# Patient Record
Sex: Male | Born: 1960 | Race: Black or African American | Hispanic: No | Marital: Single | Smoking: Current every day smoker
Health system: Southern US, Community
[De-identification: ages and names within clinical notes are randomized; demographics above are authoritative.]

## PROBLEM LIST (undated history)

## (undated) ENCOUNTER — Emergency Department (HOSPITAL_COMMUNITY): Payer: Medicare (Managed Care)

## (undated) DIAGNOSIS — I10 Essential (primary) hypertension: Secondary | ICD-10-CM

## (undated) DIAGNOSIS — N200 Calculus of kidney: Secondary | ICD-10-CM

## (undated) DIAGNOSIS — F209 Schizophrenia, unspecified: Secondary | ICD-10-CM

## (undated) DIAGNOSIS — E785 Hyperlipidemia, unspecified: Secondary | ICD-10-CM

## (undated) DIAGNOSIS — N189 Chronic kidney disease, unspecified: Secondary | ICD-10-CM

## (undated) DIAGNOSIS — K8020 Calculus of gallbladder without cholecystitis without obstruction: Secondary | ICD-10-CM

## (undated) DIAGNOSIS — I219 Acute myocardial infarction, unspecified: Secondary | ICD-10-CM

## (undated) DIAGNOSIS — I739 Peripheral vascular disease, unspecified: Secondary | ICD-10-CM

## (undated) DIAGNOSIS — E119 Type 2 diabetes mellitus without complications: Secondary | ICD-10-CM

## (undated) DIAGNOSIS — I2510 Atherosclerotic heart disease of native coronary artery without angina pectoris: Secondary | ICD-10-CM

## (undated) DIAGNOSIS — Z87448 Personal history of other diseases of urinary system: Secondary | ICD-10-CM

## (undated) HISTORY — DX: Type 2 diabetes mellitus without complications: E11.9

## (undated) HISTORY — DX: Essential (primary) hypertension: I10

## (undated) HISTORY — DX: Calculus of gallbladder without cholecystitis without obstruction: K80.20

## (undated) HISTORY — PX: OTHER SURGICAL HISTORY: SHX169

## (undated) HISTORY — DX: Calculus of kidney: N20.0

---

## 2007-07-06 ENCOUNTER — Other Ambulatory Visit: Payer: Self-pay

## 2007-07-07 ENCOUNTER — Other Ambulatory Visit: Payer: Self-pay

## 2007-07-07 ENCOUNTER — Inpatient Hospital Stay: Payer: Self-pay | Admitting: Vascular Surgery

## 2011-06-21 ENCOUNTER — Emergency Department: Payer: Self-pay | Admitting: Emergency Medicine

## 2011-06-21 LAB — COMPREHENSIVE METABOLIC PANEL
Anion Gap: 11 (ref 7–16)
BUN: 19 mg/dL — ABNORMAL HIGH (ref 7–18)
Bilirubin,Total: 0.5 mg/dL (ref 0.2–1.0)
Chloride: 105 mmol/L (ref 98–107)
Co2: 23 mmol/L (ref 21–32)
Creatinine: 1.34 mg/dL — ABNORMAL HIGH (ref 0.60–1.30)
EGFR (African American): >60
Osmolality: 283 (ref 275–301)
Potassium: 3.9 mmol/L (ref 3.5–5.1)
SGOT(AST): 13 U/L — ABNORMAL LOW (ref 15–37)
Sodium: 139 mmol/L (ref 136–145)
Total Protein: 7.5 g/dL (ref 6.4–8.2)

## 2011-06-21 LAB — CBC
HCT: 51.6 % (ref 40.0–52.0)
MCH: 29.5 pg (ref 26.0–34.0)
RDW: 15.6 % — ABNORMAL HIGH (ref 11.5–14.5)
WBC: 12.3 10*3/uL — ABNORMAL HIGH (ref 3.8–10.6)

## 2011-06-21 LAB — URINALYSIS, COMPLETE
Bilirubin,UR: NEGATIVE
Ph: 5 (ref 4.5–8.0)
Protein: 100
RBC,UR: 4 /HPF (ref 0–5)
Specific Gravity: 1.019 (ref 1.003–1.030)

## 2013-07-08 ENCOUNTER — Emergency Department: Payer: Self-pay | Admitting: Emergency Medicine

## 2013-07-08 LAB — TROPONIN I: Troponin-I: <0.02 ng/mL

## 2013-07-08 LAB — COMPREHENSIVE METABOLIC PANEL
ALT: 19 U/L (ref 12–78)
Albumin: 3.7 g/dL (ref 3.4–5.0)
Alkaline Phosphatase: 95 U/L
Anion Gap: 7 (ref 7–16)
BUN: 19 mg/dL — AB (ref 7–18)
Bilirubin,Total: 0.4 mg/dL (ref 0.2–1.0)
CHLORIDE: 108 mmol/L — AB (ref 98–107)
CREATININE: 1.46 mg/dL — AB (ref 0.60–1.30)
Calcium, Total: 9.2 mg/dL (ref 8.5–10.1)
Co2: 24 mmol/L (ref 21–32)
EGFR (African American): >60
GFR CALC NON AF AMER: 54 — AB
GLUCOSE: 63 mg/dL — AB (ref 65–99)
Osmolality: 278 (ref 275–301)
POTASSIUM: 3.9 mmol/L (ref 3.5–5.1)
SGOT(AST): 29 U/L (ref 15–37)
SODIUM: 139 mmol/L (ref 136–145)
Total Protein: 7.7 g/dL (ref 6.4–8.2)

## 2013-07-08 LAB — CBC WITH DIFFERENTIAL/PLATELET
Basophil #: 0.1 10*3/uL (ref 0.0–0.1)
Basophil %: 0.5 %
Eosinophil #: 0 10*3/uL (ref 0.0–0.7)
Eosinophil %: 0 %
HCT: 49.7 % (ref 40.0–52.0)
HGB: 16.8 g/dL (ref 13.0–18.0)
Lymphocyte #: 2.5 10*3/uL (ref 1.0–3.6)
Lymphocyte %: 19.8 %
MCH: 30.2 pg (ref 26.0–34.0)
MCHC: 33.8 g/dL (ref 32.0–36.0)
MCV: 90 fL (ref 80–100)
MONO ABS: 0.8 x10 3/mm (ref 0.2–1.0)
Monocyte %: 6.1 %
NEUTROS PCT: 73.6 %
Neutrophil #: 9.4 10*3/uL — ABNORMAL HIGH (ref 1.4–6.5)
Platelet: 175 10*3/uL (ref 150–440)
RBC: 5.55 10*6/uL (ref 4.40–5.90)
RDW: 16.2 % — ABNORMAL HIGH (ref 11.5–14.5)
WBC: 12.8 10*3/uL — ABNORMAL HIGH (ref 3.8–10.6)

## 2013-07-08 LAB — URINALYSIS, COMPLETE
Bilirubin,UR: NEGATIVE
Glucose,UR: NEGATIVE mg/dL (ref 0–75)
Ketone: NEGATIVE
NITRITE: NEGATIVE
Ph: 5 (ref 4.5–8.0)
Protein: NEGATIVE
RBC,UR: 1 /HPF (ref 0–5)
SPECIFIC GRAVITY: 1.01 (ref 1.003–1.030)
SQUAMOUS EPITHELIAL: NONE SEEN
WBC UR: 55 /HPF (ref 0–5)

## 2013-07-08 LAB — LIPASE, BLOOD: Lipase: 147 U/L (ref 73–393)

## 2013-11-20 ENCOUNTER — Encounter (HOSPITAL_COMMUNITY): Payer: Self-pay | Admitting: Emergency Medicine

## 2013-11-20 ENCOUNTER — Ambulatory Visit (HOSPITAL_COMMUNITY): Payer: Medicare Other

## 2013-11-20 ENCOUNTER — Encounter (HOSPITAL_COMMUNITY)
Admission: EM | Disposition: A | Discharge status: Home / Self Care | Payer: Medicare Other | Source: Other Acute Inpatient Hospital | Attending: Interventional Cardiology

## 2013-11-20 ENCOUNTER — Inpatient Hospital Stay (HOSPITAL_COMMUNITY)
Admission: EM | Admit: 2013-11-20 | Discharge: 2013-11-23 | DRG: 247 | Disposition: A | Discharge status: Home / Self Care | Payer: Medicare Other | Source: Other Acute Inpatient Hospital | Attending: Interventional Cardiology | Admitting: Interventional Cardiology

## 2013-11-20 DIAGNOSIS — Z7982 Long term (current) use of aspirin: Secondary | ICD-10-CM | POA: Diagnosis not present

## 2013-11-20 DIAGNOSIS — I2582 Chronic total occlusion of coronary artery: Secondary | ICD-10-CM | POA: Diagnosis present

## 2013-11-20 DIAGNOSIS — R6250 Unspecified lack of expected normal physiological development in childhood: Secondary | ICD-10-CM | POA: Diagnosis present

## 2013-11-20 DIAGNOSIS — I213 ST elevation (STEMI) myocardial infarction of unspecified site: Secondary | ICD-10-CM | POA: Diagnosis present

## 2013-11-20 DIAGNOSIS — I2510 Atherosclerotic heart disease of native coronary artery without angina pectoris: Secondary | ICD-10-CM | POA: Diagnosis present

## 2013-11-20 DIAGNOSIS — I498 Other specified cardiac arrhythmias: Secondary | ICD-10-CM | POA: Diagnosis present

## 2013-11-20 DIAGNOSIS — F17200 Nicotine dependence, unspecified, uncomplicated: Secondary | ICD-10-CM | POA: Diagnosis present

## 2013-11-20 DIAGNOSIS — I10 Essential (primary) hypertension: Secondary | ICD-10-CM | POA: Diagnosis present

## 2013-11-20 DIAGNOSIS — Z794 Long term (current) use of insulin: Secondary | ICD-10-CM | POA: Diagnosis not present

## 2013-11-20 DIAGNOSIS — E119 Type 2 diabetes mellitus without complications: Secondary | ICD-10-CM | POA: Diagnosis present

## 2013-11-20 DIAGNOSIS — F209 Schizophrenia, unspecified: Secondary | ICD-10-CM | POA: Diagnosis present

## 2013-11-20 DIAGNOSIS — I739 Peripheral vascular disease, unspecified: Secondary | ICD-10-CM | POA: Diagnosis present

## 2013-11-20 DIAGNOSIS — Z880 Allergy status to penicillin: Secondary | ICD-10-CM

## 2013-11-20 DIAGNOSIS — I2129 ST elevation (STEMI) myocardial infarction involving other sites: Principal | ICD-10-CM

## 2013-11-20 DIAGNOSIS — E785 Hyperlipidemia, unspecified: Secondary | ICD-10-CM | POA: Diagnosis present

## 2013-11-20 DIAGNOSIS — S78119A Complete traumatic amputation at level between unspecified hip and knee, initial encounter: Secondary | ICD-10-CM

## 2013-11-20 DIAGNOSIS — R079 Chest pain, unspecified: Secondary | ICD-10-CM | POA: Diagnosis present

## 2013-11-20 DIAGNOSIS — I2119 ST elevation (STEMI) myocardial infarction involving other coronary artery of inferior wall: Secondary | ICD-10-CM | POA: Diagnosis present

## 2013-11-20 DIAGNOSIS — Z720 Tobacco use: Secondary | ICD-10-CM | POA: Diagnosis present

## 2013-11-20 DIAGNOSIS — Z23 Encounter for immunization: Secondary | ICD-10-CM | POA: Diagnosis not present

## 2013-11-20 HISTORY — DX: Atherosclerotic heart disease of native coronary artery without angina pectoris: I25.10

## 2013-11-20 HISTORY — DX: Peripheral vascular disease, unspecified: I73.9

## 2013-11-20 HISTORY — DX: Hyperlipidemia, unspecified: E78.5

## 2013-11-20 HISTORY — DX: Personal history of other diseases of urinary system: Z87.448

## 2013-11-20 HISTORY — DX: Schizophrenia, unspecified: F20.9

## 2013-11-20 HISTORY — PX: LEFT HEART CATHETERIZATION WITH CORONARY ANGIOGRAM: SHX5451

## 2013-11-20 LAB — CBC
HCT: 42.6 % (ref 39.0–52.0)
Hemoglobin: 14.9 g/dL (ref 13.0–17.0)
MCH: 29.2 pg (ref 26.0–34.0)
MCHC: 35 g/dL (ref 30.0–36.0)
MCV: 83.5 fL (ref 78.0–100.0)
PLATELETS: 219 10*3/uL (ref 150–400)
RBC: 5.1 MIL/uL (ref 4.22–5.81)
RDW: 15.1 % (ref 11.5–15.5)
WBC: 13.9 10*3/uL — AB (ref 4.0–10.5)

## 2013-11-20 LAB — POCT I-STAT, CHEM 8
BUN: 17 mg/dL (ref 6–23)
CREATININE: 1.1 mg/dL (ref 0.50–1.35)
Calcium, Ion: 1.22 mmol/L (ref 1.12–1.23)
Chloride: 105 mEq/L (ref 96–112)
Glucose, Bld: 225 mg/dL — ABNORMAL HIGH (ref 70–99)
HCT: 47 % (ref 39.0–52.0)
HEMOGLOBIN: 16 g/dL (ref 13.0–17.0)
POTASSIUM: 3.5 meq/L — AB (ref 3.7–5.3)
SODIUM: 138 meq/L (ref 137–147)
TCO2: 22 mmol/L (ref 0–100)

## 2013-11-20 LAB — GLUCOSE, CAPILLARY: Glucose-Capillary: 145 mg/dL — ABNORMAL HIGH (ref 70–99)

## 2013-11-20 LAB — CREATININE, SERUM
CREATININE: 1.02 mg/dL (ref 0.50–1.35)
GFR calc Af Amer: >90 mL/min (ref >90)
GFR calc non Af Amer: 81 mL/min — ABNORMAL LOW (ref >90)

## 2013-11-20 LAB — I-STAT TROPONIN, ED: TROPONIN I, POC: 26.34 ng/mL — AB (ref 0.00–0.08)

## 2013-11-20 LAB — POCT ACTIVATED CLOTTING TIME
ACTIVATED CLOTTING TIME: 309 s
Activated Clotting Time: 214 seconds

## 2013-11-20 LAB — MRSA PCR SCREENING: MRSA BY PCR: NEGATIVE

## 2013-11-20 LAB — TROPONIN I: Troponin I: 19.6 ng/mL (ref <0.30)

## 2013-11-20 SURGERY — LEFT HEART CATHETERIZATION WITH CORONARY ANGIOGRAM
Anesthesia: LOCAL

## 2013-11-20 MED ORDER — CLOZAPINE 25 MG PO TABS
150.0000 mg | ORAL_TABLET | Freq: Every morning | ORAL | Status: DC
  Administered 2013-11-21 – 2013-11-23 (×3): 150 mg via ORAL
  Filled 2013-11-20 (×3): qty 2

## 2013-11-20 MED ORDER — ACETAMINOPHEN 325 MG PO TABS: 650.0000 mg | ORAL_TABLET | ORAL | Status: DC | PRN

## 2013-11-20 MED ORDER — HEPARIN SODIUM (PORCINE) 5000 UNIT/ML IJ SOLN
5000.0000 [IU] | Freq: Three times a day (TID) | INTRAMUSCULAR | Status: DC
  Administered 2013-11-21: 5000 [IU] via SUBCUTANEOUS
  Filled 2013-11-20 (×3): qty 1

## 2013-11-20 MED ORDER — MIDAZOLAM HCL 2 MG/2ML IJ SOLN
INTRAMUSCULAR | Status: AC
  Filled 2013-11-20: qty 2

## 2013-11-20 MED ORDER — SODIUM CHLORIDE 0.9 % IV SOLN
INTRAVENOUS | Status: AC
  Administered 2013-11-20: 18:00:00 via INTRAVENOUS

## 2013-11-20 MED ORDER — CLOZAPINE 100 MG PO TABS: 150.0000 mg | ORAL_TABLET | Freq: Two times a day (BID) | ORAL | Status: DC

## 2013-11-20 MED ORDER — TICAGRELOR 90 MG PO TABS
ORAL_TABLET | ORAL | Status: AC
  Filled 2013-11-20: qty 2

## 2013-11-20 MED ORDER — NITROGLYCERIN 0.4 MG SL SUBL: 0.4000 mg | SUBLINGUAL_TABLET | SUBLINGUAL | Status: DC | PRN

## 2013-11-20 MED ORDER — BENZTROPINE MESYLATE 1 MG PO TABS
1.0000 mg | ORAL_TABLET | Freq: Every day | ORAL | Status: DC
  Administered 2013-11-20 – 2013-11-22 (×3): 1 mg via ORAL
  Filled 2013-11-20 (×4): qty 1

## 2013-11-20 MED ORDER — ACETAMINOPHEN 325 MG PO TABS
650.0000 mg | ORAL_TABLET | ORAL | Status: DC | PRN
  Administered 2013-11-21 (×2): 650 mg via ORAL
  Filled 2013-11-20 (×2): qty 2

## 2013-11-20 MED ORDER — AMLODIPINE BESYLATE 2.5 MG PO TABS
2.5000 mg | ORAL_TABLET | Freq: Every day | ORAL | Status: DC
  Administered 2013-11-20: 2.5 mg via ORAL
  Filled 2013-11-20 (×2): qty 1

## 2013-11-20 MED ORDER — HEPARIN (PORCINE) IN NACL 2-0.9 UNIT/ML-% IJ SOLN
INTRAMUSCULAR | Status: AC
  Filled 2013-11-20: qty 500

## 2013-11-20 MED ORDER — INSULIN ASPART 100 UNIT/ML ~~LOC~~ SOLN
10.0000 [IU] | Freq: Three times a day (TID) | SUBCUTANEOUS | Status: DC
  Administered 2013-11-21 – 2013-11-22 (×4): 10 [IU] via SUBCUTANEOUS

## 2013-11-20 MED ORDER — TAMSULOSIN HCL 0.4 MG PO CAPS
0.4000 mg | ORAL_CAPSULE | Freq: Every day | ORAL | Status: DC
  Administered 2013-11-20 – 2013-11-23 (×4): 0.4 mg via ORAL
  Filled 2013-11-20 (×4): qty 1

## 2013-11-20 MED ORDER — PANTOPRAZOLE SODIUM 40 MG PO TBEC
40.0000 mg | DELAYED_RELEASE_TABLET | Freq: Every day | ORAL | Status: DC
  Administered 2013-11-20 – 2013-11-23 (×4): 40 mg via ORAL
  Filled 2013-11-20 (×4): qty 1

## 2013-11-20 MED ORDER — CLOZAPINE 100 MG PO TABS
500.0000 mg | ORAL_TABLET | Freq: Every evening | ORAL | Status: DC
  Administered 2013-11-20 – 2013-11-22 (×3): 500 mg via ORAL
  Filled 2013-11-20 (×4): qty 5

## 2013-11-20 MED ORDER — TIROFIBAN HCL IV 5 MG/100ML
0.1500 ug/kg/min | INTRAVENOUS | Status: AC
  Administered 2013-11-20: 0.15 ug/kg/min via INTRAVENOUS
  Filled 2013-11-20: qty 100

## 2013-11-20 MED ORDER — ATORVASTATIN CALCIUM 80 MG PO TABS
80.0000 mg | ORAL_TABLET | Freq: Every day | ORAL | Status: DC
  Administered 2013-11-21 – 2013-11-23 (×3): 80 mg via ORAL
  Filled 2013-11-20 (×3): qty 1

## 2013-11-20 MED ORDER — TIROFIBAN HCL IV 5 MG/100ML
INTRAVENOUS | Status: AC
  Filled 2013-11-20: qty 100

## 2013-11-20 MED ORDER — DOCUSATE SODIUM 100 MG PO CAPS
100.0000 mg | ORAL_CAPSULE | Freq: Every day | ORAL | Status: DC
  Administered 2013-11-21 – 2013-11-23 (×3): 100 mg via ORAL
  Filled 2013-11-20 (×3): qty 1

## 2013-11-20 MED ORDER — VERAPAMIL HCL 2.5 MG/ML IV SOLN
INTRAVENOUS | Status: AC
  Filled 2013-11-20: qty 2

## 2013-11-20 MED ORDER — INSULIN ASPART 100 UNIT/ML ~~LOC~~ SOLN
0.0000 [IU] | Freq: Three times a day (TID) | SUBCUTANEOUS | Status: DC
  Administered 2013-11-21: 2 [IU] via SUBCUTANEOUS
  Administered 2013-11-22: 3 [IU] via SUBCUTANEOUS
  Administered 2013-11-23: 5 [IU] via SUBCUTANEOUS

## 2013-11-20 MED ORDER — CLONAZEPAM 1 MG PO TABS
1.0000 mg | ORAL_TABLET | Freq: Every day | ORAL | Status: DC
  Administered 2013-11-20 – 2013-11-22 (×3): 1 mg via ORAL
  Filled 2013-11-20 (×3): qty 1

## 2013-11-20 MED ORDER — ONDANSETRON HCL 4 MG/2ML IJ SOLN: 4.0000 mg | Freq: Four times a day (QID) | INTRAMUSCULAR | Status: DC | PRN

## 2013-11-20 MED ORDER — HEPARIN SODIUM (PORCINE) 1000 UNIT/ML IJ SOLN
INTRAMUSCULAR | Status: AC
Start: 2013-11-20 — End: 2013-11-20
  Filled 2013-11-20: qty 1

## 2013-11-20 MED ORDER — MAGNESIUM HYDROXIDE 400 MG/5ML PO SUSP
30.0000 mL | Freq: Every day | ORAL | Status: DC | PRN
  Administered 2013-11-20: 30 mL via ORAL
  Filled 2013-11-20: qty 30

## 2013-11-20 MED ORDER — DULOXETINE HCL 60 MG PO CPEP
60.0000 mg | ORAL_CAPSULE | Freq: Every day | ORAL | Status: DC
  Administered 2013-11-20 – 2013-11-23 (×4): 60 mg via ORAL
  Filled 2013-11-20 (×4): qty 1

## 2013-11-20 MED ORDER — TICAGRELOR 90 MG PO TABS
90.0000 mg | ORAL_TABLET | Freq: Two times a day (BID) | ORAL | Status: DC
  Administered 2013-11-21 – 2013-11-23 (×5): 90 mg via ORAL
  Filled 2013-11-20 (×6): qty 1

## 2013-11-20 MED ORDER — INSULIN DETEMIR 100 UNIT/ML ~~LOC~~ SOLN
35.0000 [IU] | Freq: Every day | SUBCUTANEOUS | Status: DC
  Administered 2013-11-20 – 2013-11-21 (×2): 35 [IU] via SUBCUTANEOUS
  Filled 2013-11-20 (×4): qty 0.35

## 2013-11-20 MED ORDER — GEMFIBROZIL 600 MG PO TABS: 600.0000 mg | ORAL_TABLET | Freq: Two times a day (BID) | ORAL | Status: DC

## 2013-11-20 MED ORDER — PNEUMOCOCCAL VAC POLYVALENT 25 MCG/0.5ML IJ INJ
0.5000 mL | INJECTION | INTRAMUSCULAR | Status: AC
  Administered 2013-11-21: 0.5 mL via INTRAMUSCULAR
  Filled 2013-11-20: qty 0.5

## 2013-11-20 MED ORDER — METOPROLOL TARTRATE 12.5 MG HALF TABLET
12.5000 mg | ORAL_TABLET | Freq: Two times a day (BID) | ORAL | Status: DC
  Administered 2013-11-20 – 2013-11-23 (×6): 12.5 mg via ORAL
  Filled 2013-11-20 (×7): qty 1

## 2013-11-20 MED ORDER — LATANOPROST 0.005 % OP SOLN
1.0000 [drp] | Freq: Every day | OPHTHALMIC | Status: DC
  Administered 2013-11-20 – 2013-11-22 (×3): 1 [drp] via OPHTHALMIC
  Filled 2013-11-20: qty 2.5

## 2013-11-20 MED ORDER — FENTANYL CITRATE 0.05 MG/ML IJ SOLN
INTRAMUSCULAR | Status: AC
  Filled 2013-11-20: qty 2

## 2013-11-20 MED ORDER — INFLUENZA VAC SPLIT QUAD 0.5 ML IM SUSY
0.5000 mL | PREFILLED_SYRINGE | INTRAMUSCULAR | Status: AC
  Administered 2013-11-21: 0.5 mL via INTRAMUSCULAR
  Filled 2013-11-20: qty 0.5

## 2013-11-20 MED ORDER — ASPIRIN 81 MG PO CHEW
81.0000 mg | CHEWABLE_TABLET | Freq: Every day | ORAL | Status: DC
  Administered 2013-11-21 – 2013-11-23 (×3): 81 mg via ORAL
  Filled 2013-11-20 (×3): qty 1

## 2013-11-20 NOTE — CV Procedure (Addendum)
PROCEDURE:  Left heart catheterization with selective coronary angiography, Aspiration thrombectomy of the RCA; PCI RCA.  INDICATIONS:  Lateral STEMI  Emergency consent was obtained.  PROCEDURE TECHNIQUE:  After Xylocaine anesthesia a 22F slender sheath was placed in the right radial artery with a single anterior needle wall stick.  IV heparin was given. Right coronary angiography was done using a Judkins R4 guide catheter.  Left coronary angiography was done using an EBU 3.0 guide catheter.  Intervention performed. Please see below for details.  Left heart catheterization was done using the JR 4 catheter.  A TR band was used for hemostasis.  Of note, due to the patient's mental status and equivocal ECG, the patient record more evaluation in the emergency room before determining that this was an acute MI. This delayed the time before he can be brought to the Cath Lab.    CONTRAST:  Total of 110 cc.  COMPLICATIONS:  None.    HEMODYNAMICS:  Aortic pressure was 104/73; LV pressure was 106/12; LVEDP 17.  There was no gradient between the left ventricle and aorta.    ANGIOGRAPHIC DATA:   The left main coronary artery is widely patent.  The left anterior descending artery is a large vessel which wraps around the apex. There is minimal atherosclerosis in the LAD. There is a medium-sized first diagonal. There is one small branch off of the first diagonal which appears occluded, and fills by faint left to left collaterals.  The left circumflex artery is a large vessel. There is mild atherosclerosis in the mid vessel. There several small obtuse marginal vessels. There is one large, branching obtuse marginal vessel which is widely patent.  The right coronary artery is occluded in the midportion. After revascularization, it was noted that there is a medium-sized posterolateral artery. In a medium to large size posterior descending artery, both of which were widely patent.  LEFT VENTRICULOGRAM:   Left ventricular angiogram was not done.  LVEDP was 17 mmHg.  PCI NARRATIVE: A JR 4 guiding catheter was used to engage the RCA. A pro-water wire was placed across the area disease in the mid RCA. IV heparin and eventually IV tirofiban were used for anticoagulation. ACT was used to check that the anticoagulation was therapeutic. A priority 1 aspiration catheter was used to perform aspiration thrombectomy. There is successful removal of thrombus and restoration of flow. A 2.5 x 20 balloon was used to predilate. A 2.75 x 28 promus drug-eluting stent was deployed across the area disease. The stent was post dilated with a 3.25 x 15 noncompliant balloon. There is no residual stenosis.  Several doses of nitroglycerin were given. The patient tolerated the procedure well.  IMPRESSIONS:  1. Normal left main coronary artery. 2. Widely patent left anterior descending artery.  Trivial diagonal branch occluded. 3. Mild atherosclerosis in the left circumflex artery and its branches. 4. Occluded mid right coronary artery which was the culprit for his presentation. This was successfully treated with a 2.75 x 28 Promus drug-eluting stent, postdilated to 3.3 mm in diameter. 5. Left ventricular systolic function not assessed.  LVEDP 17 mmHg.    RECOMMENDATION:  Continue dual antiplatelet therapy for at least a year. He'll need aggressive secondary prevention including beta blocker, statin. He will be watched in the hospital for several days. We'll have to get his home medications restarted.  He will need cardiology followup. He has seen Dr. Christella Noa at [facility] many years ago, but apparently has not  seen anyone regularly.  Will need to determine followup based on where he is currently staying.

## 2013-11-20 NOTE — Progress Notes (Signed)
Dr. Eldridge Dace in room to see pt. Assessed right radial TR band and site. Site level 1 with small hematoma. Area soft- manual pressure held for 10 mins. Site softer. TR band in place with 15cc. 1+ radial pulse.

## 2013-11-20 NOTE — ED Notes (Signed)
PER EMS, patient has CP that started x2 weeks ago, ems reports elevation in lead 1 and AVL. Given 324 ASA and 1 nitro. Denies chest pain on arrival.

## 2013-11-20 NOTE — ED Notes (Signed)
Pt. Reports birthday is 05/26/1960, however insurance and facility paperwork state patient was born on Sep 30, 1960. Due to patient confusion, patient records will use the birthday of 1960/06/30.

## 2013-11-20 NOTE — H&P (Addendum)
Patient ID: Oscar Elliott MRN: [id number], DOB/AGE: [birth year]   Admit date: 11/20/2013   Primary Physician: No PCP Per Patient Primary Cardiologist: new - J. Eldridge Dace, MD   Pt. Profile:  53 y/o male w/o prior cardiac hx who presented with lateral STEMI.  Problem List  Past Medical History  Diagnosis Date  . Diabetes mellitus without complication   . Schizophrenia   . Hypertension   . CAD (coronary artery disease)     a. 10/2013 Lateral STEMI/PCI: LM nl, LAD min irregs, D1 occluded sub branch, LCX min irregs mid, OM large/patent, RCA 134m (2.75x28 Promus DES).  . Tobacco abuse   . Hyperlipidemia   . Peripheral vascular disease     a. 06/2007: s/p L AKA @ [facility]  . History of renal failure     a. 06/2007: in setting of rhabdo post L AKA.    Past Surgical History  Procedure Laterality Date  . Amputation Left     left above knee amputation     Allergies  Allergies  Allergen Reactions  . Penicillins Other (See Comments)    Reaction unknown    HPI  53 y/o male w/ the above problem list.  He does not have a prior cardiac history.  He has a h/o schizophrenia and lives in a group home.  Over the past few days, he has had severe 'indigestion,' which worsened today.  EMS was called and ECG was abnormal with lateral ST elevation.  Code STEMI was called and pt was taken to the cath lab where cath revealed an occluded RCA.  This was successfully stented with a DES.  Home Medications  Prior to Admission medications   Medication Sig Start Date End Date Taking? Authorizing Provider  amLODipine (NORVASC) 2.5 MG tablet Take 2.5 mg by mouth daily.   Yes Historical Provider, MD  aspirin EC 81 MG tablet Take 81 mg by mouth daily.   Yes Historical Provider, MD  atenolol (TENORMIN) 50 MG tablet Take 50 mg by mouth daily.   Yes Historical Provider, MD  benztropine (COGENTIN) 1 MG tablet Take 1 mg by mouth at bedtime.   Yes Historical Provider, MD  clonazePAM (KLONOPIN) 1 MG tablet Take  1 mg by mouth at bedtime.   Yes Historical Provider, MD  cloZAPine (CLOZARIL) 100 MG tablet Take 150-500 mg by mouth 2 (two) times daily. Take  by mouth in AM and  by mouth in PM   Yes Historical Provider, MD  docusate sodium (COLACE) 100 MG capsule Take 100 mg by mouth daily.   Yes Historical Provider, MD  DULoxetine (CYMBALTA) 60 MG capsule Take 60 mg by mouth daily.   Yes Historical Provider, MD  gemfibrozil (LOPID) 600 MG tablet Take 600 mg by mouth 2 (two) times daily before a meal.   Yes Historical Provider, MD  insulin aspart (NOVOLOG) 100 UNIT/ML injection Inject 10 Units into the skin 3 (three) times daily before meals.   Yes Historical Provider, MD  insulin detemir (LEVEMIR) 100 UNIT/ML injection Inject 35 Units into the skin at bedtime.   Yes Historical Provider, MD  latanoprost (XALATAN) 0.005 % ophthalmic solution Place 1 drop into both eyes at bedtime.   Yes Historical Provider, MD  loperamide (IMODIUM A-D) 2 MG tablet Take 2 mg by mouth 4 (four) times daily as needed for diarrhea or loose stools.   Yes Historical Provider, MD  omeprazole (PRILOSEC) 40 MG capsule Take 40 mg by mouth daily.   Yes Historical Provider, MD  tamsulosin (FLOMAX) 0.4 MG CAPS capsule Take 0.4 mg by mouth.   Yes Historical Provider, MD    Family History  Family History  Problem Relation Age of Onset  . Other      no premature CAD.    Social History  History   Social History  . Marital Status: Single    Spouse Name: N/A    Number of Children: N/A  . Years of Education: N/A   Occupational History  . Not on file.   Social History Main Topics  . Smoking status: Current Every Day Smoker -- 1.00 packs/day for 20 years    Types: Cigars, Cigarettes  . Smokeless tobacco: Not on file  . Alcohol Use: No  . Drug Use: No  . Sexual Activity: No   Other Topics Concern  . Not on file   Social History Narrative  . No narrative on file    Review of Systems General:  No chills, fever,  night sweats or weight changes.  Cardiovascular:  +++ chest pain, no dyspnea on exertion, edema, orthopnea, palpitations, paroxysmal nocturnal dyspnea. Dermatological: No rash, lesions/masses Respiratory: No cough, dyspnea Urologic: No hematuria, dysuria Abdominal:   No nausea, vomiting, diarrhea, bright red blood per rectum, melena, or hematemesis Neurologic:  No visual changes, wkns, changes in mental status. All other systems reviewed and are otherwise negative except as noted above.  Physical Exam  Blood pressure 124/83, pulse 88, temperature 98.5 F (36.9 C), temperature source Oral, resp. rate 24, weight 147 lb 11.3 oz (67 kg), SpO2 97.00%.  General: Pleasant, NAD Psych: Normal affect. Neuro: Alert and oriented X 3. Moves all extremities spontaneously. HEENT: Normal  Neck: Supple without bruits or JVD. Lungs:  Resp regular and unlabored, CTA. Heart: RRR no s3, s4, or murmurs. Abdomen: Soft, non-tender, non-distended, BS + x 4.  Extremities: No clubbing, cyanosis or edema. DP/PT/Radials 2+ and equal bilaterally.  Labs  Troponin [facility] Test)  Recent Labs  11/20/13 1552  TROPIPOC 26.34*    Lab Results  Component Value Date   HGB 16.0 11/20/2013   HCT 47.0 11/20/2013     Recent Labs Lab 11/20/13 1632  NA 138  K 3.5*  CL 105  BUN 17  CREATININE 1.10  GLUCOSE 225*   Radiology/Studies  Dg Chest Portable 1 View  11/20/2013   CLINICAL DATA:  STEMI; history of diabetes and hypertension and tobacco use.  EXAM: PORTABLE CHEST - 1 VIEW  COMPARISON:  None.  FINDINGS: Lungs are adequately inflated. The interstitial markings are mildly increased. The cardiopericardial silhouette is enlarged. The pulmonary vascularity is not clearly engorged. There is no pleural effusion or pneumothorax. The mediastinum is normal in width.  IMPRESSION: There is mild cardiomegaly without definite pulmonary edema.   Electronically Signed   By: David  [place]   On: 11/20/2013 16:05    ECG  Rsr, 88, pac, subtle ST elevation in I, II, aVL.  Inferolateral TWI.  ASSESSMENT AND PLAN  1. Acute lateral STEMI: s/p PCI/DES to the RCA.  Will add bb, statin, DAPT. Eventual rehab.  Check echo.  2.  HTN:  Stable.  3.  HL:  Add statin. F/U lipids/lft's.  4.  DMII:  Resume home meds.  Add SSI.  5.  Schizophrenia:  Resume home meds.  6.  Tob Abuse:  Cessation counseling.   Signed, Nicolasa Ducking, NP 11/20/2013, 6:43 PM  I have examined the patient and reviewed assessment and plan and discussed with patient.  Agree with above  as stated.   Lives in a home where his meds are managed for him.  Spoke to Catering manager of the facility who said there will be no problem getting him his meds.  Thus, DES was chosen.  Brilinta compatible with his other psych meds-verified with pharmacy.  Renea Schoonmaker S.

## 2013-11-20 NOTE — Progress Notes (Signed)
eLink Physician-Brief Progress Note Patient Name: [name] DOB: [birth year] MRN: [id number]   Date of Service  11/20/2013  HPI/Events of Note  ELINK evaluation new admission Evaluated through camera, tele, labs Appears well  eICU Interventions  No new interventions     Intervention Category Evaluation Type: New Patient Evaluation  Nelda Bucks. 11/20/2013, 7:19 PM

## 2013-11-20 NOTE — ED Notes (Signed)
Pt. Denies chest pain at this time. Intermittently reports sharp chest pain and abdominal pain.

## 2013-11-20 NOTE — ED Provider Notes (Signed)
CSN: [id number]     Arrival date & time 11/20/13  1540 History   None    Chief Complaint  Patient presents with  . Chest Pain     (Consider location/radiation/quality/duration/timing/severity/associated sxs/prior Treatment) Patient is a 53 y.o. male presenting with chest pain. The history is provided by the patient. The history is limited by a developmental delay (MR).  Chest Pain Pain location:  Substernal area Pain quality: aching and dull   Pain radiates to:  Does not radiate Pain radiates to the back: no   Pain severity:  Mild Onset quality:  Gradual Duration:  2 weeks Timing:  Intermittent Progression:  Resolved Chronicity:  New Context: at rest   Relieved by:  Nitroglycerin and aspirin (in EMS) Worsened by:  Nothing tried Ineffective treatments:  None tried Associated symptoms: no abdominal pain, no cough, no fever, no shortness of breath, not vomiting and no weakness   Risk factors: diabetes mellitus, hypertension, male sex, obesity and smoking     Past Medical History  Diagnosis Date  . Diabetes mellitus without complication   . Schizophrenia   . Hypertension    Past Surgical History  Procedure Laterality Date  . Amputation Left     left above knee amputation   No family history on file. History  Substance Use Topics  . Smoking status: Current Every Day Smoker  . Smokeless tobacco: Not on file  . Alcohol Use: Not on file    Review of Systems  Unable to perform ROS: Acuity of condition  Constitutional: Negative for fever.  Respiratory: Negative for cough and shortness of breath.   Cardiovascular: Positive for chest pain.  Gastrointestinal: Negative for vomiting and abdominal pain.  Neurological: Negative for weakness.      Allergies  Penicillins  Home Medications   Prior to Admission medications   Not on File   BP 116/79  Pulse 94  Temp(Src) 98.5 F (36.9 C)  Resp 30  SpO2 99% Physical Exam  Vitals reviewed. Constitutional: He is  oriented to person, place, and time. He appears well-developed and well-nourished. No distress.  HENT:  Head: Normocephalic and atraumatic.  Mouth/Throat: Oropharynx is clear and moist. No oropharyngeal exudate.  Eyes: Conjunctivae and EOM are normal. Pupils are equal, round, and reactive to light. Right eye exhibits no discharge. Left eye exhibits no discharge. No scleral icterus.  Neck: Normal range of motion. Neck supple.  Cardiovascular: Normal rate, regular rhythm, normal heart sounds and intact distal pulses.  Exam reveals no gallop and no friction rub.   No murmur heard. Pulmonary/Chest: Effort normal and breath sounds normal. No respiratory distress. He has no wheezes. He has no rales.  Abdominal: Soft. He exhibits no distension and no mass. There is no tenderness.  Musculoskeletal: Normal range of motion.  Neurological: He is alert and oriented to person, place, and time. No cranial nerve deficit. He exhibits normal muscle tone. Coordination normal.  Skin: Skin is warm. No rash noted. He is not diaphoretic.    ED Course  Procedures (including critical care time) Labs Review Labs Reviewed  I-STAT TROPOININ, ED - Abnormal; Notable for the following:    Troponin i, poc 26.34 (*)    All other components within normal limits    Imaging Review Dg Chest Portable 1 View  11/20/2013   CLINICAL DATA:  STEMI; history of diabetes and hypertension and tobacco use.  EXAM: PORTABLE CHEST - 1 VIEW  COMPARISON:  None.  FINDINGS: Lungs are adequately inflated. The interstitial markings  are mildly increased. The cardiopericardial silhouette is enlarged. The pulmonary vascularity is not clearly engorged. There is no pleural effusion or pneumothorax. The mediastinum is normal in width.  IMPRESSION: There is mild cardiomegaly without definite pulmonary edema.   Electronically Signed   By: David  [place]   On: 11/20/2013 16:05     EKG Interpretation None      MDM   MDM: 53 y.o. AAM w/ PMHx of  DM, HTN, Mental Retardation, Tobacco abuse w/ cc of chest pain. Hx limited as MR. Chest pain for 2 weeks. States feels like indigestion. Facility called out today d/t persistent pain. Unsure if changed. EKG en route shows elevation in I and AVL. STEMI called. ASA and NTG given with cessation of pain. Cards to evaluate. Pt HDS. Cards compared to old EKG. New elevation. Will admit and bring to cath lab.   Final diagnoses:  None    Admit to Cards   Pilar Jarvis, MD 11/20/13 [phone]

## 2013-11-20 NOTE — ED Notes (Signed)
i-stat Trop. result given to Dr. Radford Pax

## 2013-11-20 NOTE — Progress Notes (Signed)
11/20/13 1600  Clinical Encounter Type  Visited With Patient;Health care provider  Visit Type Initial;Code;ED  Spiritual Encounters  Spiritual Needs Emotional   Chaplain responded to a Code Stemi page at 3:34 PM. The patient was being worked on by the medical team but was able to speak. Chaplain asked the patient if he had any family that could be contacted. Patient explained that he has a sister who lives in [place]. Patient did not have his sister's number. Chaplain contacted the primary contact number in the patient's chart but that person was not available. One of the EMT persons indicated that the patient came from a group home called [facility]. Chaplain called the number of the group home and spoke to one of the Co-Administrators. The Co-Administrator of the home indicated that they did not have his sister's number on file but indicated that they did know about her. Co-Administrator of the home also indicated that the primary contact number of the patient belongs to the other Co-Administrator of the group home who was not on-duty at the moment. Patient is currently in the Cath Lab. Co-Administrator on phone said he would visit patient tomorrow if he is able. Chaplain will encourage another chaplain to follow-up with patient when he is transferred into an inpatient room. Lotoya Casella, Tommi Emery, Chaplain 4:20 PM

## 2013-11-21 DIAGNOSIS — Z720 Tobacco use: Secondary | ICD-10-CM | POA: Diagnosis present

## 2013-11-21 DIAGNOSIS — F209 Schizophrenia, unspecified: Secondary | ICD-10-CM | POA: Diagnosis present

## 2013-11-21 DIAGNOSIS — I2129 ST elevation (STEMI) myocardial infarction involving other sites: Secondary | ICD-10-CM | POA: Diagnosis not present

## 2013-11-21 DIAGNOSIS — I319 Disease of pericardium, unspecified: Secondary | ICD-10-CM

## 2013-11-21 DIAGNOSIS — I10 Essential (primary) hypertension: Secondary | ICD-10-CM | POA: Diagnosis present

## 2013-11-21 DIAGNOSIS — I739 Peripheral vascular disease, unspecified: Secondary | ICD-10-CM | POA: Diagnosis present

## 2013-11-21 LAB — GLUCOSE, CAPILLARY
GLUCOSE-CAPILLARY: 72 mg/dL (ref 70–99)
Glucose-Capillary: 103 mg/dL — ABNORMAL HIGH (ref 70–99)
Glucose-Capillary: 115 mg/dL — ABNORMAL HIGH (ref 70–99)
Glucose-Capillary: 121 mg/dL — ABNORMAL HIGH (ref 70–99)
Glucose-Capillary: 132 mg/dL — ABNORMAL HIGH (ref 70–99)

## 2013-11-21 LAB — COMPREHENSIVE METABOLIC PANEL
ALBUMIN: 2.5 g/dL — AB (ref 3.5–5.2)
ALT: 29 U/L (ref 0–53)
AST: 70 U/L — AB (ref 0–37)
Alkaline Phosphatase: 77 U/L (ref 39–117)
Anion gap: 14 (ref 5–15)
BILIRUBIN TOTAL: 0.5 mg/dL (ref 0.3–1.2)
BUN: 13 mg/dL (ref 6–23)
CHLORIDE: 104 meq/L (ref 96–112)
CO2: 22 meq/L (ref 19–32)
Calcium: 8.6 mg/dL (ref 8.4–10.5)
Creatinine, Ser: 0.84 mg/dL (ref 0.50–1.35)
GFR calc Af Amer: >90 mL/min (ref >90)
GFR calc non Af Amer: >90 mL/min (ref >90)
Glucose, Bld: 118 mg/dL — ABNORMAL HIGH (ref 70–99)
Potassium: 3.3 mEq/L — ABNORMAL LOW (ref 3.7–5.3)
Sodium: 140 mEq/L (ref 137–147)
Total Protein: 6.6 g/dL (ref 6.0–8.3)

## 2013-11-21 LAB — LIPID PANEL
CHOLESTEROL: 102 mg/dL (ref 0–200)
HDL: 27 mg/dL — ABNORMAL LOW (ref >39)
LDL Cholesterol: 62 mg/dL (ref 0–99)
TRIGLYCERIDES: 66 mg/dL (ref <150)
Total CHOL/HDL Ratio: 3.8 RATIO
VLDL: 13 mg/dL (ref 0–40)

## 2013-11-21 LAB — CBC
HCT: 40.9 % (ref 39.0–52.0)
Hemoglobin: 14.9 g/dL (ref 13.0–17.0)
MCH: 30.2 pg (ref 26.0–34.0)
MCHC: 36.4 g/dL — AB (ref 30.0–36.0)
MCV: 82.8 fL (ref 78.0–100.0)
Platelets: 221 10*3/uL (ref 150–400)
RBC: 4.94 MIL/uL (ref 4.22–5.81)
RDW: 15 % (ref 11.5–15.5)
WBC: 14.1 10*3/uL — ABNORMAL HIGH (ref 4.0–10.5)

## 2013-11-21 LAB — TROPONIN I
Troponin I: 16.33 ng/mL (ref <0.30)
Troponin I: >20 ng/mL (ref <0.30)

## 2013-11-21 LAB — CK TOTAL AND CKMB (NOT AT ARMC)
CK, MB: 19.7 ng/mL (ref 0.3–4.0)
Relative Index: 3.3 — ABNORMAL HIGH (ref 0.0–2.5)
Total CK: 601 U/L — ABNORMAL HIGH (ref 7–232)

## 2013-11-21 LAB — TSH: TSH: 1.49 u[IU]/mL (ref 0.350–4.500)

## 2013-11-21 MED ORDER — POTASSIUM CHLORIDE CRYS ER 20 MEQ PO TBCR
40.0000 meq | EXTENDED_RELEASE_TABLET | Freq: Once | ORAL | Status: AC
  Administered 2013-11-21: 40 meq via ORAL
  Filled 2013-11-21: qty 2

## 2013-11-21 MED ORDER — POTASSIUM CHLORIDE CRYS ER 20 MEQ PO TBCR
40.0000 meq | EXTENDED_RELEASE_TABLET | ORAL | Status: AC
  Administered 2013-11-21 (×3): 40 meq via ORAL
  Filled 2013-11-21 (×3): qty 2

## 2013-11-21 NOTE — Progress Notes (Signed)
EKG CRITICAL VALUE     12 lead EKG performed.  Critical value noted. Suzzette Righter, RN notified.   Amandamarie Feggins L, CCT 11/21/2013 9:09 AM

## 2013-11-21 NOTE — Progress Notes (Signed)
  Echocardiogram 2D Echocardiogram has been performed.  NEKHI, LIWANAG 11/21/2013, 11:51 AM

## 2013-11-21 NOTE — Progress Notes (Signed)
MD notified of CK and troponin levels, as well as EKG results.  Dr. Graciela Husbands at bedside.  Will continue to monitor patient closely.

## 2013-11-21 NOTE — ED Provider Notes (Signed)
I saw and evaluated the patient, reviewed the resident's note and I agree with the findings and plan.   .Face to face Exam:  General:  Awake HEENT:  Atraumatic Resp:  Normal effort Abd:  Nondistended Neuro:No focal weakness in  EKG was discussed and reviewed with resident  CRITICAL CARE Performed by: Nelva Nay L Total critical care time: 30 min Critical care time was exclusive of separately billable procedures and treating other patients. Critical care was necessary to treat or prevent imminent or life-threatening deterioration. Critical care was time spent personally by me on the following activities: development of treatment plan with patient and/or surrogate as well as nursing, discussions with consultants, evaluation of patient's response to treatment, examination of patient, obtaining history from patient or surrogate, ordering and performing treatments and interventions, ordering and review of laboratory studies, ordering and review of radiographic studies, pulse oximetry and re-evaluation of patient's condition.   Nelia Shi, MD 11/21/13 0001

## 2013-11-21 NOTE — Progress Notes (Signed)
Patient Name: Oscar Elliott      SUBJECTIVE: 35 male w hx of schizophrenia and dm per vas disease, with prior AKA  And hx of rhabdo assoc and now resolved  renal failure admitted yday with acute lateral MI  Rx DES RCA, without significant residual disease;  LVEDP 17, no LV gram done. Echo p  C/o chest pain this am,  Tn have been high  19>>16 ( 6 hrs post) and now >20  VS notable overnight for modestly low BP and HR in the 90-100s  K 3.3 this am   Some residual discomfort, but overall much better than last pm, a little nauseated   ECGs without change, pre, post procedure and this am   Past Medical History  Diagnosis Date  . Diabetes mellitus without complication   . Schizophrenia   . Hypertension   . CAD (coronary artery disease)     a. 10/2013 Lateral STEMI/PCI: LM nl, LAD min irregs, D1 occluded sub branch, LCX min irregs mid, OM large/patent, RCA 156m (2.75x28 Promus DES).  . Tobacco abuse   . Hyperlipidemia   . Peripheral vascular disease     a. 06/2007: s/p L AKA @ [facility]  . History of renal failure     a. 06/2007: in setting of rhabdo post L AKA.    Scheduled Meds:  Scheduled Meds: . amLODipine  2.5 mg Oral Daily  . aspirin  81 mg Oral Daily  . atorvastatin  80 mg Oral q1800  . benztropine  1 mg Oral QHS  . clonazePAM  1 mg Oral QHS  . cloZAPine  150 mg Oral q morning - 10a  . cloZAPine  500 mg Oral QPM  . docusate sodium  100 mg Oral Daily  . DULoxetine  60 mg Oral Daily  . heparin  5,000 Units Subcutaneous 3 times per day  . Influenza vac split quadrivalent PF  0.5 mL Intramuscular Tomorrow-1000  . insulin aspart  0-15 Units Subcutaneous TID WC  . insulin aspart  10 Units Subcutaneous TID AC  . insulin detemir  35 Units Subcutaneous QHS  . latanoprost  1 drop Both Eyes QHS  . metoprolol tartrate  12.5 mg Oral BID  . pantoprazole  40 mg Oral Daily  . pneumococcal 23 valent vaccine  0.5 mL Intramuscular Tomorrow-1000  . tamsulosin  0.4 mg Oral  Daily  . ticagrelor  90 mg Oral BID   Continuous Infusions:  acetaminophen, magnesium hydroxide, nitroGLYCERIN, ondansetron (ZOFRAN) IV    PHYSICAL EXAM Filed Vitals:   11/21/13 0500 11/21/13 0600 11/21/13 0700 11/21/13 0735  BP: 104/84 87/70 116/87   Pulse: 104 105 103 102  Temp:    98.9 F (37.2 C)  TempSrc:    Oral  Resp: Height:      Weight:      SpO2: 100% 99% 97% 98%   Well developed and nourished in no acute distress HENT normal Neck supple with JVP-flat Clear Regular rate and rhythm, no murmurs or gallops Abd-soft with active BS No Clubbing cyanosis edema Skin-warm and dry A & Oriented  Grossly normal sensory and motor function   TELEMETRY: Reviewed telemetry pt in  Sinus tach   Intake/Output Summary (Last 24 hours) at 11/21/13 1610 Last data filed at 11/21/13 0700  Gross per 24 hour  Intake 1162.97 ml  Output   1571 ml  Net -408.03 ml    LABS: Basic Metabolic Panel:  Recent Labs  Lab 11/20/13 1632 11/20/13 1930 11/21/13 0123  NA 138  --  140  K 3.5*  --  3.3*  CL 105  --  104  CO2  --   --  22  GLUCOSE 225*  --  118*  BUN 17  --  13  CREATININE 1.10 1.02 0.84  CALCIUM  --   --  8.6   Cardiac Enzymes:  Recent Labs  11/20/13 1950 11/21/13 0123 11/21/13 0625  CKTOTAL  --   --  601*  CKMB  --   --  19.7*  TROPONINI 19.60* 16.33* >20.00*   CBC:  Recent Labs Lab 11/20/13 1632 11/20/13 1930 11/21/13 0123  WBC  --  13.9* 14.1*  HGB 16.0 14.9 14.9  HCT 47.0 42.6 40.9  MCV  --  83.5 82.8  PLT  --  219 221   PROTIME: No results found for this basename: LABPROT, INR,  in the last 72 hours Liver Function Tests:  Recent Labs  11/21/13 0123  AST 70*  ALT 29  ALKPHOS 77  BILITOT 0.5  PROT 6.6  ALBUMIN 2.5*   No results found for this basename: LIPASE, AMYLASE,  in the last 72 hours BNP: BNP (last 3 results) No results found for this basename: PROBNP,  in the last 8760 hours D-Dimer: No results found for this  basename: DDIMER,  in the last 72 hours Hemoglobin A1C: No results found for this basename: HGBA1C,  in the last 72 hours Fasting Lipid Panel:  Recent Labs  11/21/13 0123  CHOL 102  HDL 27*  LDLCALC 62  TRIG 66  CHOLHDL 3.8   Thyroid Function Tests:  Recent Labs  11/21/13 0123  TSH 1.490   Anemia Panel:   ASSESSMENT AND PLAN:  Active Problems:   Acute myocardial infarction of other lateral wall, initial episode of care   Acute myocardial infarction of other inferior wall, initial episode of care   STEMI (ST elevation myocardial infarction)  Stable although hemodynamics not great Will give a little volume Replete K Keep in CCU Await echo SCDs instead of heparin   Signed, Sherryl Manges MD  11/21/2013

## 2013-11-21 NOTE — Progress Notes (Signed)
CARDIAC REHAB PHASE I   PRE:  Rate/Rhythm: 103 ST  BP:  Sitting: 114/84     SaO2: 97 RA  MODE:  Ambulation: 170 ft   POST:  Rate/Rhythm: 115 ST  BP:  Sitting: 118/83    SaO2: 98 RA  Pt in bed upon arrival.  Assisted pt in prosthesis on right leg.  Pt ambulated 170 feet with assist x2 and RW.  Pt tolerated walk well and did not have any c/o during ambulation.  Pt c/o mild limb numbness at location of prosthesis post ambulation.  Assisted pt to recliner post ambulation with call button and phone in reach.  Pt did not have any questions at this time. 0940  Marvene Staff MS, ACSM RCEP 9:37 AM 11/21/2013

## 2013-11-22 LAB — GLUCOSE, CAPILLARY
GLUCOSE-CAPILLARY: 163 mg/dL — AB (ref 70–99)
Glucose-Capillary: 95 mg/dL (ref 70–99)
Glucose-Capillary: 47 mg/dL — ABNORMAL LOW (ref 70–99)
Glucose-Capillary: 111 mg/dL — ABNORMAL HIGH (ref 70–99)
Glucose-Capillary: 195 mg/dL — ABNORMAL HIGH (ref 70–99)

## 2013-11-22 LAB — BASIC METABOLIC PANEL
ANION GAP: 13 (ref 5–15)
BUN: 19 mg/dL (ref 6–23)
CALCIUM: 9 mg/dL (ref 8.4–10.5)
CO2: 23 mEq/L (ref 19–32)
CREATININE: 1.11 mg/dL (ref 0.50–1.35)
Chloride: 105 mEq/L (ref 96–112)
GFR calc Af Amer: 85 mL/min — ABNORMAL LOW (ref >90)
GFR calc non Af Amer: 74 mL/min — ABNORMAL LOW (ref >90)
GLUCOSE: 114 mg/dL — AB (ref 70–99)
Potassium: 4.6 mEq/L (ref 3.7–5.3)
SODIUM: 141 meq/L (ref 137–147)

## 2013-11-22 MED ORDER — INSULIN DETEMIR 100 UNIT/ML ~~LOC~~ SOLN
20.0000 [IU] | Freq: Every day | SUBCUTANEOUS | Status: DC
Start: 2013-11-22 — End: 2013-11-23
  Administered 2013-11-22: 20 [IU] via SUBCUTANEOUS
  Filled 2013-11-22 (×2): qty 0.2

## 2013-11-22 NOTE — Progress Notes (Signed)
CRITICAL CARE Performed by: Excell Seltzer   Total critical care time: [facility]  Critical care time was exclusive of separately billable procedures and treating other patients.  Critical care was necessary to treat or prevent imminent or life-threatening deterioration.  Critical care was time spent personally by me on the following activities: development of treatment plan with patient and/or surrogate as well as nursing, discussions with consultants, evaluation of patient's response to treatment, examination of patient, obtaining history from patient or surrogate, ordering and performing treatments and interventions, ordering and review of laboratory studies, ordering and review of radiographic studies, pulse oximetry and re-evaluation of patient's condition.

## 2013-11-22 NOTE — Progress Notes (Addendum)
Patient Name: Oscar Elliott      SUBJECTIVE: 67 male w hx of schizophrenia and dm per vas disease, with prior AKA  And hx of rhabdo assoc and now resolved  renal failure admitted yday with acute lateral MI  Rx DES RCA, without significant residual disease;  LVEDP 17, no LV gram done. Echo p  C/o chest pain this am,  Tn have been high  19>>16 ( 6 hrs post) and now >20  VS notable overnight for modestly low BP and HR in the 90-100s  K 3.3 yesterdya and i forgot to reorder for today,  repleted  No chest pain, no sob   Echo 11/21/13<>> normal LVEF without RWMA  Small effusion without hemodynamic compromise   Past Medical History  Diagnosis Date  . Diabetes mellitus without complication   . Schizophrenia   . Hypertension   . CAD (coronary artery disease)     a. 10/2013 Lateral STEMI/PCI: LM nl, LAD min irregs, D1 occluded sub branch, LCX min irregs mid, OM large/patent, RCA 166m (2.75x28 Promus DES).  . Tobacco abuse   . Hyperlipidemia   . Peripheral vascular disease     a. 06/2007: s/p L AKA @ [facility]  . History of renal failure     a. 06/2007: in setting of rhabdo post L AKA.    Scheduled Meds:  Scheduled Meds: . aspirin  81 mg Oral Daily  . atorvastatin  80 mg Oral q1800  . benztropine  1 mg Oral QHS  . clonazePAM  1 mg Oral QHS  . cloZAPine  150 mg Oral q morning - 10a  . cloZAPine  500 mg Oral QPM  . docusate sodium  100 mg Oral Daily  . DULoxetine  60 mg Oral Daily  . insulin aspart  0-15 Units Subcutaneous TID WC  . insulin aspart  10 Units Subcutaneous TID AC  . insulin detemir  35 Units Subcutaneous QHS  . latanoprost  1 drop Both Eyes QHS  . metoprolol tartrate  12.5 mg Oral BID  . pantoprazole  40 mg Oral Daily  . tamsulosin  0.4 mg Oral Daily  . ticagrelor  90 mg Oral BID   Continuous Infusions:  acetaminophen, magnesium hydroxide, nitroGLYCERIN, ondansetron (ZOFRAN) IV    PHYSICAL EXAM Filed Vitals:   11/22/13 0500 11/22/13 0600 11/22/13  0700 11/22/13 0729  BP: 92/64 104/78  111/65  Pulse:    98  Temp:    98.7 F (37.1 C)  TempSrc:    Oral  Resp: Height:      Weight:   141 lb 15.6 oz (64.4 kg)   SpO2:    96%   Well developed and nourished in no acute distress HENT normal Neck supple with JVP-flat Clear Regular rate and rhythm, no murmurs or gallops Abd-soft with active BS No Clubbing cyanosis edema  L AKA Skin-warm and dry A & Oriented  Grossly normal sensory and motor function   TELEMETRY: Reviewed telemetry pt in  Sinus tach   Intake/Output Summary (Last 24 hours) at 11/22/13 0828 Last data filed at 11/22/13 0400  Gross per 24 hour  Intake    960 ml  Output    925 ml  Net     35 ml    LABS: Basic Metabolic Panel:  Recent Labs Lab 11/20/13 1632 11/20/13 1930 11/21/13 0123  NA 138  --  140  K 3.5*  --  3.3*  CL 105  --  104  CO2  --   --  22  GLUCOSE 225*  --  118*  BUN 17  --  13  CREATININE 1.10 1.02 0.84  CALCIUM  --   --  8.6   Cardiac Enzymes:  Recent Labs  11/20/13 1950 11/21/13 0123 11/21/13 0625  CKTOTAL  --   --  601*  CKMB  --   --  19.7*  TROPONINI 19.60* 16.33* >20.00*   CBC:  Recent Labs Lab 11/20/13 1632 11/20/13 1930 11/21/13 0123  WBC  --  13.9* 14.1*  HGB 16.0 14.9 14.9  HCT 47.0 42.6 40.9  MCV  --  83.5 82.8  PLT  --  219 221   PROTIME: No results found for this basename: LABPROT, INR,  in the last 72 hours Liver Function Tests:  Recent Labs  11/21/13 0123  AST 70*  ALT 29  ALKPHOS 77  BILITOT 0.5  PROT 6.6  ALBUMIN 2.5*   No results found for this basename: LIPASE, AMYLASE,  in the last 72 hours BNP: BNP (last 3 results) No results found for this basename: PROBNP,  in the last 8760 hours D-Dimer: No results found for this basename: DDIMER,  in the last 72 hours Hemoglobin A1C: No results found for this basename: HGBA1C,  in the last 72 hours Fasting Lipid Panel:  Recent Labs  11/21/13 0123  CHOL 102  HDL 27*  LDLCALC  62  TRIG 66  CHOLHDL 3.8   Thyroid Function Tests:  Recent Labs  11/21/13 0123  TSH 1.490   Anemia Panel:    ASSESSMENT AND PLAN:  Active Problems:   Acute myocardial infarction of other lateral wall, initial episode of care   Acute myocardial infarction of other inferior wall, initial episode of care   STEMI (ST elevation myocardial infarction)   Tobacco abuse   Peripheral vascular disease   Schizophrenia   Hypertension  Stable although hemodynamics not great  Keep in CCU   SCDs instead of heparin  Home am  Signed, Sherryl Manges MD  11/22/2013

## 2013-11-23 ENCOUNTER — Encounter (HOSPITAL_COMMUNITY): Payer: Self-pay | Admitting: Physician Assistant

## 2013-11-23 DIAGNOSIS — F209 Schizophrenia, unspecified: Secondary | ICD-10-CM

## 2013-11-23 DIAGNOSIS — F17200 Nicotine dependence, unspecified, uncomplicated: Secondary | ICD-10-CM

## 2013-11-23 DIAGNOSIS — I10 Essential (primary) hypertension: Secondary | ICD-10-CM

## 2013-11-23 LAB — GLUCOSE, CAPILLARY
Glucose-Capillary: 123 mg/dL — ABNORMAL HIGH (ref 70–99)
Glucose-Capillary: 246 mg/dL — ABNORMAL HIGH (ref 70–99)

## 2013-11-23 LAB — BASIC METABOLIC PANEL
ANION GAP: 13 (ref 5–15)
BUN: 18 mg/dL (ref 6–23)
CHLORIDE: 101 meq/L (ref 96–112)
CO2: 25 mEq/L (ref 19–32)
Calcium: 9.1 mg/dL (ref 8.4–10.5)
Creatinine, Ser: 1.23 mg/dL (ref 0.50–1.35)
GFR calc non Af Amer: 65 mL/min — ABNORMAL LOW (ref >90)
GFR, EST AFRICAN AMERICAN: 75 mL/min — AB (ref >90)
Glucose, Bld: 102 mg/dL — ABNORMAL HIGH (ref 70–99)
POTASSIUM: 4.4 meq/L (ref 3.7–5.3)
Sodium: 139 mEq/L (ref 137–147)

## 2013-11-23 MED ORDER — NITROGLYCERIN 0.4 MG SL SUBL: 0.4000 mg | SUBLINGUAL_TABLET | SUBLINGUAL | Status: DC | PRN

## 2013-11-23 MED ORDER — METOPROLOL TARTRATE 25 MG PO TABS
25.0000 mg | ORAL_TABLET | Freq: Two times a day (BID) | ORAL | Status: DC
  Filled 2013-11-23: qty 1

## 2013-11-23 MED ORDER — ATORVASTATIN CALCIUM 80 MG PO TABS: 80.0000 mg | ORAL_TABLET | Freq: Every day | ORAL | Status: DC

## 2013-11-23 MED ORDER — TICAGRELOR 90 MG PO TABS: 90.0000 mg | ORAL_TABLET | Freq: Two times a day (BID) | ORAL | Status: DC

## 2013-11-23 MED ORDER — METOPROLOL TARTRATE 25 MG PO TABS: 25.0000 mg | ORAL_TABLET | Freq: Two times a day (BID) | ORAL | Status: DC

## 2013-11-23 MED FILL — Heparin Sodium (Porcine) 2 Unit/ML in Sodium Chloride 0.9%: INTRAMUSCULAR | Qty: 1000 | Status: AC

## 2013-11-23 MED FILL — Lidocaine HCl Local Preservative Free (PF) Inj 1%: INTRAMUSCULAR | Qty: 30 | Status: AC

## 2013-11-23 MED FILL — Nitroglycerin IV Soln 200 MCG/ML in D5W: INTRAVENOUS | Qty: 1 | Status: AC

## 2013-11-23 NOTE — Discharge Summary (Signed)
Personally seen and examined. Agree with above. Clinically stable, post ST elevation myocardial infarction. Continue to administer medications at group home.  Donato Schultz, MD

## 2013-11-23 NOTE — Progress Notes (Signed)
Clinical Social Work Department BRIEF PSYCHOSOCIAL ASSESSMENT 11/23/2013  Patient:  Oscar Elliott, Oscar Elliott     Account Number:  [id number]     Admit date:  11/20/2013  Clinical Social Worker:  Harless Nakayama  Date/Time:  11/23/2013 11:00 AM  Referred by:  Physician  Date Referred:  11/23/2013 Referred for  Psychosocial assessment   Other Referral:   Interview type:  Other - See comment Other interview type:   Spoke with [facility]    PSYCHOSOCIAL DATA Living Status:  FACILITY Admitted from facility:  Other Level of care:  [facility] Primary support name:  Arlys John Primary support relationship to patient:  NONE Degree of support available:   Pt has limited support outide of [facility] Staff    CURRENT CONCERNS Current Concerns  Post-Acute Placement   Other Concerns:    SOCIAL WORK ASSESSMENT / PLAN CSW notified that pt was admitted from a group home. CSW called phone number for Arlys John in the chart. Lonnie informed CSW that he owns [facility] where pt resides. Lonnie informed CSW that pt cut ties with all family in the past and there is no one else that CSW can notify. Lonnie informed CSW that there will be no issues with pt returning. CSW notiifed that dc plan is for today. Lonnie confirmed he will not need an FL2 and dc paperwork will be all he needs. [facility] will be transporting pt and is agreeable to come up to pt room and go through dc paperwork with nursing staff to ensure group home staff is aware of any changes. CSW notified pt nurse and RNCM. Lonnie requesting to be notified when pt is ready for dc. CSW asked pt nurse to please call Lonnie when dc order is placed. CSW also spoke with pt and notiifed of plan for dc back to facility today. Pt expressed no concerns. At this time, pt has no further hospital social work needs. CSW signing off.   Assessment/plan status:  No Further Intervention Required Other assessment/ plan:    Information/referral to community resources:   None needed    PATIENT'S/FAMILY'S RESPONSE TO PLAN OF CARE: [facility] agreeable to pt returning.       Giovana Faciane, LCSWA [phone]

## 2013-11-23 NOTE — Discharge Instructions (Signed)
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  PLEASE REMEMBER TO BRING ALL OF YOUR MEDICATIONS TO EACH OF YOUR FOLLOW-UP OFFICE VISITS.  PLEASE ATTEND ALL SCHEDULED FOLLOW-UP APPOINTMENTS.   Activity: Increase activity slowly as tolerated. You may shower, but no soaking baths (or swimming) for 1 week. No driving for 1 week. No lifting over 5 lbs for 2 weeks. No sexual activity for 1 week.   You May Return to Work: in 3 weeks (if applicable)  Wound Care: You may wash cath site gently with soap and water. Keep cath site clean and dry. If you notice pain, swelling, bleeding or pus at your cath site, please call [phone].    Cardiac Cath Site Care Refer to this sheet in the next few weeks. These instructions provide you with information on caring for yourself after your procedure. Your caregiver may also give you more specific instructions. Your treatment has been planned according to current medical practices, but problems sometimes occur. Call your caregiver if you have any problems or questions after your procedure. HOME CARE INSTRUCTIONS  You may shower 24 hours after the procedure. Remove the bandage (dressing) and gently wash the site with plain soap and water. Gently pat the site dry.   Do not apply powder or lotion to the site.   Do not sit in a bathtub, swimming pool, or whirlpool for 5 to 7 days.   No bending, squatting, or lifting anything over 10 pounds (4.5 kg) as directed by your caregiver.   Inspect the site at least twice daily.   Do not drive home if you are discharged the same day of the procedure. Have someone else drive you.   You may drive 24 hours after the procedure unless otherwise instructed by your caregiver.  What to expect:  Any bruising will usually fade within 1 to 2 weeks.   Blood that collects in the tissue (hematoma) may be painful to the touch. It should usually decrease in size and tenderness within 1  to 2 weeks.  SEEK IMMEDIATE MEDICAL CARE IF:  You have unusual pain at the site or down the affected limb.   You have redness, warmth, swelling, or pain at the site.   You have drainage (other than a small amount of blood on the dressing).   You have chills.   You have a fever or persistent symptoms for more than 72 hours.   You have a fever and your symptoms suddenly get worse.   Your leg becomes pale, cool, tingly, or numb.   You have heavy bleeding from the site. Hold pressure on the site.  Document Released: 03/17/2010 Document Revised: 02/01/2011 Document Reviewed:    Myocardial Infarction A myocardial infarction (MI) is also called a heart attack. It causes damage to the heart that cannot be fixed. An MI often happens when a blood clot or other blockage cuts blood flow to the heart. When this happens, certain areas of the heart begin to die. This is an emergency. HOME CARE  Take medicine as told by your doctor.  Change certain behaviors as told by your doctor. This may include:  Quitting smoking.  Being active.  Keeping a healthy weight.  Eating a heart-healthy diet. Ask your doctor for help with this diet.  Keeping your diabetes under control.  Lessening stress.  Limiting how much alcohol you drink. GET HELP RIGHT AWAY IF:  You have crushing or pressure-like chest pain that  spreads to the arms, back, neck, or jaw. Call your local emergency services (911 in [place] Do not drive yourself to the hospital.  You have severe chest pain.  You have shortness of breath during rest, sleep, or with activity.  You have sudden sweating or clammy skin.  You feel sick to your stomach (nauseous) and throw up (vomit).  You suddenly get lightheaded or dizzy.  You feel your heart beating fast or skipping beats. MAKE SURE YOU:   Understand these instructions.  Will watch your condition.  Will get help right away if you are not doing well or get worse. Document  Released: 08/14/2011 Document Reviewed: 04/17/2013 [facility] Patient Information 2015 [place], [place]. This information is not intended to replace advice given to you by your health care provider. Make sure you discuss any questions you have with your health care provider.  Heart Disease Prevention Heart disease can lead to heart attacks and strokes. This is a leading cause of death. Heart disease can be inherited and can be caused from the lifestyle you lead. You can do a lot to keep your heart and blood vessels healthy.  WHAT SHOULD I DO EACH DAY TO KEEP MY HEART HEALTHY?  Do not smoke.  Follow a healthy eating plan as recommended by your caregiver or dietitian.  Be active for a total of 30 minutes most days. Ask your caregiver what activities are best for you.  Limit the amount of alcohol you drink.  Involve family and friends to help you with a healthy lifestyle. HOW DOES HEART DISEASE CAUSE HIGH BLOOD PRESSURE?  Narrowed blood vessels leave a smaller opening for blood to flow through. It is like turning on a garden hose and holding your thumb over the opening. The smaller opening makes the water shoot out with more pressure. In the same way, narrowed blood vessels can lead to high blood pressure. Other factors, such as kidney problems and being overweight, also can lead to high blood pressure.  If you have high blood pressure you may need to take blood pressure medicine every day. Some types of blood pressure medicine can also help keep your kidneys healthy.  Many people with diabetes also have high blood pressure. If you have heart, eye, or kidney problems from diabetes, high blood pressure can make them worse. HOW DO MY BLOOD VESSELS GET CLOGGED?  Cholesterol is a substance that is made by the body and used for many important functions. It is also found in food that comes from animals. When your cholesterol is high, it can stick to the insides of your blood vessels, making them  narrowed and even clogged. This problem is called atherosclerosis.  Narrowed and clogged blood vessels make it harder for blood to get to important body organs. This can cause problems such as:  Chest pain (angina). Angina can cause temporary pain in your chest, arms, shoulders, or back. You may feel the pain more when your heart beats faster, such as when you exercise. The pain may go away when you rest. You also may feel very weak and sweaty.  A heart attack. A heart attack happens when a blood vessel in or near the heart becomes blocked. Not enough blood is getting to the heart. During a heart attack, you may have chest pain in your chest, arms, shoulders, or back along with nausea, indigestion, extreme weakness, and sweating. WHAT CAN I DO TO PREVENT HEART DISEASE?   Keep your blood pressure under control as recommended by your  caregiver.  Keep your cholesterol under control. Have it checked at least once a year. Target cholesterol levels for most people are:  Total blood cholesterol level: Below 200.  LDL (bad) cholesterol: Below 100.  HDL (good) cholesterol: Above 40 in men and above 50 in women.  Triglycerides (another type of fat in the blood): Below 150.  Make physical activity a part of your daily routine. Check with your caregiver to learn what activities are best for you.  Make sure that the foods you eat are "heart-healthy."  Include foods high in fiber, such as oat bran, oatmeal, whole-grain breads and cereals.  Cut back on fried foods and foods high in saturated fat. This includes foods such as meats, butter, whole dairy products, shortening, and coconut or palm oil.  Avoid salty foods such as canned food, luncheon meat, salty snacks, and fast food.  Eat more fruits and vegetables.  Drink less alcohol.  Lose weight as recommended by your caregiver.  If you smoke, quit. Your caregiver can help you with quitting options.  Ask your caregiver whether you should take  a daily aspirin. Studies have shown that taking aspirin can help reduce your risk of heart disease and stroke.  Take your prescribed medicines as directed. WHAT ARE THE WARNING SIGNS OF A HEART ATTACK? You may have one or more of the following warning signs:  Chest pain or discomfort.  Pain or discomfort in your arms, back, jaw, or neck.  Indigestion or stomach pain.  Shortness of breath.  Sweating.  Nausea or vomiting.  Lightheadedness.  No warning signs at all or they may come and go. FOR MORE INFORMATION  To find out more about heart disease and stroke prevention, visit the American Heart Association website at www.americanheart.org Document Released: 09/27/2003 Document Revised: 08/14/2011 Document Reviewed: 04/08/2013 [facility] Patient Information 2015 [place], [place]. This information is not intended to replace advice given to you by your health care provider. Make sure you discuss any questions you have with your health care provider.  You Can Quit Smoking If you are ready to quit smoking or are thinking about it, congratulations! You have chosen to help yourself be healthier and live longer! There are lots of different ways to quit smoking. Nicotine gum, nicotine patches, a nicotine inhaler, or nicotine nasal spray can help with physical craving. Hypnosis, support groups, and medicines help break the habit of smoking. TIPS TO GET OFF AND STAY OFF CIGARETTES  Learn to predict your moods. Do not let a bad situation be your excuse to have a cigarette. Some situations in your life might tempt you to have a cigarette.  Ask friends and co-workers not to smoke around you.  Make your home smoke-free.  Never have "just one" cigarette. It leads to wanting another and another. Remind yourself of your decision to quit.  On a card, make a list of your reasons for not smoking. Read it at least the same number of times a day as you have a cigarette. Tell yourself everyday, "I do not  want to smoke. I choose not to smoke."  Ask someone at home or work to help you with your plan to quit smoking.  Have something planned after you eat or have a cup of coffee. Take a walk or get other exercise to perk you up. This will help to keep you from overeating.  Try a relaxation exercise to calm you down and decrease your stress. Remember, you may be tense and nervous the first two  weeks after you quit. This will pass.  Find new activities to keep your hands busy. Play with a pen, coin, or rubber band. Doodle or draw things on paper.  Brush your teeth right after eating. This will help cut down the craving for the taste of tobacco after meals. You can try mouthwash too.  Try gum, breath mints, or diet candy to keep something in your mouth. IF YOU SMOKE AND WANT TO QUIT:  Do not stock up on cigarettes. Never buy a carton. Wait until one pack is finished before you buy another.  Never carry cigarettes with you at work or at home.  Keep cigarettes as far away from you as possible. Leave them with someone else.  Never carry matches or a lighter with you.  Ask yourself, "Do I need this cigarette or is this just a reflex?"  Bet with someone that you can quit. Put cigarette money in a piggy bank every morning. If you smoke, you give up the money. If you do not smoke, by the end of the week, you keep the money.  Keep trying. It takes 21 days to change a habit!  Talk to your doctor about using medicines to help you quit. These include nicotine replacement gum, lozenges, or skin patches. Document Released: 12/09/2008 Document Revised: 05/07/2011 Document Reviewed: 12/09/2008 [facility] Patient Information 2015 [place], [place]. This information is not intended to replace advice given to you by your health care provider. Make sure you discuss any questions you have with your health care provider.  Smoking Cessation Quitting smoking is important to your health and has many advantages.  However, it is not always easy to quit since nicotine is a very addictive drug. Oftentimes, people try 3 times or more before being able to quit. This document explains the best ways for you to prepare to quit smoking. Quitting takes hard work and a lot of effort, but you can do it. ADVANTAGES OF QUITTING SMOKING  You will live longer, feel better, and live better.  Your body will feel the impact of quitting smoking almost immediately.  Within 20 minutes, blood pressure decreases. Your pulse returns to its normal level.  After 8 hours, carbon monoxide levels in the blood return to normal. Your oxygen level increases.  After 24 hours, the chance of having a heart attack starts to decrease. Your breath, hair, and body stop smelling like smoke.  After 48 hours, damaged nerve endings begin to recover. Your sense of taste and smell improve.  After 72 hours, the body is virtually free of nicotine. Your bronchial tubes relax and breathing becomes easier.  After 2 to 12 weeks, lungs can hold more air. Exercise becomes easier and circulation improves.  The risk of having a heart attack, stroke, cancer, or lung disease is greatly reduced.  After 1 year, the risk of coronary heart disease is cut in half.  After 5 years, the risk of stroke falls to the same as a nonsmoker.  After 10 years, the risk of lung cancer is cut in half and the risk of other cancers decreases significantly.  After 15 years, the risk of coronary heart disease drops, usually to the level of a nonsmoker.  If you are pregnant, quitting smoking will improve your chances of having a healthy baby.  The people you live with, especially any children, will be healthier.  You will have extra money to spend on things other than cigarettes. QUESTIONS TO THINK ABOUT BEFORE ATTEMPTING TO QUIT You may  want to talk about your answers with your health care provider.  Why do you want to quit?  If you tried to quit in the past, what  helped and what did not?  What will be the most difficult situations for you after you quit? How will you plan to handle them?  Who can help you through the tough times? Your family? Friends? A health care provider?  What pleasures do you get from smoking? What ways can you still get pleasure if you quit? Here are some questions to ask your health care provider:  How can you help me to be successful at quitting?  What medicine do you think would be best for me and how should I take it?  What should I do if I need more help?  What is smoking withdrawal like? How can I get information on withdrawal? GET READY  Set a quit date.  Change your environment by getting rid of all cigarettes, ashtrays, matches, and lighters in your home, car, or work. Do not let people smoke in your home.  Review your past attempts to quit. Think about what worked and what did not. GET SUPPORT AND ENCOURAGEMENT You have a better chance of being successful if you have help. You can get support in many ways.  Tell your family, friends, and coworkers that you are going to quit and need their support. Ask them not to smoke around you.  Get individual, group, or telephone counseling and support. Programs are available at [employer] and health centers. Call your local health department for information about programs in your area.  Spiritual beliefs and practices may help some smokers quit.  Download a "quit meter" on your computer to keep track of quit statistics, such as how long you have gone without smoking, cigarettes not smoked, and money saved.  Get a self-help book about quitting smoking and staying off tobacco. LEARN NEW SKILLS AND BEHAVIORS  Distract yourself from urges to smoke. Talk to someone, go for a walk, or occupy your time with a task.  Change your normal routine. Take a different route to work. Drink tea instead of coffee. Eat breakfast in a different place.  Reduce your stress. Take a  hot bath, exercise, or read a book.  Plan something enjoyable to do every day. Reward yourself for not smoking.  Explore interactive web-based programs that specialize in helping you quit. GET MEDICINE AND USE IT CORRECTLY Medicines can help you stop smoking and decrease the urge to smoke. Combining medicine with the above behavioral methods and support can greatly increase your chances of successfully quitting smoking.  Nicotine replacement therapy helps deliver nicotine to your body without the negative effects and risks of smoking. Nicotine replacement therapy includes nicotine gum, lozenges, inhalers, nasal sprays, and skin patches. Some may be available over-the-counter and others require a prescription.  Antidepressant medicine helps people abstain from smoking, but how this works is unknown. This medicine is available by prescription.  Nicotinic receptor partial agonist medicine simulates the effect of nicotine in your brain. This medicine is available by prescription. Ask your health care provider for advice about which medicines to use and how to use them based on your health history. Your health care provider will tell you what side effects to look out for if you choose to be on a medicine or therapy. Carefully read the information on the package. Do not use any other product containing nicotine while using a nicotine replacement product.  RELAPSE OR  DIFFICULT SITUATIONS Most relapses occur within the first 3 months after quitting. Do not be discouraged if you start smoking again. Remember, most people try several times before finally quitting. You may have symptoms of withdrawal because your body is used to nicotine. You may crave cigarettes, be irritable, feel very hungry, cough often, get headaches, or have difficulty concentrating. The withdrawal symptoms are only temporary. They are strongest when you first quit, but they will go away within 10-14 days. To reduce the chances of  relapse, try to:  Avoid drinking alcohol. Drinking lowers your chances of successfully quitting.  Reduce the amount of caffeine you consume. Once you quit smoking, the amount of caffeine in your body increases and can give you symptoms, such as a rapid heartbeat, sweating, and anxiety.  Avoid smokers because they can make you want to smoke.  Do not let weight gain distract you. Many smokers will gain weight when they quit, usually less than 10 pounds. Eat a healthy diet and stay active. You can always lose the weight gained after you quit.  Find ways to improve your mood other than smoking. FOR MORE INFORMATION  www.smokefree.gov  Document Released: 02/06/2001 Document Revised: 06/29/2013 Document Reviewed: 05/24/2011 [facility] Patient Information 2015 [place], [place]. This information is not intended to replace advice given to you by your health care provider. Make sure you discuss any questions you have with your health care provider.

## 2013-11-23 NOTE — Care Management Note (Signed)
    Page 1 of 1   11/23/2013     2:45:22 PM CARE MANAGEMENT NOTE 11/23/2013  Patient:  Oscar Elliott, Oscar Elliott   Account Number:  [id number]  Date Initiated:  11/23/2013  Documentation initiated by:  Donn Pierini  Subjective/Objective Assessment:   Pt admitted with STEMI     Action/Plan:   PTA pt lived in Group Home-   Anticipated DC Date:  11/23/2013   Anticipated DC Plan:  GROUP HOME  In-house referral  Clinical Social Worker      DC Planning Services  CM consult  Medication Assistance      Choice offered to / List presented to:             Status of service:  Completed, signed off Medicare Important Message given?  NA - LOS <3 / Initial given by admissions (If response is "NO", the following Medicare IM given date fields will be blank) Date Medicare IM given:   Medicare IM given by:   Date Additional Medicare IM given:   Additional Medicare IM given by:    Discharge Disposition:  HOME/SELF CARE  Per UR Regulation:  Reviewed for med. necessity/level of care/duration of stay  If discussed at Long Length of Stay Meetings, dates discussed:    Comments:  11/23/13- 1440- Donn Pierini RN, BSN [phone] Pt has Medicaid- no Medicare part D- [place] 30 day free card to be given to Group Home support person- [place]- Medicare IM signed by pt today through admissions. -

## 2013-11-23 NOTE — Discharge Summary (Signed)
CARDIOLOGY DISCHARGE SUMMARY   Patient ID: Arjay Jaskiewicz MRN: [id number] DOB/AGE: February 05, 1961 53 y.o.  Admit date: 11/20/2013 Discharge date: 11/23/2013  PCP: No PCP Per Patient Primary Cardiologist: Dr. Abe People initially, will follow up with Dr. Diona Browner in [place]  Primary Discharge Diagnosis:  Acute myocardial infarction of other inferior wall, initial episode of care -  2.75 x 28 Promus DES to the RCA  Secondary Discharge Diagnosis:    STEMI (ST elevation myocardial infarction)   Tobacco abuse   Peripheral vascular disease   Schizophrenia   Hypertension   Hypokalemia  Procedures: Left heart catheterization with selective coronary angiography, Aspiration thrombectomy of the RCA; PCI RCA, 2-D echocardiogram   Hospital Course: Dejuan Elman is a 53 y.o. male with no history of CAD. He had was initially thought to be indigestion but when his symptoms worsened, EMS was called and he had lateral ST elevation. He was transported emergently to the hospital and taken directly to the Cath Lab.  He had a drug-eluting stent to an occluded RCA, reducing the stenosis to 0. A very small diagonal branch was occluded and will be treated medically. He had an echocardiogram which showed an EF of 50-55%. He tolerated the procedures well. He had are dependent taking aspirin prior to admission as well as a beta blocker. His atenolol was changed to metoprolol because of his MI. He was also started on a statin. He tolerated the medication changes well.  He was seen by cardiac rehabilitation but struggles with ambulation because of an ill-fitting artificial leg. He is strongly encouraged to follow up with primary care/orthopedics for this. Smoking cessation was discussed and encouraged.  He was hypokalemic on admission with a potassium level of 3.3. This was supplemented and improved. This can be followed as an outpatient. His blood sugars were managed with a combination of her medications and  sliding scale insulin.  He lives in a rest home and will have assistance with his medications there. He states they make sure he takes all of his medications and provide transportation to his appointments. Because the home is in [facility], he will follow up in [place].  On 09/28, he was seen by Dr. Anne Fu and all data were reviewed. His beta blocker was up-titrated for better heart rate and blood pressure control. His amlodipine was discontinued but can be restarted if better blood pressure control is needed. No further inpatient workup was indicated and he is considered stable for discharge, to follow up as an outpatient.  Labs:   Lab Results  Component Value Date   WBC 14.1* 11/21/2013   HGB 14.9 11/21/2013   HCT 40.9 11/21/2013   MCV 82.8 11/21/2013   PLT 221 11/21/2013    Recent Labs Lab 11/21/13 0123  11/23/13 0415  NA 140  < > 139  K 3.3*  < > 4.4  CL 104  < > 101  CO2 22  < > 25  BUN 13  < > 18  CREATININE 0.84  < > 1.23  CALCIUM 8.6  < > 9.1  PROT 6.6  --   --   BILITOT 0.5  --   --   ALKPHOS 77  --   --   ALT 29  --   --   AST 70*  --   --   GLUCOSE 118*  < > 102*  < > = values in this interval not displayed.  Recent Labs  11/20/13 1950 11/21/13 0123 11/21/13 0625  CKTOTAL  --   --  601*  CKMB  --   --  19.7*  TROPONINI 19.60* 16.33* >20.00*   Lipid Panel     Component Value Date/Time   CHOL 102 11/21/2013 0123   TRIG 66 11/21/2013 0123   HDL 27* 11/21/2013 0123   CHOLHDL 3.8 11/21/2013 0123   VLDL 13 11/21/2013 0123   LDLCALC 62 11/21/2013 0123   Radiology: Dg Chest Portable 1 View 11/20/2013   CLINICAL DATA:  STEMI; history of diabetes and hypertension and tobacco use.  EXAM: PORTABLE CHEST - 1 VIEW  COMPARISON:  None.  FINDINGS: Lungs are adequately inflated. The interstitial markings are mildly increased. The cardiopericardial silhouette is enlarged. The pulmonary vascularity is not clearly engorged. There is no pleural effusion or pneumothorax. The  mediastinum is normal in width.  IMPRESSION: There is mild cardiomegaly without definite pulmonary edema.   Electronically Signed   By: David  [place]   On: 11/20/2013 16:05    Cardiac Cath: 11/20/2013 ANGIOGRAPHIC DATA: The left main coronary artery is widely patent.  The left anterior descending artery is a large vessel which wraps around the apex. There is minimal atherosclerosis in the LAD. There is a medium-sized first diagonal. There is one small branch off of the first diagonal which appears occluded, and fills by faint left to left collaterals.  The left circumflex artery is a large vessel. There is mild atherosclerosis in the mid vessel. There several small obtuse marginal vessels. There is one large, branching obtuse marginal vessel which is widely patent.  The right coronary artery is occluded in the midportion. After revascularization, it was noted that there is a medium-sized posterolateral artery. In a medium to large size posterior descending artery, both of which were widely patent.  LEFT VENTRICULOGRAM: Left ventricular angiogram was not done. LVEDP was 17 mmHg.  PCI NARRATIVE: A JR 4 guiding catheter was used to engage the RCA. A pro-water wire was placed across the area disease in the mid RCA. IV heparin and eventually IV tirofiban were used for anticoagulation. ACT was used to check that the anticoagulation was therapeutic. A priority 1 aspiration catheter was used to perform aspiration thrombectomy. There is successful removal of thrombus and restoration of flow. A 2.5 x 20 balloon was used to predilate. A 2.75 x 28 promus drug-eluting stent was deployed across the area disease. The stent was post dilated with a 3.25 x 15 noncompliant balloon. There is no residual stenosis. Several doses of nitroglycerin were given. The patient tolerated the procedure well.  IMPRESSIONS:  1. Normal left main coronary artery. 2. Widely patent left anterior descending artery. Trivial diagonal branch  occluded. 3. Mild atherosclerosis in the left circumflex artery and its branches. 4. Occluded mid right coronary artery which was the culprit for his presentation. This was successfully treated with a 2.75 x 28 Promus drug-eluting stent, postdilated to 3.3 mm in diameter. 5. Left ventricular systolic function not assessed. LVEDP 17 mmHg.  RECOMMENDATION: Continue dual antiplatelet therapy for at least a year. He'll need aggressive secondary prevention including beta blocker, statin. He will be watched in the hospital for several days. We'll have to get his home medications restarted.  EKG: 11/22/2013 Sinus rhythm, no critical ST elevation Vent. rate 94 BPM PR interval 160 ms QRS duration 80 ms QT/QTc 378/472 ms P-R-T axes 73 49 -15  Echo: 11/21/2013 Conclusion - Left ventricle: Hypokinesis of the basal inferior, inferolateral and inferoseptal walls. The cavity size was normal. There was moderate concentric hypertrophy. Systolic function was normal. The estimated  ejection fraction was in the range of 50% to 55%. Wall motion was normal; there were no regional wall motion abnormalities. Left ventricular diastolic function parameters were normal. - Aortic valve: Trileaflet; normal thickness leaflets. There was no regurgitation. - Left atrium: The atrium was normal in size. - Right ventricle: The cavity size was normal. Wall thickness was moderately increased. Systolic function was mildly reduced. - Tricuspid valve: There was mild regurgitation. - Pulmonic valve: There was no regurgitation. - Pulmonary arteries: Systolic pressure was within the normal range. - Pericardium, extracardiac: A mild circumferential pericardial effusion was identified. Features were not consistent with tamponade physiology. Impressions: - Low normal LV function with hypokinesis in the basal inferoseptal, inferior and inferolateral leads. Mildly reduced right ventricular systolic function.   FOLLOW UP  PLANS AND APPOINTMENTS Allergies  Allergen Reactions  . Penicillins Other (See Comments)    Reaction unknown     Medication List    STOP taking these medications       amLODipine 2.5 MG tablet  Commonly known as:  NORVASC     atenolol 50 MG tablet  Commonly known as:  TENORMIN      TAKE these medications       aspirin EC 81 MG tablet  Take 81 mg by mouth daily.     atorvastatin 80 MG tablet  Commonly known as:  LIPITOR  Take 1 tablet (80 mg total) by mouth daily at 6 PM.     benztropine 1 MG tablet  Commonly known as:  COGENTIN  Take 1 mg by mouth at bedtime.     clonazePAM 1 MG tablet  Commonly known as:  KLONOPIN  Take 1 mg by mouth at bedtime.     cloZAPine 100 MG tablet  Commonly known as:  CLOZARIL  Take 150-500 mg by mouth 2 (two) times daily. Take  by mouth in AM and  by mouth in PM     docusate sodium 100 MG capsule  Commonly known as:  COLACE  Take 100 mg by mouth daily.     DULoxetine 60 MG capsule  Commonly known as:  CYMBALTA  Take 60 mg by mouth daily.     gemfibrozil 600 MG tablet  Commonly known as:  LOPID  Take 600 mg by mouth 2 (two) times daily before a meal.     insulin aspart 100 UNIT/ML injection  Commonly known as:  novoLOG  Inject 10 Units into the skin 3 (three) times daily before meals.     insulin detemir 100 UNIT/ML injection  Commonly known as:  LEVEMIR  Inject 35 Units into the skin at bedtime.     latanoprost 0.005 % ophthalmic solution  Commonly known as:  XALATAN  Place 1 drop into both eyes at bedtime.     loperamide 2 MG tablet  Commonly known as:  IMODIUM A-D  Take 2 mg by mouth 4 (four) times daily as needed for diarrhea or loose stools.     metoprolol tartrate 25 MG tablet  Commonly known as:  LOPRESSOR  Take 1 tablet (25 mg total) by mouth 2 (two) times daily.     nitroGLYCERIN 0.4 MG SL tablet  Commonly known as:  NITROSTAT  Place 1 tablet (0.4 mg total) under the tongue every 5 (five) minutes x  3 doses as needed for chest pain.     omeprazole 40 MG capsule  Commonly known as:  PRILOSEC  Take 40 mg by mouth daily.     tamsulosin 0.4 MG  Caps capsule  Commonly known as:  FLOMAX  Take 0.4 mg by mouth.     ticagrelor 90 MG Tabs tablet  Commonly known as:  BRILINTA  Take 1 tablet (90 mg total) by mouth 2 (two) times daily.        Discharge Instructions   Diet - low sodium heart healthy    Complete by:  As directed      Diet Carb Modified    Complete by:  As directed      Increase activity slowly    Complete by:  As directed           Follow-up Information   Follow up with [name], PA-C On 12/02/2013. (See for Dr. Diona Browner at 11:40 am)    Specialty:  Cardiology   Contact information:   [address]  [place] [phone]       BRING ALL MEDICATIONS WITH YOU TO FOLLOW UP APPOINTMENTS  Time spent with patient to include physician time: 41 min Signed: Theodore Demark, PA-C 11/23/2013, 11:39 AM Co-Sign MD

## 2013-11-23 NOTE — Progress Notes (Signed)
CARDIAC REHAB PHASE I   PRE:  Rate/Rhythm: 92 SR  BP:  Supine:   Sitting: 106/64  Standing:    SaO2: 99 RA  MODE:  Ambulation: 180 ft   POST:  Rate/Rhythm: 106  BP:  Supine:   Sitting: 127/80  Standing:    SaO2: 100 RA 1010-1110 Assisted X 2 and used walker to ambulate. Gait steady with walker. Pt states that his prosthesis was not fitting correctly prior to hospitalization. He said that he had lost a lot of weight and that is the reason for it. He was able to walk 180 feet, but his prosthesis was moving around a lot. This made walking difficult for him. No c/o of cp or SOB walking. He did say that he was tired by end of walk. VS stable Pt to recliner after walk. Completed MI and stent education with pt. He voices understanding. I encouraged him to walk what he is able to. We discussed smoking cessation. I gave him tips for quitting, quit smart class information and coaching contact number. He states that he wants to quit.I will discuss his prosthesis with PA and case manager to see if there is something we can to start the process of getting his some help with this. Pt is not appropriate for Outpt. CRP due to prosthesis not fitting properly.   Melina Copa RN 11/23/2013 11:09 AM

## 2013-11-23 NOTE — Progress Notes (Addendum)
Patient Name: Oscar Elliott Date of Encounter: 11/23/2013  Principal Problem:   Acute myocardial infarction of other inferior wall, initial episode of care Active Problems:   STEMI (ST elevation myocardial infarction)   Tobacco abuse   Peripheral vascular disease   Schizophrenia   Hypertension    Patient Profile: 57 male w hx of schizophrenia, DM, periph vasc dz, 2009 [facility] assoc w/ rhabdo and renal failure, admitted 09/25 with acute lateral MI Rx DES RCA, without significant residual disease; LVEDP 17, no LV gram done. EF 50-55% by echo  SUBJECTIVE: Pt denies chest pain/SOB but has not ambulated much.  OBJECTIVE Filed Vitals:   11/22/13 1151 11/22/13 1800 11/22/13 2043 11/23/13 0513  BP: 111/73 98/66 103/58 131/62  Pulse: 94 100 100 95  Temp: 99 F (37.2 C) 98.9 F (37.2 C) 99.5 F (37.5 C) 98.8 F (37.1 C)  TempSrc: Oral Oral Oral Oral  Resp: Height:      Weight:    144 lb 4.8 oz (65.454 kg)  SpO2: 98% 95% 99% 95%    Intake/Output Summary (Last 24 hours) at 11/23/13 0900 Last data filed at 11/23/13 0840  Gross per 24 hour  Intake    960 ml  Output    901 ml  Net     59 ml   Filed Weights   11/21/13 0300 11/22/13 0700 11/23/13 0513  Weight: 141 lb 15.6 oz (64.4 kg) 141 lb 15.6 oz (64.4 kg) 144 lb 4.8 oz (65.454 kg)    PHYSICAL EXAM General: Well developed, well nourished, male in no acute distress. Head: Normocephalic, atraumatic.  Neck: Supple without bruits, JVD not elevated. Lungs:  Resp regular and unlabored, CTA. Heart: RRR, S1, S2, no S3, S4, ? murmur; + rub. Abdomen: Soft, non-tender, non-distended, BS + x 4.  Extremities: No clubbing, cyanosis, no edema.  Neuro: Alert and oriented X 3. Moves all extremities spontaneously. Psych: Normal affect.  LABS: CBC:  Recent Labs  11/20/13 1930 11/21/13 0123  WBC 13.9* 14.1*  HGB 14.9 14.9  HCT 42.6 40.9  MCV 83.5 82.8  PLT 219 221   Basic Metabolic Panel:  Recent Labs  82/95/62 0944 11/23/13 0415  NA 141 139  K 4.6 4.4  CL 105 101  CO2 23 25  GLUCOSE 114* 102*  BUN 19 18  CREATININE 1.11 1.23  CALCIUM 9.0 9.1   Liver Function Tests:  Recent Labs  11/21/13 0123  AST 70*  ALT 29  ALKPHOS 77  BILITOT 0.5  PROT 6.6  ALBUMIN 2.5*   Cardiac Enzymes:  Recent Labs  11/20/13 1950 11/21/13 0123 11/21/13 0625  CKTOTAL  --   --  601*  CKMB  --   --  19.7*  TROPONINI 19.60* 16.33* >20.00*    Recent Labs  11/20/13 1552  TROPIPOC 26.34*   Fasting Lipid Panel:  Recent Labs  11/21/13 0123  CHOL 102  HDL 27*  LDLCALC 62  TRIG 66  CHOLHDL 3.8   Thyroid Function Tests:  Recent Labs  11/21/13 0123  TSH 1.490    TELE: SR, 1 dropped beat, o/w no ectopy       ECHO: 11/21/2013  - Left ventricle: Hypokinesis of the basal inferior, inferolateral and inferoseptal walls. The cavity size was normal. There was moderate concentric hypertrophy. Systolic function was normal. The estimated ejection fraction was in the range of 50% to 55%. Wall motion was normal; there were no regional wall motion abnormalities. Left ventricular  diastolic function parameters were normal. - Aortic valve: Trileaflet; normal thickness leaflets. There was no regurgitation. - Left atrium: The atrium was normal in size. - Right ventricle: The cavity size was normal. Wall thickness was moderately increased. Systolic function was mildly reduced. - Tricuspid valve: There was mild regurgitation. - Pulmonic valve: There was no regurgitation. - Pulmonary arteries: Systolic pressure was within the normal range. - Pericardium, extracardiac: A mild circumferential pericardial effusion was identified. Features were not consistent with tamponade physiology. Impressions: - Low normal LV function with hypokinesis in the basal inferoseptal, inferior and inferolateral leads. Mildly reduced right ventricular systolic function.  Radiology/Studies: No results  found.   Current Medications:  . aspirin  81 mg Oral Daily  . atorvastatin  80 mg Oral q1800  . benztropine  1 mg Oral QHS  . clonazePAM  1 mg Oral QHS  . cloZAPine  150 mg Oral q morning - 10a  . cloZAPine  500 mg Oral QPM  . docusate sodium  100 mg Oral Daily  . DULoxetine  60 mg Oral Daily  . insulin aspart  0-15 Units Subcutaneous TID WC  . insulin detemir  20 Units Subcutaneous QHS  . latanoprost  1 drop Both Eyes QHS  . metoprolol tartrate  12.5 mg Oral BID  . pantoprazole  40 mg Oral Daily  . tamsulosin  0.4 mg Oral Daily  . ticagrelor  90 mg Oral BID      ASSESSMENT AND PLAN: Principal Problem:   Acute myocardial infarction of other inferior wall, initial episode of care - pt doing well, increase activity and see how tolerated. On ASA, BB, statin, DAPT. No increase in BB, but can change to Toprol XL 25 mg qd.   Active Problems:   STEMI (ST elevation myocardial infarction) - see above    Tobacco abuse - cessation recommended. Encourage [facility] to help w/ this    Peripheral vascular disease - no acute issues    Schizophrenia - on home rx    Hypertension - BP nl/low on current rx , no sx, follow    Rub on exam - asymptomatic, med changes per MD.  Plan - S.W. To see, possible d/c today  Signed, Theodore Demark , PA-C 9:00 AM 11/23/2013  Personally seen and examined. Agree with above. OK with DC today if SNF OK. DAPT one year EF 50% HR 90's. I increased bb. Metop 25 BID  Donato Schultz, MD

## 2013-11-23 NOTE — Progress Notes (Signed)
Reviewed discharge instructions with Lonnie patient's caregiver at [facility] and he stated his understanding.  Discharged home with caregiver via wheelchair.  Colman Cater

## 2013-12-02 ENCOUNTER — Encounter: Payer: Self-pay | Admitting: Physician Assistant

## 2013-12-02 ENCOUNTER — Ambulatory Visit (INDEPENDENT_AMBULATORY_CARE_PROVIDER_SITE_OTHER): Payer: Medicare Other | Admitting: Physician Assistant

## 2013-12-02 VITALS — BP 138/98 | HR 108 | Ht 63.0 in | Wt 150.0 lb

## 2013-12-02 DIAGNOSIS — E785 Hyperlipidemia, unspecified: Secondary | ICD-10-CM | POA: Insufficient documentation

## 2013-12-02 DIAGNOSIS — I213 ST elevation (STEMI) myocardial infarction of unspecified site: Secondary | ICD-10-CM

## 2013-12-02 DIAGNOSIS — Z720 Tobacco use: Secondary | ICD-10-CM

## 2013-12-02 DIAGNOSIS — I2119 ST elevation (STEMI) myocardial infarction involving other coronary artery of inferior wall: Secondary | ICD-10-CM

## 2013-12-02 DIAGNOSIS — I2510 Atherosclerotic heart disease of native coronary artery without angina pectoris: Secondary | ICD-10-CM | POA: Insufficient documentation

## 2013-12-02 DIAGNOSIS — Z9861 Coronary angioplasty status: Secondary | ICD-10-CM

## 2013-12-02 DIAGNOSIS — I10 Essential (primary) hypertension: Secondary | ICD-10-CM

## 2013-12-02 MED ORDER — METOPROLOL TARTRATE 50 MG PO TABS: 50.0000 mg | ORAL_TABLET | Freq: Two times a day (BID) | ORAL | Status: DC

## 2013-12-02 NOTE — Assessment & Plan Note (Signed)
Blood pressure is elevated. Recommend 2 g sodium diet. Increase metoprolol to 50 mg twice a day.

## 2013-12-02 NOTE — Progress Notes (Signed)
JXB:JYNW is a 53 year old male patient of Dr. Diona Browner who was admitted to the hospital with the STEMI treated with drug-eluting stent to the RCA 11/20/13. He had a widely patent LAD, trivial diagonal branch occluded and mild atherosclerosis in the left circumflex and its branches. LV systolic function was not assessed. 2-D echo showed EF to be 50-55% with hypokinesis of the basal inferior, inferolateral and inferior septal walls. There was a mild circumferential pericardial effusion identified not consistent with tamponade.  Patient also has history of peripheral vascular disease, hypertension, tobacco abuse and schizophrenia and lives in a group home.  Patient comes in accompanied by his caregiver. He denies any further chest pain. He smokes about 10 cigarettes daily. He is having trouble with his prosthetic leg and is in the process of being fitted for new one. His heart rate is up because he struggles so much to walk on this current prosthesis. He does very little walking at home. He does not use a wheelchair. He denies chest pain, palpitations, dyspnea, dyspnea on exertion, dizziness, or presyncope. His blood pressure is elevated. He says he likes to salt all his food.  Allergies  Allergen Reactions  . Penicillins Other (See Comments)    Reaction unknown     Current Outpatient Prescriptions  Medication Sig Dispense Refill  . aspirin EC 81 MG tablet Take 81 mg by mouth daily.      Marland Kitchen atorvastatin (LIPITOR) 80 MG tablet Take 1 tablet (80 mg total) by mouth daily at 6 PM.  30 tablet  11  . benztropine (COGENTIN) 1 MG tablet Take 1 mg by mouth at bedtime.      . clonazePAM (KLONOPIN) 1 MG tablet Take 1 mg by mouth at bedtime.      . cloZAPine (CLOZARIL) 100 MG tablet Take 150-500 mg by mouth 2 (two) times daily. Take 150mg  by mouth in AM and 500mg  by mouth in PM      . docusate sodium (COLACE) 100 MG capsule Take 100 mg by mouth daily.      . DULoxetine (CYMBALTA) 60 MG capsule Take  60 mg by mouth daily.      Marland Kitchen gemfibrozil (LOPID) 600 MG tablet Take 600 mg by mouth 2 (two) times daily before a meal.      . insulin aspart (NOVOLOG) 100 UNIT/ML injection Inject 10 Units into the skin 3 (three) times daily before meals.      . insulin detemir (LEVEMIR) 100 UNIT/ML injection Inject 35 Units into the skin at bedtime.      Marland Kitchen latanoprost (XALATAN) 0.005 % ophthalmic solution Place 1 drop into both eyes at bedtime.      Marland Kitchen loperamide (IMODIUM A-D) 2 MG tablet Take 2 mg by mouth 4 (four) times daily as needed for diarrhea or loose stools.      . metoprolol tartrate (LOPRESSOR) 25 MG tablet Take 1 tablet (25 mg total) by mouth 2 (two) times daily.  60 tablet  11  . nitroGLYCERIN (NITROSTAT) 0.4 MG SL tablet Place 1 tablet (0.4 mg total) under the tongue every 5 (five) minutes x 3 doses as needed for chest pain.  25 tablet  12  . omeprazole (PRILOSEC) 40 MG capsule Take 40 mg by mouth daily.      . tamsulosin (FLOMAX) 0.4 MG CAPS capsule Take 0.4 mg by mouth.      . ticagrelor (BRILINTA) 90 MG TABS tablet Take 1 tablet (90 mg total) by mouth 2 (two) times  daily.  60 tablet  11   No current facility-administered medications for this visit.    Past Medical History  Diagnosis Date  . Diabetes mellitus without complication   . Schizophrenia   . Hypertension   . CAD (coronary artery disease)     a. 10/2013 Lateral STEMI/PCI: LM nl, LAD min irregs, D1 occluded sub branch, LCX min irregs mid, OM large/patent, RCA 15820m (2.75x28 Promus DES).  . Tobacco abuse   . Hyperlipidemia   . Peripheral vascular disease     a. 06/2007: s/p L AKA @ [facility]  . History of renal failure     a. 06/2007: in setting of rhabdo post L AKA.    Past Surgical History  Procedure Laterality Date  . Amputation Left     left above knee amputation  . Coronary angioplasty with stent placement  11/20/2013     D1 sub-branch 100%, small vessel; RCA occluded, s/p 2.75 x 28 Promus DES    Family History  Problem  Relation Age of Onset  . Other      no premature CAD.    History   Social History  . Marital Status: Single    Spouse Name: N/A    Number of Children: N/A  . Years of Education: N/A   Occupational History  . Not on file.   Social History Main Topics  . Smoking status: Current Every Day Smoker -- 1.00 packs/day for 20 years    Types: Cigars, Cigarettes  . Smokeless tobacco: Not on file  . Alcohol Use: No  . Drug Use: No  . Sexual Activity: No   Other Topics Concern  . Not on file   Social History Narrative  . No narrative on file    ROS: See history of present illness otherwise negative  BP 138/98  Pulse 108  Ht 5\' 3"  (1.6 m)  Wt 150 lb (68.04 kg)  BMI 26.58 kg/m2  PHYSICAL EXAM: Well-nournished, in no acute distress. Neck: No JVD, HJR, Bruit, or thyroid enlargement  Lungs: Decreased breath sounds but No tachypnea, clear without wheezing, rales, or rhonchi  Cardiovascular: RRR, PMI not displaced, positive S4, no murmur, bruit, thrill, or heave.  Abdomen: BS normal. Soft without organomegaly, masses, lesions or tenderness.  Extremities: Right arm at cath site without hematoma or hemorrhage good radial and brachial pulses, otherwise lower extremitie without cyanosis, clubbing or edema. Good distal pulses on the right left above the knee of dictation on the left.  SKin: Warm, no lesions or rashes   Musculoskeletal: No deformities  Neuro: no focal signs   Wt Readings from Last 3 Encounters:  11/23/13 144 lb 4.8 oz (65.454 kg)  11/23/13 144 lb 4.8 oz (65.454 kg)     EKG: Echo: 11/21/2013 Conclusion - Left ventricle: Hypokinesis of the basal inferior, inferolateral and inferoseptal walls. The cavity size was normal. There was moderate concentric hypertrophy. Systolic function was normal. The estimated ejection fraction was in the range of 50% to 55%. Wall motion was normal; there were no regional wall motion abnormalities. Left ventricular diastolic  function parameters were normal. - Aortic valve: Trileaflet; normal thickness leaflets. There was no regurgitation. - Left atrium: The atrium was normal in size. - Right ventricle: The cavity size was normal. Wall thickness was moderately increased. Systolic function was mildly reduced. - Tricuspid valve: There was mild regurgitation. - Pulmonic valve: There was no regurgitation. - Pulmonary arteries: Systolic pressure was within the normal range. - Pericardium, extracardiac: A mild circumferential pericardial  effusion was identified. Features were not consistent with tamponade physiology. Impressions: - Low normal LV function with hypokinesis in the basal inferoseptal, inferior and inferolateral leads. Mildly reduced right ventricular systolic function.   Cardiac Cath: 11/20/2013 ANGIOGRAPHIC DATA: The left main coronary artery is widely patent.   The left anterior descending artery is a large vessel which wraps around the apex. There is minimal atherosclerosis in the LAD. There is a medium-sized first diagonal. There is one small branch off of the first diagonal which appears occluded, and fills by faint left to left collaterals.   The left circumflex artery is a large vessel. There is mild atherosclerosis in the mid vessel. There several small obtuse marginal vessels. There is one large, branching obtuse marginal vessel which is widely patent.   The right coronary artery is occluded in the midportion. After revascularization, it was noted that there is a medium-sized posterolateral artery. In a medium to large size posterior descending artery, both of which were widely patent.   LEFT VENTRICULOGRAM: Left ventricular angiogram was not done. LVEDP was 17 mmHg.   PCI NARRATIVE: A JR 4 guiding catheter was used to engage the RCA. A pro-water wire was placed across the area disease in the mid RCA. IV heparin and eventually IV tirofiban were used for anticoagulation. ACT was used to check that  the anticoagulation was therapeutic. A priority 1 aspiration catheter was used to perform aspiration thrombectomy. There is successful removal of thrombus and restoration of flow. A 2.5 x 20 balloon was used to predilate. A 2.75 x 28 promus drug-eluting stent was deployed across the area disease. The stent was post dilated with a 3.25 x 15 noncompliant balloon. There is no residual stenosis. Several doses of nitroglycerin were given. The patient tolerated the procedure well.   IMPRESSIONS:   1. Normal left main coronary artery. 2. Widely patent left anterior descending artery. Trivial diagonal branch occluded. 3. Mild atherosclerosis in the left circumflex artery and its branches. 4. Occluded mid right coronary artery which was the culprit for his presentation. This was successfully treated with a 2.75 x 28 Promus drug-eluting stent, postdilated to 3.3 mm in diameter. 5. Left ventricular systolic function not assessed. LVEDP 17 mmHg. RECOMMENDATION: Continue dual antiplatelet therapy for at least a year. He'll need aggressive secondary prevention including beta blocker, statin. He will be watched in the hospital for several days. We'll have to get his home medications restarted.

## 2013-12-02 NOTE — Addendum Note (Signed)
Addended by: Tenna DelaineBARKER, Jesiel Garate T on: 12/02/2013 02:23 PM   Modules accepted: Orders

## 2013-12-02 NOTE — Addendum Note (Signed)
Addended by: Tenna DelaineBARKER, Anna Livers T on: 12/02/2013 04:52 PM   Modules accepted: Orders

## 2013-12-02 NOTE — Assessment & Plan Note (Signed)
Patient is on Lopid and Lipitor. Check fasting lipid panel and LFTs in 6 weeks.

## 2013-12-02 NOTE — Assessment & Plan Note (Signed)
Patient suffered a STEMI treated with drug-eluting stent to the RCA. Currently on Brilinta and aspirin. He is tachycardic and his blood pressure is up today. Increase metoprolol to 50 mg twice a day. Followup with Dr. Diona BrownerMcDowell in 2 months. Check fasting lipid panel and LFTs in 6 weeks

## 2013-12-02 NOTE — Patient Instructions (Signed)
Your physician has recommended you make the following change in your medication:   Increase Lopressor to 50 mg twice daily  Your physician discussed the hazards of tobacco use. Tobacco use cessation is recommended and techniques and options to help you quit were discussed.  I have given you a low sodium diet to follow  [facility]!!

## 2013-12-02 NOTE — Assessment & Plan Note (Signed)
Smoking cessation discussed in detail. Patient would like to try the nicotine gum.

## 2014-01-13 ENCOUNTER — Ambulatory Visit (INDEPENDENT_AMBULATORY_CARE_PROVIDER_SITE_OTHER): Payer: Medicare Other | Admitting: Cardiology

## 2014-01-13 ENCOUNTER — Encounter: Payer: Self-pay | Admitting: Cardiology

## 2014-01-13 VITALS — BP 102/78 | HR 88 | Ht 63.0 in | Wt 150.0 lb

## 2014-01-13 DIAGNOSIS — Z720 Tobacco use: Secondary | ICD-10-CM

## 2014-01-13 DIAGNOSIS — I10 Essential (primary) hypertension: Secondary | ICD-10-CM

## 2014-01-13 DIAGNOSIS — I2119 ST elevation (STEMI) myocardial infarction involving other coronary artery of inferior wall: Secondary | ICD-10-CM

## 2014-01-13 DIAGNOSIS — E785 Hyperlipidemia, unspecified: Secondary | ICD-10-CM

## 2014-01-13 DIAGNOSIS — I2510 Atherosclerotic heart disease of native coronary artery without angina pectoris: Secondary | ICD-10-CM

## 2014-01-13 MED ORDER — NICOTINE POLACRILEX 4 MG MT GUM: 4.0000 mg | CHEWING_GUM | OROMUCOSAL | Status: DC | PRN

## 2014-01-13 NOTE — Assessment & Plan Note (Signed)
Blood pressure much better controlled today. No additional changes made to current regimen.

## 2014-01-13 NOTE — Assessment & Plan Note (Signed)
Symptomatically stable at this time status post lateral STEMI in September with placement of DES to an occluded RCA. Plan is to continue medical therapy including the DAPT. Follow-up arranged in the next 3 months.

## 2014-01-13 NOTE — Progress Notes (Signed)
Reason for visit: CAD, hypertension, hyperlipidemia  Clinical Summary Oscar Elliott is a 53 y.o.male last seen in the office by Ms. Oscar Elliott in October. He was assigned to follow up with me as his cardiology provider, I have actually never met him before. I reviewed his hospital records and history outlined below. He is here today with the owner of the living facility where he resides. He does not report any angina symptoms or nitroglycerin use. He has been trying to stop smoking, has slipped up a few times in the last week. Having intermittent headaches, no visual changes. I reviewed his cardiac medications.  At the last visit, beta blocker dose was increased and sodium restriction discussed for further management of hypertension. His blood pressure is much better controlled today.  He is due for follow-up FLP and LFTs. These will be arranged in the next few weeks. He continues on high-dose Lipitor.  Echocardiogram are reported LVEF 50-55% with inferolateral wall motion abnormalities, moderate LVH, normal diastolic parameters, mildly reduced RV contraction, mild tricuspid regurgitation, small circumferential pericardial effusion.   Allergies  Allergen Reactions  . Penicillins Other (See Comments)    Reaction unknown    Current Outpatient Prescriptions  Medication Sig Dispense Refill  . amLODipine (NORVASC) 5 MG tablet Take 5 mg by mouth daily.    Marland Kitchen aspirin EC 81 MG tablet Take 81 mg by mouth daily.    Marland Kitchen atenolol (TENORMIN) 50 MG tablet Take 50 mg by mouth 2 (two) times daily.    Marland Kitchen atorvastatin (LIPITOR) 80 MG tablet Take 1 tablet (80 mg total) by mouth daily at 6 PM. 30 tablet 11  . benztropine (COGENTIN) 1 MG tablet Take 1 mg by mouth at bedtime.    . clonazePAM (KLONOPIN) 1 MG tablet Take 1 mg by mouth at bedtime.    . cloZAPine (CLOZARIL) 100 MG tablet Take 150-500 mg by mouth 2 (two) times daily. Take 122m by mouth in AM and 5040mby mouth in PM    . docusate sodium (COLACE)  100 MG capsule Take 100 mg by mouth daily.    . DULoxetine (CYMBALTA) 60 MG capsule Take 60 mg by mouth daily.    . Marland Kitchenemfibrozil (LOPID) 600 MG tablet Take 600 mg by mouth 2 (two) times daily before a meal.    . insulin aspart (NOVOLOG) 100 UNIT/ML injection Inject 10 Units into the skin 3 (three) times daily before meals.    . insulin detemir (LEVEMIR) 100 UNIT/ML injection Inject 35 Units into the skin at bedtime.    . Marland Kitchenatanoprost (XALATAN) 0.005 % ophthalmic solution Place 1 drop into both eyes at bedtime.    . Marland Kitchenoperamide (IMODIUM A-D) 2 MG tablet Take 2 mg by mouth 4 (four) times daily as needed for diarrhea or loose stools.    . metoprolol (LOPRESSOR) 50 MG tablet Take 1 tablet (50 mg total) by mouth 2 (two) times daily. 180 tablet 3  . nitroGLYCERIN (NITROSTAT) 0.4 MG SL tablet Place 1 tablet (0.4 mg total) under the tongue every 5 (five) minutes x 3 doses as needed for chest pain. 25 tablet 12  . omeprazole (PRILOSEC) 40 MG capsule Take 40 mg by mouth daily.    . tamsulosin (FLOMAX) 0.4 MG CAPS capsule Take 0.4 mg by mouth.    . ticagrelor (BRILINTA) 90 MG TABS tablet Take 1 tablet (90 mg total) by mouth 2 (two) times daily. 60 tablet 11  . nicotine polacrilex (EQ NICOTINE) 4 MG gum Take 1  each (4 mg total) by mouth as needed for smoking cessation. 100 tablet 0   No current facility-administered medications for this visit.    Past Medical History  Diagnosis Date  . Type 2 diabetes mellitus   . Schizophrenia   . Essential hypertension   . CAD (coronary artery disease)     a. 10/2013 Lateral STEMI/PCI: LM nl, LAD min irregs, D1 occluded sub branch, LCX min irregs mid, OM large/patent, RCA 186m(2.75x28 Promus DES).  . Hyperlipidemia   . Peripheral vascular disease     a. 06/2007: s/p L AKA @ [facility]  . History of renal failure     a. 06/2007: in setting of rhabdo post L AKA.    Past Surgical History  Procedure Laterality Date  . Above-the-knee amputation Left     Social History Mr.  WSickingerreports that he quit smoking about 4 weeks ago. His smoking use included Cigars and Cigarettes. He has a 20 pack-year smoking history. He has never used smokeless tobacco. Mr. WLiskareports that he does not drink alcohol.  Review of Systems Complete review of systems negative except as otherwise outlined in the clinical summary and also the following. Reports no bleeding problems. Stable appetite. Uses a walker to ambulate with leg prosthesis.  Physical Examination Filed Vitals:   01/13/14 1023  BP: 102/78  Pulse: 88   Filed Weights   01/13/14 1023  Weight: 150 lb (68.04 kg)   Patient appears comfortable at rest. HEENT: Conjunctiva and lids normal, oropharynx clear. Neck: Supple, no elevated JVP or carotid bruits, no thyromegaly. Lungs: Clear to auscultation, nonlabored breathing at rest. Cardiac: Regular rate and rhythm, no S3 or significant systolic murmur, no pericardial rub. Abdomen: Soft, nontender, bowel sounds present. Extremities: No pitting edema, distal pulses 2+. Skin: Warm and dry. Musculoskeletal: No kyphosis. Neuropsychiatric: Alert and oriented x3, affect grossly appropriate.   Problem List and Plan   Coronary artery disease involving native heart without angina pectoris Symptomatically stable at this time status post lateral STEMI in September with placement of DES to an occluded RCA. Plan is to continue medical therapy including the DAPT. Follow-up arranged in the next 3 months.  Tobacco abuse He is actively working on quitting smoking. Nicotine gum, should be okay for him to use at this point.  Hyperlipidemia He continues on high-dose Lipitor. Follow-up FLP and LFTs to be arranged.  Essential hypertension Blood pressure much better controlled today. No additional changes made to current regimen.    SSatira Sark M.D., F.A.C.C.

## 2014-01-13 NOTE — Assessment & Plan Note (Signed)
He is actively working on quitting smoking. Nicotine gum, should be okay for him to use at this point.

## 2014-01-13 NOTE — Addendum Note (Signed)
Addended by: Burman NievesASHWORTH, Elric Tirado T on: 01/13/2014 04:01 PM   Modules accepted: Orders

## 2014-01-13 NOTE — Assessment & Plan Note (Signed)
He continues on high-dose Lipitor. Follow-up FLP and LFTs to be arranged.

## 2014-01-13 NOTE — Patient Instructions (Signed)
Your physician recommends that you schedule a follow-up appointment in: 3-4 months with Dr. Diona BrownerMcDowell  Your physician recommends that you continue on your current medications as directed. Please refer to the Current Medication list given to you today.  Your physician recommends that you return for lab work Lipids/Liver  Start Nicotine Gum as directed   [facility] choosing [facility]!!

## 2014-02-04 ENCOUNTER — Encounter (HOSPITAL_COMMUNITY): Payer: Self-pay | Admitting: Interventional Cardiology

## 2014-02-22 ENCOUNTER — Inpatient Hospital Stay (HOSPITAL_COMMUNITY)
Admission: EM | Admit: 2014-02-22 | Discharge: 2014-02-26 | DRG: 372 | Payer: Medicare Other | Attending: Internal Medicine | Admitting: Internal Medicine

## 2014-02-22 ENCOUNTER — Emergency Department (HOSPITAL_COMMUNITY): Payer: Medicare Other

## 2014-02-22 ENCOUNTER — Encounter (HOSPITAL_COMMUNITY): Payer: Self-pay | Admitting: Emergency Medicine

## 2014-02-22 DIAGNOSIS — R112 Nausea with vomiting, unspecified: Secondary | ICD-10-CM | POA: Diagnosis present

## 2014-02-22 DIAGNOSIS — I2510 Atherosclerotic heart disease of native coronary artery without angina pectoris: Secondary | ICD-10-CM | POA: Diagnosis present

## 2014-02-22 DIAGNOSIS — E11649 Type 2 diabetes mellitus with hypoglycemia without coma: Secondary | ICD-10-CM | POA: Diagnosis present

## 2014-02-22 DIAGNOSIS — I252 Old myocardial infarction: Secondary | ICD-10-CM | POA: Diagnosis not present

## 2014-02-22 DIAGNOSIS — Z89612 Acquired absence of left leg above knee: Secondary | ICD-10-CM | POA: Diagnosis not present

## 2014-02-22 DIAGNOSIS — A047 Enterocolitis due to Clostridium difficile: Secondary | ICD-10-CM | POA: Diagnosis not present

## 2014-02-22 DIAGNOSIS — A0472 Enterocolitis due to Clostridium difficile, not specified as recurrent: Secondary | ICD-10-CM | POA: Diagnosis present

## 2014-02-22 DIAGNOSIS — Z7982 Long term (current) use of aspirin: Secondary | ICD-10-CM | POA: Diagnosis not present

## 2014-02-22 DIAGNOSIS — F209 Schizophrenia, unspecified: Secondary | ICD-10-CM | POA: Diagnosis present

## 2014-02-22 DIAGNOSIS — E876 Hypokalemia: Secondary | ICD-10-CM | POA: Diagnosis present

## 2014-02-22 DIAGNOSIS — R7989 Other specified abnormal findings of blood chemistry: Secondary | ICD-10-CM | POA: Diagnosis present

## 2014-02-22 DIAGNOSIS — K567 Ileus, unspecified: Secondary | ICD-10-CM | POA: Diagnosis present

## 2014-02-22 DIAGNOSIS — Z880 Allergy status to penicillin: Secondary | ICD-10-CM | POA: Diagnosis not present

## 2014-02-22 DIAGNOSIS — R1110 Vomiting, unspecified: Secondary | ICD-10-CM | POA: Diagnosis present

## 2014-02-22 DIAGNOSIS — I739 Peripheral vascular disease, unspecified: Secondary | ICD-10-CM | POA: Diagnosis present

## 2014-02-22 DIAGNOSIS — E860 Dehydration: Secondary | ICD-10-CM | POA: Diagnosis present

## 2014-02-22 DIAGNOSIS — I10 Essential (primary) hypertension: Secondary | ICD-10-CM | POA: Diagnosis present

## 2014-02-22 DIAGNOSIS — R197 Diarrhea, unspecified: Secondary | ICD-10-CM

## 2014-02-22 DIAGNOSIS — R748 Abnormal levels of other serum enzymes: Secondary | ICD-10-CM | POA: Diagnosis present

## 2014-02-22 DIAGNOSIS — E785 Hyperlipidemia, unspecified: Secondary | ICD-10-CM | POA: Diagnosis present

## 2014-02-22 DIAGNOSIS — Z794 Long term (current) use of insulin: Secondary | ICD-10-CM

## 2014-02-22 DIAGNOSIS — Z87891 Personal history of nicotine dependence: Secondary | ICD-10-CM | POA: Diagnosis not present

## 2014-02-22 DIAGNOSIS — R05 Cough: Secondary | ICD-10-CM

## 2014-02-22 DIAGNOSIS — N200 Calculus of kidney: Secondary | ICD-10-CM | POA: Diagnosis present

## 2014-02-22 DIAGNOSIS — Z9861 Coronary angioplasty status: Secondary | ICD-10-CM

## 2014-02-22 DIAGNOSIS — N179 Acute kidney failure, unspecified: Secondary | ICD-10-CM | POA: Diagnosis present

## 2014-02-22 DIAGNOSIS — R109 Unspecified abdominal pain: Secondary | ICD-10-CM | POA: Diagnosis present

## 2014-02-22 DIAGNOSIS — R059 Cough, unspecified: Secondary | ICD-10-CM

## 2014-02-22 DIAGNOSIS — Z720 Tobacco use: Secondary | ICD-10-CM | POA: Diagnosis present

## 2014-02-22 DIAGNOSIS — R778 Other specified abnormalities of plasma proteins: Secondary | ICD-10-CM | POA: Diagnosis present

## 2014-02-22 HISTORY — DX: Acute myocardial infarction, unspecified: I21.9

## 2014-02-22 LAB — URINALYSIS, ROUTINE W REFLEX MICROSCOPIC
Bilirubin Urine: NEGATIVE
Glucose, UA: 250 mg/dL — AB
Hgb urine dipstick: NEGATIVE
Ketones, ur: NEGATIVE mg/dL
Leukocytes, UA: NEGATIVE
Nitrite: NEGATIVE
Protein, ur: NEGATIVE mg/dL
Specific Gravity, Urine: 1.025 (ref 1.005–1.030)
Urobilinogen, UA: 0.2 mg/dL (ref 0.0–1.0)
pH: 5.5 (ref 5.0–8.0)

## 2014-02-22 LAB — CBC
HCT: 46.1 % (ref 39.0–52.0)
Hemoglobin: 16.2 g/dL (ref 13.0–17.0)
MCH: 30.4 pg (ref 26.0–34.0)
MCHC: 35.1 g/dL (ref 30.0–36.0)
MCV: 86.5 fL (ref 78.0–100.0)
Platelets: 123 10*3/uL — ABNORMAL LOW (ref 150–400)
RBC: 5.33 MIL/uL (ref 4.22–5.81)
RDW: 16.3 % — ABNORMAL HIGH (ref 11.5–15.5)
WBC: 6 10*3/uL (ref 4.0–10.5)

## 2014-02-22 LAB — BASIC METABOLIC PANEL
Anion gap: 11 (ref 5–15)
BUN: 32 mg/dL — ABNORMAL HIGH (ref 6–23)
CO2: 22 mmol/L (ref 19–32)
Calcium: 8.5 mg/dL (ref 8.4–10.5)
Chloride: 108 mEq/L (ref 96–112)
Creatinine, Ser: 1.66 mg/dL — ABNORMAL HIGH (ref 0.50–1.35)
GFR calc Af Amer: 52 mL/min — ABNORMAL LOW (ref >90)
GFR calc non Af Amer: 45 mL/min — ABNORMAL LOW (ref >90)
Glucose, Bld: 38 mg/dL — CL (ref 70–99)
Potassium: 2.9 mmol/L — ABNORMAL LOW (ref 3.5–5.1)
Sodium: 141 mmol/L (ref 135–145)

## 2014-02-22 LAB — CBG MONITORING, ED
Glucose-Capillary: 194 mg/dL — ABNORMAL HIGH (ref 70–99)
Glucose-Capillary: 290 mg/dL — ABNORMAL HIGH (ref 70–99)
Glucose-Capillary: 35 mg/dL — CL (ref 70–99)
Glucose-Capillary: 119 mg/dL — ABNORMAL HIGH (ref 70–99)
Glucose-Capillary: 120 mg/dL — ABNORMAL HIGH (ref 70–99)
Glucose-Capillary: 131 mg/dL — ABNORMAL HIGH (ref 70–99)

## 2014-02-22 LAB — TROPONIN I
Troponin I: 0.27 ng/mL — ABNORMAL HIGH (ref <0.031)
Troponin I: 0.24 ng/mL — ABNORMAL HIGH (ref <0.031)

## 2014-02-22 LAB — LIPASE, BLOOD: Lipase: 12 U/L (ref 11–59)

## 2014-02-22 LAB — MAGNESIUM: Magnesium: 2.1 mg/dL (ref 1.5–2.5)

## 2014-02-22 MED ORDER — ONDANSETRON HCL 4 MG/2ML IJ SOLN
4.0000 mg | Freq: Once | INTRAMUSCULAR | Status: DC
  Filled 2014-02-22: qty 2

## 2014-02-22 MED ORDER — DEXTROSE 50 % IV SOLN
INTRAVENOUS | Status: AC
  Administered 2014-02-22: 50 mL via INTRAVENOUS
  Filled 2014-02-22: qty 50

## 2014-02-22 MED ORDER — POTASSIUM CHLORIDE 10 MEQ/100ML IV SOLN
10.0000 meq | Freq: Once | INTRAVENOUS | Status: AC
  Administered 2014-02-22: 10 meq via INTRAVENOUS
  Filled 2014-02-22: qty 100

## 2014-02-22 MED ORDER — SODIUM CHLORIDE 0.9 % IV BOLUS (SEPSIS)
1000.0000 mL | Freq: Once | INTRAVENOUS | Status: AC
Start: 2014-02-22 — End: 2014-02-23
  Administered 2014-02-23: 1000 mL via INTRAVENOUS

## 2014-02-22 MED ORDER — ASPIRIN 81 MG PO CHEW
324.0000 mg | CHEWABLE_TABLET | Freq: Once | ORAL | Status: AC
  Administered 2014-02-22: 324 mg via ORAL
  Filled 2014-02-22: qty 4

## 2014-02-22 MED ORDER — MORPHINE SULFATE 4 MG/ML IJ SOLN
4.0000 mg | Freq: Once | INTRAMUSCULAR | Status: AC
  Administered 2014-02-22: 4 mg via INTRAVENOUS
  Filled 2014-02-22: qty 1

## 2014-02-22 MED ORDER — DEXTROSE 50 % IV SOLN
25.0000 g | Freq: Once | INTRAVENOUS | Status: AC
  Administered 2014-02-22: 50 mL via INTRAVENOUS

## 2014-02-22 MED ORDER — POTASSIUM CHLORIDE CRYS ER 20 MEQ PO TBCR
60.0000 meq | EXTENDED_RELEASE_TABLET | Freq: Once | ORAL | Status: AC
  Administered 2014-02-22: 60 meq via ORAL
  Filled 2014-02-22: qty 3

## 2014-02-22 MED ORDER — SODIUM CHLORIDE 0.9 % IV SOLN: INTRAVENOUS | Status: DC

## 2014-02-22 MED ORDER — ONDANSETRON HCL 4 MG/2ML IJ SOLN
4.0000 mg | Freq: Once | INTRAMUSCULAR | Status: AC
  Administered 2014-02-22: 4 mg via INTRAVENOUS

## 2014-02-22 NOTE — ED Notes (Signed)
Critical glucose of 38 called by lab (result from initial blood draw). EDP aware and hypoglycemia addressed.

## 2014-02-22 NOTE — ED Provider Notes (Signed)
CSN: [id number]     Arrival date & time 02/22/14  1533 History  This chart was scribed for Raeford RazorStephen Maguire Killmer, MD by Roxy Cedarhandni Bhalodia, ED Scribe. This patient was seen in room APA06/APA06 and the patient's care was started at 6:20 PM.   Chief Complaint  Patient presents with  . Hypoglycemia   Patient is a 53 y.o. male presenting with abdominal pain. The history is provided by the patient. No language interpreter was used.  Abdominal Pain Pain location:  Generalized Pain quality: aching and cramping   Pain radiates to:  Does not radiate Pain severity:  Moderate Onset quality:  Gradual Duration:  2 days Associated symptoms: diarrhea, nausea and vomiting      HPI Comments: Oscar Elliott is a 53 y.o. male who presents to the Emergency Department complaining of moderate abdominal pain, nausea, multiple episodes of emesis, and watery diarrhea that began 2 days ago. He reports associated dizziness, lethargy and generalized weakness. He denies associated dysuria. He also has swelling of legs.   Past Medical History  Diagnosis Date  . Type 2 diabetes mellitus   . Schizophrenia   . Essential hypertension   . CAD (coronary artery disease)     a. 10/2013 Lateral STEMI/PCI: LM nl, LAD min irregs, D1 occluded sub branch, LCX min irregs mid, OM large/patent, RCA 11727m (2.75x28 Promus DES).  . Hyperlipidemia   . Peripheral vascular disease     a. 06/2007: s/p L AKA @ [facility]  . History of renal failure     a. 06/2007: in setting of rhabdo post L AKA.  . MI (myocardial infarction)    Past Surgical History  Procedure Laterality Date  . Above-the-knee amputation Left   . Left heart catheterization with coronary angiogram N/A 11/20/2013    Procedure: LEFT HEART CATHETERIZATION WITH CORONARY ANGIOGRAM;  Surgeon: Corky CraftsJayadeep S Varanasi, MD;  [facility];  Service: Cardiovascular;  Laterality: N/A;   Family History  Problem Relation Age of Onset  . Other      no premature CAD.   History  Substance  Use Topics  . Smoking status: Former Smoker -- 1.00 packs/day for 20 years    Types: Cigars, Cigarettes    Quit date: 12/13/2013  . Smokeless tobacco: Never Used  . Alcohol Use: No   Review of Systems  Gastrointestinal: Positive for nausea, vomiting, abdominal pain and diarrhea.  All other systems reviewed and are negative.  Allergies  Penicillins  Home Medications   Prior to Admission medications   Medication Sig Start Date End Date Taking? Authorizing Provider  amLODipine (NORVASC) 5 MG tablet Take 5 mg by mouth daily.   Yes Historical Provider, MD  ARIPiprazole (ABILIFY) 5 MG tablet Take 5 mg by mouth daily.   Yes Historical Provider, MD  aspirin EC 81 MG tablet Take 81 mg by mouth daily.   Yes Historical Provider, MD  atenolol (TENORMIN) 50 MG tablet Take 50 mg by mouth 2 (two) times daily.   Yes Historical Provider, MD  atorvastatin (LIPITOR) 80 MG tablet Take 1 tablet (80 mg total) by mouth daily at 6 PM. 11/23/13  Yes Rhonda G Barrett, PA-C  benztropine (COGENTIN) 1 MG tablet Take 1 mg by mouth at bedtime.   Yes Historical Provider, MD  clonazePAM (KLONOPIN) 0.5 MG tablet Take 0.5 mg by mouth at bedtime.   Yes Historical Provider, MD  cloZAPine (CLOZARIL) 100 MG tablet Take 150-500 mg by mouth 2 (two) times daily. Take 150mg  by mouth in AM and  500mg  by mouth in PM   Yes Historical Provider, MD  docusate sodium (COLACE) 100 MG capsule Take 100 mg by mouth daily.   Yes Historical Provider, MD  DULoxetine (CYMBALTA) 60 MG capsule Take 60 mg by mouth daily.   Yes Historical Provider, MD  gemfibrozil (LOPID) 600 MG tablet Take 600 mg by mouth 2 (two) times daily before a meal.   Yes Historical Provider, MD  insulin aspart (NOVOLOG) 100 UNIT/ML injection Inject 10 Units into the skin 3 (three) times daily before meals.   Yes Historical Provider, MD  insulin detemir (LEVEMIR) 100 UNIT/ML injection Inject 35 Units into the skin at bedtime.   Yes Historical Provider, MD  latanoprost  (XALATAN) 0.005 % ophthalmic solution Place 1 drop into both eyes at bedtime.   Yes Historical Provider, MD  metoprolol (LOPRESSOR) 50 MG tablet Take 1 tablet (50 mg total) by mouth 2 (two) times daily. 12/02/13  Yes Dyann Kief, PA-C  omeprazole (PRILOSEC) 40 MG capsule Take 40 mg by mouth daily.   Yes Historical Provider, MD  tamsulosin (FLOMAX) 0.4 MG CAPS capsule Take 0.4 mg by mouth.   Yes Historical Provider, MD  ticagrelor (BRILINTA) 90 MG TABS tablet Take 1 tablet (90 mg total) by mouth 2 (two) times daily. 11/23/13  Yes Rhonda G Barrett, PA-C  clonazePAM (KLONOPIN) 1 MG tablet Take 1 mg by mouth at bedtime.    Historical Provider, MD  loperamide (IMODIUM A-D) 2 MG tablet Take 2 mg by mouth 4 (four) times daily as needed for diarrhea or loose stools.    Historical Provider, MD  nicotine polacrilex (EQ NICOTINE) 4 MG gum Take 1 each (4 mg total) by mouth as needed for smoking cessation. 01/13/14   Jonelle Sidle, MD  nitroGLYCERIN (NITROSTAT) 0.4 MG SL tablet Place 1 tablet (0.4 mg total) under the tongue every 5 (five) minutes x 3 doses as needed for chest pain. 11/23/13   Joline Salt Barrett, PA-C   Triage Vitals: BP 109/68 mmHg  Pulse 104  Temp(Src) 99.3 F (37.4 C) (Oral)  Resp 16  Ht 5\' 2"  (1.575 m)  Wt 160 lb (72.576 kg)  BMI 29.26 kg/m2  SpO2 100%  Physical Exam  Constitutional: He appears well-developed and well-nourished. No distress.  HENT:  Head: Normocephalic and atraumatic.  Eyes: Conjunctivae are normal. Right eye exhibits no discharge. Left eye exhibits no discharge.  Neck: Neck supple.  Cardiovascular: Normal rate, regular rhythm and normal heart sounds.  Exam reveals no gallop and no friction rub.   No murmur heard. Pulmonary/Chest: Effort normal and breath sounds normal. No respiratory distress.  Abdominal: Soft. He exhibits no distension. There is tenderness. There is no rebound and no guarding.  Mild diffuse abdominal tenderness.  Musculoskeletal: He  exhibits no edema or tenderness.  Left leg amputation.  Neurological: He is alert.  Skin: Skin is warm and dry.  Psychiatric: He has a normal mood and affect. His behavior is normal. Thought content normal.  Nursing note and vitals reviewed.  ED Course  Procedures (including critical care time)  DIAGNOSTIC STUDIES: Oxygen Saturation is 100% on RA, normal by my interpretation.    COORDINATION OF CARE: 6:27 PM- Discussed plans to order diagnostic CXR and lab work. Will give patient dextrose 50% solution, and Zofran injection 4mg . Pt advised of plan for treatment and pt agrees.  Labs Review Labs Reviewed  CBC - Abnormal; Notable for the following:    RDW 16.3 (*)    Platelets 123 (*)  All other components within normal limits  BASIC METABOLIC PANEL - Abnormal; Notable for the following:    Potassium 2.9 (*)    Glucose, Bld 38 (*)    BUN 32 (*)    Creatinine, Ser 1.66 (*)    GFR calc non Af Amer 45 (*)    GFR calc Af Amer 52 (*)    All other components within normal limits  TROPONIN I - Abnormal; Notable for the following:    Troponin I 0.27 (*)    All other components within normal limits  CBG MONITORING, ED - Abnormal; Notable for the following:    Glucose-Capillary 35 (*)    All other components within normal limits  CBG MONITORING, ED - Abnormal; Notable for the following:    Glucose-Capillary 290 (*)    All other components within normal limits  CBG MONITORING, ED - Abnormal; Notable for the following:    Glucose-Capillary 194 (*)    All other components within normal limits  CBG MONITORING, ED - Abnormal; Notable for the following:    Glucose-Capillary 119 (*)    All other components within normal limits  CBG MONITORING, ED - Abnormal; Notable for the following:    Glucose-Capillary 120 (*)    All other components within normal limits  LIPASE, BLOOD  URINALYSIS, ROUTINE W REFLEX MICROSCOPIC   Imaging Review Dg Chest 2 View  02/22/2014   CLINICAL DATA:   Dizziness and lethargy  EXAM: CHEST  2 VIEW  COMPARISON:  November 20, 2013  FINDINGS: There is no edema or consolidation. The heart size is upper normal with pulmonary vascularity within normal limits. No adenopathy. No bone lesions.  IMPRESSION: No edema or consolidation.   Electronically Signed   By: Bretta BangWilliam  Woodruff M.D.   On: 02/22/2014 18:46     EKG Interpretation   Date/Time:  Monday February 22 2014 16:41:19 EST Ventricular Rate:  101 PR Interval:  137 QRS Duration: 79 QT Interval:  399 QTC Calculation: 517 R Axis:   91 Text Interpretation:  Sinus tachycardia Non-specific ST-t changes  resolution of lateral STE noted on previous Confirmed by Darnette Lampron  MD,  Darthula Desa ([id number]) on 02/22/2014 7:04:08 PM      MDM   Final diagnoses:  Cough  Vomiting and diarrhea  AKI (acute kidney injury)  Hypokalemia   53yM with n/v/d since last night and worsening through out the day. Suspect gastroenteritis. Pt reports pain in epigastrium. HX of schizophrenia and poor historian. Denies CP or SOB, but with STEMI three months ago, troponin ordered and mildly elevated.Discussed with Dr Patty SermonsBrackbill, cardiology. With clinical picture most consistent with GI process and subsequent troponin slightly decreased he [facility]. Very mild diffuse tenderness. Not peritoneal. No distension. Doubt acute surgical process. AKI likely prerenal. IVF. Hypokalemia probably from GI losses. Magnesium normal. Not on loop diuretic. Replete. Hypoglycemic on arrival. On insulin and poor PO intake today because of n/v. Has since stabilized. Discussed with Dr Alvester MorinNewton for admission to medical service.   I personally performed the services described in this documentation, which was scribed in my presence. The recorded information has been reviewed and is accurate.  Raeford RazorStephen Lynzi Meulemans, MD 02/22/14 2322

## 2014-02-22 NOTE — ED Notes (Signed)
Oscar Elliott came to said nurse and stated pt's IV catheter was lying in bed; upon assessment, catheter in floor and dressing still on pt with no active bleeding noted; IV reinserted and flushed without difficulty

## 2014-02-22 NOTE — ED Notes (Signed)
CBG Results:  290 at 1647 194 at 1658

## 2014-02-22 NOTE — ED Notes (Addendum)
Pt reports abdominal pain and vomiting x2 days. Pt sent here by PCP. Pt reports dizziness, lethargy,generalized weakness for last several hours. nad noted. Per caretaker, slurred speech just began prior to triage.

## 2014-02-22 NOTE — ED Notes (Addendum)
CBG 36 in triage. EDP aware and reported complete EKG,1 amp D50. IV access obtained by ED staff.

## 2014-02-23 ENCOUNTER — Observation Stay (HOSPITAL_COMMUNITY): Payer: Medicare Other

## 2014-02-23 ENCOUNTER — Encounter (HOSPITAL_COMMUNITY): Payer: Self-pay | Admitting: *Deleted

## 2014-02-23 DIAGNOSIS — R1084 Generalized abdominal pain: Secondary | ICD-10-CM

## 2014-02-23 DIAGNOSIS — E876 Hypokalemia: Secondary | ICD-10-CM | POA: Diagnosis present

## 2014-02-23 DIAGNOSIS — R1110 Vomiting, unspecified: Secondary | ICD-10-CM

## 2014-02-23 DIAGNOSIS — A0472 Enterocolitis due to Clostridium difficile, not specified as recurrent: Secondary | ICD-10-CM | POA: Diagnosis present

## 2014-02-23 DIAGNOSIS — R109 Unspecified abdominal pain: Secondary | ICD-10-CM | POA: Diagnosis present

## 2014-02-23 DIAGNOSIS — R197 Diarrhea, unspecified: Secondary | ICD-10-CM

## 2014-02-23 DIAGNOSIS — R112 Nausea with vomiting, unspecified: Secondary | ICD-10-CM | POA: Diagnosis present

## 2014-02-23 DIAGNOSIS — N179 Acute kidney failure, unspecified: Secondary | ICD-10-CM

## 2014-02-23 DIAGNOSIS — R05 Cough: Secondary | ICD-10-CM

## 2014-02-23 LAB — COMPREHENSIVE METABOLIC PANEL
ALK PHOS: 63 U/L (ref 39–117)
ALT: 25 U/L (ref 0–53)
AST: 26 U/L (ref 0–37)
Albumin: 3.3 g/dL — ABNORMAL LOW (ref 3.5–5.2)
Anion gap: 9 (ref 5–15)
BUN: 27 mg/dL — AB (ref 6–23)
CO2: 21 mmol/L (ref 19–32)
Calcium: 8.2 mg/dL — ABNORMAL LOW (ref 8.4–10.5)
Chloride: 107 mEq/L (ref 96–112)
Creatinine, Ser: 1.38 mg/dL — ABNORMAL HIGH (ref 0.50–1.35)
GFR, EST AFRICAN AMERICAN: 66 mL/min — AB (ref >90)
GFR, EST NON AFRICAN AMERICAN: 57 mL/min — AB (ref >90)
GLUCOSE: 121 mg/dL — AB (ref 70–99)
Potassium: 3.2 mmol/L — ABNORMAL LOW (ref 3.5–5.1)
SODIUM: 137 mmol/L (ref 135–145)
Total Bilirubin: 0.8 mg/dL (ref 0.3–1.2)
Total Protein: 6.7 g/dL (ref 6.0–8.3)

## 2014-02-23 LAB — CBC WITH DIFFERENTIAL/PLATELET
BASOS ABS: 0 10*3/uL (ref 0.0–0.1)
BASOS PCT: 0 % (ref 0–1)
EOS ABS: 0.1 10*3/uL (ref 0.0–0.7)
Eosinophils Relative: 1 % (ref 0–5)
HCT: 44.2 % (ref 39.0–52.0)
Hemoglobin: 15.2 g/dL (ref 13.0–17.0)
Lymphocytes Relative: 10 % — ABNORMAL LOW (ref 12–46)
Lymphs Abs: 0.7 10*3/uL (ref 0.7–4.0)
MCH: 29.9 pg (ref 26.0–34.0)
MCHC: 34.4 g/dL (ref 30.0–36.0)
MCV: 87 fL (ref 78.0–100.0)
Monocytes Absolute: 1 10*3/uL (ref 0.1–1.0)
Monocytes Relative: 14 % — ABNORMAL HIGH (ref 3–12)
NEUTROS PCT: 76 % (ref 43–77)
Neutro Abs: 5.4 10*3/uL (ref 1.7–7.7)
PLATELETS: 108 10*3/uL — AB (ref 150–400)
RBC: 5.08 MIL/uL (ref 4.22–5.81)
RDW: 16.4 % — ABNORMAL HIGH (ref 11.5–15.5)
SMEAR REVIEW: DECREASED
WBC MORPHOLOGY: INCREASED
WBC: 7.2 10*3/uL (ref 4.0–10.5)

## 2014-02-23 LAB — CBG MONITORING, ED: Glucose-Capillary: 100 mg/dL — ABNORMAL HIGH (ref 70–99)

## 2014-02-23 LAB — GLUCOSE, CAPILLARY
GLUCOSE-CAPILLARY: 137 mg/dL — AB (ref 70–99)
GLUCOSE-CAPILLARY: 185 mg/dL — AB (ref 70–99)
Glucose-Capillary: 134 mg/dL — ABNORMAL HIGH (ref 70–99)

## 2014-02-23 LAB — TROPONIN I
TROPONIN I: 0.24 ng/mL — AB (ref <0.031)
TROPONIN I: 0.21 ng/mL — AB (ref <0.031)
Troponin I: 0.25 ng/mL — ABNORMAL HIGH (ref <0.031)

## 2014-02-23 LAB — HEMOGLOBIN A1C
Hgb A1c MFr Bld: 5.8 % — ABNORMAL HIGH (ref <5.7)
MEAN PLASMA GLUCOSE: 120 mg/dL — AB (ref <117)

## 2014-02-23 LAB — OCCULT BLOOD, POC DEVICE: FECAL OCCULT BLD: NEGATIVE

## 2014-02-23 LAB — CLOSTRIDIUM DIFFICILE BY PCR: Toxigenic C. Difficile by PCR: POSITIVE — AB

## 2014-02-23 MED ORDER — INSULIN ASPART 100 UNIT/ML ~~LOC~~ SOLN
0.0000 [IU] | SUBCUTANEOUS | Status: DC
  Administered 2014-02-23: 1 [IU] via SUBCUTANEOUS
  Administered 2014-02-23: 2 [IU] via SUBCUTANEOUS
  Administered 2014-02-23 – 2014-02-24 (×5): 1 [IU] via SUBCUTANEOUS
  Administered 2014-02-25 (×2): 2 [IU] via SUBCUTANEOUS
  Administered 2014-02-25 – 2014-02-26 (×2): 1 [IU] via SUBCUTANEOUS

## 2014-02-23 MED ORDER — NITROGLYCERIN 0.4 MG SL SUBL: 0.4000 mg | SUBLINGUAL_TABLET | SUBLINGUAL | Status: DC | PRN

## 2014-02-23 MED ORDER — CLOZAPINE 100 MG PO TABS
150.0000 mg | ORAL_TABLET | Freq: Every day | ORAL | Status: DC
  Administered 2014-02-23 – 2014-02-26 (×4): 150 mg via ORAL
  Filled 2014-02-23 (×7): qty 2

## 2014-02-23 MED ORDER — SODIUM CHLORIDE 0.9 % IV SOLN: INTRAVENOUS | Status: DC

## 2014-02-23 MED ORDER — DULOXETINE HCL 60 MG PO CPEP
60.0000 mg | ORAL_CAPSULE | Freq: Every day | ORAL | Status: DC
  Administered 2014-02-23 – 2014-02-26 (×4): 60 mg via ORAL
  Filled 2014-02-23 (×4): qty 1

## 2014-02-23 MED ORDER — POTASSIUM CHLORIDE IN NACL 20-0.9 MEQ/L-% IV SOLN
INTRAVENOUS | Status: DC
  Administered 2014-02-23 – 2014-02-24 (×2): via INTRAVENOUS

## 2014-02-23 MED ORDER — INSULIN DETEMIR 100 UNIT/ML ~~LOC~~ SOLN
25.0000 [IU] | Freq: Every day | SUBCUTANEOUS | Status: DC
  Filled 2014-02-23 (×2): qty 0.25

## 2014-02-23 MED ORDER — BENZTROPINE MESYLATE 1 MG PO TABS
1.0000 mg | ORAL_TABLET | Freq: Every day | ORAL | Status: DC
  Administered 2014-02-23 – 2014-02-25 (×4): 1 mg via ORAL
  Filled 2014-02-23 (×4): qty 1

## 2014-02-23 MED ORDER — CLOZAPINE 100 MG PO TABS
500.0000 mg | ORAL_TABLET | Freq: Every day | ORAL | Status: DC
Start: 2014-02-23 — End: 2014-02-26
  Administered 2014-02-23 – 2014-02-25 (×3): 500 mg via ORAL
  Filled 2014-02-23 (×7): qty 5

## 2014-02-23 MED ORDER — TAMSULOSIN HCL 0.4 MG PO CAPS
0.4000 mg | ORAL_CAPSULE | Freq: Every day | ORAL | Status: DC
  Administered 2014-02-23 – 2014-02-26 (×4): 0.4 mg via ORAL
  Filled 2014-02-23 (×4): qty 1

## 2014-02-23 MED ORDER — HEPARIN SODIUM (PORCINE) 5000 UNIT/ML IJ SOLN
5000.0000 [IU] | Freq: Three times a day (TID) | INTRAMUSCULAR | Status: DC
  Administered 2014-02-23 – 2014-02-26 (×10): 5000 [IU] via SUBCUTANEOUS
  Filled 2014-02-23 (×11): qty 1

## 2014-02-23 MED ORDER — METRONIDAZOLE IN NACL 5-0.79 MG/ML-% IV SOLN
500.0000 mg | Freq: Three times a day (TID) | INTRAVENOUS | Status: DC
  Administered 2014-02-23 – 2014-02-24 (×4): 500 mg via INTRAVENOUS
  Filled 2014-02-23 (×5): qty 100

## 2014-02-23 MED ORDER — METOPROLOL TARTRATE 50 MG PO TABS
50.0000 mg | ORAL_TABLET | Freq: Two times a day (BID) | ORAL | Status: DC
  Administered 2014-02-23 – 2014-02-26 (×7): 50 mg via ORAL
  Filled 2014-02-23 (×8): qty 1

## 2014-02-23 MED ORDER — ARIPIPRAZOLE 10 MG PO TABS
5.0000 mg | ORAL_TABLET | Freq: Every day | ORAL | Status: DC
  Administered 2014-02-23 – 2014-02-26 (×4): 5 mg via ORAL
  Filled 2014-02-23 (×6): qty 1

## 2014-02-23 MED ORDER — GEMFIBROZIL 600 MG PO TABS
600.0000 mg | ORAL_TABLET | Freq: Two times a day (BID) | ORAL | Status: DC
  Administered 2014-02-23 – 2014-02-26 (×7): 600 mg via ORAL
  Filled 2014-02-23 (×10): qty 1

## 2014-02-23 MED ORDER — ASPIRIN EC 81 MG PO TBEC
81.0000 mg | DELAYED_RELEASE_TABLET | Freq: Every day | ORAL | Status: DC
  Administered 2014-02-23 – 2014-02-26 (×4): 81 mg via ORAL
  Filled 2014-02-23 (×4): qty 1

## 2014-02-23 MED ORDER — CIPROFLOXACIN IN D5W 400 MG/200ML IV SOLN
400.0000 mg | Freq: Two times a day (BID) | INTRAVENOUS | Status: DC
  Administered 2014-02-23 (×2): 400 mg via INTRAVENOUS
  Filled 2014-02-23 (×2): qty 200

## 2014-02-23 MED ORDER — SODIUM CHLORIDE 0.9 % IJ SOLN
3.0000 mL | Freq: Two times a day (BID) | INTRAMUSCULAR | Status: DC
  Administered 2014-02-23 – 2014-02-26 (×3): 3 mL via INTRAVENOUS

## 2014-02-23 MED ORDER — CLONAZEPAM 0.5 MG PO TABS
0.5000 mg | ORAL_TABLET | Freq: Every day | ORAL | Status: DC
Start: 2014-02-23 — End: 2014-02-26
  Administered 2014-02-23 – 2014-02-25 (×4): 0.5 mg via ORAL
  Filled 2014-02-23 (×4): qty 1

## 2014-02-23 MED ORDER — SACCHAROMYCES BOULARDII 250 MG PO CAPS
250.0000 mg | ORAL_CAPSULE | Freq: Two times a day (BID) | ORAL | Status: DC
  Administered 2014-02-23 – 2014-02-26 (×7): 250 mg via ORAL
  Filled 2014-02-23 (×7): qty 1

## 2014-02-23 MED ORDER — TICAGRELOR 90 MG PO TABS
90.0000 mg | ORAL_TABLET | Freq: Two times a day (BID) | ORAL | Status: DC
  Administered 2014-02-23 – 2014-02-26 (×8): 90 mg via ORAL
  Filled 2014-02-23 (×14): qty 1

## 2014-02-23 MED ORDER — PANTOPRAZOLE SODIUM 40 MG PO TBEC
40.0000 mg | DELAYED_RELEASE_TABLET | Freq: Every day | ORAL | Status: DC
  Administered 2014-02-23 – 2014-02-26 (×4): 40 mg via ORAL
  Filled 2014-02-23 (×4): qty 1

## 2014-02-23 NOTE — Progress Notes (Signed)
+  CRITICAL VALUE ALERT  Critical value received:  Cdiff positive  Date of notification:  02/23/2014  Time of notification:  1300  Critical value read back:yes  Nurse who received alert:  Onnie BoerJennifer Taline Nass  MD notified (1st page):  Dr. Rhona Leavenshiu   Time of first page:  1321  MD notified (2nd page):  Time of second page:  Responding MD:  Dr Rhona Leavenshiu  Time MD responded:

## 2014-02-23 NOTE — Progress Notes (Signed)
UR completed 

## 2014-02-23 NOTE — Progress Notes (Signed)
Dr. Rhona Leavenshiu notified of patients positive troponins. Patient denies chest pain and no distress noted.No new orders at this time.

## 2014-02-23 NOTE — Progress Notes (Signed)
[facility] PROGRESS NOTE  [name] WRU:[id number] DOB: 1960-11-10 DOA: 02/22/2014 PCP: Anselm Jungling, NP  Assessment/Plan: 1. Cdiff colitis with ileus  1. Will cont flagyl, d/c cipro 2. CT abd findings of colitis involving transverse colon and loops of small bowel suggestive of adynamic small bowel ileus  3. Pt is tolerating clear liquid diet thus far 4. Cont po as tolerated, low threshold for NPO status if pt develops n/v/abd pain 5. Will start probiotic 2. Elevated trop 1. Cardiology was consulted 2. Suspicion for demand ischemia with recs to cont current regimen 3. Hypokalemia 1. Replace as needed 4. DM 1. Will cont on SSI coverage 5. Schizophrenia 1. Cont home meds as tolerated 2. Seems stable at present 6. DVT prophylaxis 1. Heparin subq  Code Status: Full Family Communication: Pt in room  Disposition Plan: Pending   Consultants:  Cardiology  Procedures:    Antibiotics:  Ciprofloxacin 12/28>>>12/29  Flagyl 12/28>>> (indicate start date, and stop date if known)  HPI/Subjective: No acute events. Pt is without complaints  Objective: Filed Vitals:   02/23/14 0815 02/23/14 0817 02/23/14 0853 02/23/14 0907  BP:  117/77 102/57 118/66  Pulse: 94  64 94  Temp:   97.8 F (36.6 C) 98.4 F (36.9 C)  TempSrc:   Oral Oral  Resp:      Height:    5\' 2"  (1.575 m)  Weight:    58.378 kg (128 lb 11.2 oz)  SpO2: 100%  97% 99%   No intake or output data in the 24 hours ending 02/23/14 1334 Filed Weights   02/22/14 1631 02/23/14 0907  Weight: 72.576 kg (160 lb) 58.378 kg (128 lb 11.2 oz)    Exam:   General:  Awake, in nad  Cardiovascular: regular, s1, s2  Respiratory: normal resp effort, no wheezing  Abdomen: soft, pos BS  Musculoskeletal: perfused, no clubbing   Data Reviewed: Basic Metabolic Panel:  Recent Labs Lab 02/22/14 1641 02/22/14 1938 02/23/14 0706  NA 141  --  137  K 2.9*  --  3.2*  CL 108  --  107  CO2 22  --  21   GLUCOSE 38*  --  121*  BUN 32*  --  27*  CREATININE 1.66*  --  1.38*  CALCIUM 8.5  --  8.2*  MG  --  2.1  --    Liver Function Tests:  Recent Labs Lab 02/23/14 0706  AST 26  ALT 25  ALKPHOS 63  BILITOT 0.8  PROT 6.7  ALBUMIN 3.3*    Recent Labs Lab 02/22/14 1641  LIPASE 12   No results for input(s): AMMONIA in the last 168 hours. CBC:  Recent Labs Lab 02/22/14 1641 02/23/14 0706  WBC 6.0 7.2  NEUTROABS  --  5.4  HGB 16.2 15.2  HCT 46.1 44.2  MCV 86.5 87.0  PLT 123* 108*   Cardiac Enzymes:  Recent Labs Lab 02/22/14 1641 02/22/14 2147 02/23/14 0054 02/23/14 0706  TROPONINI 0.27* 0.24* 0.25* 0.24*   BNP (last 3 results) No results for input(s): PROBNP in the last 8760 hours. CBG:  Recent Labs Lab 02/22/14 1737 02/22/14 1821 02/22/14 2124 02/23/14 0743 02/23/14 1157  GLUCAP 119* 120* 131* 100* 137*    Recent Results (from the past 240 hour(s))  Clostridium Difficile by PCR     Status: Abnormal   Collection Time: 02/23/14  8:20 AM  Result Value Ref Range Status   C difficile by pcr POSITIVE (A) NEGATIVE Final    Comment: CRITICAL  RESULT CALLED TO, READ BACK BY AND VERIFIED WITH: COFFEY,J. AT 1253 ON 02/23/2014 BY BAUGHAM,M.      Studies: Ct Abdomen Pelvis Wo Contrast  02/23/2014   CLINICAL DATA:  Nausea/vomiting, abdominal pain, diarrhea.  EXAM: CT ABDOMEN AND PELVIS WITHOUT CONTRAST  TECHNIQUE: Multidetector CT imaging of the abdomen and pelvis was performed following the standard protocol without IV contrast.  COMPARISON:  06/21/2011  FINDINGS: Lower chest:  Mild scarring at the lung bases.  Hepatobiliary: Liver is within normal limits.  Layering gallstones (series 2/image 28). No intrahepatic or extrahepatic ductal dilatation.  Pancreas: Within normal limits.  Spleen: Within normal limits.  Adrenals/Urinary Tract: Stable low-density thickening of the bilateral adrenal glands, likely reflecting benign adrenal hyperplasia.  7 mm nonobstructing  left lower pole renal calculus (series 2/ image 40). Right kidney is within normal limits. No hydronephrosis.  No ureteral or bladder calculi.  Bladder is within normal limits.  Stomach/Bowel: Stomach is unremarkable.  Multiple dilated loops of proximal/mid small bowel, without discrete focal transition. Distal/terminal ileum is decompressed (series 2/image 24). This appearance favors adynamic small bowel ileus over small bowel obstruction given the associated colonic findings (below).  Wall thickening involving in the ascending and proximal transverse colon (series 2/ image 37), suspicious for infectious/inflammatory colitis.  No drainable fluid collection/abscess.  No free air.  Vascular/Lymphatic: Fusiform ectasia of the infrarenal abdominal aorta measuring 2.9 x 2.5 cm (series 2/ image 47). 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  No suspicious abdominopelvic lymphadenopathy.  Reproductive: Prostate is notable for dystrophic calcifications.  Other: No abdominopelvic ascites.  Musculoskeletal: Degenerative changes of the visualized thoracolumbar spine.  IMPRESSION: Wall thickening involving the ascending and proximal transverse colon, suspicious for infectious/inflammatory colitis.  Multiple loops of proximal/mid small bowel, favored to reflect adynamic small bowel ileus, less likely partial small bowel obstruction.  Cholelithiasis, without associated inflammatory changes.  7 mm nonobstructing left lower pole renal calculus. No ureteral or bladder calculi. No hydronephrosis.   Electronically Signed   By: Charline BillsSriyesh  Krishnan M.D.   On: 02/23/2014 02:54   Dg Chest 2 View  02/22/2014   CLINICAL DATA:  Dizziness and lethargy  EXAM: CHEST  2 VIEW  COMPARISON:  November 20, 2013  FINDINGS: There is no edema or consolidation. The heart size is upper normal with pulmonary vascularity within normal limits. No adenopathy. No bone lesions.  IMPRESSION: No edema or consolidation.   Electronically  Signed   By: Bretta BangWilliam  Woodruff M.D.   On: 02/22/2014 18:46    Scheduled Meds: . ARIPiprazole  5 mg Oral Daily  . aspirin EC  81 mg Oral Daily  . benztropine  1 mg Oral QHS  . clonazePAM  0.5 mg Oral QHS  . cloZAPine  150 mg Oral Daily  . cloZAPine  500 mg Oral QHS  . DULoxetine  60 mg Oral Daily  . gemfibrozil  600 mg Oral BID AC  . heparin  5,000 Units Subcutaneous 3 times per day  . insulin aspart  0-9 Units Subcutaneous 6 times per day  . metoprolol  50 mg Oral BID  . metronidazole  500 mg Intravenous Q8H  . pantoprazole  40 mg Oral Daily  . saccharomyces boulardii  250 mg Oral BID  . sodium chloride  3 mL Intravenous Q12H  . tamsulosin  0.4 mg Oral Daily  . ticagrelor  90 mg Oral BID   Continuous Infusions: . 0.9 % NaCl with KCl 20 mEq / L 75 mL/hr at  02/23/14 0910    Active Problems:   Vomiting and diarrhea   Nausea and vomiting   Abdominal pain  Time spent: 25min  Oscar Elliott K  [facility] Pager [phone]. If 7PM-7AM, please contact night-coverage at [email or web address], password [email or web address] 02/23/2014, 1:34 PM  LOS: 1 day

## 2014-02-23 NOTE — ED Notes (Signed)
Attempted to call report. Victorino DikeJennifer, receiving RN reported would call back.

## 2014-02-23 NOTE — ED Notes (Addendum)
Morning dose of Lopid not in ER pyxis. No answer at time of calling. Clarifying with Hospitalist concerning NPO status.

## 2014-02-23 NOTE — Progress Notes (Signed)
CARDIOLOGY CONSULT NOTE   Patient ID: Oscar Elliott MRN: [id number] DOB/AGE: [birth year] 53 y.o.  Admit Date: 02/22/2014 Referring Physician: Forestine Na MD Primary Physician: Anselm Jungling, NP Consulting Cardiologist: Dietrich Pates MD Primary Cardiologist: Nona Dell MD Reason for Consultation: Elevated Troponin with known CAD  Clinical Summary Oscar Elliott is a 53 y.o.male known history of coronary artery disease, history of STEMI treated with drug-eluting stent to the RCA in September 2015, cardiac catheterization revealing widely patent LAD, trivial diagonal branch occluded with mild atherosclerosis in the left circumflex and its branches. The patient's echocardiogram revealed an EF of 50-55% with hypokinesis of the basal inferior inferior lateral and inferior septal walls. He is on dual antiplatelet therapy. Other history includes PVD, hypertension, tobacco abuse, and schizophrenia. He lives in a group home.  He presented to the emergency room with nausea, vomiting and abdominal pain, with generalized weakness and diarrhea. He states this was going on for approximately 2 days before coming to the emergency room. He denied chest pain but did feel weak and mildly short of breath. There was no melena or hematemesis.  He was found to be tachycardiac with nonspecific T-wave abnormalities. Troponin was elevated at 0.27, 0.24, 0.25, and 0.24, respectively. He was found to be hypokalemic with potassium of 2.9. We are asked for cardiac recommendations concerning elevated troponin.  On arrival to the emergency room this patient's blood pressure was 109/68 with a heart rate of 104, O2 sat 100%. He had mild low-grade fever of 99.3. As stated, he was hypokalemic. There was no evidence of leukocytosis. His elevated glucose. His urine of 250. CT scan of the abdomen demonstrated a 7 mm nonobstructing left lower pole renal calculus, he also had wall thickening involving the ascending and  proximal transverse colon suspicious for infectious or inflammatory colitis. Chest x-ray was negative for pneumonia, edema or CHF. He was treated with IV fluids, morphine, Zofran, potassium replacement, metoprolol 50 mg, Brilinta,  insulin, Flagyl and Cipro, and restarted on psychotropic meds.  He is currently complaining of some soreness in his abdomen, but no further nausea or diarrhea. He is not complaining of any chest discomfort or shortness of breath.   Allergies  Allergen Reactions  . Penicillins Other (See Comments)    Reaction unknown    Medications Scheduled Medications: . ARIPiprazole  5 mg Oral Daily  . aspirin EC  81 mg Oral Daily  . benztropine  1 mg Oral QHS  . ciprofloxacin  400 mg Intravenous Q12H  . clonazePAM  0.5 mg Oral QHS  . cloZAPine  150 mg Oral Daily  . cloZAPine  500 mg Oral QHS  . DULoxetine  60 mg Oral Daily  . gemfibrozil  600 mg Oral BID AC  . heparin  5,000 Units Subcutaneous 3 times per day  . insulin aspart  0-9 Units Subcutaneous 6 times per day  . metoprolol  50 mg Oral BID  . metronidazole  500 mg Intravenous Q8H  . pantoprazole  40 mg Oral Daily  . sodium chloride  3 mL Intravenous Q12H  . tamsulosin  0.4 mg Oral Daily  . ticagrelor  90 mg Oral BID    Infusions: . 0.9 % NaCl with KCl 20 mEq / L 75 mL/hr at 02/23/14 0910    PRN Medications: nitroGLYCERIN   Past Medical History  Diagnosis Date  . Type 2 diabetes mellitus   . Schizophrenia   . Essential hypertension   . CAD (coronary artery disease)  a. 10/2013 Lateral STEMI/PCI: LM nl, LAD min irregs, D1 occluded sub branch, LCX min irregs mid, OM large/patent, RCA 16968m (2.75x28 Promus DES).  . Hyperlipidemia   . Peripheral vascular disease     a. 06/2007: s/p L AKA @ [facility]  . History of renal failure     a. 06/2007: in setting of rhabdo post L AKA.  . MI (myocardial infarction)     Past Surgical History  Procedure Laterality Date  . Above-the-knee amputation Left   . Left  heart catheterization with coronary angiogram N/A 11/20/2013    Procedure: LEFT HEART CATHETERIZATION WITH CORONARY ANGIOGRAM;  Surgeon: Corky CraftsJayadeep S Varanasi, MD;  [facility];  Service: Cardiovascular;  Laterality: N/A;    Family History  Problem Relation Age of Onset  . Other      no premature CAD.    Social History [name] reports that he quit smoking about 2 months ago. His smoking use included Cigars and Cigarettes. He has a 20 pack-year smoking history. He has never used smokeless tobacco. [name] reports that he does not drink alcohol.  Review of Systems Complete review of systems are found to be negative unless outlined in H&P above.  Physical Examination Blood pressure 118/66, pulse 94, temperature 98.4 F (36.9 C), temperature source Oral, resp. rate 20, height 5\' 2"  (1.575 m), weight 128 lb 11.2 oz (58.378 kg), SpO2 99 %. No intake or output data in the 24 hours ending 02/23/14 1109  Telemetry: Normal sinus rhythm with artifact. Rates in the 80s to 90s  GEN: Resting comfortably, complaining of abdominal soreness HEENT: Conjunctiva and lids normal, oropharynx clear with moist mucosa. Neck: Supple, no elevated JVP or carotid bruits, no thyromegaly. Lungs: Clear to auscultation, nonlabored breathing at rest. Cardiac: Regular rate and rhythm, no S3 or significant systolic murmur, no pericardial rub. Abdomen: Mild diffuse tenderness no hepatomegaly, bowel sounds present, no guarding or rebound. Extremities: No pitting edema, distal pulses 2+. Left above-the-knee amputation Skin: Warm and dry. Musculoskeletal: No kyphosis. Neuropsychiatric: Alert and oriented x3, affect grossly appropriate.  Prior Cardiac Testing/Procedures 1.Echocardiogram 11/10/2013 Left ventricle: Hypokinesis of the basal inferior, inferolateral and inferoseptal walls. The cavity size was normal. There was moderate concentric hypertrophy. Systolic function was normal. The  estimated ejection fraction was in the range of 50% to 55%. Wall motion was normal; there were no regional wall motion abnormalities. Left ventricular diastolic function parameters were normal. - Aortic valve: Trileaflet; normal thickness leaflets. There was no regurgitation. - Left atrium: The atrium was normal in size. - Right ventricle: The cavity size was normal. Wall thickness was moderately increased. Systolic function was mildly reduced. - Tricuspid valve: There was mild regurgitation. - Pulmonic valve: There was no regurgitation. - Pulmonary arteries: Systolic pressure was within the normal range. - Pericardium, extracardiac: A mild circumferential pericardial effusion was identified. Features were not consistent with tamponade physiology.  2. Cardiac Cath 11/20/2013 1. Normal left main coronary artery. 2. Widely patent left anterior descending artery. Trivial diagonal branch occluded. 3. Mild atherosclerosis in the left circumflex artery and its branches. 4. Occluded mid right coronary artery which was the culprit for his presentation. This was successfully treated with a 2.75 x 28 Promus drug-eluting stent, postdilated to 3.3 mm in diameter. 5. Left ventricular systolic function not assessed. LVEDP 17 mmHg.  Lab Results  Basic Metabolic Panel:  Recent Labs Lab 02/22/14 1641 02/22/14 1938 02/23/14 0706  NA 141  --  137  K 2.9*  --  3.2*  CL 108  --  107  CO2 22  --  21  GLUCOSE 38*  --  121*  BUN 32*  --  27*  CREATININE 1.66*  --  1.38*  CALCIUM 8.5  --  8.2*  MG  --  2.1  --     Liver Function Tests:  Recent Labs Lab 02/23/14 0706  AST 26  ALT 25  ALKPHOS 63  BILITOT 0.8  PROT 6.7  ALBUMIN 3.3*    CBC:  Recent Labs Lab 02/22/14 1641 02/23/14 0706  WBC 6.0 7.2  NEUTROABS  --  5.4  HGB 16.2 15.2  HCT 46.1 44.2  MCV 86.5 87.0  PLT 123* 108*    Cardiac Enzymes:  Recent Labs Lab 02/22/14 1641 02/22/14 2147  02/23/14 0054 02/23/14 0706  TROPONINI 0.27* 0.24* 0.25* 0.24*    Radiology: Ct Abdomen Pelvis Wo Contrast  02/23/2014   CLINICAL DATA:  Nausea/vomiting, abdominal pain, diarrhea.  EXAM: CT ABDOMEN AND PELVIS WITHOUT CONTRAST  TECHNIQUE: Multidetector CT imaging of the abdomen and pelvis was performed following the standard protocol without IV contrast.  COMPARISON:  06/21/2011  FINDINGS: Lower chest:  Mild scarring at the lung bases.  Hepatobiliary: Liver is within normal limits.  Layering gallstones (series 2/image 28). No intrahepatic or extrahepatic ductal dilatation.  Pancreas: Within normal limits.  Spleen: Within normal limits.  Adrenals/Urinary Tract: Stable low-density thickening of the bilateral adrenal glands, likely reflecting benign adrenal hyperplasia.  7 mm nonobstructing left lower pole renal calculus (series 2/ image 40). Right kidney is within normal limits. No hydronephrosis.  No ureteral or bladder calculi.  Bladder is within normal limits.  Stomach/Bowel: Stomach is unremarkable.  Multiple dilated loops of proximal/mid small bowel, without discrete focal transition. Distal/terminal ileum is decompressed (series 2/image 24). This appearance favors adynamic small bowel ileus over small bowel obstruction given the associated colonic findings (below).  Wall thickening involving in the ascending and proximal transverse colon (series 2/ image 37), suspicious for infectious/inflammatory colitis.  No drainable fluid collection/abscess.  No free air.  Vascular/Lymphatic: Fusiform ectasia of the infrarenal abdominal aorta measuring 2.9 x 2.5 cm (series 2/ image 47). 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  No suspicious abdominopelvic lymphadenopathy.  Reproductive: Prostate is notable for dystrophic calcifications.  Other: No abdominopelvic ascites.  Musculoskeletal: Degenerative changes of the visualized thoracolumbar spine.  IMPRESSION: Wall thickening involving the  ascending and proximal transverse colon, suspicious for infectious/inflammatory colitis.  Multiple loops of proximal/mid small bowel, favored to reflect adynamic small bowel ileus, less likely partial small bowel obstruction.  Cholelithiasis, without associated inflammatory changes.  7 mm nonobstructing left lower pole renal calculus. No ureteral or bladder calculi. No hydronephrosis.   Electronically Signed   By: Charline BillsSriyesh  Krishnan M.D.   On: 02/23/2014 02:54   Dg Chest 2 View  02/22/2014   CLINICAL DATA:  Dizziness and lethargy  EXAM: CHEST  2 VIEW  COMPARISON:  November 20, 2013  FINDINGS: There is no edema or consolidation. The heart size is upper normal with pulmonary vascularity within normal limits. No adenopathy. No bone lesions.  IMPRESSION: No edema or consolidation.   Electronically Signed   By: Bretta BangWilliam  Woodruff M.D.   On: 02/22/2014 18:46     ECG: Sinus tachycardia, rate of 101, nonspecific T-wave abnormalities inferior laterally, with prolonged QT interval.   Impression and Recommendations  1. Elevated troponin: Minimal elevation.  Prob related to  demand ischemia. . There were no acute ST-T  wave abnormalities noted. Would not planned any invasive or noninvasive cardiac testing at this time. Continue current medication regimen.  2. Hypertension: Blood pressure low normal, likely related to mild dehydration from colitis. He has been taken off amlodipine temporarily. Normal dose is 5 mg a day at home. He is receiving IV hydration. May be able to restart once he is stable, possibly at a lower dose. Will follow blood pressure to determine dosing prior to discharge.  3.CAD. History of stent to RCA. We will continue dual antiplatelet therapy, statin, Beta blocker . Elevated troponins, likely related to demand ischemia in setting of CAD  Will not plan any cardiac testing at this time.   4. PVD: Status post left above-the-knee amputation.  5. Hx of Renal failure: Creatinine elevated on  admission. Likely related to dehydration from nausea, vomiting and diarrhea. This is improving. Continues potassium replacement.  Signed: Bettey Mare. Lawrence NP [facility]  02/23/2014, 11:09 AM Co-Sign MD  Patient seen and examined  I have amended note above to reflect my findings.  Very mild trop elevation prob demand ischemia in setting of CAD  Rec supportive care and ABX as doing.    Dietrich Pates

## 2014-02-23 NOTE — Progress Notes (Signed)
Clozapine [facility] called. Patient registered. Labs acceptable. Ok to continue Clozapine BPittmanRPh 02/23/14 3 PM

## 2014-02-23 NOTE — ED Notes (Signed)
Pt assisted to BSC and back to bed 

## 2014-02-23 NOTE — ED Notes (Addendum)
POC occult blood negative. Hospitalist aware. Pt also given breakfast tray. Pt Lopid dose still not available for ED admin.Pt bolus still infusing, approximately 200 cc remain.

## 2014-02-23 NOTE — H&P (Signed)
Hospitalist Admission History and Physical  Patient name: Oscar Elliott Medical record number: [id number] Date of birth: 11/07/1960 Age: 53 y.o. Gender: male  Primary Care Provider: Anselm JunglingHATCHETT, MARY, NP  Chief Complaint: nausea, vomiting, abd pain  History of Present Illness:This is a 53 y.o. year old male with significant past medical history of CAD s/pSTEMI and stents 10/2013, IDDM, schizophrenia, HTN presenting with nausea, vomiting, abd pain. Patient reports 2 days of generalized nausea, vomiting, abdominal pain, diarrhea. Subjective fevers and chills. Lives in a group home. Nonbilious nonbloody emesis. Does report some? Bloody diarrhea. Generalized abdominal pain. Decreased by mouth intake. Has been taking his meds as prescribed. No chest pain. No diaphoresis.  [facility] temperature 99.3, heart rate in the 80s to 100s, respirations tens to 20s, blood pressure in the 100s to 120s, satting greater than 97% on room air. White blood cell count 6.0. Hemoglobin 16.2, creatinine 1.66, potassium 2.9, platelets 123. Troponin #1 is 0.27. Troponin number 20.24. Glucose 38. EKG with sinus tachycardia and nonspecific T-wave changes. EKG within normal limits. Case discussed [facility]. We'll observe. Continue home regimen. Delta troponin is downtrending.   Assessment and Plan: Oscar Elliott is a 53 y.o. year old male presenting with Nausea, vomiting, diarrhea, abd pain, elevated trop.    Active Problems:   Vomiting and diarrhea   Nausea and vomiting   1- N/V/D/Abd Pain  -cipro and flagyl for GI coverage -stool cx, fecal lactoferrin, C diff, O and P -CT abd and pelvis -hemoccult-hgb stable currently -GI consult as clinically indicated   2- Elevated trop/CAD -delta trop downtrending in ER  -sinus tach s/ non specific findings on EKG  -no active CP though w/ noted abd pain setting of above-atypical finding in setting of baseline CAD s/p recent  stent -case discussed w/ Cards (brackbill) per EDP ([place]) -cont home cards regimen  -cycle CEs  -tele bed   3- hypokalemia -replete -mag level   4- IDDM -hypoglycemic on presentation  -hold home regimen overnight until blood sugars normalizes -q4 CBGs  5- Schizophrenia -stable  -cont home regimen   FEN/GI: NPO  Prophylaxis: sub q heparin  Disposition: pending furhter evaluation  Code Status:Full Code    Patient Active Problem List   Diagnosis Date Noted  . Nausea and vomiting 02/23/2014  . Vomiting and diarrhea 02/22/2014  . Coronary artery disease involving native heart without angina pectoris 12/02/2013  . Hyperlipidemia 12/02/2013  . Tobacco abuse   . Peripheral vascular disease   . Schizophrenia   . Essential hypertension    Past Medical History: Past Medical History  Diagnosis Date  . Type 2 diabetes mellitus   . Schizophrenia   . Essential hypertension   . CAD (coronary artery disease)     a. 10/2013 Lateral STEMI/PCI: LM nl, LAD min irregs, D1 occluded sub branch, LCX min irregs mid, OM large/patent, RCA 14629m (2.75x28 Promus DES).  . Hyperlipidemia   . Peripheral vascular disease     a. 06/2007: s/p L AKA @ [facility]  . History of renal failure     a. 06/2007: in setting of rhabdo post L AKA.  . MI (myocardial infarction)     Past Surgical History: Past Surgical History  Procedure Laterality Date  . Above-the-knee amputation Left   . Left heart catheterization with coronary angiogram N/A 11/20/2013    Procedure: LEFT HEART CATHETERIZATION WITH CORONARY ANGIOGRAM;  Surgeon: Corky CraftsJayadeep S Varanasi, MD;  [facility];  Service:  Cardiovascular;  Laterality: N/A;    Social History: History   Social History  . Marital Status: Single    Spouse Name: N/A    Number of Children: N/A  . Years of Education: N/A   Social History Main Topics  . Smoking status: Former Smoker -- 1.00 packs/day for 20 years    Types: Cigars, Cigarettes    Quit date:  12/13/2013  . Smokeless tobacco: Never Used  . Alcohol Use: No  . Drug Use: No  . Sexual Activity: No   Other Topics Concern  . Not on file   Social History Narrative    Family History: Family History  Problem Relation Age of Onset  . Other      no premature CAD.    Allergies: Allergies  Allergen Reactions  . Penicillins Other (See Comments)    Reaction unknown    Current Facility-Administered Medications  Medication Dose Route Frequency Provider Last Rate Last Dose  . 0.9 %  sodium chloride infusion   Intravenous Continuous Raeford RazorStephen Kohut, MD      . 0.9 % NaCl with KCl 20 mEq/ L  infusion   Intravenous Continuous Doree AlbeeSteven Newton, MD      . ARIPiprazole (ABILIFY) tablet 5 mg  5 mg Oral Daily Doree AlbeeSteven Newton, MD      . aspirin EC tablet 81 mg  81 mg Oral Daily Doree AlbeeSteven Newton, MD      . benztropine (COGENTIN) tablet 1 mg  1 mg Oral QHS Doree AlbeeSteven Newton, MD      . clonazePAM Scarlette Calico(KLONOPIN) tablet 0.5 mg  0.5 mg Oral QHS Doree AlbeeSteven Newton, MD      . cloZAPine (CLOZARIL) tablet 150-500 mg  150-500 mg Oral BID Doree AlbeeSteven Newton, MD      . DULoxetine (CYMBALTA) DR capsule 60 mg  60 mg Oral Daily Doree AlbeeSteven Newton, MD      . gemfibrozil (LOPID) tablet 600 mg  600 mg Oral BID AC Doree AlbeeSteven Newton, MD      . heparin injection 5,000 Units  5,000 Units Subcutaneous 3 times per day Doree AlbeeSteven Newton, MD      . insulin detemir (LEVEMIR) injection 25 Units  25 Units Subcutaneous Daily Doree AlbeeSteven Newton, MD      . metoprolol (LOPRESSOR) tablet 50 mg  50 mg Oral BID Doree AlbeeSteven Newton, MD      . nitroGLYCERIN (NITROSTAT) SL tablet 0.4 mg  0.4 mg Sublingual Q5 Min x 3 PRN Doree AlbeeSteven Newton, MD      . pantoprazole (PROTONIX) EC tablet 40 mg  40 mg Oral Daily Doree AlbeeSteven Newton, MD      . sodium chloride 0.9 % bolus 1,000 mL  1,000 mL Intravenous Once Raeford RazorStephen Kohut, MD      . sodium chloride 0.9 % injection 3 mL  3 mL Intravenous Q12H Doree AlbeeSteven Newton, MD      . tamsulosin [facility](FLOMAX) capsule 0.4 mg  0.4 mg Oral Daily Doree AlbeeSteven Newton, MD      .  ticagrelor [facility](BRILINTA) tablet 90 mg  90 mg Oral BID Doree AlbeeSteven Newton, MD       Current Outpatient Prescriptions  Medication Sig Dispense Refill  . amLODipine (NORVASC) 5 MG tablet Take 5 mg by mouth daily.    . ARIPiprazole (ABILIFY) 5 MG tablet Take 5 mg by mouth daily.    Marland Kitchen. aspirin EC 81 MG tablet Take 81 mg by mouth daily.    Marland Kitchen. atenolol (TENORMIN) 50 MG tablet Take 50 mg by mouth 2 (two) times daily.    .Marland Kitchen  atorvastatin (LIPITOR) 80 MG tablet Take 1 tablet (80 mg total) by mouth daily at 6 PM. 30 tablet 11  . benztropine (COGENTIN) 1 MG tablet Take 1 mg by mouth at bedtime.    . clonazePAM (KLONOPIN) 0.5 MG tablet Take 0.5 mg by mouth at bedtime.    . cloZAPine (CLOZARIL) 100 MG tablet Take 150-500 mg by mouth 2 (two) times daily. Take 150mg  by mouth in AM and 500mg  by mouth in PM    . docusate sodium (COLACE) 100 MG capsule Take 100 mg by mouth daily.    . DULoxetine (CYMBALTA) 60 MG capsule Take 60 mg by mouth daily.    Marland Kitchen gemfibrozil (LOPID) 600 MG tablet Take 600 mg by mouth 2 (two) times daily before a meal.    . insulin aspart (NOVOLOG) 100 UNIT/ML injection Inject 10 Units into the skin 3 (three) times daily before meals.    . insulin detemir (LEVEMIR) 100 UNIT/ML injection Inject 35 Units into the skin daily. At lunchtime    . latanoprost (XALATAN) 0.005 % ophthalmic solution Place 1 drop into both eyes at bedtime.    Marland Kitchen loperamide (IMODIUM A-D) 2 MG tablet Take 2 mg by mouth 4 (four) times daily as needed for diarrhea or loose stools.    . metoprolol (LOPRESSOR) 50 MG tablet Take 1 tablet (50 mg total) by mouth 2 (two) times daily. 180 tablet 3  . nitroGLYCERIN (NITROSTAT) 0.4 MG SL tablet Place 1 tablet (0.4 mg total) under the tongue every 5 (five) minutes x 3 doses as needed for chest pain. 25 tablet 12  . omeprazole (PRILOSEC) 40 MG capsule Take 40 mg by mouth 2 (two) times daily.     . tamsulosin (FLOMAX) 0.4 MG CAPS capsule Take 0.4 mg by mouth daily.     . ticagrelor (BRILINTA) 90  MG TABS tablet Take 1 tablet (90 mg total) by mouth 2 (two) times daily. 60 tablet 11  . nicotine polacrilex (EQ NICOTINE) 4 MG gum Take 1 each (4 mg total) by mouth as needed for smoking cessation. (Patient not taking: Reported on 02/22/2014) 100 tablet 0   Review Of Systems: 12 point ROS negative except as noted above in HPI.  Physical Exam: Filed Vitals:   02/22/14 1853  BP: 117/78  Pulse: 95  Temp:   Resp: 20    General: alert and cooperative HEENT: PERRLA and extra ocular movement intact Heart: S1, S2 normal, no murmur, rub or gallop, regular rate and rhythm Lungs: clear to auscultation, no wheezes or rales and unlabored breathing Abdomen: + bowel sounds, mild generalized abd TTP  Extremities: extremities normal, atraumatic, no cyanosis or edema Skin:no rashes, no ecchymoses Neurology: normal without focal findings  Labs and Imaging: Lab Results  Component Value Date/Time   NA 141 02/22/2014 04:41 PM   K 2.9* 02/22/2014 04:41 PM   CL 108 02/22/2014 04:41 PM   CO2 22 02/22/2014 04:41 PM   BUN 32* 02/22/2014 04:41 PM   CREATININE 1.66* 02/22/2014 04:41 PM   GLUCOSE 38* 02/22/2014 04:41 PM   Lab Results  Component Value Date   WBC 6.0 02/22/2014   HGB 16.2 02/22/2014   HCT 46.1 02/22/2014   MCV 86.5 02/22/2014   PLT 123* 02/22/2014    Dg Chest 2 View  02/22/2014   CLINICAL DATA:  Dizziness and lethargy  EXAM: CHEST  2 VIEW  COMPARISON:  November 20, 2013  FINDINGS: There is no edema or consolidation. The heart size is upper normal with  pulmonary vascularity within normal limits. No adenopathy. No bone lesions.  IMPRESSION: No edema or consolidation.   Electronically Signed   By: Bretta Bang M.D.   On: 02/22/2014 18:46           Doree Albee MD  Pager: [phone]

## 2014-02-24 DIAGNOSIS — I2510 Atherosclerotic heart disease of native coronary artery without angina pectoris: Secondary | ICD-10-CM

## 2014-02-24 DIAGNOSIS — R778 Other specified abnormalities of plasma proteins: Secondary | ICD-10-CM | POA: Diagnosis present

## 2014-02-24 DIAGNOSIS — R7989 Other specified abnormal findings of blood chemistry: Secondary | ICD-10-CM | POA: Diagnosis present

## 2014-02-24 DIAGNOSIS — F209 Schizophrenia, unspecified: Secondary | ICD-10-CM

## 2014-02-24 DIAGNOSIS — A047 Enterocolitis due to Clostridium difficile: Principal | ICD-10-CM

## 2014-02-24 LAB — GLUCOSE, CAPILLARY
GLUCOSE-CAPILLARY: 113 mg/dL — AB (ref 70–99)
GLUCOSE-CAPILLARY: 113 mg/dL — AB (ref 70–99)
GLUCOSE-CAPILLARY: 139 mg/dL — AB (ref 70–99)
GLUCOSE-CAPILLARY: 141 mg/dL — AB (ref 70–99)
Glucose-Capillary: 123 mg/dL — ABNORMAL HIGH (ref 70–99)
Glucose-Capillary: 124 mg/dL — ABNORMAL HIGH (ref 70–99)

## 2014-02-24 LAB — COMPREHENSIVE METABOLIC PANEL
ALBUMIN: 2.8 g/dL — AB (ref 3.5–5.2)
ALT: 22 U/L (ref 0–53)
AST: 21 U/L (ref 0–37)
Alkaline Phosphatase: 55 U/L (ref 39–117)
Anion gap: 8 (ref 5–15)
BILIRUBIN TOTAL: 0.6 mg/dL (ref 0.3–1.2)
BUN: 16 mg/dL (ref 6–23)
CO2: 22 mmol/L (ref 19–32)
CREATININE: 1.15 mg/dL (ref 0.50–1.35)
Calcium: 7.8 mg/dL — ABNORMAL LOW (ref 8.4–10.5)
Chloride: 109 mEq/L (ref 96–112)
GFR calc Af Amer: 82 mL/min — ABNORMAL LOW (ref >90)
GFR calc non Af Amer: 70 mL/min — ABNORMAL LOW (ref >90)
GLUCOSE: 130 mg/dL — AB (ref 70–99)
Potassium: 2.8 mmol/L — ABNORMAL LOW (ref 3.5–5.1)
Sodium: 139 mmol/L (ref 135–145)
TOTAL PROTEIN: 5.5 g/dL — AB (ref 6.0–8.3)

## 2014-02-24 LAB — CBC WITH DIFFERENTIAL/PLATELET
BASOS ABS: 0 10*3/uL (ref 0.0–0.1)
BASOS PCT: 0 % (ref 0–1)
Eosinophils Absolute: 0.1 10*3/uL (ref 0.0–0.7)
Eosinophils Relative: 1 % (ref 0–5)
HEMATOCRIT: 40.1 % (ref 39.0–52.0)
HEMOGLOBIN: 13.6 g/dL (ref 13.0–17.0)
Lymphocytes Relative: 18 % (ref 12–46)
Lymphs Abs: 1.3 10*3/uL (ref 0.7–4.0)
MCH: 29.6 pg (ref 26.0–34.0)
MCHC: 33.9 g/dL (ref 30.0–36.0)
MCV: 87.4 fL (ref 78.0–100.0)
MONO ABS: 0.5 10*3/uL (ref 0.1–1.0)
Monocytes Relative: 7 % (ref 3–12)
NEUTROS ABS: 5.2 10*3/uL (ref 1.7–7.7)
Neutrophils Relative %: 73 % (ref 43–77)
Platelets: 102 10*3/uL — ABNORMAL LOW (ref 150–400)
RBC: 4.59 MIL/uL (ref 4.22–5.81)
RDW: 16.3 % — AB (ref 11.5–15.5)
WBC: 7 10*3/uL (ref 4.0–10.5)

## 2014-02-24 LAB — FECAL LACTOFERRIN, QUANT: Fecal Lactoferrin: POSITIVE

## 2014-02-24 LAB — MAGNESIUM: Magnesium: 2.2 mg/dL (ref 1.5–2.5)

## 2014-02-24 MED ORDER — METRONIDAZOLE 500 MG PO TABS
500.0000 mg | ORAL_TABLET | Freq: Three times a day (TID) | ORAL | Status: DC
  Administered 2014-02-24 – 2014-02-26 (×8): 500 mg via ORAL
  Filled 2014-02-24 (×7): qty 1

## 2014-02-24 MED ORDER — POTASSIUM CHLORIDE CRYS ER 20 MEQ PO TBCR
40.0000 meq | EXTENDED_RELEASE_TABLET | ORAL | Status: AC
  Administered 2014-02-24 (×3): 40 meq via ORAL
  Filled 2014-02-24 (×3): qty 2

## 2014-02-24 MED ORDER — METRONIDAZOLE 500 MG PO TABS
500.0000 mg | ORAL_TABLET | Freq: Three times a day (TID) | ORAL | Status: DC
  Filled 2014-02-24: qty 1

## 2014-02-24 NOTE — Evaluation (Signed)
Occupational Therapy Evaluation Patient Details Name: Oscar Elliott MRN: [id number] DOB: 11/07/1960 Today's Date: 02/24/2014    History of Present Illness 53 y.o. year old male with significant past medical history of CAD s/pSTEMI and stents 10/2013, IDDM, schizophrenia, HTN presenting with nausea, vomiting, abd pain. Patient reports 2 days of generalized nausea, vomiting, abdominal pain, diarrhea. Subjective fevers and chills.   Clinical Impression   Patient in bed upon therapy arrival. Agreeable to complete OT evaluation. Pt reports that he is fairly independent at group home and only requires assistance to wash his back. Pt reports that he does not walk much at home. Pt does not have any concerns about returning to group home.     Follow Up Recommendations  No OT follow up    Equipment Recommendations  None recommended by OT    Recommendations for Other Services       Precautions / Restrictions Precautions Precautions: Fall Precaution Comments: Left AKA. Prothesis      Mobility Bed Mobility Overal bed mobility: Independent                Transfers Overall transfer level: Independent               General transfer comment: Close supervision given for safety purposes during evaulation. [facility].         ADL Overall ADL's : Independent                                                       Pertinent Vitals/Pain Pain Assessment: No/denies pain     Hand Dominance Right   Extremity/Trunk Assessment Upper Extremity Assessment Upper Extremity Assessment: Overall WFL for tasks assessed   Lower Extremity Assessment Lower Extremity Assessment: Defer to PT evaluation       Communication Communication Communication: No difficulties   Cognition Arousal/Alertness: Awake/alert Behavior During Therapy: WFL for tasks assessed/performed Overall Cognitive Status: Within Functional  Limits for tasks assessed (History of mental illness)                                Home Living Family/patient expects to be discharged to:: Group home                   Bathroom Shower/Tub: Chief Strategy OfficerTub/shower unit   Bathroom Toilet: Handicapped height Bathroom Accessibility: Yes How Accessible: Accessible via walker Home Equipment: Wheelchair - manual;Walker - standard;Grab bars - tub/shower;Grab bars - toilet   Additional Comments: Min Assist bathing, Pt bathes and does not shower. Staff cookes, cleans, and completes yard work. Patient has been a resident for 30 years.      Prior Functioning/Environment Level of Independence: Needs assistance    ADL's / Homemaking Assistance Needed: Pt reports that he gets assistance to wash his back.                       Barriers to D/C:  None             End of Session Nurse Communication: Other (comment) (Pt requested ice cream. Unable to have due to liquid diet.)  Activity Tolerance: Patient tolerated treatment well Patient left: in bed;with bed alarm set;with call bell/phone within reach   Time:  [id number] OT Time Calculation (min): 15 min Charges:  OT General Charges $OT Visit: 1 Procedure OT Evaluation $Initial OT Evaluation Tier I: 1 Procedure G-Codes:    Limmie PatriciaLaura Daquisha Clermont, OTR/L,CBIS  [phone]  02/24/2014, 9:24 AM

## 2014-02-24 NOTE — Evaluation (Signed)
Physical Therapy Evaluation Patient Details Name: Oscar Elliott MRN: [id number] DOB: 11/07/1960 Today's Date: 02/24/2014   History of Present Illness  53 y.o. year old male with significant past medical history of CAD s/pSTEMI and stents 10/2013, IDDM, schizophrenia, HTN presenting with nausea, vomiting, abd pain. Patient reports 2 days of generalized nausea, vomiting, abdominal pain, diarrhea. Subjective fevers and chills.  He has  Been a resident of a Family Home for 30 years and is independent in gait with his prosthetic leg and walker.  He does use a w/c for a great deal of his mobility.  Clinical Impression   Pt was seen for evaluation.  He was found to be alert and very cooperative, able to follow all directions.  He is currently at functional baseline and should not need any further PT.  I will ask nursing service to ambulate pt in the hallway with his walker and prosthetic leg.    Follow Up Recommendations No PT follow up    Equipment Recommendations  None recommended by PT    Recommendations for Other Services   none    Precautions / Restrictions Precautions Precautions: Fall Precaution Comments: Left AKA. Prothesis Restrictions Weight Bearing Restrictions: No      Mobility  Bed Mobility Overal bed mobility: Independent                Transfers Overall transfer level: Independent               General transfer comment: Close supervision given for safety purposes during evaulation. [facility].  Ambulation/Gait Ambulation/Gait assistance: Modified independent (Device/Increase time) Ambulation Distance (Feet): 100 Feet Assistive device: Standard walker Gait Pattern/deviations: Step-to pattern   Gait velocity interpretation: at or above normal speed for age/gender General Gait Details: takes a very long step with his prosthetic leg but is unwilling to change this habit  Stairs            Wheelchair  Mobility    Modified Rankin (Stroke Patients Only)       Balance Overall balance assessment: No apparent balance deficits (not formally assessed)                                           Pertinent Vitals/Pain Pain Assessment: No/denies pain    Home Living Family/patient expects to be discharged to:: Group home Living Arrangements: Other (Comment);Other relatives             Home Equipment: Wheelchair - manual;Walker - standard;Grab bars - tub/shower;Grab bars - toilet Additional Comments: Min Assist bathing, Pt bathes and does not shower. Staff cookes, cleans, and completes yard work. Patient has been a resident for 30 years.    Prior Function Level of Independence: Needs assistance      ADL's / Homemaking Assistance Needed: Pt reports that he gets assistance to wash his back.        Hand Dominance   Dominant Hand: Right    Extremity/Trunk Assessment   Upper Extremity Assessment: Defer to OT evaluation           Lower Extremity Assessment: Overall WFL for tasks assessed      Cervical / Trunk Assessment: Normal  Communication   Communication: No difficulties  Cognition Arousal/Alertness: Awake/alert Behavior During Therapy: WFL for tasks assessed/performed Overall Cognitive Status: Within Functional Limits for tasks assessed  General Comments      Exercises        Assessment/Plan    PT Assessment Patent does not need any further PT services  PT Diagnosis     PT Problem List    PT Treatment Interventions     PT Goals (Current goals can be found in the Care Plan section) Acute Rehab PT Goals PT Goal Formulation: All assessment and education complete, DC therapy    Frequency     Barriers to discharge  none      Co-evaluation               End of Session Equipment Utilized During Treatment: Gait belt Activity Tolerance: Patient tolerated treatment well Patient left: in chair;with  call bell/phone within reach;with chair alarm set Nurse Communication: Mobility status         Time: [id number] PT Time Calculation (min) (ACUTE ONLY): 34 min   Charges:   PT Evaluation $Initial PT Evaluation Tier I: 1 Procedure     PT G CodesManson Passey:        Nasteho Glantz L 02/24/2014, 10:05 AM

## 2014-02-24 NOTE — Clinical Social Work Psychosocial (Signed)
Clinical Social Work Department BRIEF PSYCHOSOCIAL ASSESSMENT 02/24/2014  Patient:  Oscar Elliott, Oscar Elliott     Account Number:  [id number]     Admit date:  02/22/2014  Clinical Social Worker:  Wyatt Haste  Date/Time:  02/24/2014 10:28 AM  Referred by:  CSW  Date Referred:  02/24/2014 Referred for  ALF Placement   Other Referral:   Interview type:  Patient Other interview type:   Oscar Elliott- at [place] Status:  FACILITY Admitted from facility:  Other Level of care:  Family Care Home Primary support name:  Lonnie Primary support relationship to patient:  NONE Degree of support available:   family has no involvement    CURRENT CONCERNS Current Concerns  Post-Acute Placement   Other Concerns:    SOCIAL WORK ASSESSMENT / PLAN CSW met with pt at bedside. Pt alert and oriented x3 and reports he has been a longterm resident at [facility]. He reports he has two sisters and a brother, but they are not involved with him. He was clear that he did not want CSW to try to contact them. Pt came to ED for evaluation of stomach pain and admitted with cdiff colitis with ileus. Pt indicates that his leg was amputated about 5 years ago. Pt requests to return to [facility] and reports he is happy there. Per Oscar Elliott at facility, pt has been a resident there for 15 years. He requires assist with bathing and standy by assist with ambulating. Pt is okay to return at d/c and is considered "part of the family" to them. He is seen regularly by psychiatrist at [facility] due to diagnosis of schizophrenia.   Assessment/plan status:  Psychosocial Support/Ongoing Assessment of Needs Other assessment/ plan:   Information/referral to community resources:   [facility]    PATIENT'S/FAMILY'S RESPONSE TO PLAN OF CARE: Pt will return to [facility] when medically stable. Oscar Elliott was notified of cdiff. PT evaluated pt today and no follow up is needed.        [name], [name]

## 2014-02-24 NOTE — Progress Notes (Signed)
Progress reviewed  No evid for active coronary ischemia.  Trop minimally elevated in setting of probable colitis. Would continue medical Rx.  ASA and Brilinta. I would not plan any other cardiac evaluation at that time unless symptoms change Call with questions.

## 2014-02-24 NOTE — Care Management Note (Addendum)
    Page 1 of 1   02/26/2014     3:12:56 PM CARE MANAGEMENT NOTE 02/26/2014  Patient:  Oscar Elliott,Oscar Elliott   Account Number:  [id number]  Date Initiated:  02/24/2014  Documentation initiated by:  Kathyrn SheriffHILDRESS,JESSICA  Subjective/Objective Assessment:   Pt [facility] with abdominal pain. [facility] Pt has a prosthesis but no other DME's. [facility] CSW aware of dishcarge plan and will arrange for placement back at facility. No CM needs identified.     Action/Plan:   Anticipated DC Date:  02/26/2014   Anticipated DC Plan:  ASSISTED LIVING / REST HOME  In-house referral  Clinical Social Worker      DC Planning Services  CM consult      Choice offered to / List presented to:             Status of service:  Completed, signed off Medicare Important Message given?  YES (If response is "NO", the following Medicare IM given date fields will be blank) Date Medicare IM given:  02/24/2014 Medicare IM given by:  Kathyrn SheriffHILDRESS,JESSICA Date Additional Medicare IM given:  02/26/2014 Additional Medicare IM given by:  Anibal HendersonGENEVA Caelen Higinbotham  Discharge Disposition:  ASSISTED LIVING  Per UR Regulation:    If discussed at Long Length of Stay Meetings, dates discussed:    Comments:   02/26/14 1100 Amarya Kuehl RN/CM 02/24/2014 0730 Kathyrn SheriffJessica Childress, RN, MSN, [facility]

## 2014-02-24 NOTE — Progress Notes (Signed)
[facility] PROGRESS NOTE  Clance Bollndrew Totino ZOX:[id number] DOB: 11/07/1960 DOA: 02/22/2014 PCP: Anselm JunglingHATCHETT, MARY, NP  Assessment/Plan: #1 C. difficile colitis Patient had presented with nausea vomiting abdominal pain and diarrhea. Clinical improvement. Patient with no further nausea vomiting impression tolerating clear liquid diet. Patient still with abdominal pain. C. difficile PCR was positive.  IV ciprofloxacin has been discontinued. Change IV Flagyl to oral Flagyl to continue probiotic. IV fluids. Supportive care. Will advance to a full liquid diet in the morning for breakfast and see how patient tolerates this.  #2 abdominal pain Secondary to problem #1.  #3 elevated troponin/coronary artery disease Patient was noted to have a sinus tachycardia with nonspecific EKG findings. Patient denies any acute chest pain. Patient does have a history of coronary artery disease status post stent placement. Case was discussed with cardiology and it was felt that patient had no evidence of active coronary ischemia. It was felt patient's troponin was minimally elevated in the setting of probable colitis. Was recommended to continue medical treatment with aspirin) antigen. Outpatient follow-up.  #4 hypokalemia Secondary to GI losses secondary to problem #1. Keep magnesium greater than 2. Replete.  #5 well-controlled diabetes mellitus Hemoglobin A1c is 5.8. Continue sliding scale insulin.  #6 history of schizophrenia Continue Abilify, Clozaril, Cymbalta, Cogentin.  #7 prophylaxis Protonix for GI prophylaxis. Heparin for DVT prophylaxis.   Code Status: Full Family Communication: Updated patient and family present. Disposition Plan: Home when medically stable.   Consultants:  Cardiology: Dr. Tenny Crawoss  Procedures:  CT abdomen and pelvis 02/23/2014  Chest x-ray 02/22/2014  Antibiotics:  IV ciprofloxacin 02/23/2014>>>>> 02/23/2014  IV Flagyl 02/23/2014>>>>> 02/24/2014  Oral Flagyl  02/24/2014  HPI/Subjective: Patient denies any emesis, no nausea. Patient still complaining of abdominal pain. Patient tolerating clear liquids.  Objective: Filed Vitals:   02/24/14 1052  BP: 105/69  Pulse: 92  Temp:   Resp:     Intake/Output Summary (Last 24 hours) at 02/24/14 1211 Last data filed at 02/24/14 0610  Gross per 24 hour  Intake 1867.5 ml  Output    600 ml  Net 1267.5 ml   Filed Weights   02/22/14 1631 02/23/14 0907  Weight: 72.576 kg (160 lb) 58.378 kg (128 lb 11.2 oz)    Exam:   General:  nad  Cardiovascular: RRR  Respiratory: Clear to auscultation bilaterally. No wheezing, no crackles, rhonchi.  Abdomen: Soft, diffuse tenderness to palpation, positive bowel sounds, nondistended, no rebound, no guarding.  Musculoskeletal: Status post left AKA. Right lower extremity no edema.  Data Reviewed: Basic Metabolic Panel:  Recent Labs Lab 02/22/14 1641 02/22/14 1938 02/23/14 0706 02/24/14 0605 02/24/14 0611  NA 141  --  137  --  139  K 2.9*  --  3.2*  --  2.8*  CL 108  --  107  --  109  CO2 22  --  21  --  22  GLUCOSE 38*  --  121*  --  130*  BUN 32*  --  27*  --  16  CREATININE 1.66*  --  1.38*  --  1.15  CALCIUM 8.5  --  8.2*  --  7.8*  MG  --  2.1  --  2.2  --    Liver Function Tests:  Recent Labs Lab 02/23/14 0706 02/24/14 0611  AST 26 21  ALT 25 22  ALKPHOS 63 55  BILITOT 0.8 0.6  PROT 6.7 5.5*  ALBUMIN 3.3* 2.8*    Recent Labs Lab 02/22/14 1641  LIPASE 12  No results for input(s): AMMONIA in the last 168 hours. CBC:  Recent Labs Lab 02/22/14 1641 02/23/14 0706 02/24/14 0611  WBC 6.0 7.2 7.0  NEUTROABS  --  5.4 5.2  HGB 16.2 15.2 13.6  HCT 46.1 44.2 40.1  MCV 86.5 87.0 87.4  PLT 123* 108* 102*   Cardiac Enzymes:  Recent Labs Lab 02/22/14 1641 02/22/14 2147 02/23/14 0054 02/23/14 0706 02/23/14 1242  TROPONINI 0.27* 0.24* 0.25* 0.24* 0.21*   BNP (last 3 results) No results for input(s): PROBNP in the  last 8760 hours. CBG:  Recent Labs Lab 02/23/14 2026 02/24/14 0032 02/24/14 0438 02/24/14 0813 02/24/14 1144  GLUCAP 134* 139* 123* 113* 141*    Recent Results (from the past 240 hour(s))  Stool culture     Status: None (Preliminary result)   Collection Time: 02/23/14  8:20 AM  Result Value Ref Range Status   Specimen Description STOOL  Final   Special Requests NONE  Final   Culture   Final    Culture reincubated for better growth Performed at [facility]    Report Status PENDING  Incomplete  Clostridium Difficile by PCR     Status: Abnormal   Collection Time: 02/23/14  8:20 AM  Result Value Ref Range Status   C difficile by pcr POSITIVE (A) NEGATIVE Final    Comment: CRITICAL RESULT CALLED TO, READ BACK BY AND VERIFIED WITH: COFFEY,J. AT 1253 ON 02/23/2014 BY BAUGHAM,M.      Studies: Ct Abdomen Pelvis Wo Contrast  02/23/2014   CLINICAL DATA:  Nausea/vomiting, abdominal pain, diarrhea.  EXAM: CT ABDOMEN AND PELVIS WITHOUT CONTRAST  TECHNIQUE: Multidetector CT imaging of the abdomen and pelvis was performed following the standard protocol without IV contrast.  COMPARISON:  06/21/2011  FINDINGS: Lower chest:  Mild scarring at the lung bases.  Hepatobiliary: Liver is within normal limits.  Layering gallstones (series 2/image 28). No intrahepatic or extrahepatic ductal dilatation.  Pancreas: Within normal limits.  Spleen: Within normal limits.  Adrenals/Urinary Tract: Stable low-density thickening of the bilateral adrenal glands, likely reflecting benign adrenal hyperplasia.  7 mm nonobstructing left lower pole renal calculus (series 2/ image 40). Right kidney is within normal limits. No hydronephrosis.  No ureteral or bladder calculi.  Bladder is within normal limits.  Stomach/Bowel: Stomach is unremarkable.  Multiple dilated loops of proximal/mid small bowel, without discrete focal transition. Distal/terminal ileum is decompressed (series 2/image 24). This appearance favors  adynamic small bowel ileus over small bowel obstruction given the associated colonic findings (below).  Wall thickening involving in the ascending and proximal transverse colon (series 2/ image 37), suspicious for infectious/inflammatory colitis.  No drainable fluid collection/abscess.  No free air.  Vascular/Lymphatic: Fusiform ectasia of the infrarenal abdominal aorta measuring 2.9 x 2.5 cm (series 2/ image 47). 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  No suspicious abdominopelvic lymphadenopathy.  Reproductive: Prostate is notable for dystrophic calcifications.  Other: No abdominopelvic ascites.  Musculoskeletal: Degenerative changes of the visualized thoracolumbar spine.  IMPRESSION: Wall thickening involving the ascending and proximal transverse colon, suspicious for infectious/inflammatory colitis.  Multiple loops of proximal/mid small bowel, favored to reflect adynamic small bowel ileus, less likely partial small bowel obstruction.  Cholelithiasis, without associated inflammatory changes.  7 mm nonobstructing left lower pole renal calculus. No ureteral or bladder calculi. No hydronephrosis.   Electronically Signed   By: Charline BillsSriyesh  Krishnan M.D.   On: 02/23/2014 02:54   Dg Chest 2 View  02/22/2014   CLINICAL  DATA:  Dizziness and lethargy  EXAM: CHEST  2 VIEW  COMPARISON:  November 20, 2013  FINDINGS: There is no edema or consolidation. The heart size is upper normal with pulmonary vascularity within normal limits. No adenopathy. No bone lesions.  IMPRESSION: No edema or consolidation.   Electronically Signed   By: Bretta Bang M.D.   On: 02/22/2014 18:46    Scheduled Meds: . ARIPiprazole  5 mg Oral Daily  . aspirin EC  81 mg Oral Daily  . benztropine  1 mg Oral QHS  . clonazePAM  0.5 mg Oral QHS  . cloZAPine  150 mg Oral Daily  . cloZAPine  500 mg Oral QHS  . DULoxetine  60 mg Oral Daily  . gemfibrozil  600 mg Oral BID AC  . heparin  5,000 Units Subcutaneous 3 times  per day  . insulin aspart  0-9 Units Subcutaneous 6 times per day  . metoprolol  50 mg Oral BID  . metroNIDAZOLE  500 mg Oral 3 times per day  . pantoprazole  40 mg Oral Daily  . potassium chloride  40 mEq Oral Q4H  . saccharomyces boulardii  250 mg Oral BID  . sodium chloride  3 mL Intravenous Q12H  . tamsulosin  0.4 mg Oral Daily  . ticagrelor  90 mg Oral BID   Continuous Infusions: . 0.9 % NaCl with KCl 20 mEq / L 75 mL/hr at 02/24/14 1049    Principal Problem:   C. difficile colitis Active Problems:   Tobacco abuse   Schizophrenia   Essential hypertension   Coronary artery disease involving native heart without angina pectoris   Hyperlipidemia   Vomiting and diarrhea   Nausea and vomiting   Abdominal pain   Hypokalemia   Elevated troponin    Time spent: 35 minutes    Jahmez Bily M.D. [facility] Pager [phone]. If 7PM-7AM, please contact night-coverage at [email or web address], password [facility] 02/24/2014, 12:11 PM  LOS: 2 days

## 2014-02-25 DIAGNOSIS — I10 Essential (primary) hypertension: Secondary | ICD-10-CM

## 2014-02-25 LAB — CBC WITH DIFFERENTIAL/PLATELET
BASOS PCT: 1 % (ref 0–1)
Basophils Absolute: 0 10*3/uL (ref 0.0–0.1)
EOS ABS: 0 10*3/uL (ref 0.0–0.7)
Eosinophils Relative: 1 % (ref 0–5)
HCT: 37.9 % — ABNORMAL LOW (ref 39.0–52.0)
Hemoglobin: 12.8 g/dL — ABNORMAL LOW (ref 13.0–17.0)
LYMPHS PCT: 21 % (ref 12–46)
Lymphs Abs: 1.9 10*3/uL (ref 0.7–4.0)
MCH: 29.4 pg (ref 26.0–34.0)
MCHC: 33.8 g/dL (ref 30.0–36.0)
MCV: 86.9 fL (ref 78.0–100.0)
MONOS PCT: 7 % (ref 3–12)
Monocytes Absolute: 0.7 10*3/uL (ref 0.1–1.0)
Neutro Abs: 6.2 10*3/uL (ref 1.7–7.7)
Neutrophils Relative %: 70 % (ref 43–77)
Platelets: 113 10*3/uL — ABNORMAL LOW (ref 150–400)
RBC: 4.36 MIL/uL (ref 4.22–5.81)
RDW: 16.5 % — ABNORMAL HIGH (ref 11.5–15.5)
WBC: 8.8 10*3/uL (ref 4.0–10.5)

## 2014-02-25 LAB — GLUCOSE, CAPILLARY
GLUCOSE-CAPILLARY: 104 mg/dL — AB (ref 70–99)
GLUCOSE-CAPILLARY: 151 mg/dL — AB (ref 70–99)
GLUCOSE-CAPILLARY: 145 mg/dL — AB (ref 70–99)
Glucose-Capillary: 100 mg/dL — ABNORMAL HIGH (ref 70–99)
Glucose-Capillary: 101 mg/dL — ABNORMAL HIGH (ref 70–99)
Glucose-Capillary: 192 mg/dL — ABNORMAL HIGH (ref 70–99)

## 2014-02-25 LAB — COMPREHENSIVE METABOLIC PANEL
ALT: 21 U/L (ref 0–53)
AST: 20 U/L (ref 0–37)
Albumin: 2.7 g/dL — ABNORMAL LOW (ref 3.5–5.2)
Alkaline Phosphatase: 56 U/L (ref 39–117)
Anion gap: 6 (ref 5–15)
BILIRUBIN TOTAL: 0.5 mg/dL (ref 0.3–1.2)
BUN: 10 mg/dL (ref 6–23)
CALCIUM: 7.9 mg/dL — AB (ref 8.4–10.5)
CO2: 20 mmol/L (ref 19–32)
CREATININE: 0.9 mg/dL (ref 0.50–1.35)
Chloride: 115 mEq/L — ABNORMAL HIGH (ref 96–112)
GFR calc Af Amer: >90 mL/min (ref >90)
Glucose, Bld: 119 mg/dL — ABNORMAL HIGH (ref 70–99)
Potassium: 3.9 mmol/L (ref 3.5–5.1)
Sodium: 141 mmol/L (ref 135–145)
TOTAL PROTEIN: 5.3 g/dL — AB (ref 6.0–8.3)

## 2014-02-25 LAB — MAGNESIUM: MAGNESIUM: 2.2 mg/dL (ref 1.5–2.5)

## 2014-02-25 NOTE — Progress Notes (Signed)
[facility] PROGRESS NOTE  Clance Bollndrew Eskelson AVW:[id number] DOB: 11/07/1960 DOA: 02/22/2014 PCP: Anselm JunglingHATCHETT, MARY, NP  Assessment/Plan: #1 C. difficile colitis Patient had presented with nausea vomiting abdominal pain and diarrhea. Clinical improvement. Patient with no further nausea vomiting and tolerating clear liquid diet. Patient still with abdominal pain. Patient with 4 large loose BM overnight. C. difficile PCR is positive.  IV ciprofloxacin has been discontinued. Continue oral Flagyl and probiotic. IV fluids. Supportive care. Continue full liquid diet through today and advance to a soft diet for breakfast in the morning and see how patient tolerates this.  #2 abdominal pain Secondary to problem #1.Slowly improving.  #3 elevated troponin/coronary artery disease Patient was noted to have a sinus tachycardia with nonspecific EKG findings. Patient denies any acute chest pain. Patient does have a history of coronary artery disease status post stent placement. Case was discussed with cardiology and it was felt that patient had no evidence of active coronary ischemia. It was felt patient's troponin was minimally elevated in the setting of probable colitis. Was recommended to continue medical treatment with aspirin. Outpatient follow-up.  #4 hypokalemia Secondary to GI losses secondary to problem #1. Keep magnesium greater than 2. Replete.  #5 well-controlled diabetes mellitus Hemoglobin A1c is 5.8. Continue sliding scale insulin.  #6 history of schizophrenia Continue Abilify, Clozaril, Cymbalta, Cogentin.  #7 prophylaxis Protonix for GI prophylaxis. Heparin for DVT prophylaxis.   Code Status: Full Family Communication: Updated patient and family present. Disposition Plan: Group Home when medically stable.   Consultants:  Cardiology: Dr. Tenny Crawoss  Procedures:  CT abdomen and pelvis 02/23/2014  Chest x-ray 02/22/2014  Antibiotics:  IV ciprofloxacin 02/23/2014>>>>>  02/23/2014  IV Flagyl 02/23/2014>>>>> 02/24/2014  Oral Flagyl 02/24/2014  HPI/Subjective: Patient denies any emesis, no nausea. Patient still complaining of abdominal pain, however patient with 4 large BM last night. Patient tolerating full liquids. Patient asking for fried chicken.  Objective: Filed Vitals:   02/25/14 0533  BP: 117/72  Pulse: 98  Temp: 98.5 F (36.9 C)  Resp: 18    Intake/Output Summary (Last 24 hours) at 02/25/14 1115 Last data filed at 02/25/14 1052  Gross per 24 hour  Intake   2435 ml  Output   1875 ml  Net    560 ml   Filed Weights   02/22/14 1631 02/23/14 0907  Weight: 72.576 kg (160 lb) 58.378 kg (128 lb 11.2 oz)    Exam:   General:  nad  Cardiovascular: RRR  Respiratory: Clear to auscultation bilaterally. No wheezing, no crackles, rhonchi.  Abdomen: Soft, decreased diffuse tenderness to palpation, positive bowel sounds, nondistended, no rebound, no guarding.  Musculoskeletal: Status post left AKA. Right lower extremity no edema.  Data Reviewed: Basic Metabolic Panel:  Recent Labs Lab 02/22/14 1641 02/22/14 1938 02/23/14 0706 02/24/14 0605 02/24/14 0611 02/25/14 0551  NA 141  --  137  --  139 141  K 2.9*  --  3.2*  --  2.8* 3.9  CL 108  --  107  --  109 115*  CO2 22  --  21  --  22 20  GLUCOSE 38*  --  121*  --  130* 119*  BUN 32*  --  27*  --  16 10  CREATININE 1.66*  --  1.38*  --  1.15 0.90  CALCIUM 8.5  --  8.2*  --  7.8* 7.9*  MG  --  2.1  --  2.2  --  2.2   Liver Function Tests:  Recent  Labs Lab 02/23/14 0706 02/24/14 0611 02/25/14 0551  AST 26 21 20   ALT 25 22 21   ALKPHOS 63 55 56  BILITOT 0.8 0.6 0.5  PROT 6.7 5.5* 5.3*  ALBUMIN 3.3* 2.8* 2.7*    Recent Labs Lab 02/22/14 1641  LIPASE 12   No results for input(s): AMMONIA in the last 168 hours. CBC:  Recent Labs Lab 02/22/14 1641 02/23/14 0706 02/24/14 0611 02/25/14 0551  WBC 6.0 7.2 7.0 8.8  NEUTROABS  --  5.4 5.2 6.2  HGB 16.2 15.2 13.6  12.8*  HCT 46.1 44.2 40.1 37.9*  MCV 86.5 87.0 87.4 86.9  PLT 123* 108* 102* 113*   Cardiac Enzymes:  Recent Labs Lab 02/22/14 1641 02/22/14 2147 02/23/14 0054 02/23/14 0706 02/23/14 1242  TROPONINI 0.27* 0.24* 0.25* 0.24* 0.21*   BNP (last 3 results) No results for input(s): PROBNP in the last 8760 hours. CBG:  Recent Labs Lab 02/24/14 1715 02/24/14 2028 02/25/14 0006 02/25/14 0409 02/25/14 0743  GLUCAP 113* 124* 101* 104* 100*    Recent Results (from the past 240 hour(s))  Stool culture     Status: None (Preliminary result)   Collection Time: 02/23/14  8:20 AM  Result Value Ref Range Status   Specimen Description STOOL  Final   Special Requests NONE  Final   Culture   Final    Culture reincubated for better growth Performed at [facility]    Report Status PENDING  Incomplete  Clostridium Difficile by PCR     Status: Abnormal   Collection Time: 02/23/14  8:20 AM  Result Value Ref Range Status   C difficile by pcr POSITIVE (A) NEGATIVE Final    Comment: CRITICAL RESULT CALLED TO, READ BACK BY AND VERIFIED WITH: COFFEY,J. AT 1253 ON 02/23/2014 BY BAUGHAM,M.      Studies: No results found.  Scheduled Meds: . ARIPiprazole  5 mg Oral Daily  . aspirin EC  81 mg Oral Daily  . benztropine  1 mg Oral QHS  . clonazePAM  0.5 mg Oral QHS  . cloZAPine  150 mg Oral Daily  . cloZAPine  500 mg Oral QHS  . DULoxetine  60 mg Oral Daily  . gemfibrozil  600 mg Oral BID AC  . heparin  5,000 Units Subcutaneous 3 times per day  . insulin aspart  0-9 Units Subcutaneous 6 times per day  . metoprolol  50 mg Oral BID  . metroNIDAZOLE  500 mg Oral 3 times per day  . pantoprazole  40 mg Oral Daily  . saccharomyces boulardii  250 mg Oral BID  . sodium chloride  3 mL Intravenous Q12H  . tamsulosin  0.4 mg Oral Daily  . ticagrelor  90 mg Oral BID   Continuous Infusions: . 0.9 % NaCl with KCl 20 mEq / L 75 mL/hr at 02/24/14 1535    Principal Problem:   C.  difficile colitis Active Problems:   Abdominal pain   Hypokalemia   Elevated troponin   Tobacco abuse   Schizophrenia   Essential hypertension   Coronary artery disease involving native heart without angina pectoris   Hyperlipidemia   Vomiting and diarrhea   Nausea and vomiting    Time spent: 35 minutes    THOMPSON,DANIEL M.D. [facility] Pager [phone]. If 7PM-7AM, please contact night-coverage at [email or web address], password [email or web address] 02/25/2014, 11:15 AM  LOS: 3 days

## 2014-02-26 LAB — GLUCOSE, CAPILLARY
Glucose-Capillary: 92 mg/dL (ref 70–99)
Glucose-Capillary: 109 mg/dL — ABNORMAL HIGH (ref 70–99)
Glucose-Capillary: 107 mg/dL — ABNORMAL HIGH (ref 70–99)
Glucose-Capillary: 141 mg/dL — ABNORMAL HIGH (ref 70–99)

## 2014-02-26 LAB — CBC WITH DIFFERENTIAL/PLATELET
Basophils Absolute: 0 10*3/uL (ref 0.0–0.1)
Basophils Relative: 0 % (ref 0–1)
EOS ABS: 0.1 10*3/uL (ref 0.0–0.7)
Eosinophils Relative: 1 % (ref 0–5)
HCT: 37.7 % — ABNORMAL LOW (ref 39.0–52.0)
HEMOGLOBIN: 12.6 g/dL — AB (ref 13.0–17.0)
LYMPHS ABS: 2.3 10*3/uL (ref 0.7–4.0)
LYMPHS PCT: 24 % (ref 12–46)
MCH: 29.1 pg (ref 26.0–34.0)
MCHC: 33.4 g/dL (ref 30.0–36.0)
MCV: 87.1 fL (ref 78.0–100.0)
MONOS PCT: 10 % (ref 3–12)
Monocytes Absolute: 1 10*3/uL (ref 0.1–1.0)
Neutro Abs: 6.2 10*3/uL (ref 1.7–7.7)
Neutrophils Relative %: 65 % (ref 43–77)
Platelets: 131 10*3/uL — ABNORMAL LOW (ref 150–400)
RBC: 4.33 MIL/uL (ref 4.22–5.81)
RDW: 16.3 % — ABNORMAL HIGH (ref 11.5–15.5)
WBC: 9.6 10*3/uL (ref 4.0–10.5)

## 2014-02-26 LAB — COMPREHENSIVE METABOLIC PANEL
ALBUMIN: 2.8 g/dL — AB (ref 3.5–5.2)
ALT: 22 U/L (ref 0–53)
AST: 21 U/L (ref 0–37)
Alkaline Phosphatase: 63 U/L (ref 39–117)
Anion gap: 4 — ABNORMAL LOW (ref 5–15)
BILIRUBIN TOTAL: 0.4 mg/dL (ref 0.3–1.2)
BUN: 8 mg/dL (ref 6–23)
CO2: 25 mmol/L (ref 19–32)
Calcium: 8.3 mg/dL — ABNORMAL LOW (ref 8.4–10.5)
Chloride: 114 mEq/L — ABNORMAL HIGH (ref 96–112)
Creatinine, Ser: 1 mg/dL (ref 0.50–1.35)
GFR calc non Af Amer: 83 mL/min — ABNORMAL LOW (ref >90)
Glucose, Bld: 121 mg/dL — ABNORMAL HIGH (ref 70–99)
Potassium: 4 mmol/L (ref 3.5–5.1)
Sodium: 143 mmol/L (ref 135–145)
Total Protein: 5.4 g/dL — ABNORMAL LOW (ref 6.0–8.3)

## 2014-02-26 MED ORDER — INSULIN DETEMIR 100 UNIT/ML ~~LOC~~ SOLN: SUBCUTANEOUS | Status: DC

## 2014-02-26 MED ORDER — METRONIDAZOLE 500 MG PO TABS: 500.0000 mg | ORAL_TABLET | Freq: Three times a day (TID) | ORAL | Status: AC

## 2014-02-26 MED ORDER — SACCHAROMYCES BOULARDII 250 MG PO CAPS: 250.0000 mg | ORAL_CAPSULE | Freq: Two times a day (BID) | ORAL | Status: DC

## 2014-02-26 NOTE — Discharge Summary (Signed)
Discharge Summary  Oscar Elliott AVW:[id number] DOB: [birth year]  PCP: Anselm Jungling, NP  Admit date: 02/22/2014 Discharge date: 02/26/2014  Time spent: 25 minutes  Recommendations for Outpatient Follow-up:  1. New medication: Flagyl 500 mg every 8 hours 12 more days 2. Medication change: Levemir been decreased to 30 units subcutaneous 1 week then return back to 35 units subcutaneous daily 3. Medication change: Patient on atenolol and metoprolol. Atenolol being discontinued  Discharge Diagnoses:  Active Hospital Problems   Diagnosis Date Noted  . C. difficile colitis   . Elevated troponin 02/24/2014  . Nausea and vomiting 02/23/2014  . Abdominal pain   . Hypokalemia   . Vomiting and diarrhea 02/22/2014  . Hyperlipidemia 12/02/2013  . Coronary artery disease involving native heart without angina pectoris 12/02/2013  . Essential hypertension   . Schizophrenia   . Tobacco abuse     Resolved Hospital Problems   Diagnosis Date Noted Date Resolved  No resolved problems to display.    Discharge Condition: Improved, being discharged back to group home  Diet recommendation: Carb modified  Filed Weights   02/22/14 1631 02/23/14 0907  Weight: 72.576 kg (160 lb) 58.378 kg (128 lb 11.2 oz)    History of present illness:  Patient is a 54 year old male with past mental history of CAD, diabetes and schizophrenia who was admitted to the hospitalist service on 12/28 evening with complaints of 2 days of nausea, vomiting, abdominal pain and diarrhea. Initially patient to be dehydrated and initial troponin noted to be elevated at 0.27. After discussion with cardiology, decision was made to keep patient at [facility] and monitor closely.  Hospital Course:  Principal Problem:   C. difficile colitis with secondary symptoms of nausea, vomiting, diarrhea and abdominal pain: Initially patient started on Cipro and Flagyl for good coverage. C. difficile cultures came back positive and  patient changed to Flagyl only. It will transition over to by mouth on 12/31 and patient will be discharged on 4 more days to complete a two-week course. Active Problems:   Tobacco abuse: On nicotine patch.    Schizophrenia: Patient continued on home meds of Abilify, Clozaril, Cogentin and Cymbalta    Essential hypertension   Coronary artery disease involving native heart without angina pectoris   Hyperlipidemia: Patient will resume statin on discharge    Hypokalemia: Secondary to GI losses from C. difficile colitis. Replaced.    Elevated troponin: EKG on admission noted sinus tachycardia. No chest pain. Cardiology felt that patient had no evidence of active coronary ischemia and this is more minimally elevated in the setting of probable colitis and felt to be lab abnormality rather than any type of acute coronary syndrome   Procedures:  None  Consultations:  Case discussed with cardiology  Discharge Exam: BP 120/72 mmHg  Pulse 93  Temp(Src) 99.1 F (37.3 C) (Oral)  Resp 19  Ht  (1.575 m)  Wt 58.378 kg (128 lb 11.2 oz)  BMI 23.53 kg/m2  SpO2 100%  General: Alert and oriented 3, no acute distress Cardiovascular: Regular rate and rhythm, S1-S2 Respiratory: Clear to auscultation bilaterally  Discharge Instructions You were cared for by a hospitalist during your hospital stay. If you have any questions about your discharge medications or the care you received while you were in the hospital after you are discharged, you can call the unit and asked to speak with the hospitalist on call if the hospitalist that took care of you is not available. Once you are discharged, your  primary care physician will handle any further medical issues. Please note that NO REFILLS for any discharge medications will be authorized once you are discharged, as it is imperative that you return to your primary care physician (or establish a relationship with a primary care physician if you do not have  one) for your aftercare needs so that they can reassess your need for medications and monitor your lab values.  Discharge Instructions    Diet Carb Modified    Complete by:  As directed      Increase activity slowly    Complete by:  As directed             Medication List    STOP taking these medications        atenolol 50 MG tablet  Commonly known as:  TENORMIN     loperamide 2 MG tablet  Commonly known as:  IMODIUM A-D     nicotine polacrilex 4 MG gum  Commonly known as:  EQ NICOTINE      TAKE these medications        amLODipine 5 MG tablet  Commonly known as:  NORVASC  Take 5 mg by mouth daily.     ARIPiprazole 5 MG tablet  Commonly known as:  ABILIFY  Take 5 mg by mouth daily.     aspirin EC 81 MG tablet  Take 81 mg by mouth daily.     atorvastatin 80 MG tablet  Commonly known as:  LIPITOR  Take 1 tablet (80 mg total) by mouth daily at 6 PM.     benztropine 1 MG tablet  Commonly known as:  COGENTIN  Take 1 mg by mouth at bedtime.     clonazePAM 0.5 MG tablet  Commonly known as:  KLONOPIN  Take 0.5 mg by mouth at bedtime.     cloZAPine 100 MG tablet  Commonly known as:  CLOZARIL  Take 150-500 mg by mouth 2 (two) times daily. Take  by mouth in AM and  by mouth in PM     docusate sodium 100 MG capsule  Commonly known as:  COLACE  Take 100 mg by mouth daily.     DULoxetine 60 MG capsule  Commonly known as:  CYMBALTA  Take 60 mg by mouth daily.     gemfibrozil 600 MG tablet  Commonly known as:  LOPID  Take 600 mg by mouth 2 (two) times daily before a meal.     insulin aspart 100 UNIT/ML injection  Commonly known as:  novoLOG  Inject 10 Units into the skin 3 (three) times daily before meals.     insulin detemir 100 UNIT/ML injection  Commonly known as:  LEVEMIR  30 units of Levemir At lunchtime x 1 week, then increase back to 35 units daily     latanoprost 0.005 % ophthalmic solution  Commonly known as:  XALATAN  Place 1 drop into  both eyes at bedtime.     metoprolol 50 MG tablet  Commonly known as:  LOPRESSOR  Take 1 tablet (50 mg total) by mouth 2 (two) times daily.     metroNIDAZOLE 500 MG tablet  Commonly known as:  FLAGYL  Take 1 tablet (500 mg total) by mouth every 8 (eight) hours.     nitroGLYCERIN 0.4 MG SL tablet  Commonly known as:  NITROSTAT  Place 1 tablet (0.4 mg total) under the tongue every 5 (five) minutes x 3 doses as needed for chest pain.     omeprazole  40 MG capsule  Commonly known as:  PRILOSEC  Take 40 mg by mouth 2 (two) times daily.     saccharomyces boulardii 250 MG capsule  Commonly known as:  FLORASTOR  Take 1 capsule (250 mg total) by mouth 2 (two) times daily.     tamsulosin 0.4 MG Caps capsule  Commonly known as:  FLOMAX  Take 0.4 mg by mouth daily.     ticagrelor 90 MG Tabs tablet  Commonly known as:  BRILINTA  Take 1 tablet (90 mg total) by mouth 2 (two) times daily.       Allergies  Allergen Reactions  . Penicillins Other (See Comments)    Reaction unknown      The results of significant diagnostics from this hospitalization (including imaging, microbiology, ancillary and laboratory) are listed below for reference.    Significant Diagnostic Studies: Ct Abdomen Pelvis Wo Contrast  02/23/2014   CLINICAL DATA:  Nausea/vomiting, abdominal pain, diarrhea.  EXAM: CT ABDOMEN AND PELVIS WITHOUT CONTRAST  TECHNIQUE: Multidetector CT imaging of the abdomen and pelvis was performed following the standard protocol without IV contrast.  COMPARISON:  06/21/2011  FINDINGS: Lower chest:  Mild scarring at the lung bases.  Hepatobiliary: Liver is within normal limits.  Layering gallstones (series 2/image 28). No intrahepatic or extrahepatic ductal dilatation.  Pancreas: Within normal limits.  Spleen: Within normal limits.  Adrenals/Urinary Tract: Stable low-density thickening of the bilateral adrenal glands, likely reflecting benign adrenal hyperplasia.  7 mm nonobstructing left  lower pole renal calculus (series 2/ image 40). Right kidney is within normal limits. No hydronephrosis.  No ureteral or bladder calculi.  Bladder is within normal limits.  Stomach/Bowel: Stomach is unremarkable.  Multiple dilated loops of proximal/mid small bowel, without discrete focal transition. Distal/terminal ileum is decompressed (series 2/image 24). This appearance favors adynamic small bowel ileus over small bowel obstruction given the associated colonic findings (below).  Wall thickening involving in the ascending and proximal transverse colon (series 2/ image 37), suspicious for infectious/inflammatory colitis.  No drainable fluid collection/abscess.  No free air.  Vascular/Lymphatic: Fusiform ectasia of the infrarenal abdominal aorta measuring 2.9 x 2.5 cm (series 2/ image 47). 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  No suspicious abdominopelvic lymphadenopathy.  Reproductive: Prostate is notable for dystrophic calcifications.  Other: No abdominopelvic ascites.  Musculoskeletal: Degenerative changes of the visualized thoracolumbar spine.  IMPRESSION: Wall thickening involving the ascending and proximal transverse colon, suspicious for infectious/inflammatory colitis.  Multiple loops of proximal/mid small bowel, favored to reflect adynamic small bowel ileus, less likely partial small bowel obstruction.  Cholelithiasis, without associated inflammatory changes.  7 mm nonobstructing left lower pole renal calculus. No ureteral or bladder calculi. No hydronephrosis.   Electronically Signed   By: Charline Bills M.D.   On: 02/23/2014 02:54   Dg Chest 2 View  02/22/2014   CLINICAL DATA:  Dizziness and lethargy  EXAM: CHEST  2 VIEW  COMPARISON:  November 20, 2013  FINDINGS: There is no edema or consolidation. The heart size is upper normal with pulmonary vascularity within normal limits. No adenopathy. No bone lesions.  IMPRESSION: No edema or consolidation.   Electronically Signed    By: Bretta Bang M.D.   On: 02/22/2014 18:46    Microbiology: Recent Results (from the past 240 hour(s))  Stool culture     Status: None (Preliminary result)   Collection Time: 02/23/14  8:20 AM  Result Value Ref Range Status   Specimen Description STOOL  Final   Special Requests NONE  Final   Culture   Final    Culture reincubated for better growth Performed at [facility]    Report Status PENDING  Incomplete  Clostridium Difficile by PCR     Status: Abnormal   Collection Time: 02/23/14  8:20 AM  Result Value Ref Range Status   C difficile by pcr POSITIVE (A) NEGATIVE Final    Comment: CRITICAL RESULT CALLED TO, READ BACK BY AND VERIFIED WITH: COFFEY,J. AT 1253 ON 02/23/2014 BY BAUGHAM,M.      Labs: Basic Metabolic Panel:  Recent Labs Lab 02/22/14 1641 02/22/14 1938 02/23/14 0706 02/24/14 0605 02/24/14 0611 02/25/14 0551 02/26/14 0629  NA 141  --  137  --  139 141 143  K 2.9*  --  3.2*  --  2.8* 3.9 4.0  CL 108  --  107  --  109 115* 114*  CO2 22  --  21  --  22 20 25   GLUCOSE 38*  --  121*  --  130* 119* 121*  BUN 32*  --  27*  --  16 10 8   CREATININE 1.66*  --  1.38*  --  1.15 0.90 1.00  CALCIUM 8.5  --  8.2*  --  7.8* 7.9* 8.3*  MG  --  2.1  --  2.2  --  2.2  --    Liver Function Tests:  Recent Labs Lab 02/23/14 0706 02/24/14 0611 02/25/14 0551 02/26/14 0629  AST 26 21 20 21   ALT 25 22 21 22   ALKPHOS 63 55 56 63  BILITOT 0.8 0.6 0.5 0.4  PROT 6.7 5.5* 5.3* 5.4*  ALBUMIN 3.3* 2.8* 2.7* 2.8*    Recent Labs Lab 02/22/14 1641  LIPASE 12   No results for input(s): AMMONIA in the last 168 hours. CBC:  Recent Labs Lab 02/22/14 1641 02/23/14 0706 02/24/14 0611 02/25/14 0551 02/26/14 0629  WBC 6.0 7.2 7.0 8.8 9.6  NEUTROABS  --  5.4 5.2 6.2 6.2  HGB 16.2 15.2 13.6 12.8* 12.6*  HCT 46.1 44.2 40.1 37.9* 37.7*  MCV 86.5 87.0 87.4 86.9 87.1  PLT 123* 108* 102* 113* 131*   Cardiac Enzymes:  Recent Labs Lab 02/22/14 1641  02/22/14 2147 02/23/14 0054 02/23/14 0706 02/23/14 1242  TROPONINI 0.27* 0.24* 0.25* 0.24* 0.21*   BNP: BNP (last 3 results) No results for input(s): PROBNP in the last 8760 hours. CBG:  Recent Labs Lab 02/25/14 2051 02/26/14 0128 02/26/14 0615 02/26/14 0738 02/26/14 1134  GLUCAP 192* 92 109* 107* 141*       Signed:  KRISHNAN,SENDIL K  [facility] 02/26/2014, 1:16 PM

## 2014-02-26 NOTE — Clinical Social Work Note (Signed)
Pt d/c today back to [facility]. Pt and facility aware and agreeable. Pt does not wish for family to be notified. Facility to provide transportation.  Derenda Fennel, [place] [phone]

## 2014-02-28 LAB — STOOL CULTURE

## 2014-03-01 NOTE — Progress Notes (Signed)
UR chart review completed.  

## 2014-04-01 ENCOUNTER — Encounter: Payer: Self-pay | Admitting: Gastroenterology

## 2014-04-21 ENCOUNTER — Ambulatory Visit (INDEPENDENT_AMBULATORY_CARE_PROVIDER_SITE_OTHER): Payer: Medicare Other | Admitting: Nurse Practitioner

## 2014-04-21 ENCOUNTER — Encounter: Payer: Self-pay | Admitting: Nurse Practitioner

## 2014-04-21 VITALS — BP 125/80 | HR 94 | Temp 97.0°F | Ht 62.0 in | Wt 150.2 lb

## 2014-04-21 DIAGNOSIS — K8020 Calculus of gallbladder without cholecystitis without obstruction: Secondary | ICD-10-CM

## 2014-04-21 DIAGNOSIS — Z1211 Encounter for screening for malignant neoplasm of colon: Secondary | ICD-10-CM

## 2014-04-21 DIAGNOSIS — K649 Unspecified hemorrhoids: Secondary | ICD-10-CM | POA: Insufficient documentation

## 2014-04-21 MED ORDER — HYDROCORTISONE 2.5 % RE CREA: 1.0000 "application " | TOPICAL_CREAM | Freq: Two times a day (BID) | RECTAL | Status: DC

## 2014-04-21 NOTE — Assessment & Plan Note (Addendum)
54 year old AA male never had colonoscopy before. On Brilenta for PCI in 10/2013, low risk procedure, no indications to hold. Will get cardiology clearance to move forward with procedure given recent PCI. Intermittent low volume tissue hematochezia with known history of hemorrhoids, no overt GI bleed or melena. On multiple meds including Klonopin, Cymbalta, cogentin and history of Schizophrenia and recommend OR procedure with propofol.  Proceed with colonoscopy with Dr. Darrick PennaFields in the OR with propofol (pending cardiology clearance) in the near future. The risks, benefits, and alternatives have been discussed in detail with the patient. They state understanding and desire to proceed.

## 2014-04-21 NOTE — Assessment & Plan Note (Signed)
Intermittent irritation and low volume toilet tissue hematochezia. Will rx Anusol rectal cream for symptoms, bid prn no more than 7 days at a time. Due for colonoscopy which we will setup pending cardiology clearance (see screening colonoscopy A&P for details)

## 2014-04-21 NOTE — Patient Instructions (Signed)
1. We will request the heart doctor's ok to do the colonoscopy because of your recent heart attack 2. If the cardiologist says it's ok, we will schedule the colonoscopy for you 3. I am giving you a prescription for rectal cream (Anusol) to use for hemorrhoid symptoms, as you need to. Do not take it for more than 7 days at a time. 4. If you have symptoms of gallbladder issues, either we or your primary care doctor can refer you to a surgeon for evaluation.

## 2014-04-21 NOTE — Assessment & Plan Note (Signed)
Incidental CT finding during hospitalization for C. Diff colitis. Cholelithiasis without obstruction or associated inflammation, asymptomatic. With recent C. Diff and STEMI/PCI, and asymptomatic presentation will hold off on surgical referral at this time (likely need for postop abx with increased risk of C. Diff). If becomes symptomatic can refer to general surgery for evaluation either by us or PCP.

## 2014-04-21 NOTE — Progress Notes (Signed)
Primary Care Physician:  Anselm Jungling, NP Primary Gastroenterologist:  Dr. Darrick Penna  Chief Complaint  Patient presents with  . Colonoscopy    HPI:   54 year old male presents on referral for evaluation for colonoscopy and treatment of gallstones. Was recently hospitalized with abdominal pain, N/V and dx of C. Diff which he was treated for and discharged. CT during admission showed asymptomatic cholelithiasis without associated inflammation. Has never had a colonoscopy. He is accompanied by staff from the treatment home. Mental processing issues with history of Schizophrenia.  Today states he's been doing ok. Occasional abdominal pain. Per staff, notes indicate rare tissue paper low volume hematochezia with known history of hemorrhoids. Patient states he has never had a colonoscopy before. Occasional irritation from hemorrhoids. Denies melena. Denies N/V. Denies any other upper or lower GI symptoms.   Past Medical History  Diagnosis Date  . Type 2 diabetes mellitus   . Schizophrenia   . Essential hypertension   . CAD (coronary artery disease)     a. 10/2013 Lateral STEMI/PCI: LM nl, LAD min irregs, D1 occluded sub branch, LCX min irregs mid, OM large/patent, RCA 128m (2.75x28 Promus DES).  . Hyperlipidemia   . Peripheral vascular disease     a. 06/2007: s/p L AKA @ [facility]  . History of renal failure     a. 06/2007: in setting of rhabdo post L AKA.  . MI (myocardial infarction)     Past Surgical History  Procedure Laterality Date  . Above-the-knee amputation Left   . Left heart catheterization with coronary angiogram N/A 11/20/2013    Procedure: LEFT HEART CATHETERIZATION WITH CORONARY ANGIOGRAM;  Surgeon: Corky Crafts, MD;  Location: [facility] CATH LAB;  Service: Cardiovascular;  Laterality: N/A;    Current Outpatient Prescriptions  Medication Sig Dispense Refill  . amLODipine (NORVASC) 5 MG tablet Take 5 mg by mouth daily.    . ARIPiprazole (ABILIFY) 5 MG tablet Take 5 mg by  mouth daily.    Marland Kitchen aspirin EC 81 MG tablet Take 81 mg by mouth daily.    Marland Kitchen atorvastatin (LIPITOR) 80 MG tablet Take 1 tablet (80 mg total) by mouth daily at 6 PM. 30 tablet 11  . benztropine (COGENTIN) 1 MG tablet Take 1 mg by mouth at bedtime.    . clonazePAM (KLONOPIN) 0.5 MG tablet Take 0.5 mg by mouth at bedtime.    . cloZAPine (CLOZARIL) 100 MG tablet Take 150-500 mg by mouth 2 (two) times daily. Take  by mouth in AM and  by mouth in PM    . docusate sodium (COLACE) 100 MG capsule Take 100 mg by mouth daily.    . DULoxetine (CYMBALTA) 60 MG capsule Take 60 mg by mouth daily.    Marland Kitchen gemfibrozil (LOPID) 600 MG tablet Take 600 mg by mouth 2 (two) times daily before a meal.    . insulin aspart (NOVOLOG) 100 UNIT/ML injection Inject 10 Units into the skin 3 (three) times daily before meals.    . insulin detemir (LEVEMIR) 100 UNIT/ML injection 30 units of Levemir At lunchtime x 1 week, then increase back to 35 units daily 10 mL 11  . latanoprost (XALATAN) 0.005 % ophthalmic solution Place 1 drop into both eyes at bedtime.    . metoprolol (LOPRESSOR) 50 MG tablet Take 1 tablet (50 mg total) by mouth 2 (two) times daily. 180 tablet 3  . nitroGLYCERIN (NITROSTAT) 0.4 MG SL tablet Place 1 tablet (0.4 mg total) under the tongue every  5 (five) minutes x 3 doses as needed for chest pain. 25 tablet 12  . omeprazole (PRILOSEC) 40 MG capsule Take 40 mg by mouth 2 (two) times daily.     Marland Kitchen. saccharomyces boulardii (FLORASTOR) 250 MG capsule Take 1 capsule (250 mg total) by mouth 2 (two) times daily. 28 capsule 0  . tamsulosin (FLOMAX) 0.4 MG CAPS capsule Take 0.4 mg by mouth daily.     . ticagrelor (BRILINTA) 90 MG TABS tablet Take 1 tablet (90 mg total) by mouth 2 (two) times daily. 60 tablet 11  . traZODone (DESYREL) 50 MG tablet Take 50 mg by mouth at bedtime.     No current facility-administered medications for this visit.    Allergies as of 04/21/2014 - Review Complete 04/21/2014  Allergen  Reaction Noted  . Penicillins Other (See Comments) 11/20/2013    Family History  Problem Relation Age of Onset  . Other      no premature CAD.    History   Social History  . Marital Status: Single    Spouse Name: N/A  . Number of Children: N/A  . Years of Education: N/A   Occupational History  . Not on file.   Social History Main Topics  . Smoking status: Former Smoker -- 1.00 packs/day for 20 years    Types: Cigars, Cigarettes    Quit date: 12/13/2013  . Smokeless tobacco: Never Used  . Alcohol Use: No  . Drug Use: No  . Sexual Activity: No   Other Topics Concern  . Not on file   Social History Narrative    Review of Systems: Aided by subjective reports from facility staff based on patient reports to staff logged into record. Gen: Denies any fever, chills, weight loss, lack of appetite.  CV: Denies chest pain, heart palpitations, syncope.  Resp: Denies shortness of breath at rest or with exertion. Denies wheezing or cough.  GI: See HPI. Denies dysphagia or odynophagia. Denies jaundice, hematemesis, fecal incontinence. MS: Denies joint pain, muscle weakness, cramps, or limitation of movement.  Derm: Denies rash, itching, dry skin Heme: Denies bruising, bleeding, and enlarged lymph nodes.  Physical Exam: BP 125/80 mmHg  Pulse 94  Temp(Src) 97 F (36.1 C) (Oral)  Ht 5\' 2"  (1.575 m)  Wt 150 lb 3.2 oz (68.13 kg)  BMI 27.46 kg/m2 General:   Alert and oriented. Pleasant and cooperative. Well-nourished and well-developed. Some cognative impairment noted in conversation. Head:  Normocephalic and atraumatic. Eyes:  Without icterus, sclera clear and conjunctiva pink.  Ears:  Normal auditory acuity. Nose:  No deformity, discharge,  or lesions. Mouth:  No deformity or lesions, oral mucosa pink.  Neck:  Supple, without mass or thyromegaly. Lungs:  Clear to auscultation bilaterally. No wheezes, rales, or rhonchi. No distress.  Heart:  S1, S2 present without murmurs  appreciated.  Abdomen:  +BS, soft, and non-distended. Minimal generalized discomfort to palpation. No HSM noted. No guarding or rebound. No masses appreciated.  Rectal:  Deferred  Msk:  Symmetrical without gross deformities. Normal posture. Pulses:  Normal pulses noted. Extremities:  Without clubbing or edema. Neurologic:  Alert and  oriented x4;  grossly normal neurologically. Skin:  Intact without significant lesions or rashes. Cervical Nodes:  No significant cervical adenopathy. Psych:  Alert and cooperative.     04/21/2014 11:16 AM

## 2014-04-22 ENCOUNTER — Telehealth: Payer: Self-pay

## 2014-04-22 NOTE — Telephone Encounter (Signed)
-----   Message from Jonelle SidleSamuel G McDowell, MD sent at 04/22/2014  4:37 PM EST ----- I have not seen this patient recently. He is status post drug-eluting stent placement in September 2015 based on review of the last note. It is too early to electively stop dual antiplatelet therapy for a screening colonoscopy. We can certainly see him back, however recommendation would be to continue his current medications and defer a screening colonoscopy at least until September of this year.    ----- Message -----    From: Evalee MuttonJulie Hardin Elsi Stelzer, LPN    Sent: 1/61/09602/25/2016   9:21 AM      To: Jonelle SidleSamuel G McDowell, MD  Hello Dr.McDowell, this patient was seen in our office to schedule a screening colonoscopy, however given his recent PCI, Wynne DustEric Gill NP would like to have cardiac clearance prior to his procedure. His procedure will be done in the OR.   Thank you,  Hendricks LimesJulie Saory Carriero LPN [facility]

## 2014-04-22 NOTE — Telephone Encounter (Signed)
Routing to [employer] [facility] and Dr.Fields.

## 2014-04-23 ENCOUNTER — Ambulatory Visit: Payer: Medicare Other | Admitting: Cardiology

## 2014-04-23 NOTE — Telephone Encounter (Signed)
Noted  

## 2014-04-27 NOTE — Progress Notes (Signed)
CC'ED TO PCP 

## 2014-04-28 NOTE — Telephone Encounter (Addendum)
REVIEWED. AGREE. DISCUSS WITH PT BENEFITS OF DOING ROUTINE TCS DO NOT OUTWEIGH RISKS. OPV IN SEP 2016 TO DISCUSS AGAIN.

## 2014-04-28 NOTE — Telephone Encounter (Signed)
Please notify patient and ensure an appropriate OV is scheduled.

## 2014-04-29 ENCOUNTER — Ambulatory Visit: Payer: Medicare Other | Admitting: Cardiology

## 2014-04-29 NOTE — Telephone Encounter (Signed)
LMOM for a return call.  

## 2014-04-30 NOTE — Telephone Encounter (Signed)
LMOM to call. Mailing a letter to call also.  

## 2014-06-02 ENCOUNTER — Ambulatory Visit (INDEPENDENT_AMBULATORY_CARE_PROVIDER_SITE_OTHER): Payer: Medicare Other | Admitting: Cardiology

## 2014-06-02 ENCOUNTER — Encounter: Payer: Self-pay | Admitting: Cardiology

## 2014-06-02 VITALS — BP 110/66 | HR 90

## 2014-06-02 DIAGNOSIS — I2510 Atherosclerotic heart disease of native coronary artery without angina pectoris: Secondary | ICD-10-CM | POA: Diagnosis not present

## 2014-06-02 DIAGNOSIS — E785 Hyperlipidemia, unspecified: Secondary | ICD-10-CM

## 2014-06-02 DIAGNOSIS — I10 Essential (primary) hypertension: Secondary | ICD-10-CM | POA: Diagnosis not present

## 2014-06-02 NOTE — Progress Notes (Signed)
Cardiology Office Note  Date: 06/02/2014   ID: Oscar Elliott, DOB November 22, 1960, MRN [id number]  PCP: Anselm Jungling, NP  Primary Cardiologist: Nona Dell, MD   Chief Complaint  Patient presents with  . Coronary Artery Disease  . Hypertension  . Hyperlipidemia    History of Present Illness: Oscar Elliott is a 54 y.o. male last seen in November 2015. He presents with his care home owner for a routine visit. He does not endorse any chest pain or major change in functional capacity. He uses a walker to ambulate, denies any falls. States that he has had a reasonable appetite. Medication compliance is reported.  Cardiac history is reviewed below. I explained that we would anticipate continuing aspirin and Brilinta through September. He does not report any bleeding problems.  He is due for follow-up lipid panel and LFTs, has been on high-dose Lipitor.   Past Medical History  Diagnosis Date  . Type 2 diabetes mellitus   . Schizophrenia   . Essential hypertension   . CAD (coronary artery disease)     a. 10/2013 Lateral STEMI/PCI: LM nl, LAD min irregs, D1 occluded sub branch, LCX min irregs mid, OM large/patent, RCA 170m (2.75x28 Promus DES).  . Hyperlipidemia   . Peripheral vascular disease     a. 06/2007: s/p L AKA @ [facility]  . History of renal failure     a. 06/2007: in setting of rhabdo post L AKA.  . MI (myocardial infarction)      Current Outpatient Prescriptions  Medication Sig Dispense Refill  . amLODipine (NORVASC) 5 MG tablet Take 5 mg by mouth daily.    . ARIPiprazole (ABILIFY) 5 MG tablet Take 5 mg by mouth daily.    Marland Kitchen aspirin EC 81 MG tablet Take 81 mg by mouth daily.    Marland Kitchen atorvastatin (LIPITOR) 80 MG tablet Take 1 tablet (80 mg total) by mouth daily at 6 PM. 30 tablet 11  . benztropine (COGENTIN) 1 MG tablet Take 1 mg by mouth at bedtime.    . clonazePAM (KLONOPIN) 0.5 MG tablet Take 0.5 mg by mouth at bedtime.    . cloZAPine (CLOZARIL) 100 MG tablet Take  150-500 mg by mouth 2 (two) times daily. Take  by mouth in AM and  by mouth in PM    . docusate sodium (COLACE) 100 MG capsule Take 100 mg by mouth daily.    . DULoxetine (CYMBALTA) 60 MG capsule Take 60 mg by mouth daily.    Marland Kitchen gemfibrozil (LOPID) 600 MG tablet Take 600 mg by mouth 2 (two) times daily before a meal.    . hydrocortisone (ANUSOL-HC) 2.5 % rectal cream Place 1 application rectally 2 (two) times daily. 30 g 1  . insulin aspart (NOVOLOG) 100 UNIT/ML injection Inject 10 Units into the skin 3 (three) times daily before meals.    . insulin detemir (LEVEMIR) 100 UNIT/ML injection 30 units of Levemir At lunchtime x 1 week, then increase back to 35 units daily 10 mL 11  . latanoprost (XALATAN) 0.005 % ophthalmic solution Place 1 drop into both eyes at bedtime.    . metoprolol (LOPRESSOR) 50 MG tablet Take 1 tablet (50 mg total) by mouth 2 (two) times daily. 180 tablet 3  . nitroGLYCERIN (NITROSTAT) 0.4 MG SL tablet Place 1 tablet (0.4 mg total) under the tongue every 5 (five) minutes x 3 doses as needed for chest pain. 25 tablet 12  . omeprazole (PRILOSEC) 40 MG capsule Take 40 mg by mouth  2 (two) times daily.     Marland Kitchen. saccharomyces boulardii (FLORASTOR) 250 MG capsule Take 1 capsule (250 mg total) by mouth 2 (two) times daily. 28 capsule 0  . tamsulosin (FLOMAX) 0.4 MG CAPS capsule Take 0.4 mg by mouth daily.     . ticagrelor (BRILINTA) 90 MG TABS tablet Take 1 tablet (90 mg total) by mouth 2 (two) times daily. 60 tablet 11  . traZODone (DESYREL) 50 MG tablet Take 50 mg by mouth at bedtime.     No current facility-administered medications for this visit.    Allergies:  Penicillins   Social History: The patient  reports that he has been smoking Cigars and Cigarettes.  He has a 5 pack-year smoking history. He has never used smokeless tobacco. He reports that he does not drink alcohol or use illicit drugs.   ROS:  Please see the history of present illness. Otherwise, complete review  of systems is positive for none.  All other systems are reviewed and negative.   Physical Exam: VS:  BP 110/66 mmHg  Pulse 90  SpO2 96%, BMI There is no weight on file to calculate BMI.  Wt Readings from Last 3 Encounters:  04/21/14 150 lb 3.2 oz (68.13 kg)  02/23/14 128 lb 11.2 oz (58.378 kg)  01/13/14 150 lb (68.04 kg)     Patient appears comfortable at rest. HEENT: Conjunctiva and lids normal, oropharynx clear. Neck: Supple, no elevated JVP or carotid bruits, no thyromegaly. Lungs: Clear to auscultation, nonlabored breathing at rest. Cardiac: Regular rate and rhythm, no S3 or significant systolic murmur, no pericardial rub. Abdomen: Soft, nontender, bowel sounds present. Extremities: No pitting edema, distal pulses 2+.   ECG: ECG is not ordered today.  Recent Labwork: 11/21/2013: TSH 1.490 02/25/2014: Magnesium 2.2 02/26/2014: ALT 22; AST 21; BUN 8; Creatinine 1.00; Hemoglobin 12.6*; Platelets 131*; Potassium 4.0; Sodium 143     Component Value Date/Time   CHOL 102 11/21/2013 0123   TRIG 66 11/21/2013 0123   HDL 27* 11/21/2013 0123   CHOLHDL 3.8 11/21/2013 0123   VLDL 13 11/21/2013 0123   LDLCALC 62 11/21/2013 0123    Other Studies Reviewed Today:  Echocardiogram 11/21/2013: Study Conclusions  - Left ventricle: Hypokinesis of the basal inferior, inferolateral and inferoseptal walls. The cavity size was normal. There was moderate concentric hypertrophy. Systolic function was normal. The estimated ejection fraction was in the range of 50% to 55%. Wall motion was normal; there were no regional wall motion abnormalities. Left ventricular diastolic function parameters were normal. - Aortic valve: Trileaflet; normal thickness leaflets. There was no regurgitation. - Left atrium: The atrium was normal in size. - Right ventricle: The cavity size was normal. Wall thickness was moderately increased. Systolic function was mildly reduced. - Tricuspid valve:  There was mild regurgitation. - Pulmonic valve: There was no regurgitation. - Pulmonary arteries: Systolic pressure was within the normal range. - Pericardium, extracardiac: A mild circumferential pericardial effusion was identified. Features were not consistent with tamponade physiology.  Impressions:  - Low normal LV function with hypokinesis in the basal inferoseptal, inferior and inferolateral leads. Mildly reduced right ventricular systolic function.   ASSESSMENT AND PLAN:  1. Symptomatically stable with history of CAD status post lateral STEMI in September of last year managed with DES to the RCA. Plan is to continue DAPT through September of this year. No other changes made to current regimen.  2. Essential hypertension, blood pressure is normal today.  3. Mixed hyperlipidemia, remains on high-dose Lipitor. Follow-up  FLP and LFT. He will have this checked through his care home.  Current medicines were reviewed at length with the patient today.   Disposition: FU with me in September.   Signed, Jonelle Sidle, MD, [facility] 06/02/2014 4:24 PM    [facility] at [facility] [address], [place], [place] [address] Phone: [phone]; Fax: [phone]

## 2014-06-02 NOTE — Patient Instructions (Signed)
Your physician recommends that you schedule a follow-up appointment in Sept. With Dr. Diona BrownerMcDowell.  Your physician recommends that you continue on your current medications as directed. Please refer to the Current Medication list given to you today.  Your physician recommends that you return for lab work in: Liver and Lipid Please fax results to Dr. Diona BrownerMcDowell   Thank you for choosing [facility]!

## 2014-10-12 ENCOUNTER — Other Ambulatory Visit: Payer: Self-pay | Admitting: Cardiovascular Disease

## 2014-10-25 ENCOUNTER — Ambulatory Visit (INDEPENDENT_AMBULATORY_CARE_PROVIDER_SITE_OTHER): Payer: Medicare Other | Admitting: Adult Health

## 2014-10-25 ENCOUNTER — Encounter: Payer: Self-pay | Admitting: Adult Health

## 2014-10-25 VITALS — BP 122/82 | HR 91 | Ht 62.0 in | Wt 146.0 lb

## 2014-10-25 DIAGNOSIS — F17210 Nicotine dependence, cigarettes, uncomplicated: Secondary | ICD-10-CM | POA: Diagnosis not present

## 2014-10-25 DIAGNOSIS — I2510 Atherosclerotic heart disease of native coronary artery without angina pectoris: Secondary | ICD-10-CM | POA: Diagnosis not present

## 2014-10-25 DIAGNOSIS — I10 Essential (primary) hypertension: Secondary | ICD-10-CM

## 2014-10-25 DIAGNOSIS — Z720 Tobacco use: Secondary | ICD-10-CM | POA: Diagnosis not present

## 2014-10-25 NOTE — Progress Notes (Signed)
Cardiology Office Note   Date:  10/25/2014   ID:  Oscar Elliott, DOB June 18, 1960, MRN [id number]  PCP:  Oscar Jungling, NP  Cardiologist:  McDowell/ Joni Reining, NP   No chief complaint on file.     History of Present Illness: Oscar Elliott is a 54 y.o. male who presents for ongoing assessment and management of hypertension, CAD with hx of lateral STEMI, DES to the RCA, with hx of hyperlipidemia. DM, PVD with L AKA in 2009. He was last seen by Dr. Diona Elliott in April of 2016. He was symptomatically stable, with no changes in his medical management.    He comes today with caregiver. He has complaints of constipation but no chest pain or other cardiac issues. He is compliant with his medications. He unfortunately continues to smoke.  Past Medical History  Diagnosis Date  . Type 2 diabetes mellitus   . Schizophrenia   . Essential hypertension   . CAD (coronary artery disease)     a. 10/2013 Lateral STEMI/PCI: LM nl, LAD min irregs, D1 occluded sub branch, LCX min irregs mid, OM large/patent, RCA 161m (2.75x28 Promus DES).  . Hyperlipidemia   . Peripheral vascular disease     a. 06/2007: s/p L AKA @ [facility]  . History of renal failure     a. 06/2007: in setting of rhabdo post L AKA.  . MI (myocardial infarction)     Past Surgical History  Procedure Laterality Date  . Above-the-knee amputation Left   . Left heart catheterization with coronary angiogram N/A 11/20/2013    Procedure: LEFT HEART CATHETERIZATION WITH CORONARY ANGIOGRAM;  Surgeon: Corky Crafts, MD;  Location: [facility] CATH LAB;  Service: Cardiovascular;  Laterality: N/A;     Current Outpatient Prescriptions  Medication Sig Dispense Refill  . amLODipine (NORVASC) 5 MG tablet Take 5 mg by mouth daily.    . ARIPiprazole (ABILIFY) 5 MG tablet Take 5 mg by mouth daily.    Marland Kitchen aspirin EC 81 MG tablet Take 81 mg by mouth daily.    Marland Kitchen atorvastatin (LIPITOR) 80 MG tablet Take 1 tablet (80 mg total) by mouth daily at 6 PM. 30  tablet 11  . benztropine (COGENTIN) 1 MG tablet Take 1 mg by mouth at bedtime.    . clonazePAM (KLONOPIN) 0.5 MG tablet Take 0.5 mg by mouth at bedtime.    . cloZAPine (CLOZARIL) 100 MG tablet Take 150-500 mg by mouth 2 (two) times daily. Take  by mouth in AM and  by mouth in PM    . docusate sodium (COLACE) 100 MG capsule Take 100 mg by mouth daily.    . DULoxetine (CYMBALTA) 60 MG capsule Take 60 mg by mouth daily.    Marland Kitchen gemfibrozil (LOPID) 600 MG tablet Take 600 mg by mouth 2 (two) times daily before a meal.    . hydrocortisone (ANUSOL-HC) 2.5 % rectal cream Place 1 application rectally 2 (two) times daily. 30 g 1  . insulin aspart (NOVOLOG) 100 UNIT/ML injection Inject 10 Units into the skin 3 (three) times daily before meals.    . insulin detemir (LEVEMIR) 100 UNIT/ML injection 30 units of Levemir At lunchtime x 1 week, then increase back to 35 units daily 10 mL 11  . latanoprost (XALATAN) 0.005 % ophthalmic solution Place 1 drop into both eyes at bedtime.    . metoprolol (LOPRESSOR) 50 MG tablet Take 1 tablet (50 mg total) by mouth 2 (two) times daily. 180 tablet 3  . nitroGLYCERIN (NITROSTAT) 0.4 MG  SL tablet Place 1 tablet (0.4 mg total) under the tongue every 5 (five) minutes x 3 doses as needed for chest pain. 25 tablet 12  . omeprazole (PRILOSEC) 40 MG capsule Take 40 mg by mouth 2 (two) times daily.     Marland Kitchen saccharomyces boulardii (FLORASTOR) 250 MG capsule Take 1 capsule (250 mg total) by mouth 2 (two) times daily. 28 capsule 0  . tamsulosin (FLOMAX) 0.4 MG CAPS capsule Take 0.4 mg by mouth daily.     . ticagrelor (BRILINTA) 90 MG TABS tablet Take 1 tablet (90 mg total) by mouth 2 (two) times daily. 60 tablet 11  . traZODone (DESYREL) 50 MG tablet Take 50 mg by mouth at bedtime.     No current facility-administered medications for this visit.    Allergies:   Penicillins    Social History:  The patient  reports that he has been smoking Cigars and Cigarettes.  He has a 5  pack-year smoking history. He has never used smokeless tobacco. He reports that he does not drink alcohol or use illicit drugs.   Family History:  The patient's family history includes Other in an other family member.    ROS: .   All other systems are reviewed and negative.Unless otherwise mentioned in H&P above.   PHYSICAL EXAM: VS:  BP 122/82 mmHg  Pulse 91  Ht 5\' 2"  (1.575 m)  Wt 146 lb (66.225 kg)  BMI 26.70 kg/m2  SpO2 98% , BMI Body mass index is 26.7 kg/(m^2). GEN: Well nourished, well developed, in no acute distress HEENT: normalHead is smaller than normal  Neck: no JVD, carotid bruits, or masses Cardiac: RRR; no murmurs, rubs, or gallops,no edema  Respiratory:Some crackles in the bases. GI: soft, nontender, nondistended, + BS MS: no deformity or atrophyProsthetic left leg.  Skin: warm and dry, no rash Neuro:  Strength and sensation are intact Psych: euthymic mood, full affect    Recent Labs: 11/21/2013: TSH 1.490 02/25/2014: Magnesium 2.2 02/26/2014: ALT 22; BUN 8; Creatinine, Ser 1.00; Hemoglobin 12.6*; Platelets 131*; Potassium 4.0; Sodium 143    Lipid Panel    Component Value Date/Time   CHOL 102 11/21/2013 0123   TRIG 66 11/21/2013 0123   HDL 27* 11/21/2013 0123   CHOLHDL 3.8 11/21/2013 0123   VLDL 13 11/21/2013 0123   LDLCALC 62 11/21/2013 0123      Wt Readings from Last 3 Encounters:  10/25/14 146 lb (66.225 kg)  04/21/14 150 lb 3.2 oz (68.13 kg)  02/23/14 128 lb 11.2 oz (58.378 kg)      Other studies Reviewed: Additional studies/ records that were reviewed today include: None Review of the above records demonstrates: N/A   ASSESSMENT AND PLAN:  1.  CAD: No cardiac complaints. Will continue DAPT, BB, statin. He will be seen in a year unless symptomatic. His caregiver states that he is a hypochondriac, and with lowered mental level, he is hard to read. I have advised that he come back for severe pain in his chest with associated dyspnea and  diaphoresis.   2. Hypertension: Currently well controlled. No changes in medications,  3. Complaints of abdominal pain; He is due for colonoscopy. He is ok to proceed from cardiac standpoint.   4. Ongoing tobacco abuse: He does not buy his own cigarettes. He borrows them from others. I have asked him to stop smoking and discussed cessation for 5-1- minutes. He verbalizes understanding.     Current medicines are reviewed at length with the patient today.  Labs/ tests ordered today include: None No orders of the defined types were placed in this encounter.     Disposition:   FU with 1 year Signed, Joni Reining, NP  10/25/2014 2:07 PM    [facility] [address], [place], [place] [address] Phone: [phone]; Fax: [phone]

## 2014-10-25 NOTE — Patient Instructions (Signed)
Your physician wants you to follow-up in: 1 year with Kathryn Lawrence, NP. You will receive a reminder letter in the mail two months in advance. If you don't receive a letter, please call our office to schedule the follow-up appointment.  Your physician recommends that you continue on your current medications as directed. Please refer to the Current Medication list given to you today.  Thank you for choosing [facility]!   

## 2014-10-25 NOTE — Progress Notes (Deleted)
Name: Oscar Elliott    DOB: 06-20-1960  Age: 54 y.o.  MR#: [id number]       PCP:  Anselm Jungling, NP      Insurance: Payor: MEDICARE / Plan: MEDICARE PART A AND B / Product Type: *No Product type* /   CC:   No chief complaint on file.   VS Filed Vitals:   10/25/14 1354  BP: 122/82  Pulse: 91  Height:  (1.575 m)  Weight: 146 lb (66.225 kg)  SpO2: 98%    Weights Current Weight  10/25/14 146 lb (66.225 kg)  04/21/14 150 lb 3.2 oz (68.13 kg)  02/23/14 128 lb 11.2 oz (58.378 kg)    Blood Pressure  BP Readings from Last 3 Encounters:  10/25/14 122/82  06/02/14 110/66  04/21/14 125/80     Admit date:  (Not on file) Last encounter with RMR:  Visit date not found   Allergy Penicillins  Current Outpatient Prescriptions  Medication Sig Dispense Refill  . amLODipine (NORVASC) 5 MG tablet Take 5 mg by mouth daily.    . ARIPiprazole (ABILIFY) 5 MG tablet Take 5 mg by mouth daily.    Marland Kitchen aspirin EC 81 MG tablet Take 81 mg by mouth daily.    Marland Kitchen atorvastatin (LIPITOR) 80 MG tablet Take 1 tablet (80 mg total) by mouth daily at 6 PM. 30 tablet 11  . benztropine (COGENTIN) 1 MG tablet Take 1 mg by mouth at bedtime.    . clonazePAM (KLONOPIN) 0.5 MG tablet Take 0.5 mg by mouth at bedtime.    . cloZAPine (CLOZARIL) 100 MG tablet Take 150-500 mg by mouth 2 (two) times daily. Take  by mouth in AM and  by mouth in PM    . docusate sodium (COLACE) 100 MG capsule Take 100 mg by mouth daily.    . DULoxetine (CYMBALTA) 60 MG capsule Take 60 mg by mouth daily.    Marland Kitchen gemfibrozil (LOPID) 600 MG tablet Take 600 mg by mouth 2 (two) times daily before a meal.    . hydrocortisone (ANUSOL-HC) 2.5 % rectal cream Place 1 application rectally 2 (two) times daily. 30 g 1  . insulin aspart (NOVOLOG) 100 UNIT/ML injection Inject 10 Units into the skin 3 (three) times daily before meals.    . insulin detemir (LEVEMIR) 100 UNIT/ML injection 30 units of Levemir At lunchtime x 1 week, then increase back  to 35 units daily 10 mL 11  . latanoprost (XALATAN) 0.005 % ophthalmic solution Place 1 drop into both eyes at bedtime.    . metoprolol (LOPRESSOR) 50 MG tablet Take 1 tablet (50 mg total) by mouth 2 (two) times daily. 180 tablet 3  . nitroGLYCERIN (NITROSTAT) 0.4 MG SL tablet Place 1 tablet (0.4 mg total) under the tongue every 5 (five) minutes x 3 doses as needed for chest pain. 25 tablet 12  . omeprazole (PRILOSEC) 40 MG capsule Take 40 mg by mouth 2 (two) times daily.     Marland Kitchen saccharomyces boulardii (FLORASTOR) 250 MG capsule Take 1 capsule (250 mg total) by mouth 2 (two) times daily. 28 capsule 0  . tamsulosin (FLOMAX) 0.4 MG CAPS capsule Take 0.4 mg by mouth daily.     . ticagrelor (BRILINTA) 90 MG TABS tablet Take 1 tablet (90 mg total) by mouth 2 (two) times daily. 60 tablet 11  . traZODone (DESYREL) 50 MG tablet Take 50 mg by mouth at bedtime.     No current facility-administered medications for this visit.  Discontinued Meds:   There are no discontinued medications.  Patient Active Problem List   Diagnosis Date Noted  . Encounter for screening colonoscopy 04/21/2014  . Cholelithiases 04/21/2014  . Hemorrhoids 04/21/2014  . Elevated troponin 02/24/2014  . Nausea and vomiting 02/23/2014  . Abdominal pain   . C. difficile colitis   . Hypokalemia   . Vomiting and diarrhea 02/22/2014  . Coronary artery disease involving native heart without angina pectoris 12/02/2013  . Hyperlipidemia 12/02/2013  . Tobacco abuse   . Peripheral vascular disease   . Schizophrenia   . Essential hypertension     LABS    Component Value Date/Time   NA 143 02/26/2014 0629   NA 141 02/25/2014 0551   NA 139 02/24/2014 0611   NA 139 07/08/2013 1428   NA 139 06/21/2011 1317   K 4.0 02/26/2014 0629   K 3.9 02/25/2014 0551   K 2.8* 02/24/2014 0611   K 3.9 07/08/2013 1428   K 3.9 06/21/2011 1317   CL 114* 02/26/2014 0629   CL 115* 02/25/2014 0551   CL 109 02/24/2014 0611   CL 108*  07/08/2013 1428   CL 105 06/21/2011 1317   CO2 25 02/26/2014 0629   CO2 20 02/25/2014 0551   CO2 22 02/24/2014 0611   CO2 24 07/08/2013 1428   CO2 23 06/21/2011 1317   GLUCOSE 121* 02/26/2014 0629   GLUCOSE 119* 02/25/2014 0551   GLUCOSE 130* 02/24/2014 0611   GLUCOSE 63* 07/08/2013 1428   GLUCOSE 160* 06/21/2011 1317   BUN 8 02/26/2014 0629   BUN 10 02/25/2014 0551   BUN 16 02/24/2014 0611   BUN 19* 07/08/2013 1428   BUN 19* 06/21/2011 1317   CREATININE 1.00 02/26/2014 0629   CREATININE 0.90 02/25/2014 0551   CREATININE 1.15 02/24/2014 0611   CREATININE 1.46* 07/08/2013 1428   CREATININE 1.34* 06/21/2011 1317   CALCIUM 8.3* 02/26/2014 0629   CALCIUM 7.9* 02/25/2014 0551   CALCIUM 7.8* 02/24/2014 0611   CALCIUM 9.2 07/08/2013 1428   CALCIUM 9.0 06/21/2011 1317   GFRNONAA 83* 02/26/2014 0629   GFRNONAA >90 02/25/2014 0551   GFRNONAA 70* 02/24/2014 0611   GFRNONAA 54* 07/08/2013 1428   GFRNONAA >60 06/21/2011 1317   GFRAA >90 02/26/2014 0629   GFRAA >90 02/25/2014 0551   GFRAA 82* 02/24/2014 0611   GFRAA >60 07/08/2013 1428   GFRAA >60 06/21/2011 1317   CMP     Component Value Date/Time   NA 143 02/26/2014 0629   NA 139 07/08/2013 1428   K 4.0 02/26/2014 0629   K 3.9 07/08/2013 1428   CL 114* 02/26/2014 0629   CL 108* 07/08/2013 1428   CO2 25 02/26/2014 0629   CO2 24 07/08/2013 1428   GLUCOSE 121* 02/26/2014 0629   GLUCOSE 63* 07/08/2013 1428   BUN 8 02/26/2014 0629   BUN 19* 07/08/2013 1428   CREATININE 1.00 02/26/2014 0629   CREATININE 1.46* 07/08/2013 1428   CALCIUM 8.3* 02/26/2014 0629   CALCIUM 9.2 07/08/2013 1428   PROT 5.4* 02/26/2014 0629   PROT 7.7 07/08/2013 1428   ALBUMIN 2.8* 02/26/2014 0629   ALBUMIN 3.7 07/08/2013 1428   AST 21 02/26/2014 0629   AST 29 07/08/2013 1428   ALT 22 02/26/2014 0629   ALT 19 07/08/2013 1428   ALKPHOS 63 02/26/2014 0629   ALKPHOS 95 07/08/2013 1428   BILITOT 0.4 02/26/2014 0629   BILITOT 0.4 07/08/2013 1428    GFRNONAA 83* 02/26/2014 0629  GFRNONAA 54* 07/08/2013 1428   GFRAA >90 02/26/2014 0629   GFRAA >60 07/08/2013 1428       Component Value Date/Time   WBC 9.6 02/26/2014 0629   WBC 8.8 02/25/2014 0551   WBC 7.0 02/24/2014 0611   WBC 12.8* 07/08/2013 1428   WBC 12.3* 06/21/2011 1317   HGB 12.6* 02/26/2014 0629   HGB 12.8* 02/25/2014 0551   HGB 13.6 02/24/2014 0611   HGB 16.8 07/08/2013 1428   HGB 17.4 06/21/2011 1317   HCT 37.7* 02/26/2014 0629   HCT 37.9* 02/25/2014 0551   HCT 40.1 02/24/2014 0611   HCT 49.7 07/08/2013 1428   HCT 51.6 06/21/2011 1317   MCV 87.1 02/26/2014 0629   MCV 86.9 02/25/2014 0551   MCV 87.4 02/24/2014 0611   MCV 90 07/08/2013 1428   MCV 88 06/21/2011 1317    Lipid Panel     Component Value Date/Time   CHOL 102 11/21/2013 0123   TRIG 66 11/21/2013 0123   HDL 27* 11/21/2013 0123   CHOLHDL 3.8 11/21/2013 0123   VLDL 13 11/21/2013 0123   LDLCALC 62 11/21/2013 0123    ABG    Component Value Date/Time   TCO2 22 11/20/2013 1632     Lab Results  Component Value Date   TSH 1.490 11/21/2013   BNP (last 3 results) No results for input(s): BNP in the last 8760 hours.  ProBNP (last 3 results) No results for input(s): PROBNP in the last 8760 hours.  Cardiac Panel (last 3 results) No results for input(s): CKTOTAL, CKMB, TROPONINI, RELINDX in the last 72 hours.  Iron/TIBC/Ferritin/ %Sat No results found for: IRON, TIBC, FERRITIN, IRONPCTSAT   EKG Orders placed or performed during the hospital encounter of 02/22/14  . ED EKG  . ED EKG  . EKG 12-Lead  . EKG 12-Lead  . ED EKG  . ED EKG  . EKG     Prior Assessment and Plan Problem List as of 10/25/2014      Cardiovascular and Mediastinum   Peripheral vascular disease   Essential hypertension   Last Assessment & Plan 01/13/2014 Office Visit Written 01/13/2014 10:45 AM by Jonelle Sidle, MD    Blood pressure much better controlled today. No additional changes made to current regimen.       Coronary artery disease involving native heart without angina pectoris   Last Assessment & Plan 01/13/2014 Office Visit Written 01/13/2014 10:43 AM by Jonelle Sidle, MD    Symptomatically stable at this time status post lateral STEMI in September with placement of DES to an occluded RCA. Plan is to continue medical therapy including the DAPT. Follow-up arranged in the next 3 months.      Hemorrhoids   Last Assessment & Plan 04/21/2014 Office Visit Written 04/21/2014 11:50 AM by Sherril Cong, NP    Intermittent irritation and low volume toilet tissue hematochezia. Will rx Anusol rectal cream for symptoms, bid prn no more than 7 days at a time. Due for colonoscopy which we will setup pending cardiology clearance (see screening colonoscopy A&P for details)        Digestive   Nausea and vomiting   C. difficile colitis   Cholelithiases   Last Assessment & Plan 04/21/2014 Office Visit Written 04/21/2014 11:52 AM by Sherril Cong, NP    Incidental CT finding during hospitalization for C. Diff colitis. Cholelithiasis without obstruction or associated inflammation, asymptomatic. With recent C. Diff and STEMI/PCI, and asymptomatic presentation will hold off on surgical  referral at this time (likely need for postop abx with increased risk of C. Diff). If becomes symptomatic can refer to general surgery for evaluation either by [place] or PCP.        Other   Tobacco abuse   Last Assessment & Plan 01/13/2014 Office Visit Written 01/13/2014 10:44 AM by Jonelle Sidle, MD    He is actively working on quitting smoking. Nicotine gum, should be okay for him to use at this point.      Schizophrenia   Hyperlipidemia   Last Assessment & Plan 01/13/2014 Office Visit Written 01/13/2014 10:44 AM by Jonelle Sidle, MD    He continues on high-dose Lipitor. Follow-up FLP and LFTs to be arranged.      Vomiting and diarrhea   Abdominal pain   Hypokalemia   Elevated troponin   Encounter for  screening colonoscopy   Last Assessment & Plan 04/21/2014 Office Visit Edited 04/21/2014 11:49 AM by Sherril Cong, NP    54 year old AA male never had colonoscopy before. On Brilenta for PCI in 10/2013, low risk procedure, no indications to hold. Will get cardiology clearance to move forward with procedure given recent PCI. Intermittent low volume tissue hematochezia with known history of hemorrhoids, no overt GI bleed or melena. On multiple meds including Klonopin, Cymbalta, cogentin and history of Schizophrenia and recommend OR procedure with propofol.  Proceed with colonoscopy with Dr. Darrick Penna in the OR with propofol (pending cardiology clearance) in the near future. The risks, benefits, and alternatives have been discussed in detail with the patient. They state understanding and desire to proceed.            Imaging: No results found.

## 2014-11-03 ENCOUNTER — Ambulatory Visit: Payer: Medicare Other | Admitting: Cardiology

## 2014-11-19 ENCOUNTER — Other Ambulatory Visit: Payer: Self-pay | Admitting: Cardiovascular Disease

## 2015-08-10 ENCOUNTER — Ambulatory Visit: Payer: Medicare Other | Admitting: Nurse Practitioner

## 2015-10-14 ENCOUNTER — Other Ambulatory Visit: Payer: Self-pay | Admitting: Cardiovascular Disease

## 2015-10-20 ENCOUNTER — Encounter: Payer: Self-pay | Admitting: Adult Health

## 2015-10-25 ENCOUNTER — Encounter: Payer: Self-pay | Admitting: Cardiology

## 2015-10-25 ENCOUNTER — Ambulatory Visit (INDEPENDENT_AMBULATORY_CARE_PROVIDER_SITE_OTHER): Payer: Medicare Other | Admitting: Cardiology

## 2015-10-25 VITALS — BP 122/62 | HR 68 | Ht 62.0 in | Wt 144.0 lb

## 2015-10-25 DIAGNOSIS — E785 Hyperlipidemia, unspecified: Secondary | ICD-10-CM | POA: Diagnosis not present

## 2015-10-25 DIAGNOSIS — I10 Essential (primary) hypertension: Secondary | ICD-10-CM

## 2015-10-25 DIAGNOSIS — I2510 Atherosclerotic heart disease of native coronary artery without angina pectoris: Secondary | ICD-10-CM

## 2015-10-25 DIAGNOSIS — I739 Peripheral vascular disease, unspecified: Secondary | ICD-10-CM | POA: Diagnosis not present

## 2015-10-25 DIAGNOSIS — Z794 Long term (current) use of insulin: Secondary | ICD-10-CM

## 2015-10-25 DIAGNOSIS — Z9861 Coronary angioplasty status: Secondary | ICD-10-CM

## 2015-10-25 DIAGNOSIS — E1151 Type 2 diabetes mellitus with diabetic peripheral angiopathy without gangrene: Secondary | ICD-10-CM

## 2015-10-25 DIAGNOSIS — Z720 Tobacco use: Secondary | ICD-10-CM

## 2015-10-25 DIAGNOSIS — E1159 Type 2 diabetes mellitus with other circulatory complications: Secondary | ICD-10-CM | POA: Insufficient documentation

## 2015-10-25 DIAGNOSIS — E119 Type 2 diabetes mellitus without complications: Secondary | ICD-10-CM | POA: Insufficient documentation

## 2015-10-25 NOTE — Assessment & Plan Note (Signed)
Controlled.  

## 2015-10-25 NOTE — Assessment & Plan Note (Signed)
PCP follows 

## 2015-10-25 NOTE — Assessment & Plan Note (Signed)
IDDM 

## 2015-10-25 NOTE — Patient Instructions (Signed)

## 2015-10-25 NOTE — Progress Notes (Signed)
10/25/2015 Oscar Elliott   [birth year]  [id number]  Primary Physician Anselm Jungling, NP Primary Cardiologist: Dr Diona Browner  HPI:  55 y.o. AA male who presents for follow up. He has a history of hypertension, CAD- lateral STEMI DES to the RCA Sept 2015, hyperlipidemia., Type 2 IDDM, and PVD- s/p L AKA in 2009. He was last seen by Dr. Diona Browner in April of 2016, seen by Joni Reining in Aug 2016. He has been symptomatically stable, with no changes in his medical management. He lives in an assisted living facility and his caretaker accompanied him and provided details of the pt's history as the pt appears to have some degree of cognitive deficit.    Current Outpatient Prescriptions  Medication Sig Dispense Refill  . amLODipine (NORVASC) 5 MG tablet Take 5 mg by mouth daily.    . ARIPiprazole (ABILIFY) 5 MG tablet Take 5 mg by mouth daily.    Marland Kitchen aspirin EC 81 MG tablet Take 81 mg by mouth daily.    Marland Kitchen atorvastatin (LIPITOR) 80 MG tablet TAKE ONE TABLET BY MOUTH ONCE DAILY AT 6PM. 30 tablet 11  . benztropine (COGENTIN) 1 MG tablet Take 1 mg by mouth at bedtime.    Marland Kitchen BRILINTA 90 MG TABS tablet TAKE (1) TABLET BY MOUTH TWICE DAILY. 60 tablet 3  . clonazePAM (KLONOPIN) 0.5 MG tablet Take 0.5 mg by mouth at bedtime.    . cloZAPine (CLOZARIL) 100 MG tablet Take 150-500 mg by mouth 2 (two) times daily. Take 150mg  by mouth in AM and 500mg  by mouth in PM    . docusate sodium (COLACE) 100 MG capsule Take 100 mg by mouth daily.    . DULoxetine (CYMBALTA) 60 MG capsule Take 60 mg by mouth daily.    Marland Kitchen gemfibrozil (LOPID) 600 MG tablet Take 600 mg by mouth 2 (two) times daily before a meal.    . hydrocortisone (ANUSOL-HC) 2.5 % rectal cream Place 1 application rectally 2 (two) times daily. 30 g 1  . insulin aspart (NOVOLOG) 100 UNIT/ML injection Inject 10 Units into the skin 3 (three) times daily before meals.    . insulin detemir (LEVEMIR) 100 UNIT/ML injection 30 units of Levemir At lunchtime x 1 week,  then increase back to 35 units daily 10 mL 11  . latanoprost (XALATAN) 0.005 % ophthalmic solution Place 1 drop into both eyes at bedtime.    . metoprolol (LOPRESSOR) 50 MG tablet Take 1 tablet (50 mg total) by mouth 2 (two) times daily. 180 tablet 3  . nitroGLYCERIN (NITROSTAT) 0.4 MG SL tablet Place 1 tablet (0.4 mg total) under the tongue every 5 (five) minutes x 3 doses as needed for chest pain. 25 tablet 12  . omeprazole (PRILOSEC) 40 MG capsule Take 40 mg by mouth 2 (two) times daily.     Marland Kitchen saccharomyces boulardii (FLORASTOR) 250 MG capsule Take 1 capsule (250 mg total) by mouth 2 (two) times daily. 28 capsule 0  . tamsulosin (FLOMAX) 0.4 MG CAPS capsule Take 0.4 mg by mouth daily.     . traZODone (DESYREL) 50 MG tablet Take 50 mg by mouth at bedtime.     No current facility-administered medications for this visit.     Allergies  Allergen Reactions  . Penicillins Other (See Comments)    Reaction unknown    Social History   Social History  . Marital status: Single    Spouse name: N/A  . Number of children: N/A  . Years of education: N/A  Occupational History  . Not on file.   Social History Main Topics  . Smoking status: Current Every Day Smoker    Packs/day: 0.25    Years: 20.00    Types: Cigars, Cigarettes  . Smokeless tobacco: Never Used  . Alcohol use No  . Drug use: No  . Sexual activity: No   Other Topics Concern  . Not on file   Social History Narrative  . No narrative on file     Review of Systems: General: negative for chills, fever, night sweats or weight changes.  Cardiovascular: negative for chest pain, dyspnea on exertion, edema, orthopnea, palpitations, paroxysmal nocturnal dyspnea or shortness of breath Dermatological: negative for rash Respiratory: negative for cough or wheezing Urologic: negative for hematuria Abdominal: negative for nausea, vomiting, diarrhea, bright red blood per rectum, melena, or hematemesis Neurologic: negative for  visual changes, syncope, or dizziness C/O back and shoulder pain All other systems reviewed and are otherwise negative except as noted above.    Blood pressure 122/62, pulse 68, height 5\' 2"  (1.575 m), weight 144 lb (65.3 kg), SpO2 98 %.  General appearance: alert, cooperative and no distress Neck: no carotid bruit and no JVD Lungs: clear to auscultation bilaterally Heart: regular rate and rhythm Extremities: extremities normal, atraumatic, no cyanosis or edema Neurologic: Grossly normal  EKG NSR, septal Qs  ASSESSMENT AND PLAN:   Hyperlipidemia PCP follows  Essential hypertension Controlled  Peripheral vascular disease a. 06/2007: s/p L AKA @ [facility]  Tobacco abuse .  Type 2 diabetes mellitus with circulatory disorder [facility](HCC) IDDM   PLAN  Same cardiac Rx. Will ask PCP for any recent lab work. F/U in 6 months.   Corine ShelterLuke Korah Hufstedler PA-C 10/25/2015 1:32 PM

## 2015-10-25 NOTE — Assessment & Plan Note (Signed)
a[facility]

## 2016-02-14 ENCOUNTER — Other Ambulatory Visit: Payer: Self-pay | Admitting: Adult Health

## 2016-05-02 NOTE — Progress Notes (Deleted)
Cardiology Office Note  Date: 05/02/2016   ID: Oscar Elliott, DOB 07-Dec-1960, MRN [id number]  PCP: Anselm Jungling, NP  Primary Cardiologist: Nona Dell, MD   No chief complaint on file.   History of Present Illness: Oscar Elliott is a 56 y.o. male last seen by [name] in August 2017.  Echocardiogram from 2016 is outlined below.  Past Medical History:  Diagnosis Date  . CAD (coronary artery disease)    a. 10/2013 Lateral STEMI/PCI: LM nl, LAD min irregs, D1 occluded sub branch, LCX min irregs mid, OM large/patent, RCA 163m (2.75x28 Promus DES).  . Essential hypertension   . History of renal failure    a. 06/2007: in setting of rhabdo post L AKA.  Marland Kitchen Hyperlipidemia   . MI (myocardial infarction)   . Peripheral vascular disease (HCC)    a. 06/2007: s/p L AKA @ [facility]  . Schizophrenia (HCC)   . Type 2 diabetes mellitus (HCC)     Past Surgical History:  Procedure Laterality Date  . Above-the-knee amputation Left   . LEFT HEART CATHETERIZATION WITH CORONARY ANGIOGRAM N/A 11/20/2013   Procedure: LEFT HEART CATHETERIZATION WITH CORONARY ANGIOGRAM;  Surgeon: Corky Crafts, MD;  Location: [facility] CATH LAB;  Service: Cardiovascular;  Laterality: N/A;    Current Outpatient Prescriptions  Medication Sig Dispense Refill  . amLODipine (NORVASC) 5 MG tablet Take 5 mg by mouth daily.    . ARIPiprazole (ABILIFY) 5 MG tablet Take 5 mg by mouth daily.    Marland Kitchen aspirin EC 81 MG tablet Take 81 mg by mouth daily.    Marland Kitchen atorvastatin (LIPITOR) 80 MG tablet TAKE ONE TABLET BY MOUTH ONCE DAILY AT 6PM. 30 tablet 11  . benztropine (COGENTIN) 1 MG tablet Take 1 mg by mouth at bedtime.    Marland Kitchen BRILINTA 90 MG TABS tablet TAKE (1) TABLET BY MOUTH TWICE DAILY. 60 tablet 3  . clonazePAM (KLONOPIN) 0.5 MG tablet Take 0.5 mg by mouth at bedtime.    . cloZAPine (CLOZARIL) 100 MG tablet Take 150-500 mg by mouth 2 (two) times daily. Take 150mg  by mouth in AM and 500mg  by mouth in PM    . docusate sodium  (COLACE) 100 MG capsule Take 100 mg by mouth daily.    . DULoxetine (CYMBALTA) 60 MG capsule Take 60 mg by mouth daily.    Marland Kitchen gemfibrozil (LOPID) 600 MG tablet Take 600 mg by mouth 2 (two) times daily before a meal.    . hydrocortisone (ANUSOL-HC) 2.5 % rectal cream Place 1 application rectally 2 (two) times daily. 30 g 1  . insulin aspart (NOVOLOG) 100 UNIT/ML injection Inject 10 Units into the skin 3 (three) times daily before meals.    . insulin detemir (LEVEMIR) 100 UNIT/ML injection 30 units of Levemir At lunchtime x 1 week, then increase back to 35 units daily 10 mL 11  . latanoprost (XALATAN) 0.005 % ophthalmic solution Place 1 drop into both eyes at bedtime.    . metoprolol (LOPRESSOR) 50 MG tablet Take 1 tablet (50 mg total) by mouth 2 (two) times daily. 180 tablet 3  . nitroGLYCERIN (NITROSTAT) 0.4 MG SL tablet Place 1 tablet (0.4 mg total) under the tongue every 5 (five) minutes x 3 doses as needed for chest pain. 25 tablet 12  . omeprazole (PRILOSEC) 40 MG capsule Take 40 mg by mouth 2 (two) times daily.     Marland Kitchen saccharomyces boulardii (FLORASTOR) 250 MG capsule Take 1 capsule (250 mg total) by mouth  2 (two) times daily. 28 capsule 0  . tamsulosin (FLOMAX) 0.4 MG CAPS capsule Take 0.4 mg by mouth daily.     . traZODone (DESYREL) 50 MG tablet Take 50 mg by mouth at bedtime.     No current facility-administered medications for this visit.    Allergies:  Penicillins   Social History: The patient  reports that he has been smoking Cigars and Cigarettes.  He has a 5.00 pack-year smoking history. He has never used smokeless tobacco. He reports that he does not drink alcohol or use drugs.   Family History: The patient's family history is not on file.   ROS:  Please see the history of present illness. Otherwise, complete review of systems is positive for {NONE DEFAULTED:18576::"none"}.  All other systems are reviewed and negative.   Physical Exam: VS:  There were no vitals taken for this  visit., BMI There is no height or weight on file to calculate BMI.  Wt Readings from Last 3 Encounters:  10/25/15 144 lb (65.3 kg)  10/25/14 146 lb (66.2 kg)  04/21/14 150 lb 3.2 oz (68.1 kg)    Patient appears comfortable at rest. HEENT: Conjunctiva and lids normal, oropharynx clear. Neck: Supple, no elevated JVP or carotid bruits, no thyromegaly. Lungs: Clear to auscultation, nonlabored breathing at rest. Cardiac: Regular rate and rhythm, no S3 or significant systolic murmur, no pericardial rub. Abdomen: Soft, nontender, bowel sounds present. Extremities: Status post left AKA, no pitting edema on right, distal pulses 1-2+.  ECG: I personally reviewed the tracing from 10/25/2015 which showed sinus rhythm with poor R-wave progression, low voltage, nonspecific T-wave changes.  Recent Labwork:  December 2016: Hemoglobin 14, platelets 180, BUN 14, creatinine 1.2, AST 13, ALT 11, potassium 3.7, cholesterol 119, HDL 42, LDL 57, cholesterol 102, TSH 0.50, hemoglobin A1c 5.7  Other Studies Reviewed Today:  Echocardiogram 11/21/2013: Study Conclusions  - Left ventricle: Hypokinesis of the basal inferior, inferolateral and inferoseptal walls. The cavity size was normal. There was moderate concentric hypertrophy. Systolic function was normal. The estimated ejection fraction was in the range of 50% to 55%. Wall motion was normal; there were no regional wall motion abnormalities. Left ventricular diastolic function parameters were normal. - Aortic valve: Trileaflet; normal thickness leaflets. There was no regurgitation. - Left atrium: The atrium was normal in size. - Right ventricle: The cavity size was normal. Wall thickness was moderately increased. Systolic function was mildly reduced. - Tricuspid valve: There was mild regurgitation. - Pulmonic valve: There was no regurgitation. - Pulmonary arteries: Systolic pressure was within the normal range. - Pericardium,  extracardiac: A mild circumferential pericardial effusion was identified. Features were not consistent with tamponade physiology.  Impressions:  - Low normal LV function with hypokinesis in the basal inferoseptal, inferior and inferolateral leads. Mildly reduced right ventricular systolic function.  Assessment and Plan:   Current medicines were reviewed with the patient today.  No orders of the defined types were placed in this encounter.   Disposition:  Signed, Jonelle SidleSamuel G. Bensen Chadderdon, MD, [facility] CenterFACC 05/02/2016 12:26 PM    [facility] [address], [place], [place] [address] Phone: [phone]; Fax: [phone]

## 2016-05-03 ENCOUNTER — Encounter: Payer: Self-pay | Admitting: Cardiology

## 2016-05-03 ENCOUNTER — Ambulatory Visit: Payer: Medicare Other | Admitting: Cardiology

## 2016-05-30 ENCOUNTER — Emergency Department (HOSPITAL_COMMUNITY): Payer: Medicare Other

## 2016-05-30 ENCOUNTER — Encounter (HOSPITAL_COMMUNITY): Payer: Self-pay | Admitting: *Deleted

## 2016-05-30 ENCOUNTER — Inpatient Hospital Stay (HOSPITAL_COMMUNITY)
Admission: EM | Admit: 2016-05-30 | Discharge: 2016-06-01 | DRG: 638 | Disposition: A | Discharge status: Nursing Home (Includes ICF) | Payer: Medicare Other | Attending: Internal Medicine | Admitting: Internal Medicine

## 2016-05-30 DIAGNOSIS — Z9861 Coronary angioplasty status: Secondary | ICD-10-CM

## 2016-05-30 DIAGNOSIS — Z955 Presence of coronary angioplasty implant and graft: Secondary | ICD-10-CM | POA: Diagnosis not present

## 2016-05-30 DIAGNOSIS — F209 Schizophrenia, unspecified: Secondary | ICD-10-CM

## 2016-05-30 DIAGNOSIS — E11649 Type 2 diabetes mellitus with hypoglycemia without coma: Principal | ICD-10-CM | POA: Diagnosis present

## 2016-05-30 DIAGNOSIS — R748 Abnormal levels of other serum enzymes: Secondary | ICD-10-CM

## 2016-05-30 DIAGNOSIS — I2510 Atherosclerotic heart disease of native coronary artery without angina pectoris: Secondary | ICD-10-CM | POA: Diagnosis not present

## 2016-05-30 DIAGNOSIS — R7989 Other specified abnormal findings of blood chemistry: Secondary | ICD-10-CM | POA: Diagnosis present

## 2016-05-30 DIAGNOSIS — E162 Hypoglycemia, unspecified: Secondary | ICD-10-CM

## 2016-05-30 DIAGNOSIS — E1151 Type 2 diabetes mellitus with diabetic peripheral angiopathy without gangrene: Secondary | ICD-10-CM | POA: Diagnosis not present

## 2016-05-30 DIAGNOSIS — E782 Mixed hyperlipidemia: Secondary | ICD-10-CM | POA: Diagnosis not present

## 2016-05-30 DIAGNOSIS — I252 Old myocardial infarction: Secondary | ICD-10-CM

## 2016-05-30 DIAGNOSIS — Z7982 Long term (current) use of aspirin: Secondary | ICD-10-CM

## 2016-05-30 DIAGNOSIS — Z794 Long term (current) use of insulin: Secondary | ICD-10-CM | POA: Diagnosis not present

## 2016-05-30 DIAGNOSIS — I10 Essential (primary) hypertension: Secondary | ICD-10-CM | POA: Diagnosis not present

## 2016-05-30 DIAGNOSIS — F17290 Nicotine dependence, other tobacco product, uncomplicated: Secondary | ICD-10-CM | POA: Diagnosis present

## 2016-05-30 DIAGNOSIS — R4189 Other symptoms and signs involving cognitive functions and awareness: Secondary | ICD-10-CM | POA: Diagnosis present

## 2016-05-30 DIAGNOSIS — Z89612 Acquired absence of left leg above knee: Secondary | ICD-10-CM

## 2016-05-30 DIAGNOSIS — E785 Hyperlipidemia, unspecified: Secondary | ICD-10-CM | POA: Diagnosis not present

## 2016-05-30 DIAGNOSIS — Z720 Tobacco use: Secondary | ICD-10-CM | POA: Diagnosis not present

## 2016-05-30 DIAGNOSIS — E119 Type 2 diabetes mellitus without complications: Secondary | ICD-10-CM | POA: Diagnosis present

## 2016-05-30 DIAGNOSIS — Z79899 Other long term (current) drug therapy: Secondary | ICD-10-CM

## 2016-05-30 DIAGNOSIS — I248 Other forms of acute ischemic heart disease: Secondary | ICD-10-CM | POA: Diagnosis not present

## 2016-05-30 DIAGNOSIS — Z23 Encounter for immunization: Secondary | ICD-10-CM

## 2016-05-30 DIAGNOSIS — R778 Other specified abnormalities of plasma proteins: Secondary | ICD-10-CM | POA: Diagnosis present

## 2016-05-30 DIAGNOSIS — N390 Urinary tract infection, site not specified: Secondary | ICD-10-CM | POA: Diagnosis present

## 2016-05-30 DIAGNOSIS — Z880 Allergy status to penicillin: Secondary | ICD-10-CM

## 2016-05-30 DIAGNOSIS — E1159 Type 2 diabetes mellitus with other circulatory complications: Secondary | ICD-10-CM | POA: Diagnosis present

## 2016-05-30 DIAGNOSIS — K5900 Constipation, unspecified: Secondary | ICD-10-CM | POA: Diagnosis present

## 2016-05-30 LAB — CBC
HCT: 39.7 % (ref 39.0–52.0)
Hemoglobin: 13.3 g/dL (ref 13.0–17.0)
MCH: 29.2 pg (ref 26.0–34.0)
MCHC: 33.5 g/dL (ref 30.0–36.0)
MCV: 87.3 fL (ref 78.0–100.0)
PLATELETS: 120 10*3/uL — AB (ref 150–400)
RBC: 4.55 MIL/uL (ref 4.22–5.81)
RDW: 16.5 % — AB (ref 11.5–15.5)
WBC: 8.3 10*3/uL (ref 4.0–10.5)

## 2016-05-30 LAB — URINALYSIS, ROUTINE W REFLEX MICROSCOPIC
BILIRUBIN URINE: NEGATIVE
GLUCOSE, UA: NEGATIVE mg/dL
HGB URINE DIPSTICK: NEGATIVE
Ketones, ur: NEGATIVE mg/dL
NITRITE: NEGATIVE
Protein, ur: NEGATIVE mg/dL
SPECIFIC GRAVITY, URINE: 1.009 (ref 1.005–1.030)
pH: 5 (ref 5.0–8.0)

## 2016-05-30 LAB — CBG MONITORING, ED
Glucose-Capillary: 199 mg/dL — ABNORMAL HIGH (ref 65–99)
Glucose-Capillary: 247 mg/dL — ABNORMAL HIGH (ref 65–99)
Glucose-Capillary: 172 mg/dL — ABNORMAL HIGH (ref 65–99)

## 2016-05-30 LAB — CBC WITH DIFFERENTIAL/PLATELET
BASOS PCT: 0 %
Basophils Absolute: 0 10*3/uL (ref 0.0–0.1)
EOS PCT: 0 %
Eosinophils Absolute: 0 10*3/uL (ref 0.0–0.7)
HEMATOCRIT: 40.2 % (ref 39.0–52.0)
Hemoglobin: 13.6 g/dL (ref 13.0–17.0)
LYMPHS PCT: 12 %
Lymphs Abs: 0.9 10*3/uL (ref 0.7–4.0)
MCH: 29.7 pg (ref 26.0–34.0)
MCHC: 33.8 g/dL (ref 30.0–36.0)
MCV: 87.8 fL (ref 78.0–100.0)
MONO ABS: 0.3 10*3/uL (ref 0.1–1.0)
MONOS PCT: 4 %
NEUTROS ABS: 6.2 10*3/uL (ref 1.7–7.7)
Neutrophils Relative %: 84 %
PLATELETS: 125 10*3/uL — AB (ref 150–400)
RBC: 4.58 MIL/uL (ref 4.22–5.81)
RDW: 16.5 % — AB (ref 11.5–15.5)
WBC: 7.3 10*3/uL (ref 4.0–10.5)

## 2016-05-30 LAB — COMPREHENSIVE METABOLIC PANEL
ALT: 14 U/L — ABNORMAL LOW (ref 17–63)
AST: 17 U/L (ref 15–41)
Albumin: 3.7 g/dL (ref 3.5–5.0)
Alkaline Phosphatase: 80 U/L (ref 38–126)
Anion gap: 8 (ref 5–15)
BILIRUBIN TOTAL: 0.6 mg/dL (ref 0.3–1.2)
BUN: 17 mg/dL (ref 6–20)
CO2: 24 mmol/L (ref 22–32)
Calcium: 8.9 mg/dL (ref 8.9–10.3)
Chloride: 107 mmol/L (ref 101–111)
Creatinine, Ser: 1.15 mg/dL (ref 0.61–1.24)
GFR calc Af Amer: >60 mL/min (ref >60)
Glucose, Bld: 183 mg/dL — ABNORMAL HIGH (ref 65–99)
POTASSIUM: 3.7 mmol/L (ref 3.5–5.1)
Sodium: 139 mmol/L (ref 135–145)
TOTAL PROTEIN: 6.7 g/dL (ref 6.5–8.1)

## 2016-05-30 LAB — GLUCOSE, CAPILLARY
Glucose-Capillary: 127 mg/dL — ABNORMAL HIGH (ref 65–99)
Glucose-Capillary: 107 mg/dL — ABNORMAL HIGH (ref 65–99)
Glucose-Capillary: 94 mg/dL (ref 65–99)

## 2016-05-30 LAB — CREATININE, SERUM
CREATININE: 1.17 mg/dL (ref 0.61–1.24)
GFR calc non Af Amer: >60 mL/min (ref >60)

## 2016-05-30 LAB — TROPONIN I
TROPONIN I: 0.91 ng/mL — AB (ref <0.03)
TROPONIN I: 0.8 ng/mL — AB (ref <0.03)
Troponin I: 0.83 ng/mL (ref <0.03)
Troponin I: 0.79 ng/mL (ref <0.03)
Troponin I: 0.74 ng/mL (ref <0.03)

## 2016-05-30 LAB — MRSA PCR SCREENING: MRSA BY PCR: NEGATIVE

## 2016-05-30 MED ORDER — NITROGLYCERIN 0.4 MG SL SUBL: 0.4000 mg | SUBLINGUAL_TABLET | SUBLINGUAL | Status: DC | PRN

## 2016-05-30 MED ORDER — TAMSULOSIN HCL 0.4 MG PO CAPS
0.4000 mg | ORAL_CAPSULE | Freq: Every day | ORAL | Status: DC
  Administered 2016-05-30 – 2016-06-01 (×3): 0.4 mg via ORAL
  Filled 2016-05-30 (×3): qty 1

## 2016-05-30 MED ORDER — ACETAMINOPHEN 325 MG PO TABS
650.0000 mg | ORAL_TABLET | ORAL | Status: DC | PRN
  Administered 2016-05-31 – 2016-06-01 (×2): 650 mg via ORAL
  Filled 2016-05-30 (×2): qty 2

## 2016-05-30 MED ORDER — INSULIN ASPART 100 UNIT/ML ~~LOC~~ SOLN
0.0000 [IU] | Freq: Three times a day (TID) | SUBCUTANEOUS | Status: DC
  Administered 2016-05-30 – 2016-06-01 (×2): 1 [IU] via SUBCUTANEOUS
  Administered 2016-06-01: 2 [IU] via SUBCUTANEOUS

## 2016-05-30 MED ORDER — CLONAZEPAM 0.5 MG PO TABS
0.5000 mg | ORAL_TABLET | Freq: Every day | ORAL | Status: DC
  Administered 2016-05-30 – 2016-05-31 (×2): 0.5 mg via ORAL
  Filled 2016-05-30 (×2): qty 1

## 2016-05-30 MED ORDER — TRAZODONE HCL 50 MG PO TABS
50.0000 mg | ORAL_TABLET | Freq: Every day | ORAL | Status: DC
  Administered 2016-05-30 – 2016-05-31 (×2): 50 mg via ORAL
  Filled 2016-05-30 (×2): qty 1

## 2016-05-30 MED ORDER — INSULIN DETEMIR 100 UNIT/ML ~~LOC~~ SOLN
30.0000 [IU] | Freq: Every day | SUBCUTANEOUS | Status: DC
  Administered 2016-05-30 – 2016-06-01 (×3): 30 [IU] via SUBCUTANEOUS
  Filled 2016-05-30 (×7): qty 0.3

## 2016-05-30 MED ORDER — ATORVASTATIN CALCIUM 40 MG PO TABS
80.0000 mg | ORAL_TABLET | Freq: Every day | ORAL | Status: DC
  Administered 2016-05-30 – 2016-06-01 (×3): 80 mg via ORAL
  Filled 2016-05-30 (×3): qty 2

## 2016-05-30 MED ORDER — ENOXAPARIN SODIUM 40 MG/0.4ML ~~LOC~~ SOLN
40.0000 mg | SUBCUTANEOUS | Status: DC
  Administered 2016-05-30 – 2016-06-01 (×3): 40 mg via SUBCUTANEOUS
  Filled 2016-05-30 (×3): qty 0.4

## 2016-05-30 MED ORDER — PANTOPRAZOLE SODIUM 40 MG PO TBEC
40.0000 mg | DELAYED_RELEASE_TABLET | Freq: Two times a day (BID) | ORAL | Status: DC
  Administered 2016-05-30 – 2016-06-01 (×5): 40 mg via ORAL
  Filled 2016-05-30 (×5): qty 1

## 2016-05-30 MED ORDER — CLOZAPINE 100 MG PO TABS
150.0000 mg | ORAL_TABLET | Freq: Every day | ORAL | Status: DC
  Administered 2016-05-30: 150 mg via ORAL
  Filled 2016-05-30 (×3): qty 2

## 2016-05-30 MED ORDER — ARIPIPRAZOLE 10 MG PO TABS
5.0000 mg | ORAL_TABLET | Freq: Every day | ORAL | Status: DC
  Administered 2016-05-30 – 2016-06-01 (×3): 5 mg via ORAL
  Filled 2016-05-30 (×3): qty 1

## 2016-05-30 MED ORDER — ASPIRIN 81 MG PO CHEW
324.0000 mg | CHEWABLE_TABLET | Freq: Once | ORAL | Status: AC
Start: 2016-05-30 — End: 2016-05-30
  Administered 2016-05-30: 324 mg via ORAL
  Filled 2016-05-30: qty 4

## 2016-05-30 MED ORDER — ONDANSETRON HCL 4 MG/2ML IJ SOLN: 4.0000 mg | Freq: Four times a day (QID) | INTRAMUSCULAR | Status: DC | PRN

## 2016-05-30 MED ORDER — DULOXETINE HCL 60 MG PO CPEP
60.0000 mg | ORAL_CAPSULE | Freq: Every day | ORAL | Status: DC
  Administered 2016-05-30 – 2016-06-01 (×3): 60 mg via ORAL
  Filled 2016-05-30 (×3): qty 1

## 2016-05-30 MED ORDER — LATANOPROST 0.005 % OP SOLN
1.0000 [drp] | Freq: Every day | OPHTHALMIC | Status: DC
  Administered 2016-05-30 – 2016-05-31 (×2): 1 [drp] via OPHTHALMIC
  Filled 2016-05-30: qty 2.5

## 2016-05-30 MED ORDER — POTASSIUM CHLORIDE IN NACL 20-0.9 MEQ/L-% IV SOLN
INTRAVENOUS | Status: DC
  Administered 2016-05-30 – 2016-05-31 (×3): via INTRAVENOUS

## 2016-05-30 MED ORDER — AMLODIPINE BESYLATE 5 MG PO TABS
5.0000 mg | ORAL_TABLET | Freq: Every day | ORAL | Status: DC
  Administered 2016-05-30 – 2016-06-01 (×3): 5 mg via ORAL
  Filled 2016-05-30 (×3): qty 1

## 2016-05-30 MED ORDER — DEXTROSE 5 % IV SOLN
1.0000 g | Freq: Once | INTRAVENOUS | Status: AC
  Administered 2016-05-30: 1 g via INTRAVENOUS
  Filled 2016-05-30: qty 10

## 2016-05-30 MED ORDER — SACCHAROMYCES BOULARDII 250 MG PO CAPS
250.0000 mg | ORAL_CAPSULE | Freq: Two times a day (BID) | ORAL | Status: DC
  Administered 2016-05-30 – 2016-06-01 (×5): 250 mg via ORAL
  Filled 2016-05-30 (×5): qty 1

## 2016-05-30 MED ORDER — CLOZAPINE 100 MG PO TABS
500.0000 mg | ORAL_TABLET | Freq: Every day | ORAL | Status: DC
  Administered 2016-05-30 – 2016-05-31 (×2): 500 mg via ORAL
  Filled 2016-05-30 (×5): qty 5

## 2016-05-30 MED ORDER — INSULIN ASPART 100 UNIT/ML ~~LOC~~ SOLN
5.0000 [IU] | Freq: Three times a day (TID) | SUBCUTANEOUS | Status: DC
Start: 2016-05-30 — End: 2016-05-31
  Administered 2016-05-30 – 2016-05-31 (×3): 5 [IU] via SUBCUTANEOUS

## 2016-05-30 MED ORDER — CLOZAPINE 25 MG PO TABS
150.0000 mg | ORAL_TABLET | Freq: Every day | ORAL | Status: DC
  Administered 2016-05-31 – 2016-06-01 (×2): 150 mg via ORAL
  Filled 2016-05-30 (×6): qty 2

## 2016-05-30 MED ORDER — CLOZAPINE 100 MG PO TABS
500.0000 mg | ORAL_TABLET | Freq: Every day | ORAL | Status: DC
  Filled 2016-05-30 (×2): qty 5

## 2016-05-30 MED ORDER — TICAGRELOR 90 MG PO TABS
90.0000 mg | ORAL_TABLET | Freq: Two times a day (BID) | ORAL | Status: DC
  Administered 2016-05-30 – 2016-06-01 (×5): 90 mg via ORAL
  Filled 2016-05-30 (×12): qty 1

## 2016-05-30 MED ORDER — DOCUSATE SODIUM 100 MG PO CAPS
100.0000 mg | ORAL_CAPSULE | Freq: Every day | ORAL | Status: DC
  Administered 2016-05-30 – 2016-06-01 (×3): 100 mg via ORAL
  Filled 2016-05-30 (×3): qty 1

## 2016-05-30 MED ORDER — GEMFIBROZIL 600 MG PO TABS
600.0000 mg | ORAL_TABLET | Freq: Two times a day (BID) | ORAL | Status: DC
  Administered 2016-05-30 – 2016-05-31 (×2): 600 mg via ORAL
  Filled 2016-05-30 (×2): qty 1

## 2016-05-30 MED ORDER — INSULIN ASPART 100 UNIT/ML ~~LOC~~ SOLN: 0.0000 [IU] | Freq: Every day | SUBCUTANEOUS | Status: DC

## 2016-05-30 MED ORDER — METOPROLOL TARTRATE 50 MG PO TABS
50.0000 mg | ORAL_TABLET | Freq: Two times a day (BID) | ORAL | Status: DC
  Administered 2016-05-30 – 2016-06-01 (×5): 50 mg via ORAL
  Filled 2016-05-30 (×5): qty 1

## 2016-05-30 MED ORDER — ASPIRIN EC 81 MG PO TBEC
81.0000 mg | DELAYED_RELEASE_TABLET | Freq: Every day | ORAL | Status: DC
  Administered 2016-05-30 – 2016-06-01 (×3): 81 mg via ORAL
  Filled 2016-05-30 (×3): qty 1

## 2016-05-30 MED ORDER — BENZTROPINE MESYLATE 1 MG PO TABS
1.0000 mg | ORAL_TABLET | Freq: Every day | ORAL | Status: DC
  Administered 2016-05-30 – 2016-05-31 (×2): 1 mg via ORAL
  Filled 2016-05-30 (×2): qty 1

## 2016-05-30 MED ORDER — PNEUMOCOCCAL VAC POLYVALENT 25 MCG/0.5ML IJ INJ
0.5000 mL | INJECTION | INTRAMUSCULAR | Status: AC
  Administered 2016-05-31: 0.5 mL via INTRAMUSCULAR
  Filled 2016-05-30: qty 0.5

## 2016-05-30 NOTE — ED Notes (Signed)
Ambulated well with Vadhir Mcnay, stand by assist.

## 2016-05-30 NOTE — Consult Note (Signed)
Cardiology Consultation Note    Patient ID: Oscar Elliott, MRN: [id number], DOB/AGE: December 16, 1960 56 y.o. Admit date: 05/30/2016   Date of Consult: 05/30/2016 Primary Physician: Anselm Jungling, NP Primary Cardiologist: Diona Browner  Chief Complaint: "I don't remember" Reason for Consultation: elevated troponin Requesting MD: Dr. Kerry Hough  HPI: Oscar Elliott is a 56 y.o. male who is being seen today for the evaluation of elevated troponin at the request of Dr. Kerry Hough. The patient has a history of CAD (lateral STEMI s/p DES to RCA 10/2013), HTN, HLD, DM, PVD s/p L AKA, cognitive deficit, schizophrena, renal failure 2009 in setting of rhabdo who was admitted with hypoglycemia. Last cath was at time of MI in 10/2013 with the occluded RCA treated with DES, also showed a trivial diagonal branch that was occluded treated medically. Last echo 10/2013: HK of basal inferior, inferolateraland inferoseptal walls, EF 50-55%, normal diastolic parameters, mild circumferential pericardial effusion, mildly reduced RV function. He had a chronically elevated troponin back in the 0.2 range in 01/2014 when admitted for C. Diff colitis.  The patient at baseline has schizophrenia and also seems to have some cognitive impairment which was previously mentioned as well. He was brought in from nursing home. He was found to have slurred speech, foaming at mouth, and arm numbness bilaterally.No seizure activity. He was found to be hypoglycemic with CBG of 60. He had had poor PO intake that day and had received scheduled insulin. He also has been having some suprapubic tenderness and dysuria. He was admitted for treatment of hypoglycemia and UTI. 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 Bloodwork otherwise notable for troponin 0.91-0.83, plt 125. He reports a several year history of chest wall tenderness to palpation diffusely but no acute change recently. This is different than  his prior anginal symptoms and even pre-dates that. He's not had any exertional-type angina. Denies dyspnea or LEE. Currently feels well. He doesn't recall the events that led him up to admission.  Past Medical History:  Diagnosis Date  . CAD (coronary artery disease)    a. 10/2013 Lateral STEMI/PCI: LM nl, LAD min irregs, D1 occluded sub branch, LCX min irregs mid, OM large/patent, RCA 126m (2.75x28 Promus DES).  . Essential hypertension   . History of renal failure    a. 06/2007: in setting of rhabdo post L AKA.  Marland Kitchen Hyperlipidemia   . MI (myocardial infarction)   . Peripheral vascular disease (HCC)    a. 06/2007: s/p L AKA @ [facility]  . Schizophrenia (HCC)   . Type 2 diabetes mellitus [facility])       Surgical History:  Past Surgical History:  Procedure Laterality Date  . Above-the-knee amputation Left   . LEFT HEART CATHETERIZATION WITH CORONARY ANGIOGRAM N/A 11/20/2013   Procedure: LEFT HEART CATHETERIZATION WITH CORONARY ANGIOGRAM;  Surgeon: Corky Crafts, MD;  Location: [facility] CATH LAB;  Service: Cardiovascular;  Laterality: N/A;     Home Meds: Prior to Admission medications   Medication Sig Start Date End Date Taking? Authorizing Provider  amLODipine (NORVASC) 5 MG tablet Take 5 mg by mouth daily.    Historical Provider, MD  ARIPiprazole (ABILIFY) 5 MG tablet Take 5 mg by mouth daily.    Historical Provider, MD  aspirin EC 81 MG tablet Take 81 mg by mouth daily.    Historical Provider, MD  atorvastatin (LIPITOR) 80 MG tablet TAKE ONE TABLET BY MOUTH ONCE DAILY AT 6PM. 11/19/14   Bettey Mare  Lawrence, NP  benztropine (COGENTIN) 1 MG tablet Take 1 mg by mouth at bedtime.    Historical Provider, MD  BRILINTA 90 MG TABS tablet TAKE (1) TABLET BY MOUTH TWICE DAILY. 02/14/16   Jodelle Gross, NP  clonazePAM (KLONOPIN) 0.5 MG tablet Take 0.5 mg by mouth at bedtime.    Historical Provider, MD  cloZAPine (CLOZARIL) 100 MG tablet Take 150-500 mg by mouth 2 (two) times daily. Take  by mouth  in AM and  by mouth in PM    Historical Provider, MD  docusate sodium (COLACE) 100 MG capsule Take 100 mg by mouth daily.    Historical Provider, MD  DULoxetine (CYMBALTA) 60 MG capsule Take 60 mg by mouth daily.    Historical Provider, MD  gemfibrozil (LOPID) 600 MG tablet Take 600 mg by mouth 2 (two) times daily before a meal.    Historical Provider, MD  hydrocortisone (ANUSOL-HC) 2.5 % rectal cream Place 1 application rectally 2 (two) times daily. 04/21/14   Anice Paganini, NP  insulin aspart (NOVOLOG) 100 UNIT/ML injection Inject 10 Units into the skin 3 (three) times daily before meals.    Historical Provider, MD  insulin detemir (LEVEMIR) 100 UNIT/ML injection 30 units of Levemir At lunchtime x 1 week, then increase back to 35 units daily 02/26/14   Hollice Espy, MD  latanoprost (XALATAN) 0.005 % ophthalmic solution Place 1 drop into both eyes at bedtime.    Historical Provider, MD  metoprolol (LOPRESSOR) 50 MG tablet Take 1 tablet (50 mg total) by mouth 2 (two) times daily. 12/02/13   Dyann Kief, PA-C  nitroGLYCERIN (NITROSTAT) 0.4 MG SL tablet Place 1 tablet (0.4 mg total) under the tongue every 5 (five) minutes x 3 doses as needed for chest pain. 11/23/13   Rhonda G Barrett, PA-C  omeprazole (PRILOSEC) 40 MG capsule Take 40 mg by mouth 2 (two) times daily.     Historical Provider, MD  saccharomyces boulardii (FLORASTOR) 250 MG capsule Take 1 capsule (250 mg total) by mouth 2 (two) times daily. 02/26/14   Hollice Espy, MD  tamsulosin (FLOMAX) 0.4 MG CAPS capsule Take 0.4 mg by mouth daily.     Historical Provider, MD  traZODone (DESYREL) 50 MG tablet Take 50 mg by mouth at bedtime.    Historical Provider, MD    Inpatient Medications:  . amLODipine  5 mg Oral Daily  . ARIPiprazole  5 mg Oral Daily  . aspirin EC  81 mg Oral Daily  . atorvastatin  80 mg Oral q1800  . benztropine  1 mg Oral QHS  . clonazePAM  0.5 mg Oral QHS  . cloZAPine  150 mg Oral Daily  . cloZAPine  500 mg  Oral QHS  . docusate sodium  100 mg Oral Daily  . DULoxetine  60 mg Oral Daily  . enoxaparin (LOVENOX) injection  40 mg Subcutaneous Q24H  . gemfibrozil  600 mg Oral BID AC  . insulin aspart  0-5 Units Subcutaneous QHS  . insulin aspart  0-9 Units Subcutaneous TID WC  . insulin aspart  5 Units Subcutaneous TID AC  . insulin detemir  30 Units Subcutaneous Daily  . latanoprost  1 drop Both Eyes QHS  . metoprolol  50 mg Oral BID  . pantoprazole  40 mg Oral BID  . saccharomyces boulardii  250 mg Oral BID  . tamsulosin  0.4 mg Oral Daily  . ticagrelor  90 mg Oral BID  . traZODone  50  mg Oral QHS   . 0.9 % NaCl with KCl 20 mEq / L      Allergies:  Allergies  Allergen Reactions  . Penicillins Other (See Comments)    Reaction unknown    Social History   Social History  . Marital status: Single    Spouse name: N/A  . Number of children: N/A  . Years of education: N/A   Occupational History  . Not on file.   Social History Main Topics  . Smoking status: Current Every Day Smoker    Packs/day: 0.25    Years: 20.00    Types: Cigars, Cigarettes  . Smokeless tobacco: Never Used  . Alcohol use No  . Drug use: No  . Sexual activity: No   Other Topics Concern  . Not on file   Social History Narrative  . No narrative on file     Family History  Problem Relation Age of Onset  . Other      no premature CAD.     Review of Systems All other systems reviewed and are otherwise negative except as noted above.  Labs:  Recent Labs  05/30/16 0059 05/30/16 0515  TROPONINI 0.91* 0.83*   Lab Results  Component Value Date   WBC 7.3 05/30/2016   HGB 13.6 05/30/2016   HCT 40.2 05/30/2016   MCV 87.8 05/30/2016   PLT 125 (L) 05/30/2016    Recent Labs Lab 05/30/16 0059  NA 139  K 3.7  CL 107  CO2 24  BUN 17  CREATININE 1.15  CALCIUM 8.9  PROT 6.7  BILITOT 0.6  ALKPHOS 80  ALT 14*  AST 17  GLUCOSE 183*   Lab Results  Component Value Date   CHOL 102  11/21/2013   HDL 27 (L) 11/21/2013   LDLCALC 62 11/21/2013   TRIG 66 11/21/2013   No results found for: DDIMER  Radiology/Studies:  Dg Chest 2 View  Result Date: 05/30/2016 CLINICAL DATA:  Slurred speech and upper extremity paresthesias tonight. EXAM: CHEST  2 VIEW COMPARISON:  02/22/2014 FINDINGS: The lungs are clear. The pulmonary vasculature is normal. Heart size is normal. Hilar and mediastinal contours are unremarkable. There is no pleural effusion. IMPRESSION: No active cardiopulmonary disease. Electronically Signed   By: Ellery Plunk M.D.   On: 05/30/2016 02:59   Ct Head Wo Contrast  Result Date: 05/30/2016 CLINICAL DATA:  56 y/o  M; headaches and altered mental status. EXAM: CT HEAD WITHOUT CONTRAST TECHNIQUE: Contiguous axial images were obtained from the base of the skull through the vertex without intravenous contrast. COMPARISON:  None. FINDINGS: Brain: No evidence of acute infarction, hemorrhage, hydrocephalus, extra-axial collection or mass lesion/mass effect. 7 mm of cerebellar tonsillar ectopia. Vascular: No hyperdense vessel or unexpected calcification. Skull: Normal. Negative for fracture or focal lesion. Sinuses/Orbits: No acute finding. Other: None. IMPRESSION: 1. No acute intracranial abnormality. 2. 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 No hydrocephalus. 3. Otherwise unremarkable CT of the head. Electronically Signed   By: Mitzi Hansen M.D.   On: 05/30/2016 02:38    Wt Readings from Last 3 Encounters:  05/30/16 175 lb (79.4 kg)  10/25/15 144 lb (65.3 kg)  10/25/14 146 lb (66.2 kg)    EKG: NSR 82bpm no acute ST-T changes    Physical Exam: Blood pressure 114/84, pulse 87, temperature 97.3 F (36.3 C), temperature source Oral, resp. rate (!) 26, height  (1.676 m), weight 175 lb (79.4 kg), SpO2 99 %.  Body mass index is 28.25 kg/m. General: Well developed, well nourished, in no acute distress. Head: Frontal  prominence of skull noted, atraumatic, sclera non-icteric, no xanthomas, nares are without discharge.  Neck: Negative for carotid bruits. JVD not elevated. Lungs: Clear bilaterally to auscultation without wheezes, rales, or rhonchi. Breathing is unlabored. Heart: RRR with S1 S2. No murmurs, rubs, or gallops appreciated. Abdomen: Soft, non-tender, non-distended with normoactive bowel sounds. No hepatomegaly. No rebound/guarding. No obvious abdominal masses. Msk:  Strength and tone appear normal for age. Extremities: No clubbing or cyanosis. No edema.  Distal pedal pulses are 2+ and equal bilaterally. Neuro: Alert and oriented to year, self, place, does not know month. No facial asymmetry. Mild cognitive deficit noted Psych:  Responds to questions appropriately with a normal affect.     Assessment and Plan  56M with CAD (lateral STEMI s/p DES to RCA 10/2013), HTN, HLD, DM, PVD s/p L AKA, cognitive deficit, schizophrenia who was admitted with altered mental status, found to have hypoglycemia, possible UTI and elevated troponin.  1. AMS - suspect mental status changes are secondary to hypoglycemia in the setting of insulin use and poor PO intake. This has improved. CT head negative for stroke but did show possible Chiari 1 malformation - per primary team. I will defer whether he needs MRI to definitively exclude CVA to IM.  2. Elevated troponin with known h/o CAD - somewhat higher than expected for demand ischemia, but he hasn't really had any recent symptoms suspicious for angina. Suspect this is a secondary elevation. Prior levels were in the 0.2 range in 01/2014 in the setting of acute illness. He describes a very atypical chest wall soreness present for many years without acute change recently. Await echocardiogram. Will review further workup with MD. It appears the patient's been continued on DAPT beyond the 1 year window for his stenting; this can be discussed with primary cardiologist as OP.  Continue BB, statin.  3. HTN - controlled.  Signed, Laurann Montana PA-C 05/30/2016, 10:23 AM Pager: [phone]  The patient was seen and examined, and I agree with the history, physical exam, assessment and plan as documented above, with modifications as noted below. 56 yr old male with aforementioned presentation and h/o CAD admitted with hypoglycemia and troponin elevation, currently trending down. Denies typical anginal symptoms. Had chest wall tenderness earlier when evaluated by APP, none now. ECG is normal. Echocardiogram is pending. I do not feel invasive ischemic evaluation is warranted, as this is likely demand in nature and may be related to trivial diagonal branch occlusion. Continue ASA, statin, and beta blocker. Will defer to Dr. Diona Browner regarding continuation of Brilinta. Although, being this far out from stenting, this should probably be discontinued. Dose should at least be reduced to 60 mg bid regardless. No further recommendations. Will sign off.  Prentice Docker, MD, [facility]  05/30/2016 11:47 AM

## 2016-05-30 NOTE — ED Notes (Signed)
Pt given ice cream

## 2016-05-30 NOTE — ED Notes (Signed)
CRITICAL VALUE ALERT  Critical value received:  Troponin 0.91  Date of notification:  05/30/16  Time of notification:  0428 hrs  Critical value read back:Yes.    Nurse who received alert:  Y. Brenn Gatton, RN  Responding MD:  Dr. Manus Gunning  Time MD responded:  0428 hrs

## 2016-05-30 NOTE — Care Management Obs Status (Signed)
MEDICARE OBSERVATION STATUS NOTIFICATION   Patient Details  Name: Oscar Elliott MRN: [id number] Date of Birth: 03-Jun-1960   Medicare Observation Status Notification Given:  Yes    Kerim Statzer, Chrystine Oiler, RN 05/30/2016, 1:29 PM

## 2016-05-30 NOTE — ED Notes (Signed)
Patient was given diet ginger ale for po fluid challenge, tolerated well.

## 2016-05-30 NOTE — H&P (Signed)
History and Physical    Danish Ruffins ZOX:[id number] DOB: 1960-11-19 DOA: 05/30/2016  PCP: Anselm Jungling, NP  Patient coming from: group home  I have personally briefly reviewed patient's old medical records in [facility]  Chief Complaint: hypoglycemia  HPI: Oscar Elliott is a 56 y.o. male with medical history significant of  HTN, DM on insulin, CAD s/p stent in 2015, was brought to ED with altered mental status and hypoglycemia. Patient had reportedly taken his insulin yesterday and had poor po intake. He has schizophrenia at baseline and is not an optimal historian. He was brought to ED with headache, inability to speak, slurring of speech and arm numbness bilaterally. CBG was noted to be 60. He received orange juice with some improvement in blood sugar. He denies any vomiting, but does report some constipation. He says he has been having dysuria for about a month now. He does describe chest soreness that occurred the day before yesterday while he was dusting his room. This was nonradiating, with no associated nausea, diaphoresis. He does report associated shortness of breath. No cough or fever recently.  ED Course: vitals noted to be stable. cbgs were improving. ekg unremarkable. Troponin noted to be elevated at 0.9. He has not had further chest pain. He has been referred for admission.  Review of Systems: As per HPI otherwise 10 point review of systems negative.    Past Medical History:  Diagnosis Date  . CAD (coronary artery disease)    a. 10/2013 Lateral STEMI/PCI: LM nl, LAD min irregs, D1 occluded sub branch, LCX min irregs mid, OM large/patent, RCA 122m (2.75x28 Promus DES).  . Essential hypertension   . History of renal failure    a. 06/2007: in setting of rhabdo post L AKA.  Marland Kitchen Hyperlipidemia   . MI (myocardial infarction)   . Peripheral vascular disease (HCC)    a. 06/2007: s/p L AKA @ [facility]  . Schizophrenia (HCC)   . Type 2 diabetes mellitus (HCC)     Past Surgical  History:  Procedure Laterality Date  . Above-the-knee amputation Left   . LEFT HEART CATHETERIZATION WITH CORONARY ANGIOGRAM N/A 11/20/2013   Procedure: LEFT HEART CATHETERIZATION WITH CORONARY ANGIOGRAM;  Surgeon: Corky Crafts, MD;  Location: [facility] CATH LAB;  Service: Cardiovascular;  Laterality: N/A;     reports that he has been smoking Cigars and Cigarettes.  He has a 5.00 pack-year smoking history. He has never used smokeless tobacco. He reports that he does not drink alcohol or use drugs.  Allergies  Allergen Reactions  . Penicillins Other (See Comments)    Reaction unknown    Family History  Problem Relation Age of Onset  . Other      no premature CAD.    Prior to Admission medications   Medication Sig Start Date End Date Taking? Authorizing Provider  amLODipine (NORVASC) 5 MG tablet Take 5 mg by mouth daily.    Historical Provider, MD  ARIPiprazole (ABILIFY) 5 MG tablet Take 5 mg by mouth daily.    Historical Provider, MD  aspirin EC 81 MG tablet Take 81 mg by mouth daily.    Historical Provider, MD  atorvastatin (LIPITOR) 80 MG tablet TAKE ONE TABLET BY MOUTH ONCE DAILY AT 6PM. 11/19/14   Jodelle Gross, NP  benztropine (COGENTIN) 1 MG tablet Take 1 mg by mouth at bedtime.    Historical Provider, MD  BRILINTA 90 MG TABS tablet TAKE (1) TABLET BY MOUTH TWICE DAILY. 02/14/16   Samara Deist  Dayton Scrape, NP  clonazePAM (KLONOPIN) 0.5 MG tablet Take 0.5 mg by mouth at bedtime.    Historical Provider, MD  cloZAPine (CLOZARIL) 100 MG tablet Take 150-500 mg by mouth 2 (two) times daily. Take  by mouth in AM and  by mouth in PM    Historical Provider, MD  docusate sodium (COLACE) 100 MG capsule Take 100 mg by mouth daily.    Historical Provider, MD  DULoxetine (CYMBALTA) 60 MG capsule Take 60 mg by mouth daily.    Historical Provider, MD  gemfibrozil (LOPID) 600 MG tablet Take 600 mg by mouth 2 (two) times daily before a meal.    Historical Provider, MD  hydrocortisone  (ANUSOL-HC) 2.5 % rectal cream Place 1 application rectally 2 (two) times daily. 04/21/14   Anice Paganini, NP  insulin aspart (NOVOLOG) 100 UNIT/ML injection Inject 10 Units into the skin 3 (three) times daily before meals.    Historical Provider, MD  insulin detemir (LEVEMIR) 100 UNIT/ML injection 30 units of Levemir At lunchtime x 1 week, then increase back to 35 units daily 02/26/14   Hollice Espy, MD  latanoprost (XALATAN) 0.005 % ophthalmic solution Place 1 drop into both eyes at bedtime.    Historical Provider, MD  metoprolol (LOPRESSOR) 50 MG tablet Take 1 tablet (50 mg total) by mouth 2 (two) times daily. 12/02/13   Dyann Kief, PA-C  nitroGLYCERIN (NITROSTAT) 0.4 MG SL tablet Place 1 tablet (0.4 mg total) under the tongue every 5 (five) minutes x 3 doses as needed for chest pain. 11/23/13   Rhonda G Barrett, PA-C  omeprazole (PRILOSEC) 40 MG capsule Take 40 mg by mouth 2 (two) times daily.     Historical Provider, MD  saccharomyces boulardii (FLORASTOR) 250 MG capsule Take 1 capsule (250 mg total) by mouth 2 (two) times daily. 02/26/14   Hollice Espy, MD  tamsulosin (FLOMAX) 0.4 MG CAPS capsule Take 0.4 mg by mouth daily.     Historical Provider, MD  traZODone (DESYREL) 50 MG tablet Take 50 mg by mouth at bedtime.    Historical Provider, MD    Physical Exam: Vitals:   05/30/16 0530 05/30/16 0600 05/30/16 0630 05/30/16 0718  BP: 109/80 117/84 114/84   Pulse: 82 82 87   Resp: (!) 22 (!) 25 (!) 26   Temp:      TempSrc:      SpO2: 100% 100% 100% 99%  Weight:      Height:        Constitutional: NAD, calm, comfortable Vitals:   05/30/16 0530 05/30/16 0600 05/30/16 0630 05/30/16 0718  BP: 109/80 117/84 114/84   Pulse: 82 82 87   Resp: (!) 22 (!) 25 (!) 26   Temp:      TempSrc:      SpO2: 100% 100% 100% 99%  Weight:      Height:       Eyes: PERRL, lids and conjunctivae normal ENMT: Mucous membranes are moist. Posterior pharynx clear of any exudate or lesions.Normal  dentition.  Neck: normal, supple, no masses, no thyromegaly Respiratory: clear to auscultation bilaterally, no wheezing, no crackles. Normal respiratory effort. No accessory muscle use.  Cardiovascular: Regular rate and rhythm, no murmurs / rubs / gallops. No extremity edema. 2+ pedal pulses. No carotid bruits.  Abdomen: no tenderness, no masses palpated. No hepatosplenomegaly. Bowel sounds positive.  Musculoskeletal: no clubbing / cyanosis. Left leg amputation. Good ROM, no contractures. Normal muscle tone.  Skin: no rashes, lesions, ulcers.  No induration Neurologic: CN 2-12 grossly intact. Sensation intact, DTR normal. Strength 5/5 in all 4.  Psychiatric: alert and oriented, pleasant.    Labs on Admission: I have personally reviewed following labs and imaging studies  CBC:  Recent Labs Lab 05/30/16 0059  WBC 7.3  NEUTROABS 6.2  HGB 13.6  HCT 40.2  MCV 87.8  PLT 125*   Basic Metabolic Panel:  Recent Labs Lab 05/30/16 0059  NA 139  K 3.7  CL 107  CO2 24  GLUCOSE 183*  BUN 17  CREATININE 1.15  CALCIUM 8.9   GFR: Estimated Creatinine Clearance: 71 mL/min (by C-G formula based on SCr of 1.15 mg/dL). Liver Function Tests:  Recent Labs Lab 05/30/16 0059  AST 17  ALT 14*  ALKPHOS 80  BILITOT 0.6  PROT 6.7  ALBUMIN 3.7   No results for input(s): LIPASE, AMYLASE in the last 168 hours. No results for input(s): AMMONIA in the last 168 hours. Coagulation Profile: No results for input(s): INR, PROTIME in the last 168 hours. Cardiac Enzymes:  Recent Labs Lab 05/30/16 0059 05/30/16 0515  TROPONINI 0.91* 0.83*   BNP (last 3 results) No results for input(s): PROBNP in the last 8760 hours. HbA1C: No results for input(s): HGBA1C in the last 72 hours. CBG:  Recent Labs Lab 05/30/16 0101 05/30/16 0303 05/30/16 0454  GLUCAP 199* 247* 172*   Lipid Profile: No results for input(s): CHOL, HDL, LDLCALC, TRIG, CHOLHDL, LDLDIRECT in the last 72 hours. Thyroid  Function Tests: No results for input(s): TSH, T4TOTAL, FREET4, T3FREE, THYROIDAB in the last 72 hours. Anemia Panel: No results for input(s): VITAMINB12, FOLATE, FERRITIN, TIBC, IRON, RETICCTPCT in the last 72 hours. Urine analysis:    Component Value Date/Time   COLORURINE YELLOW 05/30/2016 0052   APPEARANCEUR HAZY (A) 05/30/2016 0052   APPEARANCEUR Clear 07/08/2013 1606   LABSPEC 1.009 05/30/2016 0052   LABSPEC 1.010 07/08/2013 1606   PHURINE 5.0 05/30/2016 0052   GLUCOSEU NEGATIVE 05/30/2016 0052   GLUCOSEU Negative 07/08/2013 1606   HGBUR NEGATIVE 05/30/2016 0052   BILIRUBINUR NEGATIVE 05/30/2016 0052   BILIRUBINUR Negative 07/08/2013 1606   KETONESUR NEGATIVE 05/30/2016 0052   PROTEINUR NEGATIVE 05/30/2016 0052   UROBILINOGEN 0.2 02/22/2014 1853   NITRITE NEGATIVE 05/30/2016 0052   LEUKOCYTESUR LARGE (A) 05/30/2016 0052   LEUKOCYTESUR 3+ 07/08/2013 1606    Radiological Exams on Admission: Dg Chest 2 View  Result Date: 05/30/2016 CLINICAL DATA:  Slurred speech and upper extremity paresthesias tonight. EXAM: CHEST  2 VIEW COMPARISON:  02/22/2014 FINDINGS: The lungs are clear. The pulmonary vasculature is normal. Heart size is normal. Hilar and mediastinal contours are unremarkable. There is no pleural effusion. IMPRESSION: No active cardiopulmonary disease. Electronically Signed   By: Ellery Plunk M.D.   On: 05/30/2016 02:59   Ct Head Wo Contrast  Result Date: 05/30/2016 CLINICAL DATA:  57 y/o  M; headaches and altered mental status. EXAM: CT HEAD WITHOUT CONTRAST TECHNIQUE: Contiguous axial images were obtained from the base of the skull through the vertex without intravenous contrast. COMPARISON:  None. FINDINGS: Brain: No evidence of acute infarction, hemorrhage, hydrocephalus, extra-axial collection or mass lesion/mass effect. 7 mm of cerebellar tonsillar ectopia. Vascular: No hyperdense vessel or unexpected calcification. Skull: Normal. Negative for fracture or focal  lesion. Sinuses/Orbits: No acute finding. Other: None. IMPRESSION: 1. No acute intracranial abnormality. 2. 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 No hydrocephalus. 3. Otherwise unremarkable CT of the head. Electronically Signed  By: Mitzi Hansen M.D.   On: 05/30/2016 02:38    EKG: Independently reviewed. Sinus rhythm without acute changes  Assessment/Plan Active Problems:   Tobacco abuse   Essential hypertension   CAD -S/P RCA DES Sept 2015   Hyperlipidemia   Elevated troponin   Type 2 diabetes mellitus with circulatory disorder (HCC)   Hypoglycemia    1. Elevated troponin. Unclear etiology. He does not have a convincing history of chest pain. Will continue to cycle cardiac markers, although they appear to be trending down. ekg is non acute. Will check echo. Cardiology consultation.  2. CAD s/p stent in 2015. Continue on aspirin and brilinta. He is not having chest pain at present  3. Diabetes with hypoglycemia. Changes in mental status likely related to hypoglycemia. Blood sugars improving. Continue to monitor. Continue on lantus and reduced dose of novolog. Follow blood sugars. Check A1c.  4. HTN. Continue home medications. Blood pressures currently stable  5. HLD. Continue statin  6. Schizophrenia. Continue home dose of psychotropics. Seems to have some cognitive deficits at baseline  7. Tobacco abuse. Counseled on the importance of tobacco cessation  DVT prophylaxis: lovenox Code Status: full Family Communication: no family present Disposition Plan: discharge back to group home once improved Consults called: cardiology Admission status: observation, tele   Treyven Lafauci MD [facility] Pager [phone]  If 7PM-7AM, please contact night-coverage [email or web address] Password [email or web address]  05/30/2016, 10:11 AM

## 2016-05-30 NOTE — ED Triage Notes (Signed)
Pt brought in by [facility] for complaints of hypoglycemia; staff reports they found pt with altered loc and cbg of 60, pt was given OJ and 31G of oral glucose and cbg increased to 116

## 2016-05-30 NOTE — ED Provider Notes (Signed)
[facility]-EMERGENCY DEPT Provider Note   CSN: [id number] Arrival date & time: 05/30/16  4098   By signing my name below, I, Bobbie Stack, attest that this documentation has been prepared under the direction and in the presence of Glynn Octave, MD. Electronically Signed: Bobbie Stack, Scribe. 05/30/16. 1:17 AM. History   Chief Complaint Chief Complaint  Patient presents with  . Hypoglycemia   LEVEL 5 CAVEAT: Hypoglycemia The history is provided by the patient. No language interpreter was used.  HPI Comments: [facility] is a 56 y.o. male with a hx of HTN and diabetes was brought in by ambulance, who presents to the Emergency Department with an episode of hypoglycemia that occurred prior to his arrival. Patient is from a nursing home.  Patient reports developing a headache since he arrived to the ED. He also reports some abdominal discomfort, dizziness, and diarrhea recently. The patient states that he is unsure of whether he took his insulin today. He denies falls, vomiting, fevers, cough, and chest pain recently.   [facility]. Patient was found to have slurred speech, foaming at mouth, and arm numbness bilaterally. No seizure activity.  His CBG was 60. He was given orange juice and it went up to 71. The patient was last noted to have been given 10 units of novolog at 5pm. He was given 35 units levimir at 12pm. Poor PO intake today.   Past Medical History:  Diagnosis Date  . CAD (coronary artery disease)    a. 10/2013 Lateral STEMI/PCI: LM nl, LAD min irregs, D1 occluded sub branch, LCX min irregs mid, OM large/patent, RCA 18m (2.75x28 Promus DES).  . Essential hypertension   . History of renal failure    a. 06/2007: in setting of rhabdo post L AKA.  Marland Kitchen Hyperlipidemia   . MI (myocardial infarction)   . Peripheral vascular disease (HCC)    a. 06/2007: s/p L AKA @ [facility]  . Schizophrenia (HCC)   . Type 2 diabetes mellitus [facility])     Patient Active  Problem List   Diagnosis Date Noted  . Type 2 diabetes mellitus with circulatory disorder (HCC) 10/25/2015  . Encounter for screening colonoscopy 04/21/2014  . Cholelithiases 04/21/2014  . Hemorrhoids 04/21/2014  . Elevated troponin 02/24/2014  . Nausea and vomiting 02/23/2014  . Abdominal pain   . C. difficile colitis   . Hypokalemia   . Vomiting and diarrhea 02/22/2014  . CAD -S/P RCA DES Sept 2015 12/02/2013  . Hyperlipidemia 12/02/2013  . Tobacco abuse   . Peripheral vascular disease (HCC)   . Schizophrenia (HCC)   . Essential hypertension     Past Surgical History:  Procedure Laterality Date  . Above-the-knee amputation Left   . LEFT HEART CATHETERIZATION WITH CORONARY ANGIOGRAM N/A 11/20/2013   Procedure: LEFT HEART CATHETERIZATION WITH CORONARY ANGIOGRAM;  Surgeon: Corky Crafts, MD;  Location: [facility] CATH LAB;  Service: Cardiovascular;  Laterality: N/A;       Home Medications    Prior to Admission medications   Medication Sig Start Date End Date Taking? Authorizing Provider  amLODipine (NORVASC) 5 MG tablet Take 5 mg by mouth daily.    Historical Provider, MD  ARIPiprazole (ABILIFY) 5 MG tablet Take 5 mg by mouth daily.    Historical Provider, MD  aspirin EC 81 MG tablet Take 81 mg by mouth daily.    Historical Provider, MD  atorvastatin (LIPITOR) 80 MG tablet TAKE ONE TABLET BY MOUTH ONCE DAILY AT 6PM. 11/19/14  Jodelle Gross, NP  benztropine (COGENTIN) 1 MG tablet Take 1 mg by mouth at bedtime.    Historical Provider, MD  BRILINTA 90 MG TABS tablet TAKE (1) TABLET BY MOUTH TWICE DAILY. 02/14/16   Jodelle Gross, NP  clonazePAM (KLONOPIN) 0.5 MG tablet Take 0.5 mg by mouth at bedtime.    Historical Provider, MD  cloZAPine (CLOZARIL) 100 MG tablet Take 150-500 mg by mouth 2 (two) times daily. Take  by mouth in AM and  by mouth in PM    Historical Provider, MD  docusate sodium (COLACE) 100 MG capsule Take 100 mg by mouth daily.    Historical  Provider, MD  DULoxetine (CYMBALTA) 60 MG capsule Take 60 mg by mouth daily.    Historical Provider, MD  gemfibrozil (LOPID) 600 MG tablet Take 600 mg by mouth 2 (two) times daily before a meal.    Historical Provider, MD  hydrocortisone (ANUSOL-HC) 2.5 % rectal cream Place 1 application rectally 2 (two) times daily. 04/21/14   Anice Paganini, NP  insulin aspart (NOVOLOG) 100 UNIT/ML injection Inject 10 Units into the skin 3 (three) times daily before meals.    Historical Provider, MD  insulin detemir (LEVEMIR) 100 UNIT/ML injection 30 units of Levemir At lunchtime x 1 week, then increase back to 35 units daily 02/26/14   Hollice Espy, MD  latanoprost (XALATAN) 0.005 % ophthalmic solution Place 1 drop into both eyes at bedtime.    Historical Provider, MD  metoprolol (LOPRESSOR) 50 MG tablet Take 1 tablet (50 mg total) by mouth 2 (two) times daily. 12/02/13   Dyann Kief, PA-C  nitroGLYCERIN (NITROSTAT) 0.4 MG SL tablet Place 1 tablet (0.4 mg total) under the tongue every 5 (five) minutes x 3 doses as needed for chest pain. 11/23/13   Rhonda G Barrett, PA-C  omeprazole (PRILOSEC) 40 MG capsule Take 40 mg by mouth 2 (two) times daily.     Historical Provider, MD  saccharomyces boulardii (FLORASTOR) 250 MG capsule Take 1 capsule (250 mg total) by mouth 2 (two) times daily. 02/26/14   Hollice Espy, MD  tamsulosin (FLOMAX) 0.4 MG CAPS capsule Take 0.4 mg by mouth daily.     Historical Provider, MD  traZODone (DESYREL) 50 MG tablet Take 50 mg by mouth at bedtime.    Historical Provider, MD    Family History Family History  Problem Relation Age of Onset  . Other      no premature CAD.    Social History Social History  Substance Use Topics  . Smoking status: Current Every Day Smoker    Packs/day: 0.25    Years: 20.00    Types: Cigars, Cigarettes  . Smokeless tobacco: Never Used  . Alcohol use No     Allergies   Penicillins   Review of Systems Review of Systems  Unable to perform  ROS: Other (Hypoglycemia)   Physical Exam Updated Vital Signs BP 115/81 (BP Location: Left Arm)   Pulse 81   Temp 97.3 F (36.3 C) (Oral)   Resp 20   Ht  (1.676 m)   Wt 175 lb (79.4 kg)   SpO2 100%   BMI 28.25 kg/m   Physical Exam  Constitutional: He appears well-developed and well-nourished. No distress.  HENT:  Head: Normocephalic and atraumatic.  Mouth/Throat: Oropharynx is clear and moist. No oropharyngeal exudate.  Mild dry mucous membranes.   Eyes: Conjunctivae and EOM are normal. Pupils are equal, round, and reactive to light.  Neck: Normal range of motion. Neck supple.  No meningismus.  Cardiovascular: Normal rate, regular rhythm, normal heart sounds and intact distal pulses.   No murmur heard. Pulmonary/Chest: Effort normal and breath sounds normal. No respiratory distress.  Abdominal: Soft. There is no tenderness. There is no rebound and no guarding.  Musculoskeletal: Normal range of motion. He exhibits no edema or tenderness.  Left AKA  Neurological: He is alert. No cranial nerve deficit. He exhibits normal muscle tone. Coordination normal.   5/5 strength throughout. CN 2-12 intact.Equal grip strength. Oriented to person and place.  Skin: Skin is warm.  Psychiatric: He has a normal mood and affect. His behavior is normal.  Nursing note and vitals reviewed.    ED Treatments / Results  DIAGNOSTIC STUDIES: Oxygen Saturation is 100% on RA, normal by my interpretation.    COORDINATION OF CARE: 12:48 AM Discussed treatment plan with pt at bedside and pt agreed to plan. I will check the patients labs.  Labs (all labs ordered are listed, but only abnormal results are displayed) Labs Reviewed  CBC WITH DIFFERENTIAL/PLATELET - Abnormal; Notable for the following:       Result Value   RDW 16.5 (*)    Platelets 125 (*)    All other components within normal limits  COMPREHENSIVE METABOLIC PANEL - Abnormal; Notable for the following:    Glucose, Bld 183 (*)     ALT 14 (*)    All other components within normal limits  URINALYSIS, ROUTINE W REFLEX MICROSCOPIC - Abnormal; Notable for the following:    APPearance HAZY (*)    Leukocytes, UA LARGE (*)    Bacteria, UA MANY (*)    Squamous Epithelial / LPF 0-5 (*)    All other components within normal limits  TROPONIN I - Abnormal; Notable for the following:    Troponin I 0.91 (*)    All other components within normal limits  CBG MONITORING, ED - Abnormal; Notable for the following:    Glucose-Capillary 199 (*)    All other components within normal limits  CBG MONITORING, ED - Abnormal; Notable for the following:    Glucose-Capillary 247 (*)    All other components within normal limits  URINE CULTURE  CBG MONITORING, ED    EKG  EKG Interpretation  Date/Time:  Wednesday May 30 2016 01:35:41 EDT Ventricular Rate:  82 PR Interval:    QRS Duration: 91 QT Interval:  391 QTC Calculation: 457 R Axis:   33 Text Interpretation:  Sinus rhythm Borderline low voltage, extremity leads No significant change was found Confirmed by Manus Gunning  MD, Altair Appenzeller [phone]) on 05/30/2016 2:07:48 AM       Radiology Dg Chest 2 View  Result Date: 05/30/2016 CLINICAL DATA:  Slurred speech and upper extremity paresthesias tonight. EXAM: CHEST  2 VIEW COMPARISON:  02/22/2014 FINDINGS: The lungs are clear. The pulmonary vasculature is normal. Heart size is normal. Hilar and mediastinal contours are unremarkable. There is no pleural effusion. IMPRESSION: No active cardiopulmonary disease. Electronically Signed   By: Ellery Plunk M.D.   On: 05/30/2016 02:59   Ct Head Wo Contrast  Result Date: 05/30/2016 CLINICAL DATA:  56 y/o  M; headaches and altered mental status. EXAM: CT HEAD WITHOUT CONTRAST TECHNIQUE: Contiguous axial images were obtained from the base of the skull through the vertex without intravenous contrast. COMPARISON:  None. FINDINGS: Brain: No evidence of acute infarction, hemorrhage, hydrocephalus,  extra-axial collection or mass lesion/mass effect. 7 mm of cerebellar tonsillar ectopia. Vascular:  No hyperdense vessel or unexpected calcification. Skull: Normal. Negative for fracture or focal lesion. Sinuses/Orbits: No acute finding. Other: None. IMPRESSION: 1. No acute intracranial abnormality. 2. 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 No hydrocephalus. 3. Otherwise unremarkable CT of the head. Electronically Signed   By: Mitzi Hansen M.D.   On: 05/30/2016 02:38    Procedures Procedures (including critical care time)  Medications Ordered in ED Medications - No data to display   Initial Impression / Assessment and Plan / ED Course  I have reviewed the triage vital signs and the nursing notes.  Pertinent labs & imaging results that were available during my care of the patient were reviewed by me and considered in my medical decision making (see chart for details).    Patient from living facility with change in mental status and hypoglycemia, now improved. Patient alert and oriented 3. No focal neurological deficits. Complains of needing to urinate.  CBG has been stable throughout her ED stay. Last dose of insulin was at 5 PM per facility. He is not on oral hypoglycemics.  CT head is stable. Urinalysis is questionable for infection and culture is sent. EKG is unchanged. CXR negative.  Patient tolerating by mouth and ambulatory.  Troponin elevated 0.91. EKG is nonischemic and unchanged. Patient seemed to have chronic troponin elevation is or 0.2 range. He denies any chest pain but states it is "sore".  He has a history of CAD- lateral STEMI DES to the RCA Sept 2015  D/w Dr. Orson Aloe of cardiology.  He would like to see troponin trend, but feels patient can stay at [facility].  Troponin 0.8. Patient continues to complain of some "soreness" to his chest. It appears that his troponin is down trending. With chronic troponin elevation, Dr. Orson Aloe  feels he can stay at [facility] for Hospitalist observation. Treat UTI. Blood sugar has been  Stable in the ED.   Observation d/w Dr. Conley Rolls.  CRITICAL CARE Performed by: Glynn Octave Total critical care time: 31 minutes Critical care time was exclusive of separately billable procedures and treating other patients. Critical care was necessary to treat or prevent imminent or life-threatening deterioration. Critical care was time spent personally by me on the following activities: development of treatment plan with patient and/or surrogate as well as nursing, discussions with consultants, evaluation of patient's response to treatment, examination of patient, obtaining history from patient or surrogate, ordering and performing treatments and interventions, ordering and review of laboratory studies, ordering and review of radiographic studies, pulse oximetry and re-evaluation of patient's condition.     Final Clinical Impressions(s) / ED Diagnoses   Final diagnoses:  Elevated troponin  Hypoglycemia    New Prescriptions New Prescriptions   No medications on file   I personally performed the services described in this documentation, which was scribed in my presence. The recorded information has been reviewed and is accurate.    Glynn Octave, MD 05/30/16 [phone]

## 2016-05-30 NOTE — Clinical Social Work Note (Signed)
Clinical Social Work Assessment  Patient Details  Name: Oscar Elliott MRN: [id number] Date of Birth: 1960/06/04  Date of referral:  05/30/16               Reason for consult:  Discharge Planning                Permission sought to share information with:    Permission granted to share information::     Name::        Agency::     Relationship::     Contact Information:  [facility], facility staff   Housing/Transportation Living arrangements for the past 2 months:  Assisted Living Facility Source of Information:  Patient, Facility Patient Interpreter Needed:  None Criminal Activity/Legal Involvement Pertinent to Current Situation/Hospitalization:  No - Comment as needed Significant Relationships:  None, Other(Comment) (Facility staff considers themselves his family. ) Lives with:  Facility Resident Do you feel safe going back to the place where you live?  Yes Need for family participation in patient care:  Yes (Comment)  Care giving concerns:  None identified by patient.   Social Worker assessment / plan:  Patient states that he has been at the [facility] for "years and years and year." He uses a wheelchair and reports that he is independent in ADLs.  He has no family support. He did report some conflict with another resident.  He wants to return to the facility at discharge.  O[facility], staff, at [facility], stated that patient has been at the facility since 1996. He stated that staff offers extensive assistance to patient and that he accepts it when he wants to.  O[facility] Elliott stated that patient has his own room and that the conflict between other residents is mainly in his mind.  He stated that at times patient experiences some paranoia. Oscar Elliott stated that patient can return to the facility at discharge and that facility staff considers himself patient's family.   Employment status:  Disabled (Comment on whether or not currently receiving  Disability) Insurance information:  Medicare PT Recommendations:  Not assessed at this time Information / Referral to community resources:     Patient/Family's Response to care:  Patient is agreeable to return to [facility] at discharge.  Patient/Family's Understanding of and Emotional Response to Diagnosis, Current Treatment, and Prognosis:  Patient understands his diagnosis, treatment and prognosis.   Emotional Assessment Appearance:  Appears stated age Attitude/Demeanor/Rapport:   (Cooperative) Affect (typically observed):  Accepting, Calm Orientation:  Oriented to Self, Oriented to Place, Oriented to  Time, Oriented to Situation Alcohol / Substance use:  Tobacco Use Psych involvement (Current and /or in the community):  No (Comment)  Discharge Needs  Concerns to be addressed:  Other (Comment Required (Return to [facility]) Readmission within the last 30 days:  No Current discharge risk:  None Barriers to Discharge:  No Barriers Identified   Annice Needy, LCSW 05/30/2016, 10:42 AM

## 2016-05-31 ENCOUNTER — Observation Stay (HOSPITAL_COMMUNITY): Payer: Medicare Other

## 2016-05-31 DIAGNOSIS — E1151 Type 2 diabetes mellitus with diabetic peripheral angiopathy without gangrene: Secondary | ICD-10-CM | POA: Diagnosis present

## 2016-05-31 DIAGNOSIS — Z23 Encounter for immunization: Secondary | ICD-10-CM | POA: Diagnosis present

## 2016-05-31 DIAGNOSIS — R748 Abnormal levels of other serum enzymes: Secondary | ICD-10-CM | POA: Diagnosis not present

## 2016-05-31 DIAGNOSIS — I252 Old myocardial infarction: Secondary | ICD-10-CM | POA: Diagnosis not present

## 2016-05-31 DIAGNOSIS — R079 Chest pain, unspecified: Secondary | ICD-10-CM | POA: Diagnosis not present

## 2016-05-31 DIAGNOSIS — Z794 Long term (current) use of insulin: Secondary | ICD-10-CM | POA: Diagnosis not present

## 2016-05-31 DIAGNOSIS — E785 Hyperlipidemia, unspecified: Secondary | ICD-10-CM | POA: Diagnosis present

## 2016-05-31 DIAGNOSIS — I2510 Atherosclerotic heart disease of native coronary artery without angina pectoris: Secondary | ICD-10-CM | POA: Diagnosis present

## 2016-05-31 DIAGNOSIS — Z9861 Coronary angioplasty status: Secondary | ICD-10-CM | POA: Diagnosis not present

## 2016-05-31 DIAGNOSIS — E11649 Type 2 diabetes mellitus with hypoglycemia without coma: Secondary | ICD-10-CM | POA: Diagnosis present

## 2016-05-31 DIAGNOSIS — Z7982 Long term (current) use of aspirin: Secondary | ICD-10-CM | POA: Diagnosis not present

## 2016-05-31 DIAGNOSIS — R4189 Other symptoms and signs involving cognitive functions and awareness: Secondary | ICD-10-CM | POA: Diagnosis present

## 2016-05-31 DIAGNOSIS — E162 Hypoglycemia, unspecified: Secondary | ICD-10-CM | POA: Diagnosis present

## 2016-05-31 DIAGNOSIS — F17290 Nicotine dependence, other tobacco product, uncomplicated: Secondary | ICD-10-CM | POA: Diagnosis present

## 2016-05-31 DIAGNOSIS — F209 Schizophrenia, unspecified: Secondary | ICD-10-CM | POA: Diagnosis present

## 2016-05-31 DIAGNOSIS — I10 Essential (primary) hypertension: Secondary | ICD-10-CM | POA: Diagnosis present

## 2016-05-31 DIAGNOSIS — Z89612 Acquired absence of left leg above knee: Secondary | ICD-10-CM | POA: Diagnosis not present

## 2016-05-31 DIAGNOSIS — E782 Mixed hyperlipidemia: Secondary | ICD-10-CM | POA: Diagnosis not present

## 2016-05-31 DIAGNOSIS — Z955 Presence of coronary angioplasty implant and graft: Secondary | ICD-10-CM | POA: Diagnosis not present

## 2016-05-31 DIAGNOSIS — Z880 Allergy status to penicillin: Secondary | ICD-10-CM | POA: Diagnosis not present

## 2016-05-31 DIAGNOSIS — E1159 Type 2 diabetes mellitus with other circulatory complications: Secondary | ICD-10-CM | POA: Diagnosis present

## 2016-05-31 DIAGNOSIS — K5900 Constipation, unspecified: Secondary | ICD-10-CM | POA: Diagnosis present

## 2016-05-31 DIAGNOSIS — N390 Urinary tract infection, site not specified: Secondary | ICD-10-CM | POA: Diagnosis present

## 2016-05-31 DIAGNOSIS — Z79899 Other long term (current) drug therapy: Secondary | ICD-10-CM | POA: Diagnosis not present

## 2016-05-31 LAB — HIV ANTIBODY (ROUTINE TESTING W REFLEX): HIV SCREEN 4TH GENERATION: NONREACTIVE

## 2016-05-31 LAB — GLUCOSE, CAPILLARY
GLUCOSE-CAPILLARY: 88 mg/dL (ref 65–99)
GLUCOSE-CAPILLARY: 93 mg/dL (ref 65–99)
Glucose-Capillary: 107 mg/dL — ABNORMAL HIGH (ref 65–99)
Glucose-Capillary: 132 mg/dL — ABNORMAL HIGH (ref 65–99)

## 2016-05-31 MED ORDER — INSULIN ASPART 100 UNIT/ML ~~LOC~~ SOLN: 5.0000 [IU] | Freq: Three times a day (TID) | SUBCUTANEOUS | 11 refills | Status: DC

## 2016-05-31 MED ORDER — ATORVASTATIN CALCIUM 80 MG PO TABS: 80.0000 mg | ORAL_TABLET | Freq: Every day | ORAL | 11 refills | Status: DC

## 2016-05-31 MED ORDER — INSULIN ASPART 100 UNIT/ML ~~LOC~~ SOLN
2.0000 [IU] | Freq: Three times a day (TID) | SUBCUTANEOUS | Status: DC
  Administered 2016-06-01 (×2): 2 [IU] via SUBCUTANEOUS

## 2016-05-31 MED ORDER — TICAGRELOR 90 MG PO TABS
ORAL_TABLET | ORAL | Status: AC
  Filled 2016-05-31: qty 1

## 2016-05-31 MED ORDER — TICAGRELOR 60 MG PO TABS: 60.0000 mg | ORAL_TABLET | Freq: Two times a day (BID) | ORAL | 1 refills | Status: DC

## 2016-05-31 MED ORDER — GEMFIBROZIL 600 MG PO TABS: 600.0000 mg | ORAL_TABLET | Freq: Two times a day (BID) | ORAL | 0 refills | Status: DC

## 2016-05-31 MED ORDER — INSULIN DETEMIR 100 UNIT/ML ~~LOC~~ SOLN: 30.0000 [IU] | Freq: Every day | SUBCUTANEOUS | 11 refills | Status: DC

## 2016-05-31 MED ORDER — FENOFIBRATE 160 MG PO TABS
160.0000 mg | ORAL_TABLET | Freq: Every day | ORAL | Status: DC
  Administered 2016-05-31 – 2016-06-01 (×2): 160 mg via ORAL
  Filled 2016-05-31 (×2): qty 1

## 2016-05-31 MED ORDER — CLOZAPINE 100 MG PO TABS
ORAL_TABLET | ORAL | Status: AC
  Filled 2016-05-31: qty 5

## 2016-05-31 MED ORDER — LATANOPROST 0.005 % OP SOLN
OPHTHALMIC | Status: AC
  Filled 2016-05-31: qty 2.5

## 2016-05-31 NOTE — Progress Notes (Signed)
PROGRESS NOTE    Corrion Stirewalt  VWU:[id number] DOB: Oct 18, 1960 DOA: 05/30/2016 PCP: Anselm Jungling, NP    Brief Narrative:  56 year old male with history of hypertension, diabetes and coronary artery disease presents to the hospital with altered mental status and hypoglycemia. He is on her blood sugar 60. Workup in the emergency room indicated an elevated troponin of 0.9. He was admitted for further evaluation. Since admission, his troponins have trended down. Insulin has been adjusted and blood sugars have improved. He was seen by cardiology who did not feel that further invasive workup is necessary at this time. Echocardiogram has been ordered and pending. Once this complete, he can likely be discharged back to his group home.   Assessment & Plan:   Active Problems:   Tobacco abuse   Schizophrenia (HCC)   Essential hypertension   CAD -S/P RCA DES Sept 2015   Hyperlipidemia   Elevated troponin   Type 2 diabetes mellitus with circulatory disorder (HCC)   Hypoglycemia   1. Elevated troponin. Unclear etiology. He does not have a convincing history of chest pain. Cardiac markers were elevated on admission at 0.9, but have since trended down. ekg is non acute. Echo ordered yesterday and is still pending. Cardiology consulted and does not plan on further invasive testing at this time. Felt to be related to demand ischemia.  2. CAD s/p stent in 2015. Continue on aspirin and brilinta. He is not having chest pain at present. Brilinta dose decreased to  bid per cardiology. Continued on ASA, statin and beta blocker. Follow up with Dr. Diona Browner  3. Diabetes with hypoglycemia. Changes in mental status likely related to hypoglycemia. Blood sugars improving. Continue to monitor. Continue on lantus and reduced dose of novolog. Follow blood sugars.  4. HTN. Continue home medications. Blood pressures currently stable  5. HLD. Continue statin  6. Schizophrenia. Continue home dose of  psychotropics. Seems to have some cognitive deficits at baseline  7. Tobacco abuse. Counseled on the importance of tobacco cessation   DVT prophylaxis: lovenox Code Status: full code Family Communication: no family present Disposition Plan: discharge back to group home when echo complete   Consultants:   cardiology  Procedures:   Echo pending  Antimicrobials:     Subjective: Feeling better. No chest pain  Objective: Vitals:   05/31/16 1200 05/31/16 1300 05/31/16 1400 05/31/16 1500  BP: 112/89     Pulse: 85 80 79 88  Resp: 18 17 (!) 21 (!) 22  Temp:      TempSrc:      SpO2: 100% 98% 99% (!) 80%  Weight:      Height:        Intake/Output Summary (Last 24 hours) at 05/31/16 1703 Last data filed at 05/31/16 1200  Gross per 24 hour  Intake          2936.25 ml  Output             1950 ml  Net           986.25 ml   Filed Weights   05/30/16 0041  Weight: 79.4 kg (175 lb)    Examination:  General exam: Appears calm and comfortable  Respiratory system: Clear to auscultation. Respiratory effort normal. Cardiovascular system: S1 & S2 heard, RRR. No JVD, murmurs, rubs, gallops or clicks. No pedal edema. Gastrointestinal system: Abdomen is nondistended, soft and nontender. No organomegaly or masses felt. Normal bowel sounds heard. Central nervous system: Alert and oriented. No focal neurological deficits. Extremities:  Symmetric 5 x 5 power. Skin: No rashes, lesions or ulcers Psychiatry: Judgement and insight appear normal. Mood & affect appropriate.     Data Reviewed: I have personally reviewed following labs and imaging studies  CBC:  Recent Labs Lab 05/30/16 0059 05/30/16 1018  WBC 7.3 8.3  NEUTROABS 6.2  --   HGB 13.6 13.3  HCT 40.2 39.7  MCV 87.8 87.3  PLT 125* 120*   Basic Metabolic Panel:  Recent Labs Lab 05/30/16 0059 05/30/16 1018  NA 139  --   K 3.7  --   CL 107  --   CO2 24  --   GLUCOSE 183*  --   BUN 17  --   CREATININE 1.15  1.17  CALCIUM 8.9  --    GFR: Estimated Creatinine Clearance: 69.8 mL/min (by C-G formula based on SCr of 1.17 mg/dL). Liver Function Tests:  Recent Labs Lab 05/30/16 0059  AST 17  ALT 14*  ALKPHOS 80  BILITOT 0.6  PROT 6.7  ALBUMIN 3.7   No results for input(s): LIPASE, AMYLASE in the last 168 hours. No results for input(s): AMMONIA in the last 168 hours. Coagulation Profile: No results for input(s): INR, PROTIME in the last 168 hours. Cardiac Enzymes:  Recent Labs Lab 05/30/16 0059 05/30/16 0515 05/30/16 1018 05/30/16 1304 05/30/16 1613  TROPONINI 0.91* 0.83* 0.80* 0.79* 0.74*   BNP (last 3 results) No results for input(s): PROBNP in the last 8760 hours. HbA1C: No results for input(s): HGBA1C in the last 72 hours. CBG:  Recent Labs Lab 05/30/16 1648 05/30/16 2108 05/31/16 0733 05/31/16 1120 05/31/16 1611  GLUCAP 107* 94 107* 88 93   Lipid Profile: No results for input(s): CHOL, HDL, LDLCALC, TRIG, CHOLHDL, LDLDIRECT in the last 72 hours. Thyroid Function Tests: No results for input(s): TSH, T4TOTAL, FREET4, T3FREE, THYROIDAB in the last 72 hours. Anemia Panel: No results for input(s): VITAMINB12, FOLATE, FERRITIN, TIBC, IRON, RETICCTPCT in the last 72 hours. Sepsis Labs: No results for input(s): PROCALCITON, LATICACIDVEN in the last 168 hours.  Recent Results (from the past 240 hour(s))  Urine culture     Status: None (Preliminary result)   Collection Time: 05/30/16 12:52 AM  Result Value Ref Range Status   Specimen Description URINE, CLEAN CATCH  Final   Special Requests NONE  Final   Culture   Final    CULTURE REINCUBATED FOR BETTER GROWTH Performed at [facility], [address] [place], [place] [address]    Report Status PENDING  Incomplete  MRSA PCR Screening     Status: None   Collection Time: 05/30/16 11:30 AM  Result Value Ref Range Status   MRSA by PCR NEGATIVE NEGATIVE Final    Comment:        The GeneXpert MRSA Assay  (FDA approved for NASAL specimens only), is one component of a comprehensive MRSA colonization surveillance program. It is not intended to diagnose MRSA infection nor to guide or monitor treatment for MRSA infections.          Radiology Studies: Dg Chest 2 View  Result Date: 05/30/2016 CLINICAL DATA:  Slurred speech and upper extremity paresthesias tonight. EXAM: CHEST  2 VIEW COMPARISON:  02/22/2014 FINDINGS: The lungs are clear. The pulmonary vasculature is normal. Heart size is normal. Hilar and mediastinal contours are unremarkable. There is no pleural effusion. IMPRESSION: No active cardiopulmonary disease. Electronically Signed   By: Ellery Plunk M.D.   On: 05/30/2016 02:59   Ct Head Wo Contrast  Result Date: 05/30/2016 CLINICAL DATA:  56 y/o  M; headaches and altered mental status. EXAM: CT HEAD WITHOUT CONTRAST TECHNIQUE: Contiguous axial images were obtained from the base of the skull through the vertex without intravenous contrast. COMPARISON:  None. FINDINGS: Brain: No evidence of acute infarction, hemorrhage, hydrocephalus, extra-axial collection or mass lesion/mass effect. 7 mm of cerebellar tonsillar ectopia. Vascular: No hyperdense vessel or unexpected calcification. Skull: Normal. Negative for fracture or focal lesion. Sinuses/Orbits: No acute finding. Other: None. IMPRESSION: 1. No acute intracranial abnormality. 2. 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 No hydrocephalus. 3. Otherwise unremarkable CT of the head. Electronically Signed   By: Mitzi Hansen M.D.   On: 05/30/2016 02:38        Scheduled Meds: . amLODipine  5 mg Oral Daily  . ARIPiprazole  5 mg Oral Daily  . aspirin EC  81 mg Oral Daily  . atorvastatin  80 mg Oral q1800  . benztropine  1 mg Oral QHS  . clonazePAM  0.5 mg Oral QHS  . cloZAPine  150 mg Oral Daily  . cloZAPine  500 mg Oral QHS  . docusate sodium  100 mg Oral Daily  . DULoxetine  60 mg  Oral Daily  . enoxaparin (LOVENOX) injection  40 mg Subcutaneous Q24H  . fenofibrate  160 mg Oral Daily  . insulin aspart  0-5 Units Subcutaneous QHS  . insulin aspart  0-9 Units Subcutaneous TID WC  . insulin aspart  5 Units Subcutaneous TID AC  . insulin detemir  30 Units Subcutaneous Daily  . latanoprost  1 drop Both Eyes QHS  . metoprolol  50 mg Oral BID  . pantoprazole  40 mg Oral BID  . saccharomyces boulardii  250 mg Oral BID  . tamsulosin  0.4 mg Oral Daily  . ticagrelor  90 mg Oral BID  . traZODone  50 mg Oral QHS   Continuous Infusions:   LOS: 0 days    Time spent:    Zakir Henner, MD [facility] Pager [phone]  If 7PM-7AM, please contact night-coverage [email or web address] Password [email or web address] 05/31/2016, 5:03 PM

## 2016-05-31 NOTE — Progress Notes (Signed)
*  PRELIMINARY RESULTS* Echocardiogram 2D Echocardiogram has been performed.  Stacey Drain 05/31/2016, 6:07 PM

## 2016-05-31 NOTE — Care Management (Addendum)
CM received consult. Patient is from a [facility]). CSW already aware and making arrangements for return to [facility]. Patient is ind with ADL's. No CM needs known at this time.

## 2016-05-31 NOTE — Progress Notes (Signed)
PT ALERT AND ORIENTED Skin -WARM AND DRY. IV  PATENT. VOING WELL. VSS. NSR ON MONITOR.TRANSFERRING TO ROOM 316 ON TELEMETRY. TRANSFER REPORT CALLED TO MORGAN RN ON 300.

## 2016-06-01 LAB — GLUCOSE, CAPILLARY
GLUCOSE-CAPILLARY: 157 mg/dL — AB (ref 65–99)
Glucose-Capillary: 117 mg/dL — ABNORMAL HIGH (ref 65–99)
Glucose-Capillary: 146 mg/dL — ABNORMAL HIGH (ref 65–99)

## 2016-06-01 LAB — ECHOCARDIOGRAM COMPLETE
AVLVOTPG: 5 mmHg
CHL CUP DOP CALC LVOT VTI: 19.6 cm
CHL CUP STROKE VOLUME: 19 mL
E/e' ratio: 9.04
EWDT: 151 ms
FS: 32 % (ref 28–44)
Height: 66 in
IV/PV OW: 1.11
LA ID, A-P, ES: 30 mm
LA diam end sys: 30 mm
LA diam index: 1.54 cm/m2
LA vol A4C: 25.8 ml
LA vol index: 14.6 mL/m2
LAVOL: 28.3 mL
LV E/e' medial: 9.04
LV E/e'average: 9.04
LV SIMPSON'S DISK: 48
LV TDI E'LATERAL: 7.94
LV TDI E'MEDIAL: 6.42
LV dias vol index: 21 mL/m2
LV e' LATERAL: 7.94 cm/s
LV sys vol index: 11 mL/m2
LV sys vol: 21 mL
LVDIAVOL: 40 mL — AB (ref 62–150)
LVOT area: 2.27 cm2
LVOTD: 17 mm
LVOTPV: 108 cm/s
LVOTSV: 44 mL
Lateral S' vel: 12.9 cm/s
MV Dec: 151
MV Peak grad: 2 mmHg
MV pk A vel: 92.2 m/s
MVPKEVEL: 71.8 m/s
PW: 10 mm — AB (ref 0.6–1.1)
TAPSE: 17.7 mm
Weight: 2800 oz

## 2016-06-01 MED ORDER — INSULIN ASPART 100 UNIT/ML ~~LOC~~ SOLN: 2.0000 [IU] | Freq: Three times a day (TID) | SUBCUTANEOUS | 11 refills | Status: DC

## 2016-06-01 MED ORDER — FENOFIBRATE 160 MG PO TABS: 160.0000 mg | ORAL_TABLET | Freq: Every day | ORAL | 1 refills | Status: DC

## 2016-06-01 NOTE — Progress Notes (Addendum)
LCSW following for disposition:  Return to ALF once medically stable:  Patient is from [facility], spoke with resident director who informs patient is welcomed back any time. Updated that DC may be today in which he was excited and ready. Also reports, if discharged over weekend, can return as well.  Will need FL2 updated once discharged in which LCSW will completed. Facility will come at discharge and pick patient up (later in day around 5pm if discharged on Friday).   Patient to discharge to family care home today with facility coming to pick patient up around 5:30pm. RN called to notify of time for pick up.  FL2 updated.  No other needs. DC to [facility]F.  Deretha Emory, MSW Clinical Social Work: Optician, dispensing Coverage for :  [phone]

## 2016-06-01 NOTE — Care Management Important Message (Signed)
Important Message  Patient Details  Name: Oscar Elliott MRN: [id number] Date of Birth: [birth year]   Medicare Important Message Given:  Yes    Demetries Coia, Chrystine Oiler, RN 06/01/2016, 3:14 PM

## 2016-06-01 NOTE — [place] FL2 (Signed)
[place] MEDICAID FL2 LEVEL OF CARE SCREENING TOOL     IDENTIFICATION  Patient Name: Oscar Elliott Birthdate: August 03, 1960 Sex: male Admission Date (Current Location): 05/30/2016  [place] and [place] Number:  Aaron Edelman [id number] [facility] Facility and Address:  [facility],  [address] [place] [address]      Provider Number: [id number]  Attending Physician Name and Address:  Erick Blinks, MD  Relative Name and Phone Number:       Current Level of Care: Domiciliary (Rest home) Recommended Level of Care: Family Care Home, Assisted Living Facility Prior Approval Number:    Date Approved/Denied:   PASRR Number:    Discharge Plan: Domiciliary (Rest home)    Current Diagnoses: Patient Active Problem List   Diagnosis Date Noted  . Hypoglycemia 05/30/2016  . Type 2 diabetes mellitus with circulatory disorder (HCC) 10/25/2015  . Encounter for screening colonoscopy 04/21/2014  . Cholelithiases 04/21/2014  . Hemorrhoids 04/21/2014  . Elevated troponin 02/24/2014  . Nausea and vomiting 02/23/2014  . Abdominal pain   . C. difficile colitis   . Hypokalemia   . Vomiting and diarrhea 02/22/2014  . CAD -S/P RCA DES Sept 2015 12/02/2013  . Hyperlipidemia 12/02/2013  . Tobacco abuse   . Peripheral vascular disease (HCC)   . Schizophrenia (HCC)   . Essential hypertension     Orientation RESPIRATION BLADDER Height & Weight     Self, Time, Situation, Place  Normal Continent Weight: 175 lb (79.4 kg) Height:   (167.6 cm)  BEHAVIORAL SYMPTOMS/MOOD NEUROLOGICAL BOWEL NUTRITION STATUS      Continent Diet (See DC summary)  AMBULATORY STATUS COMMUNICATION OF NEEDS Skin   Supervision Verbally Normal                       Personal Care Assistance Level of Assistance  Bathing, Feeding, Dressing Bathing Assistance: Limited assistance Feeding assistance: Independent Dressing Assistance: Limited assistance     Functional Limitations Info  Sight, Hearing,  Speech Sight Info: Adequate Hearing Info: Adequate Speech Info: Adequate    SPECIAL CARE FACTORS FREQUENCY                       Contractures Contractures Info: Not present    Additional Factors Info  Code Status, Allergies, Psychotropic Code Status Info: Full Code Allergies Info: Peniccilins Psychotropic Info: Abilify, Cogentin, Cymbalta         Current Medications (06/01/2016):  This is the current hospital active medication list Current Facility-Administered Medications  Medication Dose Route Frequency Provider Last Rate Last Dose  . acetaminophen (TYLENOL) tablet 650 mg  650 mg Oral Q4H PRN Erick Blinks, MD   650 mg at 06/01/16 1039  . amLODipine (NORVASC) tablet 5 mg  5 mg Oral Daily Erick Blinks, MD   5 mg at 06/01/16 1040  . ARIPiprazole (ABILIFY) tablet 5 mg  5 mg Oral Daily Erick Blinks, MD   5 mg at 06/01/16 1039  . aspirin EC tablet 81 mg  81 mg Oral Daily Erick Blinks, MD   81 mg at 06/01/16 1040  . atorvastatin (LIPITOR) tablet 80 mg  80 mg Oral q1800 Erick Blinks, MD   80 mg at 05/31/16 1717  . benztropine (COGENTIN) tablet 1 mg  1 mg Oral QHS Erick Blinks, MD   1 mg at 05/31/16 2132  . clonazePAM (KLONOPIN) tablet 0.5 mg  0.5 mg Oral QHS Erick Blinks, MD   0.5 mg at 05/31/16 2132  .  cloZAPine (CLOZARIL) tablet 150 mg  150 mg Oral Daily Erick Blinks, MD   150 mg at 06/01/16 1235  . cloZAPine (CLOZARIL) tablet 500 mg  500 mg Oral QHS Erick Blinks, MD   500 mg at 05/31/16 2147  . docusate sodium (COLACE) capsule 100 mg  100 mg Oral Daily Erick Blinks, MD   100 mg at 06/01/16 1039  . DULoxetine (CYMBALTA) DR capsule 60 mg  60 mg Oral Daily Erick Blinks, MD   60 mg at 06/01/16 1040  . enoxaparin (LOVENOX) injection 40 mg  40 mg Subcutaneous Q24H Erick Blinks, MD   40 mg at 06/01/16 1039  . fenofibrate tablet 160 mg  160 mg Oral Daily Erick Blinks, MD   160 mg at 06/01/16 1039  . insulin aspart (novoLOG) injection 0-5 Units  0-5 Units  Subcutaneous QHS Erick Blinks, MD      . insulin aspart (novoLOG) injection 0-9 Units  0-9 Units Subcutaneous TID WC Erick Blinks, MD   1 Units at 06/01/16 1236  . insulin aspart (novoLOG) injection 2 Units  2 Units Subcutaneous TID AC Erick Blinks, MD   2 Units at 06/01/16 1236  . insulin detemir (LEVEMIR) injection 30 Units  30 Units Subcutaneous Daily Erick Blinks, MD   30 Units at 06/01/16 1235  . latanoprost (XALATAN) 0.005 % ophthalmic solution 1 drop  1 drop Both Eyes QHS Erick Blinks, MD   1 drop at 05/31/16 2148  . metoprolol (LOPRESSOR) tablet 50 mg  50 mg Oral BID Erick Blinks, MD   50 mg at 06/01/16 1040  . nitroGLYCERIN (NITROSTAT) SL tablet 0.4 mg  0.4 mg Sublingual Q5 Min x 3 PRN Erick Blinks, MD      . ondansetron (ZOFRAN) injection 4 mg  4 mg Intravenous Q6H PRN Erick Blinks, MD      . pantoprazole (PROTONIX) EC tablet 40 mg  40 mg Oral BID Erick Blinks, MD   40 mg at 06/01/16 1039  . saccharomyces boulardii (FLORASTOR) capsule 250 mg  250 mg Oral BID Erick Blinks, MD   250 mg at 06/01/16 1040  . tamsulosin (FLOMAX) capsule 0.4 mg  0.4 mg Oral Daily Erick Blinks, MD   0.4 mg at 06/01/16 1040  . ticagrelor (BRILINTA) tablet 90 mg  90 mg Oral BID Erick Blinks, MD   90 mg at 06/01/16 1234  . traZODone (DESYREL) tablet 50 mg  50 mg Oral QHS Erick Blinks, MD   50 mg at 05/31/16 2132     Discharge Medications: Medication List    STOP taking these medications   gemfibrozil 600 MG tablet Commonly known as:  LOPID     TAKE these medications   acetaminophen 500 MG tablet Commonly known as:  TYLENOL Take 500 mg by mouth every 4 (four) hours as needed for moderate pain or headache.   amLODipine 5 MG tablet Commonly known as:  NORVASC Take 5 mg by mouth daily.   ARIPiprazole 15 MG tablet Commonly known as:  ABILIFY Take 15 mg by mouth daily.   aspirin EC 81 MG tablet Take 81 mg by mouth daily.   atorvastatin 80 MG tablet Commonly known as:   LIPITOR Take 1 tablet (80 mg total) by mouth daily at 6 PM. What changed:  See the new instructions.   benztropine 1 MG tablet Commonly known as:  COGENTIN Take 1 mg by mouth at bedtime.   clonazePAM 0.5 MG tablet Commonly known as:  KLONOPIN Take 0.5 mg by mouth at  bedtime.   cloZAPine 100 MG tablet Commonly known as:  CLOZARIL Take 100 mg by mouth 2 (two) times daily. Take  by mouth in AM and  by mouth in PM   docusate sodium 100 MG capsule Commonly known as:  COLACE Take 100 mg by mouth 2 (two) times daily.   DULoxetine 60 MG capsule Commonly known as:  CYMBALTA Take 120 mg by mouth daily.   fenofibrate 160 MG tablet Take 1 tablet (160 mg total) by mouth daily. Start taking on:  06/02/2016   hydrOXYzine 25 MG tablet Commonly known as:  ATARAX/VISTARIL Take 25 mg by mouth 2 (two) times daily as needed for anxiety.   insulin aspart 100 UNIT/ML injection Commonly known as:  novoLOG Inject 2 Units into the skin 3 (three) times daily before meals. What changed:  how much to take   insulin detemir 100 UNIT/ML injection Commonly known as:  LEVEMIR Inject 0.3 mLs (30 Units total) into the skin daily. What changed:  how much to take  how to take this  when to take this  additional instructions   latanoprost 0.005 % ophthalmic solution Commonly known as:  XALATAN Place 1 drop into both eyes at bedtime.   metoprolol 50 MG tablet Commonly known as:  LOPRESSOR Take 1 tablet (50 mg total) by mouth 2 (two) times daily.   mupirocin ointment 2 % Commonly known as:  BACTROBAN Place 1 application into the nose 3 (three) times daily.   nitroGLYCERIN 0.4 MG SL tablet Commonly known as:  NITROSTAT Place 1 tablet (0.4 mg total) under the tongue every 5 (five) minutes x 3 doses as needed for chest pain.   omeprazole 40 MG capsule Commonly known as:  PRILOSEC Take 40 mg by mouth 2 (two) times daily.   tamsulosin 0.4 MG Caps capsule Commonly  known as:  FLOMAX Take 0.4 mg by mouth daily.   ticagrelor 60 MG Tabs tablet Commonly known as:  BRILINTA Take 1 tablet (60 mg total) by mouth 2 (two) times daily. What changed:  See the new instructions.   TUSSIN DM 10-100 MG/5ML liquid Generic drug:  dextromethorphan-guaiFENesin Take 10 mLs by mouth every 4 (four) hours as needed for cough.      Relevant Imaging Results:  Relevant Lab Results:   Additional Information    Raye Sorrow, LCSW

## 2016-06-01 NOTE — Progress Notes (Signed)
Discharge instruction packet given to caregiver, patient out in stable condition via wheelchair with staff.

## 2016-06-01 NOTE — Discharge Summary (Signed)
Physician Discharge Summary  Oscar Elliott NWG:[id number] DOB: 11-25-1960 DOA: 05/30/2016  PCP: Anselm Jungling, NP  Admit date: 05/30/2016 Discharge date: 06/01/2016  Admitted From: ALF Disposition:  ALF  Recommendations for Outpatient Follow-up:  1. Follow up with PCP in 1-2 weeks 2. Please obtain BMP/CBC in one week 3. Follow up with cardiology to discuss echo results   Discharge Condition:stable CODE STATUS: full code Diet recommendation: Heart Healthy / Carb Modified   Brief/Interim Summary: 56 year old male with history of hypertension, diabetes and coronary artery disease presents to the hospital with altered mental status and hypoglycemia. He was found to have a blood sugar of 60. Workup in the emergency room indicated an elevated troponin of 0.9. He was admitted for further evaluation. Since admission, his troponins have trended down. He has not had any chest pain. Insulin has been adjusted and blood sugars have improved. He was seen by cardiology who did not feel that further invasive workup is necessary at this time. Echocardiogram shows EF 50% and mild hypokinesis of mid inferoseptal and basal-mid inferior myocardium. Discussed with Dr. Purvis Sheffield who felt that this could be further worked up as an outpatient. He will be set up with cardiology clinic. He is otherwise stable for discharge home. Gemfibrozil was changed to fenofibrate due to high risk of rhabdomyolysis with gemfibrozil and concomitant high dose lipitor.  Discharge Diagnoses:  Active Problems:   Tobacco abuse   Schizophrenia (HCC)   Essential hypertension   CAD -S/P RCA DES Sept 2015   Hyperlipidemia   Elevated troponin   Type 2 diabetes mellitus with circulatory disorder (HCC)   Hypoglycemia    Discharge Instructions  Discharge Instructions    Diet - low sodium heart healthy    Complete by:  As directed    Increase activity slowly    Complete by:  As directed      Allergies as of 06/01/2016    Reactions   Penicillins Other (See Comments)   Reaction unknown      Medication List    STOP taking these medications   gemfibrozil 600 MG tablet Commonly known as:  LOPID     TAKE these medications   acetaminophen 500 MG tablet Commonly known as:  TYLENOL Take 500 mg by mouth every 4 (four) hours as needed for moderate pain or headache.   amLODipine 5 MG tablet Commonly known as:  NORVASC Take 5 mg by mouth daily.   ARIPiprazole 15 MG tablet Commonly known as:  ABILIFY Take 15 mg by mouth daily.   aspirin EC 81 MG tablet Take 81 mg by mouth daily.   atorvastatin 80 MG tablet Commonly known as:  LIPITOR Take 1 tablet (80 mg total) by mouth daily at 6 PM. What changed:  See the new instructions.   benztropine 1 MG tablet Commonly known as:  COGENTIN Take 1 mg by mouth at bedtime.   clonazePAM 0.5 MG tablet Commonly known as:  KLONOPIN Take 0.5 mg by mouth at bedtime.   cloZAPine 100 MG tablet Commonly known as:  CLOZARIL Take 100 mg by mouth 2 (two) times daily. Take  by mouth in AM and  by mouth in PM   docusate sodium 100 MG capsule Commonly known as:  COLACE Take 100 mg by mouth 2 (two) times daily.   DULoxetine 60 MG capsule Commonly known as:  CYMBALTA Take 120 mg by mouth daily.   fenofibrate 160 MG tablet Take 1 tablet (160 mg total) by mouth daily. Start taking on:  06/02/2016   hydrOXYzine 25 MG tablet Commonly known as:  ATARAX/VISTARIL Take 25 mg by mouth 2 (two) times daily as needed for anxiety.   insulin aspart 100 UNIT/ML injection Commonly known as:  novoLOG Inject 2 Units into the skin 3 (three) times daily before meals. What changed:  how much to take   insulin detemir 100 UNIT/ML injection Commonly known as:  LEVEMIR Inject 0.3 mLs (30 Units total) into the skin daily. What changed:  how much to take  how to take this  when to take this  additional instructions   latanoprost 0.005 % ophthalmic solution Commonly  known as:  XALATAN Place 1 drop into both eyes at bedtime.   metoprolol 50 MG tablet Commonly known as:  LOPRESSOR Take 1 tablet (50 mg total) by mouth 2 (two) times daily.   mupirocin ointment 2 % Commonly known as:  BACTROBAN Place 1 application into the nose 3 (three) times daily.   nitroGLYCERIN 0.4 MG SL tablet Commonly known as:  NITROSTAT Place 1 tablet (0.4 mg total) under the tongue every 5 (five) minutes x 3 doses as needed for chest pain.   omeprazole 40 MG capsule Commonly known as:  PRILOSEC Take 40 mg by mouth 2 (two) times daily.   tamsulosin 0.4 MG Caps capsule Commonly known as:  FLOMAX Take 0.4 mg by mouth daily.   ticagrelor 60 MG Tabs tablet Commonly known as:  BRILINTA Take 1 tablet (60 mg total) by mouth 2 (two) times daily. What changed:  See the new instructions.   TUSSIN DM 10-100 MG/5ML liquid Generic drug:  dextromethorphan-guaiFENesin Take 10 mLs by mouth every 4 (four) hours as needed for cough.       Allergies  Allergen Reactions  . Penicillins Other (See Comments)    Reaction unknown    Consultations:  cardiology   Procedures/Studies: Dg Chest 2 View  Result Date: 05/30/2016 CLINICAL DATA:  Slurred speech and upper extremity paresthesias tonight. EXAM: CHEST  2 VIEW COMPARISON:  02/22/2014 FINDINGS: The lungs are clear. The pulmonary vasculature is normal. Heart size is normal. Hilar and mediastinal contours are unremarkable. There is no pleural effusion. IMPRESSION: No active cardiopulmonary disease. Electronically Signed   By: Ellery Plunk M.D.   On: 05/30/2016 02:59   Ct Head Wo Contrast  Result Date: 05/30/2016 CLINICAL DATA:  56 y/o  M; headaches and altered mental status. EXAM: CT HEAD WITHOUT CONTRAST TECHNIQUE: Contiguous axial images were obtained from the base of the skull through the vertex without intravenous contrast. COMPARISON:  None. FINDINGS: Brain: No evidence of acute infarction, hemorrhage, hydrocephalus,  extra-axial collection or mass lesion/mass effect. 7 mm of cerebellar tonsillar ectopia. Vascular: No hyperdense vessel or unexpected calcification. Skull: Normal. Negative for fracture or focal lesion. Sinuses/Orbits: No acute finding. Other: None. IMPRESSION: 1. No acute intracranial abnormality. 2. 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 No hydrocephalus. 3. Otherwise unremarkable CT of the head. Electronically Signed   By: Mitzi Hansen M.D.   On: 05/30/2016 02:38    Echo: - Left ventricle: The cavity size was normal. Wall thickness was   increased in a pattern of mild LVH. Systolic function was at the   lower limits of normal. The estimated ejection fraction was 50%.   Doppler parameters are consistent with abnormal left ventricular   relaxation (grade 1 diastolic dysfunction). - Regional wall motion abnormality: Mild hypokinesis of the mid   inferoseptal and basal-mid inferior myocardium.   Subjective:  No chest pain no shortness of breath  Discharge Exam: Vitals:   06/01/16 0530 06/01/16 1300  BP: 106/75 99/62  Pulse: 78 79  Resp: 18 18  Temp: 98.4 F (36.9 C) 98.5 F (36.9 C)   Vitals:   05/31/16 1500 05/31/16 2113 06/01/16 0530 06/01/16 1300  BP:  107/77 106/75 99/62  Pulse: 88 83 78 79  Resp: (!) Temp:  98.6 F (37 C) 98.4 F (36.9 C) 98.5 F (36.9 C)  TempSrc:  Oral Oral Oral  SpO2: (!) 80% 95% 97% 100%  Weight:      Height:        General: Pt is alert, awake, not in acute distress Cardiovascular: RRR, S1/S2 +, no rubs, no gallops Respiratory: CTA bilaterally, no wheezing, no rhonchi Abdominal: Soft, NT, ND, bowel sounds + Extremities: no edema, no cyanosis    The results of significant diagnostics from this hospitalization (including imaging, microbiology, ancillary and laboratory) are listed below for reference.     Microbiology: Recent Results (from the past 240 hour(s))  Urine culture      Status: Abnormal (Preliminary result)   Collection Time: 05/30/16 12:52 AM  Result Value Ref Range Status   Specimen Description URINE, CLEAN CATCH  Final   Special Requests NONE  Final   Culture (A)  Final    >=100,000 COLONIES/mL CITROBACTER KOSERI SUSCEPTIBILITIES TO FOLLOW Performed at [facility], [address] [place], [place] [address]    Report Status PENDING  Incomplete  MRSA PCR Screening     Status: None   Collection Time: 05/30/16 11:30 AM  Result Value Ref Range Status   MRSA by PCR NEGATIVE NEGATIVE Final    Comment:        The GeneXpert MRSA Assay (FDA approved for NASAL specimens only), is one component of a comprehensive MRSA colonization surveillance program. It is not intended to diagnose MRSA infection nor to guide or monitor treatment for MRSA infections.      Labs: BNP (last 3 results) No results for input(s): BNP in the last 8760 hours. Basic Metabolic Panel:  Recent Labs Lab 05/30/16 0059 05/30/16 1018  NA 139  --   K 3.7  --   CL 107  --   CO2 24  --   GLUCOSE 183*  --   BUN 17  --   CREATININE 1.15 1.17  CALCIUM 8.9  --    Liver Function Tests:  Recent Labs Lab 05/30/16 0059  AST 17  ALT 14*  ALKPHOS 80  BILITOT 0.6  PROT 6.7  ALBUMIN 3.7   No results for input(s): LIPASE, AMYLASE in the last 168 hours. No results for input(s): AMMONIA in the last 168 hours. CBC:  Recent Labs Lab 05/30/16 0059 05/30/16 1018  WBC 7.3 8.3  NEUTROABS 6.2  --   HGB 13.6 13.3  HCT 40.2 39.7  MCV 87.8 87.3  PLT 125* 120*   Cardiac Enzymes:  Recent Labs Lab 05/30/16 0059 05/30/16 0515 05/30/16 1018 05/30/16 1304 05/30/16 1613  TROPONINI 0.91* 0.83* 0.80* 0.79* 0.74*   BNP: Invalid input(s): POCBNP CBG:  Recent Labs Lab 05/31/16 1120 05/31/16 1611 05/31/16 2109 06/01/16 0748 06/01/16 1134  GLUCAP 88 93 132* 117* 146*   D-Dimer No results for input(s): DDIMER in the last 72 hours. Hgb A1c No results for  input(s): HGBA1C in the last 72 hours. Lipid Profile No results for input(s): CHOL, HDL, LDLCALC, TRIG, CHOLHDL, LDLDIRECT in the last 72  hours. Thyroid function studies No results for input(s): TSH, T4TOTAL, T3FREE, THYROIDAB in the last 72 hours.  Invalid input(s): FREET3 Anemia work up No results for input(s): VITAMINB12, FOLATE, FERRITIN, TIBC, IRON, RETICCTPCT in the last 72 hours. Urinalysis    Component Value Date/Time   COLORURINE YELLOW 05/30/2016 0052   APPEARANCEUR HAZY (A) 05/30/2016 0052   APPEARANCEUR Clear 07/08/2013 1606   LABSPEC 1.009 05/30/2016 0052   LABSPEC 1.010 07/08/2013 1606   PHURINE 5.0 05/30/2016 0052   GLUCOSEU NEGATIVE 05/30/2016 0052   GLUCOSEU Negative 07/08/2013 1606   HGBUR NEGATIVE 05/30/2016 0052   BILIRUBINUR NEGATIVE 05/30/2016 0052   BILIRUBINUR Negative 07/08/2013 1606   KETONESUR NEGATIVE 05/30/2016 0052   PROTEINUR NEGATIVE 05/30/2016 0052   UROBILINOGEN 0.2 02/22/2014 1853   NITRITE NEGATIVE 05/30/2016 0052   LEUKOCYTESUR LARGE (A) 05/30/2016 0052   LEUKOCYTESUR 3+ 07/08/2013 1606   Sepsis Labs Invalid input(s): PROCALCITONIN,  WBC,  LACTICIDVEN Microbiology Recent Results (from the past 240 hour(s))  Urine culture     Status: Abnormal (Preliminary result)   Collection Time: 05/30/16 12:52 AM  Result Value Ref Range Status   Specimen Description URINE, CLEAN CATCH  Final   Special Requests NONE  Final   Culture (A)  Final    >=100,000 COLONIES/mL CITROBACTER KOSERI SUSCEPTIBILITIES TO FOLLOW Performed at [facility], [address] [place], [place] [address]    Report Status PENDING  Incomplete  MRSA PCR Screening     Status: None   Collection Time: 05/30/16 11:30 AM  Result Value Ref Range Status   MRSA by PCR NEGATIVE NEGATIVE Final    Comment:        The GeneXpert MRSA Assay (FDA approved for NASAL specimens only), is one component of a comprehensive MRSA colonization surveillance program. It is not intended  to diagnose MRSA infection nor to guide or monitor treatment for MRSA infections.      Time coordinating discharge: Over 30 minutes  SIGNED:   Erick Blinks, MD  [facility] 06/01/2016, 2:52 PM Pager   If 7PM-7AM, please contact night-coverage [email or web address] Password [email or web address]

## 2016-06-02 LAB — URINE CULTURE

## 2016-06-13 NOTE — Progress Notes (Signed)
Cardiology Office Note  Date: 06/14/2016   ID: Oscar Elliott, DOB [birth year], MRN [id number]  PCP: Anselm Jungling, NP  Primary Cardiologist: Nona Dell, MD   Chief Complaint  Patient presents with  . Hospitalization Follow-up    History of Present Illness: Oscar Elliott is a 56 y.o. male last seen in August 2017 by [name] (I last saw him in April 2016). Record review indicates recent hospitalization with altered mental status and hypoglycemia. During workup he had an elevated troponin I of 0.9 in the absence of chest pain. He was seen by Dr. Purvis Sheffield and echocardiogram was obtained. Medical therapy was continued from a cardiac perspective.  He is here today with his care home owner. We went over his medications. Patient does not report any progressive problems since discharge from the hospital. Occasional headache.  We discussed increasing Lipitor back to 80 mg daily and also stopping Brilinta with institution of Plavix instead.  Past Medical History:  Diagnosis Date  . CAD (coronary artery disease)    a. 10/2013 Lateral STEMI/PCI: LM nl, LAD min irregs, D1 occluded sub branch, LCX min irregs mid, OM large/patent, RCA 183m (2.75x28 Promus DES).  . Essential hypertension   . History of renal failure    a. 06/2007: in setting of rhabdo post L AKA.  Marland Kitchen Hyperlipidemia   . MI (myocardial infarction) (HCC)   . Peripheral vascular disease (HCC)    a. 06/2007: s/p L AKA @ [facility]  . Schizophrenia (HCC)   . Type 2 diabetes mellitus (HCC)     Past Surgical History:  Procedure Laterality Date  . Above-the-knee amputation Left   . LEFT HEART CATHETERIZATION WITH CORONARY ANGIOGRAM N/A 11/20/2013   Procedure: LEFT HEART CATHETERIZATION WITH CORONARY ANGIOGRAM;  Surgeon: Corky Crafts, MD;  Location[facility] C[facility] LAB;  Service: Cardiovascular;  Laterality: N/A;    Current Outpatient Prescriptions  Medication Sig Dispense Refill  . acetaminophen (TYLENOL) 500 MG tablet  Take 500 mg by mouth every 4 (four) hours as needed for moderate pain or headache.    Marland Kitchen amLODipine (NORVASC) 5 MG tablet Take 5 mg by mouth daily.    . ARIPiprazole (ABILIFY) 15 MG tablet Take 15 mg by mouth daily.    Marland Kitchen aspirin EC 81 MG tablet Take 81 mg by mouth daily.    Marland Kitchen atorvastatin (LIPITOR) 80 MG tablet Take 1 tablet (80 mg total) by mouth daily at 6 PM. 30 tablet 11  . benztropine (COGENTIN) 1 MG tablet Take 1 mg by mouth at bedtime.    . clonazePAM (KLONOPIN) 0.5 MG tablet Take 0.5 mg by mouth at bedtime.    . cloZAPine (CLOZARIL) 100 MG tablet Take 100 mg by mouth 2 (two) times daily. Take  by mouth in AM and  by mouth in PM     . dextromethorphan-guaiFENesin (TUSSIN DM) 10-100 MG/5ML liquid Take 10 mLs by mouth every 4 (four) hours as needed for cough.    . docusate sodium (COLACE) 100 MG capsule Take 100 mg by mouth 2 (two) times daily.     . DULoxetine (CYMBALTA) 60 MG capsule Take 120 mg by mouth daily.     . fenofibrate 160 MG tablet Take 1 tablet (160 mg total) by mouth daily. 30 tablet 1  . hydrOXYzine (ATARAX/VISTARIL) 25 MG tablet Take 25 mg by mouth 2 (two) times daily as needed for anxiety.    . insulin aspart (NOVOLOG) 100 UNIT/ML injection Inject 2 Units into the skin 3 (three)  times daily before meals. 10 mL 11  . insulin detemir (LEVEMIR) 100 UNIT/ML injection Inject 0.3 mLs (30 Units total) into the skin daily. 10 mL 11  . latanoprost (XALATAN) 0.005 % ophthalmic solution Place 1 drop into both eyes at bedtime.    . metoprolol (LOPRESSOR) 50 MG tablet Take 1 tablet (50 mg total) by mouth 2 (two) times daily. 180 tablet 3  . mupirocin ointment (BACTROBAN) 2 % Place 1 application into the nose 3 (three) times daily.    . nitroGLYCERIN (NITROSTAT) 0.4 MG SL tablet Place 1 tablet (0.4 mg total) under the tongue every 5 (five) minutes x 3 doses as needed for chest pain. 25 tablet 12  . omeprazole (PRILOSEC) 40 MG capsule Take 40 mg by mouth 2 (two) times daily.       . tamsulosin (FLOMAX) 0.4 MG CAPS capsule Take 0.4 mg by mouth daily.     . ticagrelor (BRILINTA) 60 MG TABS tablet Take 1 tablet (60 mg total) by mouth 2 (two) times daily. 60 tablet 1   No current facility-administered medications for this visit.    Allergies:  Penicillins   Social History: The patient  reports that he has been smoking Cigars and Cigarettes.  He has a 5.00 pack-year smoking history. He has never used smokeless tobacco. He reports that he does not drink alcohol or use drugs.   ROS:  Please see the history of present illness. Otherwise, complete review of systems is positive for headaches.  All other systems are reviewed and negative.   Physical Exam: VS:  BP 108/70   Pulse 92   Ht  (1.575 m)   Wt 138 lb (62.6 kg)   SpO2 97%   BMI 25.24 kg/m , BMI Body mass index is 25.24 kg/m.  Wt Readings from Last 3 Encounters:  06/14/16 138 lb (62.6 kg)  05/30/16 175 lb (79.4 kg)  10/25/15 144 lb (65.3 kg)    Patient appears comfortable at rest. Using a walker. HEENT: Conjunctiva and lids normal, oropharynx clear. Neck: Supple, no elevated JVP or carotid bruits, no thyromegaly. Lungs: Clear to auscultation, nonlabored breathing at rest. Cardiac: Regular rate and rhythm, no S3, soft systolic murmur, no pericardial rub. Abdomen: Soft, nontender, bowel sounds present. Extremities: Status post left AKA. Skin: Warm and dry.  ECG: I personally reviewed the tracing from 05/30/2016 which showed sinus rhythm.  Recent Labwork: 05/30/2016: ALT 14; AST 17; BUN 17; Creatinine, Ser 1.17; Hemoglobin 13.3; Platelets 120; Potassium 3.7; Sodium 139     Component Value Date/Time   CHOL 102 11/21/2013 0123   TRIG 66 11/21/2013 0123   HDL 27 (L) 11/21/2013 0123   CHOLHDL 3.8 11/21/2013 0123   VLDL 13 11/21/2013 0123   LDLCALC 62 11/21/2013 0123    Other Studies Reviewed Today:  Echocardiogram 05/31/2016: Study Conclusions  - Left ventricle: The cavity size was normal. Wall  thickness was   increased in a pattern of mild LVH. Systolic function was at the   lower limits of normal. The estimated ejection fraction was 50%.   Doppler parameters are consistent with abnormal left ventricular   relaxation (grade 1 diastolic dysfunction). - Regional wall motion abnormality: Mild hypokinesis of the mid   inferoseptal and basal-mid inferior myocardium.  Chest x-ray 05/30/2016: FINDINGS: The lungs are clear. The pulmonary vasculature is normal. Heart size is normal. Hilar and mediastinal contours are unremarkable. There is no pleural effusion.  IMPRESSION: No active cardiopulmonary disease.  Assessment and Plan:  1.  CAD status post DES to the RCA in 2015. He had recent hospitalization with altered mental status in the setting of hypoglycemia and was found to have mildly abnormal troponin I levels although no active chest pain. Echocardiogram revealed LVEF 50% with mild inferior/inferoseptal hypokinesis. Plan is to continue medical therapy. Brilinta will be stopped in favor of using Plavix. Reintensify statin therapy.  2. Essential hypertension, blood pressure is well controlled today.  3. History of PAD status post left AKA. Patient uses a walker to ambulate and has prosthesis.  4. Hyperlipidemia, will stay on Lipitor at 80 mg daily, dose corrected.  Current medicines were reviewed with the patient today.  Disposition: Follow-up in 6 months.  Signed, Jonelle Sidle, MD, [facility] 06/14/2016 3:59 PM    [facility] at [facility] [address], [place], [place] [address] Phone: [phone]; Fax: [phone]

## 2016-06-14 ENCOUNTER — Encounter: Payer: Self-pay | Admitting: Cardiology

## 2016-06-14 ENCOUNTER — Ambulatory Visit (INDEPENDENT_AMBULATORY_CARE_PROVIDER_SITE_OTHER): Payer: Medicare Other | Admitting: Cardiology

## 2016-06-14 VITALS — BP 108/70 | HR 92 | Ht 62.0 in | Wt 138.0 lb

## 2016-06-14 DIAGNOSIS — I25119 Atherosclerotic heart disease of native coronary artery with unspecified angina pectoris: Secondary | ICD-10-CM

## 2016-06-14 DIAGNOSIS — I10 Essential (primary) hypertension: Secondary | ICD-10-CM

## 2016-06-14 DIAGNOSIS — I209 Angina pectoris, unspecified: Secondary | ICD-10-CM

## 2016-06-14 DIAGNOSIS — E782 Mixed hyperlipidemia: Secondary | ICD-10-CM

## 2016-06-14 DIAGNOSIS — I739 Peripheral vascular disease, unspecified: Secondary | ICD-10-CM | POA: Diagnosis not present

## 2016-06-14 MED ORDER — CLOPIDOGREL BISULFATE 75 MG PO TABS: 75.0000 mg | ORAL_TABLET | Freq: Every day | ORAL | 3 refills | Status: DC

## 2016-06-14 NOTE — Patient Instructions (Signed)
Your physician wants you to follow-up in: 6 months You will receive a reminder letter in the mail two months in advance. If you don't receive a letter, please call our office to schedule the follow-up appointment.     STOP Brilinta  START Plavix 75 mg daily  Take Atorvastatin 80 mg daily       Thank you for choosing [facility] !

## 2017-01-08 NOTE — Progress Notes (Signed)
Cardiology Office Note  Date: 01/09/2017   ID: Oscar Elliott, DOB 11/07/1960, MRN [id number]  PCP: Anselm JunglingHatchett, Mary, NP  Primary Cardiologist: Nona DellSamuel Chealsey Miyamoto, MD   Chief Complaint  Patient presents with  . Coronary Artery Disease    History of Present Illness: Oscar Elliott is a 56 y.o. male last seen in April.  He presents today for a routine follow-up visit.  Continues to reside in a care home, is here with an Geophysicist/field seismologistassistant today.  He does not report any angina symptoms.  States that he continues to ambulate using a walker, left leg prosthesis in place.  I reviewed his medications which are stable from a cardiac perspective.  He continues on aspirin, Plavix, Norvasc, Lipitor, Lopressor, and has as needed nitroglycerin available.  Follow-up echocardiogram from April of this year is noted below.  LVEF was approximately 50% at that time.  We have continued with medical therapy for the management of ischemic heart disease.  Past Medical History:  Diagnosis Date  . CAD (coronary artery disease)    a. 10/2013 Lateral STEMI/PCI: LM nl, LAD min irregs, D1 occluded sub branch, LCX min irregs mid, OM large/patent, RCA 16829m (2.75x28 Promus DES).  . Essential hypertension   . History of renal failure    a. 06/2007: in setting of rhabdo post L AKA.  Marland Kitchen. Hyperlipidemia   . MI (myocardial infarction) (HCC)   . Peripheral vascular disease (HCC)    a. 06/2007: s/p L AKA @ [facility]  . Schizophrenia (HCC)   . Type 2 diabetes mellitus (HCC)     Past Surgical History:  Procedure Laterality Date  . Above-the-knee amputation Left     Current Outpatient Medications  Medication Sig Dispense Refill  . acetaminophen (TYLENOL) 500 MG tablet Take 500 mg by mouth every 4 (four) hours as needed for moderate pain or headache.    Marland Kitchen. amLODipine (NORVASC) 5 MG tablet Take 5 mg by mouth daily.    . ARIPiprazole (ABILIFY) 15 MG tablet Take 15 mg by mouth daily.    Marland Kitchen. aspirin EC 81 MG tablet Take 81 mg by mouth  daily.    Marland Kitchen. atorvastatin (LIPITOR) 80 MG tablet Take 1 tablet (80 mg total) by mouth daily at 6 PM. 30 tablet 11  . benztropine (COGENTIN) 1 MG tablet Take 1 mg by mouth at bedtime.    . clonazePAM (KLONOPIN) 0.5 MG tablet Take 0.5 mg by mouth at bedtime.    . clopidogrel (PLAVIX) 75 MG tablet Take 1 tablet (75 mg total) by mouth daily. 90 tablet 3  . cloZAPine (CLOZARIL) 100 MG tablet Take 100 mg by mouth 2 (two) times daily. Take 150mg  by mouth in AM and 500mg  by mouth in PM     . dextromethorphan-guaiFENesin (TUSSIN DM) 10-100 MG/5ML liquid Take 10 mLs by mouth every 4 (four) hours as needed for cough.    . docusate sodium (COLACE) 100 MG capsule Take 100 mg by mouth 2 (two) times daily.     . DULoxetine (CYMBALTA) 60 MG capsule Take 120 mg by mouth daily.     . hydrOXYzine (ATARAX/VISTARIL) 25 MG tablet Take 25 mg by mouth 2 (two) times daily as needed for anxiety.    . insulin aspart (NOVOLOG) 100 UNIT/ML injection Inject 2 Units into the skin 3 (three) times daily before meals. 10 mL 11  . insulin detemir (LEVEMIR) 100 UNIT/ML injection Inject 0.3 mLs (30 Units total) into the skin daily. 10 mL 11  . latanoprost (XALATAN) 0.005 %  ophthalmic solution Place 1 drop into both eyes at bedtime.    . metoprolol (LOPRESSOR) 50 MG tablet Take 1 tablet (50 mg total) by mouth 2 (two) times daily. 180 tablet 3  . mupirocin ointment (BACTROBAN) 2 % Place 1 application into the nose 3 (three) times daily.    . nitroGLYCERIN (NITROSTAT) 0.4 MG SL tablet Place 1 tablet (0.4 mg total) under the tongue every 5 (five) minutes x 3 doses as needed for chest pain. 25 tablet 12  . omeprazole (PRILOSEC) 40 MG capsule Take 40 mg by mouth 2 (two) times daily.     . tamsulosin (FLOMAX) 0.4 MG CAPS capsule Take 0.4 mg by mouth daily.      No current facility-administered medications for this visit.    Allergies:  Penicillins   Social History: The patient  reports that he has been smoking cigars and cigarettes.  He  has a 5.00 pack-year smoking history. he has never used smokeless tobacco. He reports that he does not drink alcohol or use drugs.   ROS:  Please see the history of present illness. Otherwise, complete review of systems is positive for intermittent abdominal discomfort..  All other systems are reviewed and negative.   Physical Exam: VS:  BP 118/76 (BP Location: Right Arm)   Pulse 91   Ht 5\' 2"  (1.575 m)   Wt 136 lb (61.7 kg)   SpO2 96%   BMI 24.87 kg/m , BMI Body mass index is 24.87 kg/m.  Wt Readings from Last 3 Encounters:  01/09/17 136 lb (61.7 kg)  06/14/16 138 lb (62.6 kg)  05/30/16 175 lb (79.4 kg)    General: Patient appears comfortable at rest, uses a walker to ambulate. HEENT: Conjunctiva and lids normal, oropharynx clear. Neck: Supple, no elevated JVP or carotid bruits, no thyromegaly. Lungs: Clear to auscultation, nonlabored breathing at rest. Cardiac: Regular rate and rhythm, no S3, 2/6 systolic murmur, no pericardial rub. Abdomen: Soft, nontender, bowel sounds present, no guarding or rebound. Extremities: Status post left AKA with prosthesis in place, distal pulses 1+ on right. Skin: Warm and dry.  ECG: I personally reviewed the tracing from 05/30/2016 which showed normal sinus rhythm.  Recent Labwork: 05/30/2016: ALT 14; AST 17; BUN 17; Creatinine, Ser 1.17; Hemoglobin 13.3; Platelets 120; Potassium 3.7; Sodium 139     Component Value Date/Time   CHOL 102 11/21/2013 0123   TRIG 66 11/21/2013 0123   HDL 27 (L) 11/21/2013 0123   CHOLHDL 3.8 11/21/2013 0123   VLDL 13 11/21/2013 0123   LDLCALC 62 11/21/2013 0123    Other Studies Reviewed Today:  Echocardiogram 05/31/2016: Study Conclusions  - Left ventricle: The cavity size was normal. Wall thickness was   increased in a pattern of mild LVH. Systolic function was at the   lower limits of normal. The estimated ejection fraction was 50%.   Doppler parameters are consistent with abnormal left ventricular    relaxation (grade 1 diastolic dysfunction). - Regional wall motion abnormality: Mild hypokinesis of the mid   inferoseptal and basal-mid inferior myocardium.  Assessment and Plan:  1.  Symptomatically stable CAD with history of DES to the RCA in 2015.  In the absence of progressive angina symptoms, we will continue with medical therapy and observation.  No changes made to current regimen.  2.  Hyperlipidemia, continues on Lipitor.  Requesting most recent lab work from PCP.  LFTs were normal in April.  3.  Essential hypertension, blood pressure is well controlled today.  4.  PAD status post left AKA.  Patient has prosthesis in place and also uses a walker.  Current medicines were reviewed with the patient today.  Disposition: Follow-up in 8 months.  Signed, Jonelle Sidle, MD, [facility] 01/09/2017 9:12 AM    [facility] at [facility] [address], [place], [place] [address] Phone: [phone]; Fax: [phone]

## 2017-01-09 ENCOUNTER — Ambulatory Visit (INDEPENDENT_AMBULATORY_CARE_PROVIDER_SITE_OTHER): Payer: Medicare Other | Admitting: Cardiology

## 2017-01-09 ENCOUNTER — Encounter: Payer: Self-pay | Admitting: Cardiology

## 2017-01-09 VITALS — BP 118/76 | HR 91 | Ht 62.0 in | Wt 136.0 lb

## 2017-01-09 DIAGNOSIS — I739 Peripheral vascular disease, unspecified: Secondary | ICD-10-CM | POA: Diagnosis not present

## 2017-01-09 DIAGNOSIS — E782 Mixed hyperlipidemia: Secondary | ICD-10-CM | POA: Diagnosis not present

## 2017-01-09 DIAGNOSIS — I25119 Atherosclerotic heart disease of native coronary artery with unspecified angina pectoris: Secondary | ICD-10-CM | POA: Diagnosis not present

## 2017-01-09 DIAGNOSIS — I209 Angina pectoris, unspecified: Secondary | ICD-10-CM | POA: Diagnosis not present

## 2017-01-09 DIAGNOSIS — I10 Essential (primary) hypertension: Secondary | ICD-10-CM | POA: Diagnosis not present

## 2017-01-09 NOTE — Patient Instructions (Signed)
Medication Instructions:  Your physician recommends that you continue on your current medications as directed. Please refer to the Current Medication list given to you today.   Labwork: I WILL REQUEST A COPY OF LABS FROM PCP.   Testing/Procedures: NONE  Follow-Up: Your physician wants you to follow-up in: 8 MONTHS . You will receive a reminder letter in the mail two months in advance. If you don't receive a letter, please call our office to schedule the follow-up appointment.   Any Other Special Instructions Will Be Listed Below (If Applicable).     If you need a refill on your cardiac medications before your next appointment, please call your pharmacy.
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
## 2017-04-10 ENCOUNTER — Ambulatory Visit (INDEPENDENT_AMBULATORY_CARE_PROVIDER_SITE_OTHER): Payer: Medicare Other | Admitting: Psychiatry

## 2017-04-10 ENCOUNTER — Encounter (INDEPENDENT_AMBULATORY_CARE_PROVIDER_SITE_OTHER): Payer: Self-pay

## 2017-04-10 ENCOUNTER — Encounter (HOSPITAL_COMMUNITY): Payer: Self-pay | Admitting: Psychiatry

## 2017-04-10 VITALS — BP 118/82 | HR 87 | Ht 62.0 in | Wt 135.0 lb

## 2017-04-10 DIAGNOSIS — F209 Schizophrenia, unspecified: Secondary | ICD-10-CM

## 2017-04-10 DIAGNOSIS — F17210 Nicotine dependence, cigarettes, uncomplicated: Secondary | ICD-10-CM | POA: Diagnosis not present

## 2017-04-10 MED ORDER — ARIPIPRAZOLE 15 MG PO TABS: 15.0000 mg | ORAL_TABLET | Freq: Every day | ORAL | 0 refills | Status: DC

## 2017-04-10 MED ORDER — HYDROXYZINE HCL 25 MG PO TABS: 25.0000 mg | ORAL_TABLET | Freq: Two times a day (BID) | ORAL | 0 refills | Status: DC | PRN

## 2017-04-10 MED ORDER — BENZTROPINE MESYLATE 1 MG PO TABS: 1.0000 mg | ORAL_TABLET | Freq: Every day | ORAL | 0 refills | Status: DC

## 2017-04-10 MED ORDER — DULOXETINE HCL 60 MG PO CPEP: 120.0000 mg | ORAL_CAPSULE | Freq: Every day | ORAL | 0 refills | Status: DC

## 2017-04-10 NOTE — Patient Instructions (Signed)
1. Continue Clozapine 100 mg and 500 at night 2. Continue Abilify 15 mg daily  3. Continue Benztropine 1 mg daily 4. Continue Duloxetine 120 mg daily  5. Continue Hydroxyzine 25 mg twice a day 6. Continue Clonazepam 0.5 mg daily 7. Obtain record from [facility] 8. Obtain lab (CBC) for monthly monitoring  (last labs on 03/19/2017)

## 2017-04-12 ENCOUNTER — Telehealth (HOSPITAL_COMMUNITY): Payer: Self-pay | Admitting: Psychiatry

## 2017-04-12 DIAGNOSIS — F209 Schizophrenia, unspecified: Secondary | ICD-10-CM

## 2017-04-12 NOTE — Telephone Encounter (Signed)
Ordered labs

## 2017-04-12 NOTE — Telephone Encounter (Signed)
Please [facility]. It seems like they missed to do labs from Oct through Nov, December. Clozapine registry warned us about this and we now need to do weekly labs for clozapine. Please ask them to obtain labs and fax to us as soon as possible  (they should have one order already). Will also fax them other labs order.  Noted:  Lab schedule: weekly for six months, Every 2 weeks from 6-12 months, then monthly.

## 2017-04-17 ENCOUNTER — Other Ambulatory Visit (HOSPITAL_COMMUNITY): Payer: Self-pay | Admitting: Psychiatry

## 2017-04-17 ENCOUNTER — Telehealth (HOSPITAL_COMMUNITY): Payer: Self-pay | Admitting: Psychiatry

## 2017-04-17 MED ORDER — CLOZAPINE 100 MG PO TABS: 100.0000 mg | ORAL_TABLET | Freq: Every day | ORAL | 0 refills | Status: DC

## 2017-04-17 MED ORDER — CLOZAPINE 100 MG PO TABS: 500.0000 mg | ORAL_TABLET | Freq: Every day | ORAL | 0 refills | Status: DC

## 2017-04-17 NOTE — Telephone Encounter (Signed)
[facility]. Make sure to obtain labs and fax the result to us as discussed. This needs to be done weekly for the next six months so that he can continue clozapine.

## 2017-04-17 NOTE — Telephone Encounter (Signed)
Called Facility # [phone] & was given the Administrator : Sula Sodalfreda Graves  # [phone]  Lvm requesting  Labs as discussed for the next 6 months . Left office # & fax # on voice mail

## 2017-04-17 NOTE — Telephone Encounter (Signed)
Noted  

## 2017-04-19 ENCOUNTER — Other Ambulatory Visit: Payer: Self-pay

## 2017-04-19 MED ORDER — CLOPIDOGREL BISULFATE 75 MG PO TABS: 75.0000 mg | ORAL_TABLET | Freq: Every day | ORAL | 3 refills | Status: DC

## 2017-04-19 NOTE — Telephone Encounter (Signed)
Refilled plavix to [facility]

## 2017-04-23 ENCOUNTER — Other Ambulatory Visit (HOSPITAL_COMMUNITY): Payer: Self-pay | Admitting: Medical

## 2017-04-23 DIAGNOSIS — N183 Chronic kidney disease, stage 3 unspecified: Secondary | ICD-10-CM

## 2017-05-07 ENCOUNTER — Ambulatory Visit (HOSPITAL_COMMUNITY)
Admission: RE | Admit: 2017-05-07 | Discharge: 2017-05-07 | Disposition: A | Discharge status: Home / Self Care | Payer: Medicare Other | Source: Ambulatory Visit | Attending: Medical | Admitting: Medical

## 2017-05-07 ENCOUNTER — Encounter (HOSPITAL_COMMUNITY): Payer: Self-pay
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
## 2017-05-09 ENCOUNTER — Ambulatory Visit (HOSPITAL_COMMUNITY): Payer: Self-pay | Admitting: Psychiatry

## 2017-05-10 ENCOUNTER — Encounter (HOSPITAL_COMMUNITY): Payer: Self-pay | Admitting: Psychiatry

## 2017-05-10 ENCOUNTER — Ambulatory Visit (INDEPENDENT_AMBULATORY_CARE_PROVIDER_SITE_OTHER): Payer: Medicare Other | Admitting: Psychiatry

## 2017-05-10 VITALS — BP 112/72 | HR 92 | Ht 62.0 in | Wt 135.0 lb

## 2017-05-10 DIAGNOSIS — F209 Schizophrenia, unspecified: Secondary | ICD-10-CM

## 2017-05-10 DIAGNOSIS — R443 Hallucinations, unspecified: Secondary | ICD-10-CM

## 2017-05-10 DIAGNOSIS — F17210 Nicotine dependence, cigarettes, uncomplicated: Secondary | ICD-10-CM | POA: Diagnosis not present

## 2017-05-10 MED ORDER — CLOZAPINE 100 MG PO TABS: 100.0000 mg | ORAL_TABLET | Freq: Every day | ORAL | 0 refills | Status: DC

## 2017-05-10 MED ORDER — CLOZAPINE 100 MG PO TABS: 500.0000 mg | ORAL_TABLET | Freq: Every day | ORAL | 0 refills | Status: DC

## 2017-05-10 NOTE — Patient Instructions (Addendum)
1. Continue Clozapine 100 mg and 500 at night (enough meds until 2/28) 2. Continue Abilify 15 mg daily  3. Continue Benztropine 1 mg daily 4. Continue Duloxetine 120 mg daily  5. Continue Hydroxyzine 25 mg twice a day 6. Continue Clonazepam 0.5 mg daily 7. Obtain record from [facility] 8. Obtain lab (CBC) for monthly monitoring   9. Return to clinic in two months for 30 mins

## 2017-05-14 ENCOUNTER — Other Ambulatory Visit (HOSPITAL_COMMUNITY): Payer: Self-pay | Admitting: Psychiatry

## 2017-05-14 ENCOUNTER — Ambulatory Visit (HOSPITAL_COMMUNITY)
Admission: RE | Admit: 2017-05-14 | Discharge: 2017-05-14 | Disposition: A | Discharge status: Home / Self Care | Payer: Medicare Other | Source: Ambulatory Visit | Attending: Medical | Admitting: Medical

## 2017-05-14 DIAGNOSIS — N200 Calculus of kidney: Secondary | ICD-10-CM | POA: Insufficient documentation

## 2017-05-14 DIAGNOSIS — N183 Chronic kidney disease, stage 3 unspecified: Secondary | ICD-10-CM

## 2017-05-14 NOTE — Telephone Encounter (Signed)
[facility] Diagnosis: schizophrenia. Abilify is not listed in medication list.

## 2017-06-04 ENCOUNTER — Emergency Department (HOSPITAL_COMMUNITY)
Admission: EM | Admit: 2017-06-04 | Discharge: 2017-06-04 | Disposition: A | Discharge status: Home / Self Care | Payer: Medicare Other | Attending: Emergency Medicine | Admitting: Emergency Medicine

## 2017-06-04 ENCOUNTER — Encounter (HOSPITAL_COMMUNITY): Payer: Self-pay | Admitting: Emergency Medicine

## 2017-06-04 ENCOUNTER — Other Ambulatory Visit: Payer: Self-pay

## 2017-06-04 DIAGNOSIS — F17210 Nicotine dependence, cigarettes, uncomplicated: Secondary | ICD-10-CM | POA: Insufficient documentation

## 2017-06-04 DIAGNOSIS — I2510 Atherosclerotic heart disease of native coronary artery without angina pectoris: Secondary | ICD-10-CM | POA: Diagnosis not present

## 2017-06-04 DIAGNOSIS — E1159 Type 2 diabetes mellitus with other circulatory complications: Secondary | ICD-10-CM | POA: Diagnosis not present

## 2017-06-04 DIAGNOSIS — N3090 Cystitis, unspecified without hematuria: Secondary | ICD-10-CM | POA: Insufficient documentation

## 2017-06-04 DIAGNOSIS — Z7982 Long term (current) use of aspirin: Secondary | ICD-10-CM | POA: Insufficient documentation

## 2017-06-04 DIAGNOSIS — D696 Thrombocytopenia, unspecified: Secondary | ICD-10-CM | POA: Diagnosis not present

## 2017-06-04 DIAGNOSIS — Z7902 Long term (current) use of antithrombotics/antiplatelets: Secondary | ICD-10-CM | POA: Insufficient documentation

## 2017-06-04 DIAGNOSIS — R197 Diarrhea, unspecified: Secondary | ICD-10-CM | POA: Diagnosis present

## 2017-06-04 DIAGNOSIS — I10 Essential (primary) hypertension: Secondary | ICD-10-CM | POA: Insufficient documentation

## 2017-06-04 DIAGNOSIS — Z79899 Other long term (current) drug therapy: Secondary | ICD-10-CM | POA: Insufficient documentation

## 2017-06-04 LAB — CBC
HEMATOCRIT: 40.5 % (ref 39.0–52.0)
HEMOGLOBIN: 14.2 g/dL (ref 13.0–17.0)
MCH: 29.2 pg (ref 26.0–34.0)
MCHC: 35.1 g/dL (ref 30.0–36.0)
MCV: 83.2 fL (ref 78.0–100.0)
PLATELETS: 105 10*3/uL — AB (ref 150–400)
RBC: 4.87 MIL/uL (ref 4.22–5.81)
RDW: 14.7 % (ref 11.5–15.5)
WBC: 4.5 10*3/uL (ref 4.0–10.5)

## 2017-06-04 LAB — COMPREHENSIVE METABOLIC PANEL
ALT: 14 U/L — ABNORMAL LOW (ref 17–63)
ANION GAP: 10 (ref 5–15)
AST: 17 U/L (ref 15–41)
Albumin: 2.8 g/dL — ABNORMAL LOW (ref 3.5–5.0)
Alkaline Phosphatase: 55 U/L (ref 38–126)
BUN: 20 mg/dL (ref 6–20)
CHLORIDE: 102 mmol/L (ref 101–111)
CO2: 24 mmol/L (ref 22–32)
Calcium: 8.6 mg/dL — ABNORMAL LOW (ref 8.9–10.3)
Creatinine, Ser: 1.18 mg/dL (ref 0.61–1.24)
Glucose, Bld: 178 mg/dL — ABNORMAL HIGH (ref 65–99)
POTASSIUM: 3.3 mmol/L — AB (ref 3.5–5.1)
SODIUM: 136 mmol/L (ref 135–145)
Total Bilirubin: 0.7 mg/dL (ref 0.3–1.2)
Total Protein: 6.1 g/dL — ABNORMAL LOW (ref 6.5–8.1)

## 2017-06-04 LAB — URINALYSIS, ROUTINE W REFLEX MICROSCOPIC
BILIRUBIN URINE: NEGATIVE
GLUCOSE, UA: NEGATIVE mg/dL
KETONES UR: NEGATIVE mg/dL
Nitrite: POSITIVE — AB
PH: 5 (ref 5.0–8.0)
Protein, ur: NEGATIVE mg/dL
SQUAMOUS EPITHELIAL / LPF: NONE SEEN
Specific Gravity, Urine: 1.008 (ref 1.005–1.030)

## 2017-06-04 LAB — LIPASE, BLOOD: LIPASE: 21 U/L (ref 11–51)

## 2017-06-04 LAB — MAGNESIUM: MAGNESIUM: 2.3 mg/dL (ref 1.7–2.4)

## 2017-06-04 MED ORDER — HYDROCODONE-ACETAMINOPHEN 5-325 MG PO TABS
1.0000 | ORAL_TABLET | Freq: Once | ORAL | Status: AC
  Administered 2017-06-04: 1 via ORAL
  Filled 2017-06-04: qty 1

## 2017-06-04 MED ORDER — SODIUM CHLORIDE 0.9 % IV SOLN
1.0000 g | Freq: Once | INTRAVENOUS | Status: AC
  Administered 2017-06-04: 1 g via INTRAVENOUS
  Filled 2017-06-04: qty 10

## 2017-06-04 MED ORDER — METOCLOPRAMIDE HCL 5 MG/ML IJ SOLN
10.0000 mg | Freq: Once | INTRAMUSCULAR | Status: AC
  Administered 2017-06-04: 10 mg via INTRAVENOUS
  Filled 2017-06-04: qty 2

## 2017-06-04 MED ORDER — CEPHALEXIN 500 MG PO CAPS: 500.0000 mg | ORAL_CAPSULE | Freq: Two times a day (BID) | ORAL | 0 refills | Status: DC

## 2017-06-04 MED ORDER — ONDANSETRON 4 MG PO TBDP
4.0000 mg | ORAL_TABLET | Freq: Once | ORAL | Status: AC
  Administered 2017-06-04: 4 mg via ORAL
  Filled 2017-06-04: qty 1

## 2017-06-04 MED ORDER — ONDANSETRON 4 MG PO TBDP: 4.0000 mg | ORAL_TABLET | Freq: Three times a day (TID) | ORAL | 0 refills | Status: DC | PRN

## 2017-06-04 NOTE — ED Notes (Signed)
When dispatcher called to cancel transport.-dispatcher states EMS in route to get patient, unable to cancel. [facility] Admin, Chales Salmononnie Graves, made aware.

## 2017-06-04 NOTE — ED Provider Notes (Signed)
[facility] EMERGENCY DEPARTMENT Provider Note   CSN: [id number] Arrival date & time: 06/04/17  9562     History   Chief Complaint Chief Complaint  Patient presents with  . Diarrhea    HPI Oscar Elliott is a 57 y.o. male.  HPI 57 year old with history of schizophrenia, CAD, hypertension, diabetes comes in with chief complaint of nausea, loose bowel movements x 3 days.  Patient denies any recent medications.  Patient is having lower quadrant abdominal pain that is mild.  Patient denies any bloody stools and he also denies any fevers, chills or poor p.o. Intake.  Review of system is positive for chronic and stable visual hallucination for at least the last 1 year.    Past Medical History:  Diagnosis Date  . CAD (coronary artery disease)    a. 10/2013 Lateral STEMI/PCI: LM nl, LAD min irregs, D1 occluded sub branch, LCX min irregs mid, OM large/patent, RCA 164m (2.75x28 Promus DES).  . Essential hypertension   . History of renal failure    a. 06/2007: in setting of rhabdo post L AKA.  Marland Kitchen Hyperlipidemia   . MI (myocardial infarction) (HCC)   . Peripheral vascular disease (HCC)    a. 06/2007: s/p L AKA @ [facility]  . Schizophrenia (HCC)   . Type 2 diabetes mellitus [facility])     Patient Active Problem List   Diagnosis Date Noted  . Hypoglycemia 05/30/2016  . Type 2 diabetes mellitus with circulatory disorder (HCC) 10/25/2015  . Encounter for screening colonoscopy 04/21/2014  . Cholelithiases 04/21/2014  . Hemorrhoids 04/21/2014  . Elevated troponin 02/24/2014  . Nausea and vomiting 02/23/2014  . Abdominal pain   . C. difficile colitis   . Hypokalemia   . Vomiting and diarrhea 02/22/2014  . CAD -S/P RCA DES Sept 2015 12/02/2013  . Hyperlipidemia 12/02/2013  . Tobacco abuse   . Peripheral vascular disease (HCC)   . Schizophrenia (HCC)   . Essential hypertension     Past Surgical History:  Procedure Laterality Date  . Above-the-knee amputation Left   . LEFT HEART  CATHETERIZATION WITH CORONARY ANGIOGRAM N/A 11/20/2013   Procedure: LEFT HEART CATHETERIZATION WITH CORONARY ANGIOGRAM;  Surgeon: Corky Crafts, MD;  Location: [facility] CATH LAB;  Service: Cardiovascular;  Laterality: N/A;        Home Medications    Prior to Admission medications   Medication Sig Start Date End Date Taking? Authorizing Provider  acetaminophen (TYLENOL) 500 MG tablet Take 500 mg by mouth every 4 (four) hours as needed for moderate pain or headache.    [provider]  amLODipine (NORVASC) 5 MG tablet Take 5 mg by mouth daily.    [provider]  ARIPiprazole (ABILIFY) 15 MG tablet Take 1 tablet (15 mg total) by mouth daily. 04/10/17   Neysa Hotter, MD  aspirin EC 81 MG tablet Take 81 mg by mouth daily.    [provider]  atorvastatin (LIPITOR) 80 MG tablet Take 1 tablet (80 mg total) by mouth daily at 6 PM. 05/31/16   Erick Blinks, MD  benztropine (COGENTIN) 1 MG tablet Take 1 tablet (1 mg total) by mouth at bedtime. 04/10/17   Neysa Hotter, MD  cephALEXin (KEFLEX) 500 MG capsule Take 1 capsule (500 mg total) by mouth 2 (two) times daily. 06/04/17   Derwood Kaplan, MD  clonazePAM (KLONOPIN) 0.5 MG tablet Take 0.5 mg by mouth at bedtime.    [provider]  clopidogrel (PLAVIX) 75 MG tablet Take 1 tablet (75  mg total) by mouth daily. 04/19/17   Jonelle SidleMcDowell, Samuel G, MD  cloZAPine (CLOZARIL) 100 MG tablet Take 100 mg by mouth 2 (two) times daily. Take 150mg  by mouth in AM and 500mg  by mouth in PM     [provider]  cloZAPine (CLOZARIL) 100 MG tablet Take 5 tablets (500 mg total) by mouth at bedtime. 05/10/17   Neysa HotterHisada, Reina, MD  cloZAPine (CLOZARIL) 100 MG tablet Take 1 tablet (100 mg total) by mouth daily. 05/10/17   Neysa HotterHisada, Reina, MD  dextromethorphan-guaiFENesin (TUSSIN DM) 10-100 MG/5ML liquid Take 10 mLs by mouth every 4 (four) hours as needed for cough.    [provider]  docusate sodium (COLACE) 100 MG capsule Take 100  mg by mouth 2 (two) times daily.     [provider]  DULoxetine (CYMBALTA) 60 MG capsule Take 2 capsules (120 mg total) by mouth daily. 04/10/17   Neysa HotterHisada, Reina, MD  hydrOXYzine (ATARAX/VISTARIL) 25 MG tablet Take 1 tablet (25 mg total) by mouth 2 (two) times daily as needed for anxiety. 04/10/17   Neysa HotterHisada, Reina, MD  insulin aspart (NOVOLOG) 100 UNIT/ML injection Inject 2 Units into the skin 3 (three) times daily before meals. 06/01/16   Erick BlinksMemon, Jehanzeb, MD  insulin detemir (LEVEMIR) 100 UNIT/ML injection Inject 0.3 mLs (30 Units total) into the skin daily. 05/31/16   Erick BlinksMemon, Jehanzeb, MD  latanoprost (XALATAN) 0.005 % ophthalmic solution Place 1 drop into both eyes at bedtime.    [provider]  metoprolol (LOPRESSOR) 50 MG tablet Take 1 tablet (50 mg total) by mouth 2 (two) times daily. 12/02/13   Dyann KiefLenze, Michele M, PA-C  mupirocin ointment (BACTROBAN) 2 % Place 1 application into the nose 3 (three) times daily.    [provider]  nitroGLYCERIN (NITROSTAT) 0.4 MG SL tablet Place 1 tablet (0.4 mg total) under the tongue every 5 (five) minutes x 3 doses as needed for chest pain. 11/23/13   Barrett, Joline Salthonda G, PA-C  omeprazole (PRILOSEC) 40 MG capsule Take 40 mg by mouth 2 (two) times daily.     [provider]  ondansetron (ZOFRAN ODT) 4 MG disintegrating tablet Take 1 tablet (4 mg total) by mouth every 8 (eight) hours as needed for nausea or vomiting. 06/04/17   Derwood KaplanNanavati, Bracha Frankowski, MD  tamsulosin (FLOMAX) 0.4 MG CAPS capsule Take 0.4 mg by mouth daily.     [provider]    Family History Family History  Problem Relation Age of Onset  . Other Unknown        no premature CAD.    Social History Social History   Tobacco Use  . Smoking status: Current Every Day Smoker    Packs/day: 0.25    Years: 20.00    Pack years: 5.00    Types: Cigars, Cigarettes  . Smokeless tobacco: Never Used  Substance Use Topics  . Alcohol use: No    Alcohol/week: 0.0 oz  .  Drug use: No     Allergies   Penicillins   Review of Systems Review of Systems  Constitutional: Positive for activity change.  Respiratory: Negative for shortness of breath.   Cardiovascular: Negative for chest pain.  Gastrointestinal: Positive for abdominal pain, diarrhea and nausea. Negative for blood in stool and rectal pain.  Psychiatric/Behavioral: Positive for hallucinations. Negative for suicidal ideas.  All other systems reviewed and are negative.    Physical Exam Updated Vital Signs BP 115/86 (BP Location: Left Arm)   Pulse 89   Resp 16  Ht 5\' 5"  (1.651 m)   Wt 61.2 kg (135 lb)   SpO2 96%   BMI 22.47 kg/m   Physical Exam  Constitutional: He is oriented to person, place, and time. He appears well-developed.  HENT:  Head: Atraumatic.  Neck: Neck supple.  Cardiovascular: Normal rate.  Pulmonary/Chest: Effort normal.  Abdominal: Soft. There is tenderness. There is no rebound and no guarding.  Musculoskeletal: He exhibits no edema or tenderness.  Neurological: He is alert and oriented to person, place, and time.  Skin: Skin is warm.  Nursing note and vitals reviewed.    ED Treatments / Results  Labs (all labs ordered are listed, but only abnormal results are displayed) Labs Reviewed  COMPREHENSIVE METABOLIC PANEL - Abnormal; Notable for the following components:      Result Value   Potassium 3.3 (*)    Glucose, Bld 178 (*)    Calcium 8.6 (*)    Total Protein 6.1 (*)    Albumin 2.8 (*)    ALT 14 (*)    All other components within normal limits  CBC - Abnormal; Notable for the following components:   Platelets 105 (*)    All other components within normal limits  URINALYSIS, ROUTINE W REFLEX MICROSCOPIC - Abnormal; Notable for the following components:   Hgb urine dipstick SMALL (*)    Nitrite POSITIVE (*)    Leukocytes, UA TRACE (*)    Bacteria, UA MANY (*)    All other components within normal limits  URINE CULTURE  LIPASE, BLOOD  MAGNESIUM     EKG None  Radiology No results found.  Procedures Procedures (including critical care time)  Medications Ordered in ED Medications  metoCLOPramide (REGLAN) injection 10 mg (10 mg Intravenous Given 06/04/17 1127)  HYDROcodone-acetaminophen (NORCO/VICODIN) 5-325 MG per tablet 1 tablet (1 tablet Oral Given 06/04/17 1128)  ondansetron (ZOFRAN-ODT) disintegrating tablet 4 mg (4 mg Oral Given 06/04/17 1124)  cefTRIAXone (ROCEPHIN) 1 g in sodium chloride 0.9 % 100 mL IVPB (1 g Intravenous New Bag/Given 06/04/17 1435)     Initial Impression / Assessment and Plan / ED Course  I have reviewed the triage vital signs and the nursing notes.  Pertinent labs & imaging results that were available during my care of the patient were reviewed by me and considered in my medical decision making (see chart for details).     57 year old male comes in with chief complaint of nausea, diarrhea and abdominal discomfort.  He denies any recent antibiotic use.  Abdominal exam is not peritoneal.  Review of system is positive for hallucinations, but there is no SI-HI and the hallucinations are not new.  Overall, exam was underwhelming.  We ordered basic labs which are reassuring, specifically the white count is normal and electrolytes did not show any concerning findings.  Patient is noted to have thrombocytopenia but that is not new.  Additionally the urine is nitrite positive.  I reassessed the patient again on 2 different occasions and his abdominal pain stayed the same, and remained non-peritoneal.  Oral challenge initiated and patient did well.  We will discharge patient with Keflex.    Final Clinical Impressions(s) / ED Diagnoses   Final diagnoses:  Cystitis  Thrombocytopenia [facility])    ED Discharge Orders        Ordered    cephALEXin (KEFLEX) 500 MG capsule  2 times daily     06/04/17 1446    ondansetron (ZOFRAN ODT) 4 MG disintegrating tablet  Every 8 hours PRN  06/04/17 1446       Derwood Kaplan, MD 06/04/17 1610

## 2017-06-04 NOTE — ED Notes (Addendum)
Administrator, Oscar Elliott, at [facility] contacted. Administrator to pick patient up at 7:30pm.

## 2017-06-04 NOTE — ED Notes (Signed)
EMS to transfer patient back to nursing home.

## 2017-06-04 NOTE — ED Triage Notes (Signed)
[facility]. Pt here for diarrhea for three days.

## 2017-06-04 NOTE — Discharge Instructions (Addendum)
THE RESULTS HERE SHOW THAT YOU HAVE A BLADDER INFECTION.  Please take the medications prescribed. Return to the ER if your symptoms get worse.

## 2017-06-04 NOTE — ED Notes (Signed)
EDP aware of CBC lab correction. No changes in orders made.

## 2017-06-07 LAB — URINE CULTURE

## 2017-06-08 ENCOUNTER — Telehealth: Payer: Self-pay

## 2017-06-08 NOTE — Telephone Encounter (Signed)
Post ED Visit - Positive Culture Follow-up  Culture report reviewed by antimicrobial stewardship pharmacist:  []  Oscar Elliott, Pharm.D. []  Oscar Elliott, Pharm.D., BCPS AQ-ID []  Oscar Elliott, Pharm.D., BCPS []  Oscar Elliott, Pharm.D., BCPS []  Oscar Elliott, [address]Ph[address]D., BCPS, AAHIVP []  Oscar Elliott, Pharm.D., BCPS, AAHIVP []  Oscar Elliott, PharmD, BCPS []  Oscar Elliott, PharmD []  Oscar Elliott, PharmD, BCPS  Oscar Elliott Pharm D Positive urine culture Treated with Cephalexin, organism sensitive to the same and no further patient follow-up is required at this time.  Jerry CarasCullom, Sheliah Fiorillo Burnett 06/08/2017, 10:46 AM

## 2017-06-17 ENCOUNTER — Telehealth (HOSPITAL_COMMUNITY): Payer: Self-pay | Admitting: Psychiatry

## 2017-06-17 NOTE — Telephone Encounter (Signed)
Dr Vanetta ShawlHisada Attempts have been made to follow up with group home setting concerning patient & labs. 1st attempt 04/29/17 & 2nd attempt on 06/12/17 was told by group home assistant that they would have home health company  Send lab results. 3rd attempt  06/17/17 No Answer

## 2017-06-17 NOTE — Telephone Encounter (Signed)
I don't think we have received any labs for clozapine. Could you contact with the patient/group home?

## 2017-06-25 ENCOUNTER — Telehealth (HOSPITAL_COMMUNITY): Payer: Self-pay

## 2017-06-25 ENCOUNTER — Other Ambulatory Visit (HOSPITAL_COMMUNITY): Payer: Self-pay | Admitting: Psychiatry

## 2017-06-25 MED ORDER — CLOZAPINE 100 MG PO TABS: ORAL_TABLET | ORAL | 0 refills | Status: DC

## 2017-06-25 NOTE — Telephone Encounter (Signed)
Patient needs a refill on Clozaril  tabs he will be out of this med in 24hours. Pharmacy is [facility] in [place]. Please advise

## 2017-06-25 NOTE — Telephone Encounter (Signed)
Contact the pharmacy- I ordered it this morning.

## 2017-06-25 NOTE — Telephone Encounter (Signed)
Received request for clozapine refill from [facility].  There is a copy of labs on 4/17 (ANC 6.1, Eos 0). Will order clozapine refill.

## 2017-06-26 NOTE — Telephone Encounter (Signed)
Spoke with pharmacy, they received prescrption. Per pharmacy will need a new script in 30 days.
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
## 2017-07-10 ENCOUNTER — Ambulatory Visit (INDEPENDENT_AMBULATORY_CARE_PROVIDER_SITE_OTHER): Payer: Medicare Other | Admitting: Psychiatry

## 2017-07-10 ENCOUNTER — Encounter (HOSPITAL_COMMUNITY): Payer: Self-pay | Admitting: Psychiatry

## 2017-07-10 ENCOUNTER — Telehealth (HOSPITAL_COMMUNITY): Payer: Self-pay | Admitting: Psychiatry

## 2017-07-10 VITALS — BP 120/80 | HR 82 | Ht 65.0 in | Wt 130.0 lb

## 2017-07-10 DIAGNOSIS — R450 Nervousness: Secondary | ICD-10-CM | POA: Diagnosis not present

## 2017-07-10 DIAGNOSIS — R51 Headache: Secondary | ICD-10-CM | POA: Diagnosis not present

## 2017-07-10 DIAGNOSIS — F419 Anxiety disorder, unspecified: Secondary | ICD-10-CM | POA: Diagnosis not present

## 2017-07-10 DIAGNOSIS — F17210 Nicotine dependence, cigarettes, uncomplicated: Secondary | ICD-10-CM | POA: Diagnosis not present

## 2017-07-10 DIAGNOSIS — F209 Schizophrenia, unspecified: Secondary | ICD-10-CM

## 2017-07-10 MED ORDER — BENZTROPINE MESYLATE 1 MG PO TABS: 1.0000 mg | ORAL_TABLET | Freq: Every day | ORAL | 0 refills | Status: DC

## 2017-07-10 MED ORDER — CLOZAPINE 100 MG PO TABS: ORAL_TABLET | ORAL | 0 refills | Status: DC

## 2017-07-10 MED ORDER — DULOXETINE HCL 60 MG PO CPEP: 120.0000 mg | ORAL_CAPSULE | Freq: Every day | ORAL | 0 refills | Status: DC

## 2017-07-10 MED ORDER — HYDROXYZINE HCL 25 MG PO TABS: 25.0000 mg | ORAL_TABLET | Freq: Two times a day (BID) | ORAL | 0 refills | Status: DC | PRN

## 2017-07-10 MED ORDER — ARIPIPRAZOLE 15 MG PO TABS: 15.0000 mg | ORAL_TABLET | Freq: Every day | ORAL | 0 refills | Status: DC

## 2017-07-10 NOTE — Patient Instructions (Signed)
1.ContinueClozapine 100 mg and 500 at night 2. Continue Abilify 15 mg daily  3. ContinueBenztropine 1 mg daily 4. ContinueDuloxetine 120 mg daily 5. ContinueHydroxyzine 25 mg twice a day 6. ContinueClonazepam 0.5 mg daily 7.Obtain record from [facility]- pending 8. Obtain lab (CBC) for monthly monitoring([facility] at [phone]) 9. Return to clinic in three months for 30 mins
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
## 2017-07-10 NOTE — Telephone Encounter (Signed)
Reviewed labs.  ANC [id number] on 07/09/2017

## 2017-07-17 ENCOUNTER — Telehealth (HOSPITAL_COMMUNITY): Payer: Self-pay | Admitting: *Deleted

## 2017-07-17 ENCOUNTER — Other Ambulatory Visit (HOSPITAL_COMMUNITY): Payer: Self-pay | Admitting: Psychiatry

## 2017-07-17 MED ORDER — CLONAZEPAM 0.5 MG PO TABS: 0.5000 mg | ORAL_TABLET | Freq: Every day | ORAL | 2 refills | Status: DC

## 2017-07-17 NOTE — Telephone Encounter (Signed)
ordered

## 2017-07-17 NOTE — Telephone Encounter (Signed)
Dr Vanetta Shawl  The pharmacy called asking for a refill on the Klonopin 0.5mg 

## 2017-08-08 ENCOUNTER — Encounter: Payer: Self-pay | Admitting: Psychiatry

## 2017-08-09 ENCOUNTER — Other Ambulatory Visit (HOSPITAL_COMMUNITY): Payer: Self-pay | Admitting: Psychiatry

## 2017-08-09 MED ORDER — CLOZAPINE 100 MG PO TABS: ORAL_TABLET | ORAL | 0 refills | Status: DC

## 2017-09-04 ENCOUNTER — Other Ambulatory Visit (HOSPITAL_COMMUNITY): Payer: Self-pay | Admitting: Psychiatry

## 2017-09-04 MED ORDER — CLOZAPINE 100 MG PO TABS: ORAL_TABLET | ORAL | 0 refills | Status: DC

## 2017-09-30 NOTE — Progress Notes (Signed)
BH MD/PA/NP OP Progress Note  10/14/2017 12:19 PM Oscar Elliott  MRN:  [id number]  Chief Complaint:  Chief Complaint    Schizophrenia; Follow-up     HPI:  Patient presents late for follow up appointment for schizophrenia. He states that "I have headache" ; he ruminates on this during the interview. When he is asked about his mood, he reports it is "alright." He has AH of voice, stating that "they will get rid of me." He answers "hmm" if he is able to ignore this voice. He then reports "If I don't have enough xxx, I don't know." He feels comfortable living at his current place. He is unable to repeat what he said. He enjoys smoking cigarette, walking outside. He feels depressed ("stay that way.") He has fair energy. He denies SI, HI, VH. He denies anxiety.   Per the transportation staff, the owner of group home denies any concern about the patient.   Wt Readings from Last 3 Encounters:  10/14/17 136 lb 6.4 oz (61.9 kg)  07/10/17 130 lb (59 kg)  06/04/17 135 lb (61.2 kg)    Per PMP,  Clonazepam filled on 09/13/2017    Visit Diagnosis:    ICD-10-CM   1. Schizophrenia, unspecified type (HCC) F20.9     Past Psychiatric History: Please see initial evaluation for full details. I have reviewed the history. No updates at this time.   Past Medical History:  Past Medical History:  Diagnosis Date  . CAD (coronary artery disease)    a. 10/2013 Lateral STEMI/PCI: LM nl, LAD min irregs, D1 occluded sub branch, LCX min irregs mid, OM large/patent, RCA 173m (2.75x28 Promus DES).  . Essential hypertension   . History of renal failure    a. 06/2007: in setting of rhabdo post L AKA.  Marland Kitchen Hyperlipidemia   . MI (myocardial infarction) (HCC)   . Peripheral vascular disease (HCC)    a. 06/2007: s/p L AKA @ [facility]  . Schizophrenia (HCC)   . Type 2 diabetes mellitus (HCC)     Past Surgical History:  Procedure Laterality Date  . Above-the-knee amputation Left   . LEFT HEART CATHETERIZATION WITH  CORONARY ANGIOGRAM N/A 11/20/2013   Procedure: LEFT HEART CATHETERIZATION WITH CORONARY ANGIOGRAM;  Surgeon: Corky Crafts, MD;  Location: [facility] CATH LAB;  Service: Cardiovascular;  Laterality: N/A;    Family Psychiatric History: Please see initial evaluation for full details. I have reviewed the history. No updates at this time.     Family History:  Family History  Problem Relation Age of Onset  . Other Unknown        no premature CAD.    Social History:  Social History   Socioeconomic History  . Marital status: Single    Spouse name: Not on file  . Number of children: Not on file  . Years of education: Not on file  . Highest education level: Not on file  Occupational History  . Not on file  Social Needs  . Financial resource strain: Not on file  . Food insecurity:    Worry: Not on file    Inability: Not on file  . Transportation needs:    Medical: Not on file    Non-medical: Not on file  Tobacco Use  . Smoking status: Current Every Day Smoker    Packs/day: 0.25    Years: 20.00    Pack years: 5.00    Types: Cigars, Cigarettes  . Smokeless tobacco: Never Used  Substance and Sexual Activity  .  Alcohol use: No    Alcohol/week: 0.0 standard drinks  . Drug use: No  . Sexual activity: Never  Lifestyle  . Physical activity:    Days per week: Not on file    Minutes per session: Not on file  . Stress: Not on file  Relationships  . Social connections:    Talks on phone: Not on file    Gets together: Not on file    Attends religious service: Not on file    Active member of club or organization: Not on file    Attends meetings of clubs or organizations: Not on file    Relationship status: Not on file  Other Topics Concern  . Not on file  Social History Narrative   Single, no children    Allergies:  Allergies  Allergen Reactions  . Penicillins Other (See Comments)    Reaction unknown    Metabolic Disorder Labs: Lab Results  Component Value Date    HGBA1C 5.8 (H) 02/23/2014   MPG 120 (H) 02/23/2014   No results found for: PROLACTIN Lab Results  Component Value Date   CHOL 102 11/21/2013   TRIG 66 11/21/2013   HDL 27 (L) 11/21/2013   CHOLHDL 3.8 11/21/2013   VLDL 13 11/21/2013   LDLCALC 62 11/21/2013   Lab Results  Component Value Date   TSH 1.490 11/21/2013    Therapeutic Level Labs: No results found for: LITHIUM No results found for: VALPROATE No components found for:  CBMZ  Current Medications: Current Outpatient Medications  Medication Sig Dispense Refill  . acetaminophen (TYLENOL) 500 MG tablet Take 500 mg by mouth every 4 (four) hours as needed for moderate pain or headache.    Marland Kitchen amLODipine (NORVASC) 5 MG tablet Take 5 mg by mouth daily.    . ARIPiprazole (ABILIFY) 15 MG tablet Take 1 tablet (15 mg total) by mouth daily. 90 tablet 0  . aspirin EC 81 MG tablet Take 81 mg by mouth daily.    Marland Kitchen atorvastatin (LIPITOR) 80 MG tablet Take 1 tablet (80 mg total) by mouth daily at 6 PM. 30 tablet 11  . benztropine (COGENTIN) 0.5 MG tablet Take 1 tablet (0.5 mg total) by mouth at bedtime. 90 tablet 0  . cephALEXin (KEFLEX) 500 MG capsule Take 1 capsule (500 mg total) by mouth 2 (two) times daily. 14 capsule 0  . clonazePAM (KLONOPIN) 0.5 MG tablet Take 1 tablet (0.5 mg total) by mouth at bedtime. 30 tablet 2  . clopidogrel (PLAVIX) 75 MG tablet Take 1 tablet (75 mg total) by mouth daily. 90 tablet 3  . cloZAPine (CLOZARIL) 100 MG tablet 100 mg in AM and 500 mg at night 180 tablet 0  . dextromethorphan-guaiFENesin (TUSSIN DM) 10-100 MG/5ML liquid Take 10 mLs by mouth every 4 (four) hours as needed for cough.    . docusate sodium (COLACE) 100 MG capsule Take 100 mg by mouth 2 (two) times daily.     . DULoxetine (CYMBALTA) 60 MG capsule Take 2 capsules (120 mg total) by mouth daily. 180 capsule 0  . insulin aspart (NOVOLOG) 100 UNIT/ML injection Inject 2 Units into the skin 3 (three) times daily before meals. 10 mL 11  . insulin  detemir (LEVEMIR) 100 UNIT/ML injection Inject 0.3 mLs (30 Units total) into the skin daily. 10 mL 11  . latanoprost (XALATAN) 0.005 % ophthalmic solution Place 1 drop into both eyes at bedtime.    . metoprolol (LOPRESSOR) 50 MG tablet Take 1 tablet (50 mg  total) by mouth 2 (two) times daily. 180 tablet 3  . mupirocin ointment (BACTROBAN) 2 % Place 1 application into the nose 3 (three) times daily.    . nitroGLYCERIN (NITROSTAT) 0.4 MG SL tablet Place 1 tablet (0.4 mg total) under the tongue every 5 (five) minutes x 3 doses as needed for chest pain. 25 tablet 12  . omeprazole (PRILOSEC) 40 MG capsule Take 40 mg by mouth 2 (two) times daily.     . ondansetron (ZOFRAN ODT) 4 MG disintegrating tablet Take 1 tablet (4 mg total) by mouth every 8 (eight) hours as needed for nausea or vomiting. 20 tablet 0  . tamsulosin (FLOMAX) 0.4 MG CAPS capsule Take 0.4 mg by mouth daily.      No current facility-administered medications for this visit.      Musculoskeletal: Strength & Muscle Tone: within normal limits Gait & Station: normal Patient leans: N/A  Psychiatric Specialty Exam: Review of Systems  Neurological: Positive for headaches.  Psychiatric/Behavioral: Positive for depression. Negative for hallucinations, memory loss, substance abuse and suicidal ideas. The patient is not nervous/anxious and does not have insomnia.   All other systems reviewed and are negative.   Blood pressure 120/81, pulse 88, height 5\' 5"  (1.651 m), weight 136 lb 6.4 oz (61.9 kg), SpO2 99 %.Body mass index is 22.7 kg/m.  General Appearance: Disheveled  Eye Contact:  Good  Speech:  Clear and Coherent  Volume:  Normal  Mood:  Depressed  Affect:  Restricted  Thought Process:  Descriptions of Associations: Tangential  Orientation:  Full (Time, Place, and Person)  Thought Content: Delusions   Suicidal Thoughts:  No  Homicidal Thoughts:  No  Memory:  Immediate;   Good  Judgement:  Fair  Insight:  Shallow   Psychomotor Activity:  Normal  Concentration:  Concentration: Good and Attention Span: Good  Recall:  Good  Fund of Knowledge: Good  Language: Good  Akathisia:  No  Handed:  Right  AIMS (if indicated): no rigidity, no tremors. + oral dyskinesia at times  Assets:  Communication Skills Desire for Improvement  ADL's:  Intact  Cognition: WNL  Sleep:  Good   Screenings:   Assessment and Plan:  Clance Bollndrew Konkle is a 57 y.o. year old male with a history of schizophrenia, CADwith history of DES to the RCA, PAD s/p left AKA, hypertension, hyperlipidemia , who presents for follow up appointment for Schizophrenia, unspecified type (HCC)  # Schizophrenia Although exam is notable for rumination on headache and tangential thought process, he is calm through the entire interview and staff denies any behavior issues since the last appointment.  Will continue clozapine at the current dose to target schizophrenia.  Discussed risks, which includes but not limited to constipation, metabolic side effect and agranulocytosis. Will continue Abilify for schizophrenia.  Although it is preferable to be on one antipsychotic, he is known to be stable on this current regimen without any significant side effect. Will taper down cogentin for EPS with plan to taper it off in the future to avoid polypharmacy.  Will continue duloxetine for depression.  Will continue clonazepam as needed for anxiety.  Will discontinue hydroxyzine given he has not received this medication for a while.   Plan I have reviewed and updated plans as below 1.ContinueClozapine 100 mg and 500 at night 2. Continue Abilify 15 mg daily  3. DecreaseBenztropine 0.5 mg daily 4. ContinueDuloxetine 120 mg daily 5. Discontinue hydroxyzine 6. ContinueClonazepam 0.5 mg daily 8. Obtain lab (CBC) for monthly  monitoring([facility] at [phone]) 9. Return to clinic in three months for 15 mins DOB 1960/12/26 on clozapine  registry  The patient demonstrates the following risk factors for suicide: Chronic risk factors for suicide include:psychiatric disorder ofschizophrenia. Acute risk factorsfor suicide include: unemployment. Protective factorsfor this patient include: positive social support. Considering these factors, the overall suicide risk at this point appears to below. Patientisappropriate for outpatient follow up.  The duration of this appointment visit was 30 minutes of face-to-face time with the patient.  Greater than 50% of this time was spent in counseling, explanation of  diagnosis, planning of further management, and coordination of care.  Neysa Hotter, MD 10/14/2017, 12:19 PM

## 2017-10-02 ENCOUNTER — Other Ambulatory Visit (HOSPITAL_COMMUNITY): Payer: Self-pay | Admitting: Psychiatry

## 2017-10-02 MED ORDER — CLOZAPINE 100 MG PO TABS: ORAL_TABLET | ORAL | 0 refills | Status: DC

## 2017-10-14 ENCOUNTER — Encounter (HOSPITAL_COMMUNITY): Payer: Self-pay | Admitting: Psychiatry

## 2017-10-14 ENCOUNTER — Ambulatory Visit (INDEPENDENT_AMBULATORY_CARE_PROVIDER_SITE_OTHER): Payer: Medicare Other | Admitting: Psychiatry

## 2017-10-14 VITALS — BP 120/81 | HR 88 | Ht 65.0 in | Wt 136.4 lb

## 2017-10-14 DIAGNOSIS — F209 Schizophrenia, unspecified: Secondary | ICD-10-CM | POA: Diagnosis not present

## 2017-10-14 DIAGNOSIS — R51 Headache: Secondary | ICD-10-CM | POA: Diagnosis not present

## 2017-10-14 DIAGNOSIS — F17210 Nicotine dependence, cigarettes, uncomplicated: Secondary | ICD-10-CM | POA: Diagnosis not present

## 2017-10-14 MED ORDER — BENZTROPINE MESYLATE 0.5 MG PO TABS: 0.5000 mg | ORAL_TABLET | Freq: Every day | ORAL | 0 refills | Status: DC

## 2017-10-14 MED ORDER — ARIPIPRAZOLE 15 MG PO TABS: 15.0000 mg | ORAL_TABLET | Freq: Every day | ORAL | 0 refills | Status: DC

## 2017-10-14 MED ORDER — DULOXETINE HCL 60 MG PO CPEP: 120.0000 mg | ORAL_CAPSULE | Freq: Every day | ORAL | 0 refills | Status: DC

## 2017-10-14 MED ORDER — CLONAZEPAM 0.5 MG PO TABS: 0.5000 mg | ORAL_TABLET | Freq: Every day | ORAL | 2 refills | Status: DC

## 2017-10-14 NOTE — Patient Instructions (Signed)
1.ContinueClozapine 100 mg and 500 at night 2. Continue Abilify 15 mg daily  3. DecreaseBenztropine 0.5 mg daily 4. ContinueDuloxetine 120 mg daily 5. Discontinue hydroxyzine 6. ContinueClonazepam 0.5 mg daily 8. Obtain lab (CBC) for monthly monitoring([facility] [phone]) 9. Return to clinic in three months for 15 mins

## 2017-11-19 ENCOUNTER — Ambulatory Visit (INDEPENDENT_AMBULATORY_CARE_PROVIDER_SITE_OTHER): Payer: Medicare Other | Admitting: Cardiology

## 2017-11-19 ENCOUNTER — Encounter: Payer: Self-pay | Admitting: Cardiology

## 2017-11-19 VITALS — BP 120/76 | HR 82 | Ht 61.0 in | Wt 135.0 lb

## 2017-11-19 DIAGNOSIS — I10 Essential (primary) hypertension: Secondary | ICD-10-CM

## 2017-11-19 DIAGNOSIS — I25119 Atherosclerotic heart disease of native coronary artery with unspecified angina pectoris: Secondary | ICD-10-CM | POA: Diagnosis not present

## 2017-11-19 DIAGNOSIS — E782 Mixed hyperlipidemia: Secondary | ICD-10-CM | POA: Diagnosis not present

## 2017-11-19 NOTE — Patient Instructions (Addendum)
Your physician wants you to follow-up in: 1 year  with Dr.McDowell You will receive a reminder letter in the mail two months in advance. If you don't receive a letter, please call our office to schedule the follow-up appointment.    Your physician recommends that you continue on your current medications as directed. Please refer to the Current Medication list given to you today.    If you need a refill on your cardiac medications before your next appointment, please call your pharmacy.      No lab work or tests due today      Thank you for choosing [facility] !

## 2017-11-19 NOTE — Progress Notes (Signed)
Cardiology Office Note  Date: 11/19/2017   ID: Oscar Elliott, DOB [birth year], MRN [id number]  PCP: Anselm Jungling, NP  Primary Cardiologist: Nona Dell, MD   Chief Complaint  Patient presents with  . Coronary Artery Disease    History of Present Illness: Oscar Elliott is a 57 y.o. male last seen in November 2018.  He is here today with an Geophysicist/field seismologist from his care home in [place].  Affect is blunted as before, but he does not report any progressive angina symptoms on medical therapy.  He has a prosthetic leg and uses a walker to ambulate.  I reviewed his medications, chart updated.  His current cardiac regimen includes aspirin, Lipitor Evex, Lopressor, and as needed nitroglycerin.  He had recent lab work per PCP, reviewed below.  I personally reviewed his ECG today which shows sinus rhythm with nonspecific ST-T wave abnormalities.  Echocardiogram from April of last year revealed mild LVH with LVEF approximately 50% and mild diastolic dysfunction.  Hypokinesis of the inferoseptal/inferior wall also noted.  Past Medical History:  Diagnosis Date  . CAD (coronary artery disease)    a. 10/2013 Lateral STEMI/PCI: LM nl, LAD min irregs, D1 occluded sub branch, LCX min irregs mid, OM large/patent, RCA 137m (2.75x28 Promus DES).  . Essential hypertension   . History of renal failure    a. 06/2007: in setting of rhabdo post L AKA.  Marland Kitchen Hyperlipidemia   . MI (myocardial infarction) (HCC)   . Peripheral vascular disease (HCC)    a. 06/2007: s/p L AKA @ [facility]  . Schizophrenia (HCC)   . Type 2 diabetes mellitus (HCC)     Past Surgical History:  Procedure Laterality Date  . Above-the-knee amputation Left   . LEFT HEART CATHETERIZATION WITH CORONARY ANGIOGRAM N/A 11/20/2013   Procedure: LEFT HEART CATHETERIZATION WITH CORONARY ANGIOGRAM;  Surgeon: Corky Crafts, MD;  Location: [facility] CATH LAB;  Service: Cardiovascular;  Laterality: N/A;    Current Outpatient Medications    Medication Sig Dispense Refill  . acetaminophen (TYLENOL) 500 MG tablet Take 500 mg by mouth every 4 (four) hours as needed for moderate pain or headache.    Marland Kitchen amLODipine (NORVASC) 5 MG tablet Take 5 mg by mouth daily.    . ARIPiprazole (ABILIFY) 15 MG tablet Take 1 tablet (15 mg total) by mouth daily. 90 tablet 0  . aspirin EC 81 MG tablet Take 81 mg by mouth daily.    Marland Kitchen atorvastatin (LIPITOR) 80 MG tablet Take 1 tablet (80 mg total) by mouth daily at 6 PM. 30 tablet 11  . benztropine (COGENTIN) 0.5 MG tablet Take 1 tablet (0.5 mg total) by mouth at bedtime. 90 tablet 0  . cholecalciferol (VITAMIN D) 1000 units tablet Take 2,000 Units by mouth once a week.    . clonazePAM (KLONOPIN) 0.5 MG tablet Take 1 tablet (0.5 mg total) by mouth at bedtime. 30 tablet 2  . clopidogrel (PLAVIX) 75 MG tablet Take 1 tablet (75 mg total) by mouth daily. 90 tablet 3  . cloZAPine (CLOZARIL) 100 MG tablet 100 mg in AM and 500 mg at night 180 tablet 0  . docusate sodium (COLACE) 100 MG capsule Take 100 mg by mouth 2 (two) times daily.     . DULoxetine (CYMBALTA) 60 MG capsule Take 2 capsules (120 mg total) by mouth daily. 180 capsule 0  . insulin aspart (NOVOLOG) 100 UNIT/ML injection Inject 2 Units into the skin 3 (three) times daily before meals. (Patient taking differently:  Inject 2 Units into the skin 3 (three) times daily before meals. If sugar is greater than 200 with meal- hold if not.) 10 mL 11  . insulin detemir (LEVEMIR) 100 UNIT/ML injection Inject 0.3 mLs (30 Units total) into the skin daily. (Patient taking differently: Inject 35 Units into the skin daily. Hold if pt does not eat .) 10 mL 11  . latanoprost (XALATAN) 0.005 % ophthalmic solution Place 1 drop into both eyes at bedtime.    . metoprolol (LOPRESSOR) 50 MG tablet Take 1 tablet (50 mg total) by mouth 2 (two) times daily. 180 tablet 3  . nitroGLYCERIN (NITROSTAT) 0.4 MG SL tablet Place 1 tablet (0.4 mg total) under the tongue every 5 (five)  minutes x 3 doses as needed for chest pain. 25 tablet 12  . omeprazole (PRILOSEC) 40 MG capsule Take 40 mg by mouth 2 (two) times daily.     . tamsulosin (FLOMAX) 0.4 MG CAPS capsule Take 0.4 mg by mouth daily.      No current facility-administered medications for this visit.    Allergies:  Penicillins   Social History: The patient  reports that he has been smoking cigars and cigarettes. He has a 5.00 pack-year smoking history. He has never used smokeless tobacco. He reports that he does not drink alcohol or use drugs.   ROS:  Please see the history of present illness. Otherwise, complete review of systems is positive for stable dyspnea on exertion.  All other systems are reviewed and negative.   Physical Exam: VS:  BP 120/76 (BP Location: Right Arm)   Pulse 82   Ht 5\' 1"  (1.549 m)   Wt 135 lb (61.2 kg)   SpO2 97%   BMI 25.51 kg/m , BMI Body mass index is 25.51 kg/m.  Wt Readings from Last 3 Encounters:  11/19/17 135 lb (61.2 kg)  06/04/17 135 lb (61.2 kg)  01/09/17 136 lb (61.7 kg)    General: Patient appears comfortable at rest.  Using walker. HEENT: Conjunctiva and lids normal, oropharynx clear. Neck: Supple, no elevated JVP or carotid bruits, no thyromegaly. Lungs: Clear to auscultation, nonlabored breathing at rest. Cardiac: Regular rate and rhythm, no S3, 2/6 systolic murmur. Abdomen: Soft, nontender, bowel sounds present. Extremities: Status post left AKA with prosthesis. Skin: Warm and dry.  ECG: I personally reviewed the tracing from 05/30/2016 which showed sinus rhythm with nonspecific T wave changes.  Recent Labwork: 06/04/2017: ALT 14; AST 17; BUN 20; Creatinine, Ser 1.18; Hemoglobin 14.2; Magnesium 2.3; Platelets 105; Potassium 3.3; Sodium 136     Component Value Date/Time   CHOL 102 11/21/2013 0123   TRIG 66 11/21/2013 0123   HDL 27 (L) 11/21/2013 0123   CHOLHDL 3.8 11/21/2013 0123   VLDL 13 11/21/2013 0123   LDLCALC 62 11/21/2013 0123  August 2019:  Hemoglobin 13.8, platelets 128, hemoglobin A1c 6.2  Other Studies Reviewed Today:   Echocardiogram 05/31/2016: Study Conclusions  - Left ventricle: The cavity size was normal. Wall thickness was   increased in a pattern of mild LVH. Systolic function was at the   lower limits of normal. The estimated ejection fraction was 50%.   Doppler parameters are consistent with abnormal left ventricular   relaxation (grade 1 diastolic dysfunction). - Regional wall motion abnormality: Mild hypokinesis of the mid   inferoseptal and basal-mid inferior myocardium.  Assessment and Plan:  1.  CAD with history of DES to the RCA in 2015.  We are managing him medically in the absence  of progressive angina symptoms.  Follow-up ECG reviewed with nonspecific ST-T changes.  Blood pressure and heart rate are well controlled today.  Continue with current regimen.  2.  Hyperlipidemia, on Lipitor.  He follows with PCP.  Last LDL was 62.  3.  Essential hypertension, blood pressure well controlled today on current regimen including Norvasc and Lopressor.  Current medicines were reviewed with the patient today.   Orders Placed This Encounter  Procedures  . EKG 12-Lead    Disposition: Follow-up in 1 year.  Signed, Jonelle SidleSamuel G. McDowell, MD, [facility] 11/19/2017 11:58 AM    [facility] [address], [place], [place] [address] Phone: [phone]; Fax: [phone]

## 2017-11-21 ENCOUNTER — Telehealth (HOSPITAL_COMMUNITY): Payer: Self-pay | Admitting: *Deleted

## 2017-11-21 NOTE — Telephone Encounter (Signed)
Called Group home # [phone] & left message with staff to obtain Blood test & that provider unable to refill without the test

## 2017-11-21 NOTE — Telephone Encounter (Signed)
[facility] requesting refill cloZAPine (CLOZARIL) 100 MG tablet 180 tablet 0 10/02/2017    Sig: 100 mg in AM and 500 mg at night

## 2017-11-21 NOTE — Telephone Encounter (Signed)
Please advise them to obtain blood test - I will not be able to do refill without the test.

## 2017-11-22 ENCOUNTER — Telehealth (HOSPITAL_COMMUNITY): Payer: Self-pay | Admitting: Psychiatry

## 2017-11-22 MED ORDER — CLOZAPINE 100 MG PO TABS: ORAL_TABLET | ORAL | 0 refills | Status: DC

## 2017-11-22 NOTE — Telephone Encounter (Signed)
Spoke with facility & has enough for a doseage today

## 2017-11-22 NOTE — Telephone Encounter (Signed)
Reviewed blood work, ordered clozapine. Please make sure with the facility that the patient has not missed any clozapine dose as he needs to start from lower dose to avoid significant side effect from clozapine if that is the case. Also please remind the facility to fax blood test result to our clinic monthly.

## 2017-11-28 ENCOUNTER — Other Ambulatory Visit (HOSPITAL_COMMUNITY): Payer: Self-pay | Admitting: Psychiatry

## 2017-11-28 MED ORDER — CLOZAPINE 100 MG PO TABS: ORAL_TABLET | ORAL | 0 refills | Status: DC

## 2017-12-19 ENCOUNTER — Telehealth: Payer: Self-pay | Admitting: Gastroenterology

## 2017-12-19 NOTE — Telephone Encounter (Signed)
CONTACT PT TO SCHEDULE TCS IF NOT PERFORMED WITHIN THE PAST 3 YRS.

## 2017-12-19 NOTE — Progress Notes (Signed)
REVIEWED-NO ADDITIONAL RECOMMENDATIONS. 

## 2017-12-19 NOTE — Telephone Encounter (Signed)
fowarding to susan/stacey to schedule OV to discuss scheduling TCS

## 2017-12-20 ENCOUNTER — Encounter: Payer: Self-pay | Admitting: Gastroenterology

## 2017-12-20 NOTE — Telephone Encounter (Signed)
PATIENT SCHEDULED AND LETTER SENT  °

## 2017-12-24 ENCOUNTER — Telehealth (HOSPITAL_COMMUNITY): Payer: Self-pay | Admitting: *Deleted

## 2017-12-24 ENCOUNTER — Other Ambulatory Visit (HOSPITAL_COMMUNITY): Payer: Self-pay | Admitting: Psychiatry

## 2017-12-24 MED ORDER — BENZTROPINE MESYLATE 0.5 MG PO TABS: 0.5000 mg | ORAL_TABLET | Freq: Every day | ORAL | 0 refills | Status: DC

## 2017-12-24 MED ORDER — CLOZAPINE 100 MG PO TABS: ORAL_TABLET | ORAL | 0 refills | Status: DC

## 2017-12-24 NOTE — Telephone Encounter (Signed)
Dr Vanetta Shawl [facility] called requesting refill on the benztropine (COGENTIN) 0.5 MG tablet

## 2017-12-24 NOTE — Telephone Encounter (Signed)
ordered

## 2018-01-07 NOTE — Progress Notes (Signed)
BH MD/PA/NP OP Progress Note  01/14/2018 11:33 AM Oscar Elliott  MRN:  [id number]  Chief Complaint:  Chief Complaint    Follow-up; Other     HPI:  He presents for follow-up appointment for schizophrenia.  He states that he has been feeling pretty good.  He does not do many things except making bed, when he is asked about his routine.  Although he likes to sew things, he has not done this for a while without any reason.  He feels safe at home.  He occasionally hears someone talking. He declines to elaborate it when he is asked, stating that "just talk," although he is unable to recall the name of this person. He denies VH. He denies feeling depressed. He sleeps well.  He has good appetite.  He has regular bowel movement.  He denies SI, HI.  He denies ideas of reference. He has occasional knee pain.   [facility] interview.  He has been doing well, stating that she would like to give him "best resident" awards if there is any.  She denies any safety concern. She has not noticed any change after discontinuing benztropine.   Wt Readings from Last 3 Encounters:  01/14/18 136 lb (61.7 kg)  11/19/17 135 lb (61.2 kg)  10/14/17 136 lb 6.4 oz (61.9 kg)    Visit Diagnosis:    ICD-10-CM   1. Schizophrenia, unspecified type (HCC) F20.9     Past Psychiatric History: Please see initial evaluation for full details. I have reviewed the history. No updates at this time.     Past Medical History:  Past Medical History:  Diagnosis Date  . CAD (coronary artery disease)    a. 10/2013 Lateral STEMI/PCI: LM nl, LAD min irregs, D1 occluded sub branch, LCX min irregs mid, OM large/patent, RCA 1567m (2.75x28 Promus DES).  . Essential hypertension   . History of renal failure    a. 06/2007: in setting of rhabdo post L AKA.  Marland Kitchen. Hyperlipidemia   . MI (myocardial infarction) (HCC)   . Peripheral vascular disease (HCC)    a. 06/2007: s/p L AKA @ [facility]  . Schizophrenia  (HCC)   . Type 2 diabetes mellitus (HCC)     Past Surgical History:  Procedure Laterality Date  . Above-the-knee amputation Left   . LEFT HEART CATHETERIZATION WITH CORONARY ANGIOGRAM N/A 11/20/2013   Procedure: LEFT HEART CATHETERIZATION WITH CORONARY ANGIOGRAM;  Surgeon: Corky CraftsJayadeep S Varanasi, MD;  [facility];  Service: Cardiovascular;  Laterality: N/A;    Family Psychiatric History: Please see initial evaluation for full details. I have reviewed the history. No updates at this time.     Family History:  Family History  Problem Relation Age of Onset  . Other Unknown        no premature CAD.    Social History:  Social History   Socioeconomic History  . Marital status: Single    Spouse name: Not on file  . Number of children: Not on file  . Years of education: Not on file  . Highest education level: Not on file  Occupational History  . Not on file  Social Needs  . Financial resource strain: Not on file  . Food insecurity:    Worry: Not on file    Inability: Not on file  . Transportation needs:    Medical: Not on file    Non-medical: Not on file  Tobacco Use  . Smoking status:  Current Every Day Smoker    Packs/day: 0.25    Years: 20.00    Pack years: 5.00    Types: Cigars, Cigarettes  . Smokeless tobacco: Never Used  Substance and Sexual Activity  . Alcohol use: No    Alcohol/week: 0.0 standard drinks  . Drug use: No  . Sexual activity: Never  Lifestyle  . Physical activity:    Days per week: Not on file    Minutes per session: Not on file  . Stress: Not on file  Relationships  . Social connections:    Talks on phone: Not on file    Gets together: Not on file    Attends religious service: Not on file    Active member of club or organization: Not on file    Attends meetings of clubs or organizations: Not on file    Relationship status: Not on file  Other Topics Concern  . Not on file  Social History Narrative   Single, no children     Allergies:  Allergies  Allergen Reactions  . Penicillins Other (See Comments)    Reaction unknown    Metabolic Disorder Labs: Lab Results  Component Value Date   HGBA1C 5.8 (H) 02/23/2014   MPG 120 (H) 02/23/2014   No results found for: PROLACTIN Lab Results  Component Value Date   CHOL 102 11/21/2013   TRIG 66 11/21/2013   HDL 27 (L) 11/21/2013   CHOLHDL 3.8 11/21/2013   VLDL 13 11/21/2013   LDLCALC 62 11/21/2013   Lab Results  Component Value Date   TSH 1.490 11/21/2013    Therapeutic Level Labs: No results found for: LITHIUM No results found for: VALPROATE No components found for:  CBMZ  Current Medications: Current Outpatient Medications  Medication Sig Dispense Refill  . acetaminophen (TYLENOL) 500 MG tablet Take 500 mg by mouth every 4 (four) hours as needed for moderate pain or headache.    Marland Kitchen amLODipine (NORVASC) 5 MG tablet Take 5 mg by mouth daily.    . ARIPiprazole (ABILIFY) 15 MG tablet Take 1 tablet (15 mg total) by mouth daily. 90 tablet 0  . aspirin EC 81 MG tablet Take 81 mg by mouth daily.    Marland Kitchen atorvastatin (LIPITOR) 80 MG tablet Take 1 tablet (80 mg total) by mouth daily at 6 PM. 30 tablet 11  . cholecalciferol (VITAMIN D) 1000 units tablet Take 2,000 Units by mouth once a week.    . clonazePAM (KLONOPIN) 0.5 MG tablet Take 1 tablet (0.5 mg total) by mouth at bedtime. 30 tablet 2  . clopidogrel (PLAVIX) 75 MG tablet Take 1 tablet (75 mg total) by mouth daily. 90 tablet 3  . cloZAPine (CLOZARIL) 100 MG tablet 100 mg in AM and 500 mg at night 180 tablet 0  . docusate sodium (COLACE) 100 MG capsule Take 100 mg by mouth 2 (two) times daily.     . DULoxetine (CYMBALTA) 60 MG capsule Take 2 capsules (120 mg total) by mouth daily. 180 capsule 0  . insulin aspart (NOVOLOG) 100 UNIT/ML injection Inject 2 Units into the skin 3 (three) times daily before meals. (Patient taking differently: Inject 2 Units into the skin 3 (three) times daily before meals. If  sugar is greater than 200 with meal- hold if not.) 10 mL 11  . insulin detemir (LEVEMIR) 100 UNIT/ML injection Inject 0.3 mLs (30 Units total) into the skin daily. (Patient taking differently: Inject 35 Units into the skin daily. Hold if pt does not  eat .) 10 mL 11  . latanoprost (XALATAN) 0.005 % ophthalmic solution Place 1 drop into both eyes at bedtime.    . metoprolol (LOPRESSOR) 50 MG tablet Take 1 tablet (50 mg total) by mouth 2 (two) times daily. 180 tablet 3  . nitroGLYCERIN (NITROSTAT) 0.4 MG SL tablet Place 1 tablet (0.4 mg total) under the tongue every 5 (five) minutes x 3 doses as needed for chest pain. 25 tablet 12  . omeprazole (PRILOSEC) 40 MG capsule Take 40 mg by mouth 2 (two) times daily.     . tamsulosin (FLOMAX) 0.4 MG CAPS capsule Take 0.4 mg by mouth daily.      No current facility-administered medications for this visit.      Musculoskeletal: Strength & Muscle Tone: within normal limits Gait & Station: normal Patient leans: N/A  Psychiatric Specialty Exam: Review of Systems  Psychiatric/Behavioral: Positive for hallucinations. Negative for depression, memory loss, substance abuse and suicidal ideas. The patient is not nervous/anxious and does not have insomnia.   All other systems reviewed and are negative.   Blood pressure 118/78, pulse 95, height 5\' 1"  (1.549 m), weight 136 lb (61.7 kg), SpO2 97 %.Body mass index is 25.7 kg/m.  General Appearance: Fairly Groomed  Eye Contact:  Good  Speech:  Clear and Coherent  Volume:  Normal  Mood:  "fine"  Affect:  Flat  Thought Process:  Coherent, tangential at times  Orientation:  Full (Time, Place, and Person)  Thought Content: Paranoid Ideation   Suicidal Thoughts:  No  Homicidal Thoughts:  No  Memory:  Immediate;   Good  Judgement:  Fair  Insight:  Present  Psychomotor Activity:  Normal  Concentration:  Concentration: Good and Attention Span: Good  Recall:  Good  Fund of Knowledge: Good  Language: Good   Akathisia:  No  Handed:  Right  AIMS (if indicated): no tremors, no rigidity. Oral Tardive dyskinesias   Assets:  Communication Skills Desire for Improvement  ADL's:  Intact  Cognition: WNL  Sleep:  Good   Screenings:   Assessment and Plan:  Oscar Elliott is a 57 y.o. year old male with a history of schizophrenia, CAD with history of DES to the RCA, PAD s/p left AKA, hypertension, hyperlipidemia  , who presents for follow up appointment for Schizophrenia, unspecified type (HCC)  # Schizophrenia He continues to have tangential thought process with occasional paranoia, which is consistent with initial visit.  Will continue clozapine at the current dose to target schizophrenia.  Discussed risk, which includes but not limited to constipation, metabolic side effect and agranulocytosis.  Will continue Abilify for schizophrenia.  Although it is preferable to be on one antipsychotics, he is known to be stable on current regimen without any significant side effects.  Will discontinue Cogentin to avoid polypharmacy.  Will continue to monitor oral dyskinesia.  Will continue duloxetine for depression.  Will continue clonazepam as needed for anxiety.   Plan I have reviewed and updated plans as below 1.ContinueClozapine 100 mg and 500 at night 2. Continue Abilify 15 mg daily  3. Discontinue benztropine 4. ContinueDuloxetine 120 mg daily 5. ContinueClonazepam 0.5 mg daily 8. Obtain lab (CBC) for monthly monitoring([facility] at [phone]) 9. Return to clinic in threemonths for 15 mins - he is advised to get lipid/glucose panels DOB 1960-07-17 on clozapine registry  The patient demonstrates the following risk factors for suicide: Chronic risk factors for suicide include:psychiatric disorder ofschizophrenia. Acute risk factorsfor suicide include: unemployment. Protective  factorsfor this patient include: positive social support. Considering these factors, the  overall suicide risk at this point appears to below. Patientisappropriate for outpatient follow up.   Neysa Hotter, MD 01/14/2018, 11:33 AM

## 2018-01-14 ENCOUNTER — Ambulatory Visit (INDEPENDENT_AMBULATORY_CARE_PROVIDER_SITE_OTHER): Payer: Medicare Other | Admitting: Psychiatry

## 2018-01-14 ENCOUNTER — Encounter (HOSPITAL_COMMUNITY): Payer: Self-pay | Admitting: Psychiatry

## 2018-01-14 VITALS — BP 118/78 | HR 95 | Ht 61.0 in | Wt 136.0 lb

## 2018-01-14 DIAGNOSIS — F209 Schizophrenia, unspecified: Secondary | ICD-10-CM | POA: Diagnosis not present

## 2018-01-14 MED ORDER — CLONAZEPAM 0.5 MG PO TABS: 0.5000 mg | ORAL_TABLET | Freq: Every day | ORAL | 2 refills | Status: DC

## 2018-01-14 MED ORDER — DULOXETINE HCL 60 MG PO CPEP: 120.0000 mg | ORAL_CAPSULE | Freq: Every day | ORAL | 0 refills | Status: DC

## 2018-01-14 MED ORDER — ARIPIPRAZOLE 15 MG PO TABS
15.0000 mg | ORAL_TABLET | Freq: Every day | ORAL | 0 refills | Status: DC
Start: 2018-01-14 — End: 2018-04-16

## 2018-01-14 NOTE — Patient Instructions (Signed)
1.ContinueClozapine 100 mg and 500 at night 2. Continue Abilify 15 mg daily  3. Discontinue benztropine 4. ContinueDuloxetine 120 mg daily 5. ContinueClonazepam 0.5 mg daily 8. Obtain lab (CBC) for monthly monitoring 9. Return to clinic in threemonths for 15 mins

## 2018-01-21 ENCOUNTER — Other Ambulatory Visit (HOSPITAL_COMMUNITY): Payer: Self-pay | Admitting: Psychiatry

## 2018-01-21 MED ORDER — CLOZAPINE 100 MG PO TABS: ORAL_TABLET | ORAL | 0 refills | Status: DC

## 2018-02-17 ENCOUNTER — Telehealth (HOSPITAL_COMMUNITY): Payer: Self-pay | Admitting: Psychiatry

## 2018-02-17 NOTE — Telephone Encounter (Signed)
Called [facility] to remind of Lab Work for Du PontCloazapine

## 2018-02-17 NOTE — Telephone Encounter (Signed)
Could you ensure the facility to get monthly blood draw hopefully within a few days so that I can order cloazapine in a timely manner given holidays.

## 2018-02-24 ENCOUNTER — Other Ambulatory Visit (HOSPITAL_COMMUNITY): Payer: Self-pay | Admitting: Psychiatry

## 2018-02-24 MED ORDER — CLOZAPINE 100 MG PO TABS: ORAL_TABLET | ORAL | 0 refills | Status: DC

## 2018-03-20 ENCOUNTER — Other Ambulatory Visit (HOSPITAL_COMMUNITY): Payer: Self-pay | Admitting: Psychiatry

## 2018-03-20 MED ORDER — CLOZAPINE 100 MG PO TABS: ORAL_TABLET | ORAL | 0 refills | Status: DC

## 2018-03-21 ENCOUNTER — Other Ambulatory Visit: Payer: Self-pay

## 2018-03-21 ENCOUNTER — Encounter: Payer: Self-pay | Admitting: Gastroenterology

## 2018-03-21 ENCOUNTER — Ambulatory Visit (INDEPENDENT_AMBULATORY_CARE_PROVIDER_SITE_OTHER): Payer: Medicare Other | Admitting: Gastroenterology

## 2018-03-21 VITALS — BP 111/75 | HR 90 | Temp 97.2°F | Ht 61.0 in | Wt 142.8 lb

## 2018-03-21 DIAGNOSIS — Z1211 Encounter for screening for malignant neoplasm of colon: Secondary | ICD-10-CM

## 2018-03-21 DIAGNOSIS — Z79899 Other long term (current) drug therapy: Secondary | ICD-10-CM | POA: Diagnosis not present

## 2018-03-21 MED ORDER — NA SULFATE-K SULFATE-MG SULF 17.5-3.13-1.6 GM/177ML PO SOLN: 1.0000 | ORAL | 0 refills | Status: DC

## 2018-03-21 NOTE — Assessment & Plan Note (Signed)
Very pleasant 58 year old gentleman presenting to schedule first ever colonoscopy.  He presented several years back but he had had recent coronary artery stenting and was on Brilinta which cannot be interrupted at the time.  Staff is unaware of any GI issues.  Patient's history is somewhat unreliable.  He reports having occasionally straining to have a bowel movement.  No other problems.  On exam he had some vague mild upper abdominal tenderness to palpation.  Otherwise unremarkable abdominal exam.  Plan for colonoscopy with Dr. Darrick Penna with deep sedation given polypharmacy. I have discussed the risks, alternatives, benefits with regards to but not limited to the risk of reaction to medication, bleeding, infection, perforation and the patient is agreeable to proceed. Written consent to be obtained.

## 2018-03-21 NOTE — Patient Instructions (Signed)
Colonoscopy as scheduled.  Please see separate instructions. 

## 2018-03-21 NOTE — Progress Notes (Addendum)
Primary Care Physician:  Anselm Jungling, NP  Primary Gastroenterologist:  [name], MD  REVIEWED-[facility] TCS AFTER JUN 1 DUE TO COVID 19 RESTRICTIONS.  Chief Complaint  Patient presents with  . Colonoscopy    never had tcs    HPI:  Oscar Elliott is a 58 y.o. male here to schedule first ever colonoscopy.  Patient was seen back in 2016 to schedule routine screening colonoscopy however he had recently had drug-eluting stent placement and it was felt to be too early to electively stop dual antiplatelet therapy for screening colonoscopy.  Patient never returned for follow-up.  He has resided in the same group home for over 40 years.FH not known.  Patient's history is somewhat unreliable per staff.  There is no reported weight loss.  Patient seems to be doing well.  No problems eating.  No vomiting.  No melena or rectal bleeding.  No bowel concerns.  Patient states sometimes he has to strain.  He has MiraLAX to use as needed but rarely has to take.  Current Outpatient Medications  Medication Sig Dispense Refill  . acetaminophen (TYLENOL) 500 MG tablet Take 500 mg by mouth every 4 (four) hours as needed for moderate pain or headache.    . ARIPiprazole (ABILIFY) 15 MG tablet Take 1 tablet (15 mg total) by mouth daily. 90 tablet 0  . aspirin EC 81 MG tablet Take 81 mg by mouth daily.    Marland Kitchen atorvastatin (LIPITOR) 80 MG tablet Take 1 tablet (80 mg total) by mouth daily at 6 PM. 30 tablet 11  . benztropine (COGENTIN) 0.5 MG tablet Take 0.5 mg by mouth at bedtime.    . cholecalciferol (VITAMIN D) 1000 units tablet Take 2,000 Units by mouth once a week.    . clonazePAM (KLONOPIN) 0.5 MG tablet Take 1 tablet (0.5 mg total) by mouth at bedtime. 30 tablet 2  . clopidogrel (PLAVIX) 75 MG tablet Take 1 tablet (75 mg total) by mouth daily. 90 tablet 3  . [START ON 03/25/2018] cloZAPine (CLOZARIL) 100 MG tablet 100 mg in AM and 500 mg at night 180 tablet 0  . docusate sodium (COLACE) 100 MG capsule Take 100 mg  by mouth 2 (two) times daily.     . DULoxetine (CYMBALTA) 60 MG capsule Take 2 capsules (120 mg total) by mouth daily. 180 capsule 0  . insulin aspart (NOVOLOG) 100 UNIT/ML injection Inject 2 Units into the skin 3 (three) times daily before meals. (Patient taking differently: Inject 2 Units into the skin 3 (three) times daily before meals. If sugar is greater than 200 with meal- hold if not.) 10 mL 11  . insulin detemir (LEVEMIR) 100 UNIT/ML injection Inject 0.3 mLs (30 Units total) into the skin daily. (Patient taking differently: Inject 35 Units into the skin daily. Hold if pt does not eat .) 10 mL 11  . latanoprost (XALATAN) 0.005 % ophthalmic solution Place 1 drop into both eyes at bedtime.    Marland Kitchen lisinopril (PRINIVIL,ZESTRIL) 20 MG tablet Take 20 mg by mouth daily.    . metoprolol (LOPRESSOR) 50 MG tablet Take 1 tablet (50 mg total) by mouth 2 (two) times daily. 180 tablet 3  . nitroGLYCERIN (NITROSTAT) 0.4 MG SL tablet Place 1 tablet (0.4 mg total) under the tongue every 5 (five) minutes x 3 doses as needed for chest pain. 25 tablet 12  . omeprazole (PRILOSEC) 40 MG capsule Take 40 mg by mouth 2 (two) times daily.     . tamsulosin (FLOMAX) 0.4  MG CAPS capsule Take 0.4 mg by mouth daily.     Marland Kitchen amLODipine (NORVASC) 5 MG tablet Take 5 mg by mouth daily.     No current facility-administered medications for this visit.     Allergies as of 03/21/2018 - Review Complete 03/21/2018  Allergen Reaction Noted  . Penicillins Other (See Comments) 11/20/2013    Past Medical History:  Diagnosis Date  . CAD (coronary artery disease)    a. 10/2013 Lateral STEMI/PCI: LM nl, LAD min irregs, D1 occluded sub branch, LCX min irregs mid, OM large/patent, RCA 125m (2.75x28 Promus DES).  . Essential hypertension   . History of renal failure    a. 06/2007: in setting of rhabdo post L AKA.  Marland Kitchen Hyperlipidemia   . MI (myocardial infarction) (HCC)   . Peripheral vascular disease (HCC)    a. 06/2007: s/p L AKA @ [facility]   . Schizophrenia (HCC)   . Type 2 diabetes mellitus (HCC)     Past Surgical History:  Procedure Laterality Date  . Above-the-knee amputation Left   . LEFT HEART CATHETERIZATION WITH CORONARY ANGIOGRAM N/A 11/20/2013   Procedure: LEFT HEART CATHETERIZATION WITH CORONARY ANGIOGRAM;  Surgeon: Corky Crafts, MD;  Location: [facility] CATH LAB;  Service: Cardiovascular;  Laterality: N/A;    Family History  Problem Relation Age of Onset  . Other Unknown        no premature CAD.    Social History   Socioeconomic History  . Marital status: Single    Spouse name: Not on file  . Number of children: Not on file  . Years of education: Not on file  . Highest education level: Not on file  Occupational History  . Not on file  Social Needs  . Financial resource strain: Not on file  . Food insecurity:    Worry: Not on file    Inability: Not on file  . Transportation needs:    Medical: Not on file    Non-medical: Not on file  Tobacco Use  . Smoking status: Current Every Day Smoker    Packs/day: 0.25    Years: 20.00    Pack years: 5.00    Types: Cigars  . Smokeless tobacco: Never Used  Substance and Sexual Activity  . Alcohol use: No    Alcohol/week: 0.0 standard drinks  . Drug use: No  . Sexual activity: Never  Lifestyle  . Physical activity:    Days per week: Not on file    Minutes per session: Not on file  . Stress: Not on file  Relationships  . Social connections:    Talks on phone: Not on file    Gets together: Not on file    Attends religious service: Not on file    Active member of club or organization: Not on file    Attends meetings of clubs or organizations: Not on file    Relationship status: Not on file  . Intimate partner violence:    Fear of current or ex partner: Not on file    Emotionally abused: Not on file    Physically abused: Not on file    Forced sexual activity: Not on file  Other Topics Concern  . Not on file  Social History Narrative   Single, no  children      ROS: Questionable reliability.  General: Negative for anorexia, weight loss, fever, chills, fatigue, weakness. Eyes: Negative for vision changes.  ENT: Negative for hoarseness, difficulty swallowing , nasal congestion. CV: Negative for  chest pain, angina, palpitations, dyspnea on exertion, peripheral edema.  Respiratory: Negative for dyspnea at rest, dyspnea on exertion, cough, sputum, wheezing.  GI: See history of present illness. GU:  Negative for dysuria, hematuria, urinary incontinence, urinary frequency, nocturnal urination.  MS: Negative for joint pain, low back pain.  Derm: Negative for rash or itching.  Neuro: Negative for weakness, abnormal sensation, seizure, frequent headaches, memory loss, confusion.  Psych: Negative for anxiety, depression, suicidal ideation, hallucinations.  Endo: Negative for unusual weight change.  Heme: Negative for bruising or bleeding. Allergy: Negative for rash or hives.    Physical Examination:  BP 111/75   Pulse 90   Temp (!) 97.2 F (36.2 C) (Oral)   Ht 5\' 1"  (1.549 m)   Wt 142 lb 12.8 oz (64.8 kg)   BMI 26.98 kg/m    General: Well-nourished, well-developed in no acute distress.  Head: Normocephalic, atraumatic.   Eyes: Conjunctiva pink, no icterus. Mouth: Oropharyngeal mucosa moist and pink , no lesions erythema or exudate. Neck: Supple without thyromegaly, masses, or lymphadenopathy.  Lungs: Clear to auscultation bilaterally.  Heart: Regular rate and rhythm, no murmurs rubs or gallops.  Abdomen: Bowel sounds are normal, minimal upper abdominal tenderness, nondistended, no hepatosplenomegaly or masses, no abdominal bruits or    hernia , no rebound or guarding.   Rectal: Deferred Extremities: No lower extremity edema. No clubbing or deformities.  Neuro: Alert and oriented x 4 , grossly normal neurologically.  Skin: Warm and dry, no rash or jaundice.   Psych: Alert and cooperative, normal mood and affect.  Labs: Lab  Results  Component Value Date   CREATININE 1.18 06/04/2017   BUN 20 06/04/2017   NA 136 06/04/2017   K 3.3 (L) 06/04/2017   CL 102 06/04/2017   CO2 24 06/04/2017   Lab Results  Component Value Date   WBC 4.5 06/04/2017   HGB 14.2 06/04/2017   HCT 40.5 06/04/2017   MCV 83.2 06/04/2017   PLT 105 (L) 06/04/2017   Lab Results  Component Value Date   ALT 14 (L) 06/04/2017   AST 17 06/04/2017   ALKPHOS 55 06/04/2017   BILITOT 0.7 06/04/2017    July 2019, ferritin 62, hemoglobin 14.1 Imaging Studies:, Iron 72, iron saturations 30%,  No results found.

## 2018-03-24 NOTE — Progress Notes (Signed)
CC'D TO PCP °

## 2018-03-26 ENCOUNTER — Telehealth (HOSPITAL_COMMUNITY): Payer: Self-pay | Admitting: *Deleted

## 2018-03-26 NOTE — Telephone Encounter (Signed)
Notified [facility] Cogentin per provider: It is discontinued

## 2018-03-26 NOTE — Telephone Encounter (Signed)
It is discontinued

## 2018-03-26 NOTE — Telephone Encounter (Signed)
Dr Vanetta Shawl [facility] called for refill on Cogentin

## 2018-04-10 NOTE — Progress Notes (Signed)
[place] MD/PA/NP OP Progress Note  04/16/2018 11:35 AM Oscar Elliott  MRN:  [id number]  Chief Complaint:  Chief Complaint    Schizophrenia; Follow-up     HPI:  - patient is planning to get colonoscopy Patient presents for follow-up appointment for schizophrenia.  He states that he cursed out for no reason.  There is "nobody" to curse out when he was asked about the situation.  He states that somebody urinated in his room.  He also feels that somebody is trying to kill the patient.  He later asked this note Probation officer whether this Multimedia programmer could find the one for trying to kill the patient.  He is redirected and agrees to talk with the staff if he feels unsafe.  He has insomnia.  He feels depressed at times.  He has fair motivation.  He denies SI.  He feels anxious and tense at times.  He denies HI, AH, VH. Last bowel movement yesterday.  He has occasional abdominal pain.  He is planning to get colonoscopy.   [facility] presents to the interview.  There has been no change in Oscar Elliott.  He now has his own room.  Although he appears to hear some voices at times, there has been no safety concern.  No behavior concern. He continues to sew (although the patient denies it).    Clonazepam filled on 03/19/2018   Wt Readings from Last 3 Encounters:  04/16/18 139 lb (63 kg)  03/21/18 142 lb 12.8 oz (64.8 kg)  01/14/18 136 lb (61.7 kg)    Visit Diagnosis:    ICD-10-CM   1. Schizophrenia, unspecified type (Oscar Elliott) F20.9 CBC with Differential/Platelet    Past Psychiatric History: Please see initial evaluation for full details. I have reviewed the history. No updates at this time.     Past Medical History:  Past Medical History:  Diagnosis Date  . CAD (coronary artery disease)    a. 10/2013 Lateral STEMI/PCI: LM nl, LAD min irregs, D1 occluded sub branch, LCX min irregs mid, OM large/patent, RCA 179m(2.75x28 Promus DES).  . Cholelithiasis   . Essential hypertension   . History  of renal failure    a. 06/2007: in setting of rhabdo post L AKA.  .Marland KitchenHyperlipidemia   . [place]   . Nephrolithiasis   . [place] vascular disease ([place]    a. 06/2007: s/p L AKA [facility]  . [place]ia ([place]   . Type 2 diabetes mellitus ([place]     Past Surgical History:  Procedure Laterality Date  . Above-the-knee amputation Left   . LEFT HEART CATHETERIZATION WITH CORONARY ANGIOGRAM N/A 11/20/2013   Procedure: LEFT HEART CATHETERIZATION WITH CORONARY ANGIOGRAM;  Surgeon: JJettie Booze MD;  [facility];  Service: Cardiovascular;  Laterality: N/A;    Family Psychiatric History: Please see initial evaluation for full details. I have reviewed the history. No updates at this time.     Family History:  Family History  Problem Relation Age of Onset  . Other Other        no premature CAD.    Social History:  Social History   Socioeconomic History  . Marital status: Single    Spouse name: Not on file  . Number of children: Not on file  . Years of education: Not on file  . Highest education level: Not on file  Occupational History  . Not on file  Social Needs  . Financial resource strain: Not  on file  . Food insecurity:    Worry: Not on file    Inability: Not on file  . Transportation needs:    Medical: Not on file    Non-medical: Not on file  Tobacco Use  . Smoking status: Current Every Day Smoker    Packs/day: 0.25    Years: 20.00    Pack years: 5.00    Types: Cigars  . Smokeless tobacco: Never Used  Substance and Sexual Activity  . Alcohol use: No    Alcohol/week: 0.0 standard drinks  . Drug use: No  . Sexual activity: Never  Lifestyle  . Physical activity:    Days per week: Not on file    Minutes per session: Not on file  . Stress: Not on file  Relationships  . Social connections:    Talks on phone: Not on file    Gets together: Not on file    Attends religious service: Not on file    Active member of club or  organization: Not on file    Attends meetings of clubs or organizations: Not on file    Relationship status: Not on file  Other Topics Concern  . Not on file  Social History Narrative   Single, no children    Allergies:  Allergies  Allergen Reactions  . Penicillins Other (See Comments)    Reaction unknown    Metabolic Disorder Labs: Lab Results  Component Value Date   HGBA1C 5.8 (H) 02/23/2014   MPG 120 (H) 02/23/2014   No results found for: PROLACTIN Lab Results  Component Value Date   CHOL 102 11/21/2013   TRIG 66 11/21/2013   HDL 27 (L) 11/21/2013   CHOLHDL 3.8 11/21/2013   VLDL 13 11/21/2013   LDLCALC 62 11/21/2013   Lab Results  Component Value Date   TSH 1.490 11/21/2013    Therapeutic Level Labs: No results found for: LITHIUM No results found for: VALPROATE No components found for:  CBMZ  Current Medications: Current Outpatient Medications  Medication Sig Dispense Refill  . acetaminophen (TYLENOL) 500 MG tablet Take 500 mg by mouth every 4 (four) hours as needed for moderate pain or headache.    . ARIPiprazole (ABILIFY) 15 MG tablet Take 1 tablet (15 mg total) by mouth daily. 90 tablet 0  . aspirin EC 81 MG tablet Take 81 mg by mouth daily.    Marland Kitchen atorvastatin (LIPITOR) 80 MG tablet Take 1 tablet (80 mg total) by mouth daily at 6 PM. 30 tablet 11  . cholecalciferol (VITAMIN D) 1000 units tablet Take 2,000 Units by mouth once a week.    . clonazePAM (KLONOPIN) 0.5 MG tablet Take 1 tablet (0.5 mg total) by mouth at bedtime. 30 tablet 2  . clopidogrel (PLAVIX) 75 MG tablet Take 1 tablet (75 mg total) by mouth daily. 90 tablet 3  . [START ON 04/18/2018] cloZAPine (CLOZARIL) 100 MG tablet 100 mg in AM and 500 mg at night 180 tablet 0  . docusate sodium (COLACE) 100 MG capsule Take 100 mg by mouth 2 (two) times daily.     . DULoxetine (CYMBALTA) 60 MG capsule Take 2 capsules (120 mg total) by mouth daily. 180 capsule 0  . insulin aspart (NOVOLOG) 100 UNIT/ML  injection Inject 2 Units into the skin 3 (three) times daily before meals. (Patient taking differently: Inject 2 Units into the skin 3 (three) times daily before meals. If sugar is greater than 200 with meal- hold if not.) 10 mL 11  .  insulin detemir (LEVEMIR) 100 UNIT/ML injection Inject 0.3 mLs (30 Units total) into the skin daily. (Patient taking differently: Inject 35 Units into the skin daily. Hold if pt does not eat .) 10 mL 11  . latanoprost (XALATAN) 0.005 % ophthalmic solution Place 1 drop into both eyes at bedtime.    Marland Kitchen lisinopril (PRINIVIL,ZESTRIL) 20 MG tablet Take 20 mg by mouth daily.    . metoprolol (LOPRESSOR) 50 MG tablet Take 1 tablet (50 mg total) by mouth 2 (two) times daily. 180 tablet 3  . Na Sulfate-K Sulfate-Mg Sulf (SUPREP BOWEL PREP KIT) 17.5-3.13-1.6 GM/177ML SOLN Take 1 kit by mouth as directed. 1 Bottle 0  . nitroGLYCERIN (NITROSTAT) 0.4 MG SL tablet Place 1 tablet (0.4 mg total) under the tongue every 5 (five) minutes x 3 doses as needed for chest pain. 25 tablet 12  . omeprazole (PRILOSEC) 40 MG capsule Take 40 mg by mouth 2 (two) times daily.     . tamsulosin (FLOMAX) 0.4 MG CAPS capsule Take 0.4 mg by mouth daily.      No current facility-administered medications for this visit.      Musculoskeletal: Strength & Muscle Tone: within normal limits Gait & Station: normal Patient leans: N/A  Psychiatric Specialty Exam: Review of Systems  Psychiatric/Behavioral: Positive for depression. Negative for hallucinations, memory loss, substance abuse and suicidal ideas. The patient is nervous/anxious and has insomnia.   All other systems reviewed and are negative.   Blood pressure 108/70, pulse 90, height 5' 1"  (1.549 m), weight 139 lb (63 kg), SpO2 97 %.Body mass index is 26.26 kg/m.  General Appearance: Fairly Groomed  Eye Contact:  Good  Speech:  Clear and Coherent  Volume:  Normal  Mood:  "fine"  Affect:  Blunt  Thought Process:  Coherent  Orientation:  Full  (Time, Place, and Person)  Thought Content: Paranoid Ideation   Suicidal Thoughts:  No  Homicidal Thoughts:  No  Memory:  Immediate;   Good  Judgement:  Fair  Insight:  Lacking  Psychomotor Activity:  Normal  Concentration:  Concentration: Good and Attention Span: Good  Recall:  Good  Fund of Knowledge: Good  Language: Good  Akathisia:  No  Handed:  Right  AIMS (if indicated): no rigidity, no tremors  Assets:  Communication Skills Desire for Improvement  ADL's:  Intact  Cognition: WNL  Sleep:  Fair   Screenings:   Assessment and Plan:  Oscar Elliott is a 58 y.o. year old male with a history of schizophrenia, , CAD with history of DES to the RCA, PAD s/p left AKA, hypertension, hyperlipidemia, who presents for follow up appointment for Schizophrenia, unspecified type ([place]) - Plan: CBC with Differential/Platelet  # Schizophrenia Although patient demonstrates some rumination on paranoia, the staff denies significant difference from his baseline.  Will continue clozapine at the current dose to target schizophrenia.  Discussed risks, which includes but not limited to constipation, metabolic side effect and are granulocytosis.  Will continue Abilify for schizophrenia.  Although it is preferable to be on one antipsychotics, he will benefit from this combination of this medication to avoid relapse in his symptoms.  Will continue duloxetine for depression.  Will continue clonazepam as needed for anxiety.  Discussed risk of oversedation.   Plan I have reviewed and updated plans as below 1.ContinueClozapine 100 mg and 500 at night 2. Continue Abilify 15 mg daily  4. ContinueDuloxetine 120 mg daily 5. ContinueClonazepam 0.5 mg daily 8. Obtain lab (CBC) for monthly monitoring(Ms. Garfield Cornea  [facility] at [phone]) 9. Return to clinic in threemonths for40mns - he is advised to get lipid/glucose panels  The patient demonstrates the following risk factors for suicide:  Chronic risk factors for suicide include:psychiatric disorder ofschizophrenia. Acute risk factorsfor suicide include: unemployment. Protective factorsfor this patient include: positive social support. Considering these factors, the overall suicide risk at this point appears to below. Patientisappropriate for outpatient follow up.   RNorman Clay MD 04/16/2018, 11:35 AM

## 2018-04-15 ENCOUNTER — Other Ambulatory Visit (HOSPITAL_COMMUNITY): Payer: Self-pay | Admitting: Psychiatry

## 2018-04-15 MED ORDER — CLOZAPINE 100 MG PO TABS: ORAL_TABLET | ORAL | 0 refills | Status: DC

## 2018-04-16 ENCOUNTER — Ambulatory Visit (INDEPENDENT_AMBULATORY_CARE_PROVIDER_SITE_OTHER): Payer: Medicare Other | Admitting: Psychiatry

## 2018-04-16 VITALS — BP 108/70 | HR 90 | Ht 61.0 in | Wt 139.0 lb

## 2018-04-16 DIAGNOSIS — F209 Schizophrenia, unspecified: Secondary | ICD-10-CM

## 2018-04-16 MED ORDER — CLONAZEPAM 0.5 MG PO TABS: 0.5000 mg | ORAL_TABLET | Freq: Every day | ORAL | 2 refills | Status: DC

## 2018-04-16 MED ORDER — DULOXETINE HCL 60 MG PO CPEP: 120.0000 mg | ORAL_CAPSULE | Freq: Every day | ORAL | 0 refills | Status: DC

## 2018-04-16 MED ORDER — ARIPIPRAZOLE 15 MG PO TABS: 15.0000 mg | ORAL_TABLET | Freq: Every day | ORAL | 0 refills | Status: DC

## 2018-04-16 NOTE — Patient Instructions (Signed)
1.ContinueClozapine 100 mg and 500 at night 2. Continue Abilify 15 mg daily  4. ContinueDuloxetine 120 mg daily 5. ContinueClonazepam 0.5 mg daily 8. Obtain lab (CBC) for monthly monitoring 9. Return to clinic in threemonths for17mins

## 2018-05-13 ENCOUNTER — Encounter: Payer: Self-pay | Admitting: Psychiatry

## 2018-05-15 ENCOUNTER — Other Ambulatory Visit: Payer: Self-pay | Admitting: Cardiology

## 2018-05-15 ENCOUNTER — Other Ambulatory Visit (HOSPITAL_COMMUNITY): Payer: Self-pay | Admitting: Psychiatry

## 2018-05-15 MED ORDER — CLOZAPINE 100 MG PO TABS: ORAL_TABLET | ORAL | 0 refills | Status: DC

## 2018-06-03 ENCOUNTER — Telehealth: Payer: Self-pay | Admitting: *Deleted

## 2018-06-03 NOTE — Progress Notes (Signed)
See TE encounter 06/03/2018 

## 2018-06-03 NOTE — Telephone Encounter (Signed)
Per [facility]: REVIEWED-[facility] TCS AFTER JUN 1 DUE TO COVID 19 RESTRICTIONS. 

## 2018-06-03 NOTE — Telephone Encounter (Addendum)
Called patient, spoke with caregiver and he  is aware procedure will be rescheduled. he voiced understanding. Will call once we received July schedule.

## 2018-06-04 NOTE — Telephone Encounter (Signed)
Spoke with caregiver and is aware procedure r/s'd to 7/28 at 7:30am. Will mail new instructions/new pre-op appt in mail.

## 2018-06-05 ENCOUNTER — Other Ambulatory Visit: Payer: Self-pay

## 2018-06-05 MED ORDER — CLOPIDOGREL BISULFATE 75 MG PO TABS: ORAL_TABLET | ORAL | 2 refills | Status: DC

## 2018-06-05 NOTE — Telephone Encounter (Signed)
Requested 90 day plavix refill from [facility], yet [facility] fills patient's med in pillpack for nursing home

## 2018-06-10 ENCOUNTER — Other Ambulatory Visit (HOSPITAL_COMMUNITY): Payer: Self-pay

## 2018-06-12 ENCOUNTER — Other Ambulatory Visit (HOSPITAL_COMMUNITY): Payer: Self-pay | Admitting: Psychiatry

## 2018-06-12 MED ORDER — CLOZAPINE 100 MG PO TABS: ORAL_TABLET | ORAL | 0 refills | Status: DC

## 2018-07-08 ENCOUNTER — Encounter: Payer: Self-pay | Admitting: Psychiatry

## 2018-07-10 NOTE — Progress Notes (Signed)
[facility] MD/PA/NP OP Progress Note  07/15/2018 10:28 AM Oscar Elliott  MRN:  [id number]  Chief Complaint:  Chief Complaint    Follow-up; Schizophrenia     HPI:  This is a follow-up visit for schizophrenia.  He states that he has been doing very well.  He enjoys cleaning the room and making art crafts.  He sits in the porch, although it is difficult for the patient to walk around.  He has mild chest pain and headache for the past 2 weeks.  He asked some medication to be prescribed.  He has bowel movement a few times per week.  He denies feeling depressed.  He denies anxiety.  He denies SI.  He denies hallucinations, HI.  He sleeps well at night.  He has good appetite.   Staff at [facility] presents to the interview.  There has been no change in Oscar Elliott.  No safety concern. He takes medication regularly.  Colonoscopy was rescheduled due to [facility] pandemic.  The staff agrees to follow up Oscar Elliott's constipation with his provider.   Weight 140 lbs according to the staff Wt Readings from Last 3 Encounters:  04/16/18 139 lb (63 kg)  03/21/18 142 lb 12.8 oz (64.8 kg)  01/14/18 136 lb (61.7 kg)    Visit Diagnosis:    ICD-10-CM   1. Schizophrenia, unspecified type ([place]) F20.9 CBC with Differential/Platelet    Past Psychiatric History: Please see initial evaluation for full details. I have reviewed the history. No updates at this time.     Past Medical History:  Past Medical History:  Diagnosis Date  . CAD (coronary artery disease)    a. 10/2013 Lateral STEMI/PCI: LM nl, LAD min irregs, D1 occluded sub branch, LCX min irregs mid, OM large/patent, RCA 127m(2.75x28 Promus DES).  . Cholelithiasis   . Essential hypertension   . History of renal failure    a. 06/2007: in setting of rhabdo post L AKA.  .Marland KitchenHyperlipidemia   . [facility] ([facility]   . Nephrolithiasis   . Peripheral vascular disease ([facility]    a. 06/2007: s/p L AKA [facility]  . Schizophrenia ([place] [place]   . Type 2  diabetes mellitus ([place]     Past Surgical History:  Procedure Laterality Date  . Above-the-knee amputation Left   . LEFT HEART CATHETERIZATION WITH CORONARY ANGIOGRAM N/A 11/20/2013   Procedure: LEFT HEART CATHETERIZATION WITH CORONARY ANGIOGRAM;  Surgeon: JJettie Booze MD;  [facility];  Service: Cardiovascular;  Laterality: N/A;    Family Psychiatric History: Please see initial evaluation for full details. I have reviewed the history. No updates at this time.     Family History:  Family History  Problem Relation Age of Onset  . Other Other        no premature CAD.    Social History:  Social History   Socioeconomic History  . Marital status: Single    Spouse name: Not on file  . Number of children: Not on file  . Years of education: Not on file  . Highest education level: Not on file  Occupational History  . Not on file  Social Needs  . Financial resource strain: Not on file  . Food insecurity:    Worry: Not on file    Inability: Not on file  . Transportation needs:    Medical: Not on file    Non-medical: Not on file  Tobacco Use  . Smoking status: Current Every Day Smoker  Packs/day: 0.25    Years: 20.00    Pack years: 5.00    Types: Cigars  . Smokeless tobacco: Never Used  Substance and Sexual Activity  . Alcohol use: No    Alcohol/week: 0.0 standard drinks  . Drug use: No  . Sexual activity: Never  Lifestyle  . Physical activity:    Days per week: Not on file    Minutes per session: Not on file  . Stress: Not on file  Relationships  . Social connections:    Talks on phone: Not on file    Gets together: Not on file    Attends religious service: Not on file    Active member of club or organization: Not on file    Attends meetings of clubs or organizations: Not on file    Relationship status: Not on file  Other Topics Concern  . Not on file  Social History Narrative   Single, no children    Allergies:  Allergies  Allergen  Reactions  . Penicillins Other (See Comments)    Reaction unknown    Metabolic Disorder Labs: Lab Results  Component Value Date   HGBA1C 5.8 (H) 02/23/2014   MPG 120 (H) 02/23/2014   No results found for: PROLACTIN Lab Results  Component Value Date   CHOL 102 11/21/2013   TRIG 66 11/21/2013   HDL 27 (L) 11/21/2013   CHOLHDL 3.8 11/21/2013   VLDL 13 11/21/2013   LDLCALC 62 11/21/2013   Lab Results  Component Value Date   TSH 1.490 11/21/2013    Therapeutic Level Labs: No results found for: LITHIUM No results found for: VALPROATE No components found for:  CBMZ  Current Medications: Current Outpatient Medications  Medication Sig Dispense Refill  . acetaminophen (TYLENOL) 500 MG tablet Take 500 mg by mouth every 4 (four) hours as needed for moderate pain or headache.    . ARIPiprazole (ABILIFY) 15 MG tablet Take 1 tablet (15 mg total) by mouth daily. 90 tablet 0  . aspirin EC 81 MG tablet Take 81 mg by mouth daily.    Marland Kitchen atorvastatin (LIPITOR) 80 MG tablet Take 1 tablet (80 mg total) by mouth daily at 6 PM. 30 tablet 11  . cholecalciferol (VITAMIN D) 1000 units tablet Take 2,000 Units by mouth once a week.    . clonazePAM (KLONOPIN) 0.5 MG tablet Take 1 tablet (0.5 mg total) by mouth at bedtime. 30 tablet 2  . clopidogrel (PLAVIX) 75 MG tablet TAKE (1) TABLET BY MOUTH ONCE DAILY. 90 tablet 2  . cloZAPine (CLOZARIL) 100 MG tablet 100 mg in AM and 500 mg at night 180 tablet 0  . docusate sodium (COLACE) 100 MG capsule Take 100 mg by mouth 2 (two) times daily.     . DULoxetine (CYMBALTA) 60 MG capsule Take 2 capsules (120 mg total) by mouth daily. 180 capsule 0  . insulin aspart (NOVOLOG) 100 UNIT/ML injection Inject 2 Units into the skin 3 (three) times daily before meals. (Patient taking differently: Inject 2 Units into the skin 3 (three) times daily before meals. If sugar is greater than 200 with meal- hold if not.) 10 mL 11  . insulin detemir (LEVEMIR) 100 UNIT/ML injection  Inject 0.3 mLs (30 Units total) into the skin daily. (Patient taking differently: Inject 35 Units into the skin daily. Hold if pt does not eat .) 10 mL 11  . latanoprost (XALATAN) 0.005 % ophthalmic solution Place 1 drop into both eyes at bedtime.    Marland Kitchen  lisinopril (PRINIVIL,ZESTRIL) 20 MG tablet Take 20 mg by mouth daily.    . metoprolol (LOPRESSOR) 50 MG tablet Take 1 tablet (50 mg total) by mouth 2 (two) times daily. 180 tablet 3  . Na Sulfate-K Sulfate-Mg Sulf (SUPREP BOWEL PREP KIT) 17.5-3.13-1.6 GM/177ML SOLN Take 1 kit by mouth as directed. 1 Bottle 0  . nitroGLYCERIN (NITROSTAT) 0.4 MG SL tablet Place 1 tablet (0.4 mg total) under the tongue every 5 (five) minutes x 3 doses as needed for chest pain. 25 tablet 12  . omeprazole (PRILOSEC) 40 MG capsule Take 40 mg by mouth 2 (two) times daily.     . tamsulosin (FLOMAX) 0.4 MG CAPS capsule Take 0.4 mg by mouth daily.      No current facility-administered medications for this visit.      Musculoskeletal: Strength & Muscle Tone: N/A Gait & Station: N/A Patient leans: N/A  Psychiatric Specialty Exam: Review of Systems  Psychiatric/Behavioral: Negative for depression, hallucinations, memory loss, substance abuse and suicidal ideas. The patient is not nervous/anxious and does not have insomnia.   All other systems reviewed and are negative.   There were no vitals taken for this visit.There is no height or weight on file to calculate BMI.  General Appearance: NA  Eye Contact:  NA  Speech:  Clear and Coherent  Volume:  Normal  Mood:  "good"  Affect:  NA  Thought Process:  Coherent  Orientation:  Full (Time, Place, and Person)  Thought Content: Logical   Suicidal Thoughts:  No  Homicidal Thoughts:  No  Memory:  Immediate;   Good  Judgement:  Impaired  Insight:  Shallow  Psychomotor Activity:  Normal  Concentration:  Concentration: Good and Attention Span: Good  Recall:  Good  Fund of Knowledge: Good  Language: Good  Akathisia:   No  Handed:  Right  AIMS (if indicated): not done  Assets:  Communication Skills Desire for Improvement  ADL's:  Intact  Cognition: WNL  Sleep:  Good   Screenings:   Assessment and Plan:  Oscar Elliott is a 58 y.o. year old male with a history of schizophrenia, chronic renal disease, , CADwith history of DES to the RCA, PAD s/p left AKA, hypertension, hyperlipidemia , who presents for follow up appointment for Schizophrenia, unspecified type ([place]) - Plan: CBC with Differential/Platelet  # Schizophrenia He denies paranoia on today's interview, although he was somewhat perseverates on his somatic symptoms.  The staff denies any significant difference from his baseline.  Will continue clozapine at the current dose to target schizophrenia.  Discussed risks, which includes but not limited to constipation, metabolic side effect and granulocytosis.  Will continue Abilify for schizophrenia.  Although it is preferable to be on one antipsychotics, he will benefit from combination of these medication to avoid relapse in schizophrenia.  Will continue duloxetine for depression.  Will continue clonazepam as needed for anxiety.  Discussed risk of oversedation.   Plan I have reviewed and updated plans as below 1.ContinueClozapine 100 mg and 500 at night 2. Continue Abilify 15 mg daily  4. ContinueDuloxetine 120 mg daily 5.ContinueClonazepam 0.5 mg daily 8. Obtain lab (CBC) for monthly monitoring(ordered test to [facility]) 9. Next appointment: 8/10 at 10 AM for 20 mins, phone - he is advised to get lipid/glucose panels  The patient demonstrates the following risk factors for suicide: Chronic risk factors for suicide include:psychiatric disorder ofschizophrenia. Acute risk factorsfor suicide include: unemployment. Protective factorsfor this patient include: positive social support. Considering these factors, the  overall suicide risk at this point appears to below. Patientisappropriate  for outpatient follow up.  Norman Clay, MD 07/15/2018, 10:28 AM

## 2018-07-15 ENCOUNTER — Encounter (HOSPITAL_COMMUNITY): Payer: Self-pay | Admitting: Psychiatry

## 2018-07-15 ENCOUNTER — Other Ambulatory Visit: Payer: Self-pay

## 2018-07-15 ENCOUNTER — Ambulatory Visit (INDEPENDENT_AMBULATORY_CARE_PROVIDER_SITE_OTHER): Payer: Medicare Other | Admitting: Psychiatry

## 2018-07-15 DIAGNOSIS — F209 Schizophrenia, unspecified: Secondary | ICD-10-CM

## 2018-07-15 MED ORDER — DULOXETINE HCL 60 MG PO CPEP: 120.0000 mg | ORAL_CAPSULE | Freq: Every day | ORAL | 0 refills | Status: DC

## 2018-07-15 MED ORDER — CLONAZEPAM 0.5 MG PO TABS: 0.5000 mg | ORAL_TABLET | Freq: Every day | ORAL | 2 refills | Status: DC

## 2018-07-15 MED ORDER — CLOZAPINE 100 MG PO TABS: ORAL_TABLET | ORAL | 0 refills | Status: DC

## 2018-07-15 MED ORDER — ARIPIPRAZOLE 15 MG PO TABS: 15.0000 mg | ORAL_TABLET | Freq: Every day | ORAL | 0 refills | Status: DC

## 2018-07-15 NOTE — Patient Instructions (Addendum)
1.ContinueClozapine 100 mg and 500 at night 2. Continue Abilify 15 mg daily  4. ContinueDuloxetine 120 mg daily 5.ContinueClonazepam 0.5 mg daily 8. Obtain lab (CBC) for monthly monitoring(ordered to [facility]) 9. Next appointment: 8/10 at 10 AM

## 2018-08-20 ENCOUNTER — Telehealth (HOSPITAL_COMMUNITY): Payer: Self-pay | Admitting: Psychiatry

## 2018-08-20 NOTE — Telephone Encounter (Signed)
DR HISADA WILL YOU SEND STANDING LAB ORDER TO [facility] ON [address] [place]

## 2018-08-20 NOTE — Telephone Encounter (Signed)
Monthly blood draw is past due. Please contact the facility to get labs and fax the result to [place] ASAP so that he can continue clozapine. Please also remind the facility that this needs to be done monthly for him to continue medication.

## 2018-08-20 NOTE — Telephone Encounter (Signed)
It is already ordered in the system. Please print out the one if they need it.

## 2018-09-02 ENCOUNTER — Telehealth (HOSPITAL_COMMUNITY): Payer: Self-pay | Admitting: Psychiatry

## 2018-09-02 NOTE — Telephone Encounter (Signed)
We have been requesting the facility to bring blood draw result for the past two weeks. If the staff did not send the result to [place], could you contact [facility] (or where the patient got blood draw) to fax the result to [place]? Thanks.

## 2018-09-03 ENCOUNTER — Telehealth (HOSPITAL_COMMUNITY): Payer: Self-pay | Admitting: Psychiatry

## 2018-09-03 NOTE — Telephone Encounter (Signed)
It seems like the lab was not ordered by me, and we need CBC with diff. Could you ask [facility] whether they did this test as well? If not, please contact the facility and get the test done by today (this really needs to be done by today so that he can stay on his medication).

## 2018-09-03 NOTE — Telephone Encounter (Signed)
It seems like the staff has not sent the lab result yet as of this morning. Could you contact [facility] and ask to fax the test result ASAP? If the blood test is not done for any reason, please contact the facility and get the blood test done today. Please also inform them that I may not be able to continue providing care to him if they are not willing to follow the instruction.

## 2018-09-03 NOTE — Telephone Encounter (Signed)
Hello Dr. Morton Amy. I got the test results faxed over from [facility]. I put them in your box.

## 2018-09-04 ENCOUNTER — Other Ambulatory Visit (HOSPITAL_COMMUNITY): Payer: Self-pay | Admitting: Psychiatry

## 2018-09-04 ENCOUNTER — Telehealth (HOSPITAL_COMMUNITY): Payer: Self-pay

## 2018-09-04 ENCOUNTER — Telehealth (HOSPITAL_COMMUNITY): Payer: Self-pay | Admitting: Psychiatry

## 2018-09-04 DIAGNOSIS — F209 Schizophrenia, unspecified: Secondary | ICD-10-CM

## 2018-09-04 NOTE — Telephone Encounter (Signed)
I received a final lab overdue alert. Please contact the patient and staff and get labs ASAP this morning. Please inform them that he will not be able to continue the current dose of clozapine, and they need to do weekly labs for six months if we fail to follow the required process.

## 2018-09-05 LAB — CBC WITH DIFFERENTIAL/PLATELET
Absolute Monocytes: 411 cells/uL (ref 200–950)
Basophils Absolute: 40 cells/uL (ref 0–200)
Basophils Relative: 0.5 %
Eosinophils Absolute: 71 cells/uL (ref 15–500)
Eosinophils Relative: 0.9 %
HCT: 39.2 % (ref 38.5–50.0)
Hemoglobin: 13.3 g/dL (ref 13.2–17.1)
Lymphs Abs: 2575 cells/uL (ref 850–3900)
MCH: 29.1 pg (ref 27.0–33.0)
MCHC: 33.9 g/dL (ref 32.0–36.0)
MCV: 85.8 fL (ref 80.0–100.0)
MPV: 9.9 fL (ref 7.5–12.5)
Monocytes Relative: 5.2 %
Neutro Abs: 4803 cells/uL (ref 1500–7800)
Neutrophils Relative %: 60.8 %
Platelets: 147 10*3/uL (ref 140–400)
RBC: 4.57 10*6/uL (ref 4.20–5.80)
RDW: 15.2 % — ABNORMAL HIGH (ref 11.0–15.0)
Total Lymphocyte: 32.6 %
WBC: 7.9 10*3/uL (ref 3.8–10.8)

## 2018-09-05 NOTE — Telephone Encounter (Signed)
I called patient to remind him to go and get his bloodwork done to continue getting his needed medication. I spoke with his administrator Jesse Sans (from the [facility]) who told me he had taken patient to [facility] on [address] but they told him they did not receive orders (even though Dr. Modesta Messing had put them in) and so he could not get the results. I called [facility] and after being on hold for 20 minutes they faxed over results to [place]. I gave the results to Dr. Modesta Messing but they were the wrong results. Doctor stated she needed CBC with differential. I then called back [facility] and again after being on hold for 20 minutes, they faxed over CBC results, however they were from 5/12 and when I gave them to the doctor, she stated they needed to be more recent results. I then called the patient back and spoke with [name] again who stated he took patient on 7/2 and got the bloodwork done. I called [facility] (again on hold for 20 minutes) to get results and was told that [facility] was not open on that day. I called Lonnie back and told him I faxed over orders to [facility] and they were successfully transmitted so he could take patient back. He did and was told they did not get the orders. At this point, the decision was made by the doctor that we were not going to go through [facility] anymore and she produced the CBC with differential order for the patient to get the bloodwork done at [facility] here in [place] down the hall from the office. Patient's administrator, Jesse Sans, is picking up the order thismorning (Friday 7/10) and taking the patient to [facility].

## 2018-09-07 ENCOUNTER — Other Ambulatory Visit (HOSPITAL_COMMUNITY): Payer: Self-pay | Admitting: Psychiatry

## 2018-09-07 MED ORDER — CLOZAPINE 100 MG PO TABS: ORAL_TABLET | ORAL | 0 refills | Status: DC

## 2018-09-08 ENCOUNTER — Other Ambulatory Visit (HOSPITAL_COMMUNITY): Payer: Self-pay | Admitting: Psychiatry

## 2018-09-08 ENCOUNTER — Telehealth (HOSPITAL_COMMUNITY): Payer: Self-pay | Admitting: Psychiatry

## 2018-09-08 DIAGNOSIS — F209 Schizophrenia, unspecified: Secondary | ICD-10-CM

## 2018-09-08 NOTE — Telephone Encounter (Signed)
Reviewed labs- it is within acceptable range. Please advise them to continue current dose of clozapine. Also remind them to get blood test every month (next expected date on 8/10). I would advise them to have test done at [facility] in our building given difficulty we had at other place. I ordered monthly labs in our system.

## 2018-09-08 NOTE — Telephone Encounter (Signed)
Medication management - Telephone call with [name] to inform of Dr. Ivor Reining instruction regarding continued Clozaril dosage and [name] agreed in the coming month on 10/06/18 he would bring patient to [facility] for next needed labs. Collateral will call back if any problems prior to then and in the coming month to very order is at [place] for 10/06/18.

## 2018-09-15 ENCOUNTER — Telehealth: Payer: Self-pay | Admitting: Gastroenterology

## 2018-09-15 NOTE — Telephone Encounter (Signed)
The caregiver of patient called to reschedule procedure on 7/28 with SF. Please call [phone]

## 2018-09-15 NOTE — Telephone Encounter (Signed)
Endo scheduler aware. 

## 2018-09-15 NOTE — Telephone Encounter (Signed)
Spoke to caregiver Robet Leu), want to reschedule TCS w/Propofol w/[facility] (they do not want to have him out right now d/t COVID). Will call to reschedule when [facility]'s October schedule is available.

## 2018-09-16 ENCOUNTER — Other Ambulatory Visit: Payer: Self-pay | Admitting: *Deleted

## 2018-09-16 ENCOUNTER — Encounter (HOSPITAL_COMMUNITY): Admission: RE | Admit: 2018-09-16 | Payer: Medicare Other | Source: Ambulatory Visit

## 2018-09-16 DIAGNOSIS — Z1211 Encounter for screening for malignant neoplasm of colon: Secondary | ICD-10-CM

## 2018-09-16 MED ORDER — NA SULFATE-K SULFATE-MG SULF 17.5-3.13-1.6 GM/177ML PO SOLN: 1.0000 | Freq: Once | ORAL | 0 refills | Status: AC

## 2018-09-16 NOTE — Telephone Encounter (Signed)
LMOVM for Lonnie to r/s

## 2018-09-16 NOTE — Telephone Encounter (Signed)
Caregiver called back. He requested patient have an afternoon appt if at all possible and is aware patient is a diabetic. Patient scheduled for 10/13 at 2:15pm. Instructions with pre-op appt will be mailed to patient (confirmed address). Orders entered. New Rx sent to pharmacy.

## 2018-09-23 ENCOUNTER — Encounter (HOSPITAL_COMMUNITY): Admission: RE | Payer: Self-pay | Source: Home / Self Care

## 2018-09-23 ENCOUNTER — Ambulatory Visit (HOSPITAL_COMMUNITY): Admission: RE | Admit: 2018-09-23 | Payer: Medicare Other | Source: Home / Self Care | Admitting: Gastroenterology

## 2018-09-23 SURGERY — COLONOSCOPY WITH PROPOFOL
Anesthesia: Monitor Anesthesia Care

## 2018-09-30 NOTE — Progress Notes (Signed)
Virtual Visit via Telephone Note  I connected with Oscar Elliott on 10/06/18 at 10:00 AM EDT by telephone and verified that I am speaking with the correct person using two identifiers.   I discussed the limitations, risks, security and privacy concerns of performing an evaluation and management service by telephone and the availability of in person appointments. I also discussed with the patient that there may be a patient responsible charge related to this service. The patient expressed understanding and agreed to proceed.    I discussed the assessment and treatment plan with the patient. The patient was provided an opportunity to ask questions and all were answered. The patient agreed with the plan and demonstrated an understanding of the instructions.   The patient was advised to call back or seek an in-person evaluation if the symptoms worsen or if the condition fails to improve as anticipated.  I provided 15 minutes of non-face-to-face time during this encounter.   Norman Clay, MD     [facility] MD/PA/NP OP Progress Note  10/06/2018 10:23 AM Oscar Elliott  MRN:  [id number]  Chief Complaint:  Chief Complaint    Follow-up; Schizophrenia     HPI:  This is a follow-up visit for schizophrenia.  Sem states that he has been doing well.  He tends to lay down most of the time.  He misses to come to the office. He enjoys doing Quar[employer]. Although he feels "kind of" depressed, he does not know how to elaborate it. He sleeps well. He has good energy.  He has good appetite.  He denies SI, HI.  He denies paranoia.  He denies AH, VH.  He agrees to try sit in the porch to get enough sunlight during the day.   The staff at [facility] family care presents to the interview.  Oscar Elliott has been doing.  The staff make sure for him to eat Elliott.  Oscar Elliott.  There has been no behavior issues or safety concern.  No known hallucinations or paranoia. The staff is reminded to bring  him for monthly blood test today.   Visit Diagnosis:    ICD-10-CM   1. Schizophrenia, unspecified type ([place])  F20.9     Past Psychiatric History: Please see initial evaluation for full details. I have reviewed the history. No updates at this time.     Past Medical History:  Past Medical History:  Diagnosis Date  . CAD (coronary artery disease)    a. 10/2013 Lateral STEMI/PCI: LM nl, LAD min irregs, D1 occluded sub branch, LCX min irregs mid, OM large/patent, RCA 1100m(2.75x28 Promus DES).  . Cholelithiasis   . Essential hypertension   . History of renal failure    a. 06/2007: in setting of rhabdo post L AKA.  .Marland KitchenHyperlipidemia   . [place] infarction) ([place]   . Nephrolithiasis   . [place] disease [place]    a. 06/2007: s/p L AKA @ [facility]  . Schizophrenia ([place]   . Type 2 diabetes mellitus ([place]     Past Surgical History:  Procedure Laterality Date  . Above-the-knee amputation Left   . LEFT HEART CATHETERIZATION WITH CORONARY ANGIOGRAM N/A 11/20/2013   Procedure: LEFT HEART CATHETERIZATION WITH CORONARY ANGIOGRAM;  Surgeon: JJettie Booze MD;  [facility];  Service: Cardiovascular;  Laterality: N/A;    Family Psychiatric History: Please see initial evaluation for full details. I have reviewed the history. No updates at this time.     Family History:  Family History  Problem Relation Age of Onset  . Other Other        no premature CAD.    Social History:  Social History   Socioeconomic History  . Marital status: Single    Spouse name: Not on file  . Number of children: Not on file  . Years of education: Not on file  . Highest education level: Not on file  Occupational History  . Not on file  Social Needs  . Financial resource strain: Not on file  . Food insecurity    Worry: Not on file    Inability: Not on file  . Transportation needs    Medical: Not on file    Non-medical: Not on file  Tobacco Use  . Smoking status: Current  Every Day Smoker    Packs/day: 0.25    Years: 20.00    Pack years: 5.00    Types: Cigars  . Smokeless tobacco: Never Used  Substance and Sexual Activity  . Alcohol use: No    Alcohol/week: 0.0 standard drinks  . Drug use: No  . Sexual activity: Never  Lifestyle  . Physical activity    Days per week: Not on file    Minutes per session: Not on file  . Stress: Not on file  Relationships  . Social Herbalist on phone: Not on file    Gets together: Not on file    Attends religious service: Not on file    Active member of club or organization: Not on file    Attends meetings of clubs or organizations: Not on file    Relationship status: Not on file  Other Topics Concern  . Not on file  Social History Narrative   Single, no children    Allergies:  Allergies  Allergen Reactions  . Penicillins Other (See Comments)    Reaction unknown    Metabolic Disorder Labs: Lab Results  Component Value Date   HGBA1C 5.8 (H) 02/23/2014   MPG 120 (H) 02/23/2014   No results found for: PROLACTIN Lab Results  Component Value Date   CHOL 102 11/21/2013   TRIG 66 11/21/2013   HDL 27 (L) 11/21/2013   CHOLHDL 3.8 11/21/2013   VLDL 13 11/21/2013   LDLCALC 62 11/21/2013   Lab Results  Component Value Date   TSH 1.490 11/21/2013    Therapeutic Level Labs: No results found for: LITHIUM No results found for: VALPROATE No components found for:  CBMZ  Current Medications: Current Outpatient Medications  Medication Sig Dispense Refill  . acetaminophen (TYLENOL) 500 MG tablet Take 500 mg by mouth every 4 (four) hours as needed for moderate pain or headache.    Derrill Memo ON 10/15/2018] ARIPiprazole (ABILIFY) 15 MG tablet Take 1 tablet (15 mg total) by mouth daily. 90 tablet 0  . aspirin EC 81 MG tablet Take 81 mg by mouth daily.    Marland Kitchen atorvastatin (LIPITOR) 80 MG tablet Take 1 tablet (80 mg total) by mouth daily at 6 PM. 30 tablet 11  . Cholecalciferol (VITAMIN D) 50 MCG (2000  UT) CAPS Take 2,000 Units by mouth every Wednesday.     Derrill Memo ON 10/16/2018] clonazePAM (KLONOPIN) 0.5 MG tablet Take 1 tablet (0.5 mg total) by mouth at bedtime. 30 tablet 2  . clopidogrel (PLAVIX) 75 MG tablet TAKE (1) TABLET BY MOUTH ONCE DAILY. 90 tablet 2  . clotrimazole (LOTRIMIN) 1 % cream Apply 1 application topically 2 (two) times daily.    Marland Kitchen  cloZAPine (CLOZARIL) 100 MG tablet 100 mg in AM and 500 mg at night (Patient taking differently: Take 100-500 mg by mouth See admin instructions. Take 100 mg by mouth in the morning and take 500 mg by mouth at bedtime) 180 tablet 0  . docusate sodium (COLACE) 100 MG capsule Take 100 mg by mouth 2 (two) times daily.     Derrill Memo ON 10/15/2018] DULoxetine (CYMBALTA) 60 MG capsule Take 2 capsules (120 mg total) by mouth daily. 180 capsule 0  . gabapentin (NEURONTIN) 100 MG capsule Take 100 mg by mouth 2 (two) times daily.    . hydrOXYzine (ATARAX/VISTARIL) 25 MG tablet Take 25 mg by mouth 2 (two) times daily as needed for anxiety or itching.    . insulin aspart (NOVOLOG) 100 UNIT/ML injection Inject 2 Units into the skin 3 (three) times daily before meals. (Patient taking differently: Inject 2 Units into the skin 3 (three) times daily as needed (if BS > 200 and resident eats a meal. Hold if resident does not eat.). ) 10 mL 11  . insulin detemir (LEVEMIR) 100 UNIT/ML injection Inject 0.3 mLs (30 Units total) into the skin daily. (Patient taking differently: Inject 35 Units into the skin every evening. Hold if pt does not eat .) 10 mL 11  . ketoconazole (NIZORAL) 2 % shampoo Apply 1 application topically 2 (two) times a week.    . latanoprost (XALATAN) 0.005 % ophthalmic solution Place 1 drop into both eyes at bedtime.    Marland Kitchen lisinopril (ZESTRIL) 10 MG tablet Take 10 mg by mouth daily.     . metoprolol (LOPRESSOR) 50 MG tablet Take 1 tablet (50 mg total) by mouth 2 (two) times daily. 180 tablet 3  . Na Sulfate-K Sulfate-Mg Sulf (SUPREP BOWEL PREP KIT)  17.5-3.13-1.6 GM/177ML SOLN Take 1 kit by mouth as directed. 1 Bottle 0  . nitroGLYCERIN (NITROSTAT) 0.4 MG SL tablet Place 1 tablet (0.4 mg total) under the tongue every 5 (five) minutes x 3 doses as needed for chest pain. 25 tablet 12  . omeprazole (PRILOSEC) 40 MG capsule Take 40 mg by mouth 2 (two) times daily.     . polyethylene glycol (MIRALAX / GLYCOLAX) 17 g packet Take 17 g by mouth daily as needed for moderate constipation.    . tamsulosin (FLOMAX) 0.4 MG CAPS capsule Take 0.4 mg by mouth daily.      No current facility-administered medications for this visit.      Musculoskeletal: Strength & Muscle Tone: N/A Gait & Station: N/A Patient leans: N/A  Psychiatric Specialty Exam: Review of Systems  Psychiatric/Behavioral: Negative for depression, hallucinations, memory loss, substance abuse and suicidal ideas. The patient is not nervous/anxious and does not have insomnia.   All other systems reviewed and are negative.   There were no vitals taken for this visit.There is no height or weight on file to calculate BMI.  General Appearance: NA  Eye Contact:  NA  Speech:  Clear and Coherent  Volume:  Normal  Mood:  "good"  Affect:  NA  Thought Process:  Coherent  Orientation:  Full (Time, Place, and Person)  Thought Content: Logical   Suicidal Thoughts:  No  Homicidal Thoughts:  No  Memory:  Immediate;   Good  Judgement:  Fair  Insight:  Present  Psychomotor Activity:  Normal  Concentration:  Concentration: Good and Attention Span: Good  Recall:  Good  Fund of Knowledge: Good  Language: Good  Akathisia:  No  Handed:  Right  AIMS (if indicated): not done  Assets:  Communication Skills Desire for Improvement  ADL's:  Intact  Cognition: WNL  Sleep:  Good   Screenings:   Assessment and Plan:  Oscar Elliott is a 58 y.o. year old male with a history of schizophrenia,chronic renal disease, , CADwith history of DES to the RCA, PAD s/p left AKA, hypertension,  hyperlipidemia , who presents for follow up appointment for schizophrenia,   # Schizophrenia Exam is notable for concrete but linear thought process, and the staff denies any significant change since last visit.  Will continue clozapine at the current dose to target schizophrenia.  Discussed risks, which includes but not limited to constipation, metabolic side effect and granulocytosis.  The staff is reminded again to obtain monthly blood test for the patient to safely continue his medication.  Will continue Abilify for schizophrenia.  Although it is preferable to be on one antipsychotics, he will benefit from these combination to avoid relapse in schizophrenia.  Will continue duloxetine for depression.  Will continue clonazepam as needed for anxiety.  Discussed risk of oversedation.   Plan I have reviewed and updated plans as below 1.ContinueClozapine 100 mg and 500 at night 2. Continue Abilify 15 mg daily 7 3. ContinueDuloxetine 120 mg daily 4.ContinueClonazepam 0.5 mg daily 5. Obtain lab (CBC) for monthly monitoring([facility], due on 10/05/2008) 6. Next appointment: 11/2 at 11 AM for 20 mins, phone - he is advised to get lipid/glucose panels Facility phone: [phone]  The patient demonstrates the following risk factors for suicide: Chronic risk factors for suicide include:psychiatric disorder ofschizophrenia. Acute risk factorsfor suicide include: unemployment. Protective factorsfor this patient include: positive social support. Considering these factors, the overall suicide risk at this point appears to below. Patientisappropriate for outpatient follow up.  Norman Clay, MD 10/06/2018, 10:23 AM

## 2018-10-06 ENCOUNTER — Encounter (HOSPITAL_COMMUNITY): Payer: Self-pay | Admitting: Psychiatry

## 2018-10-06 ENCOUNTER — Ambulatory Visit (INDEPENDENT_AMBULATORY_CARE_PROVIDER_SITE_OTHER): Payer: Medicare Other | Admitting: Psychiatry

## 2018-10-06 ENCOUNTER — Other Ambulatory Visit: Payer: Self-pay

## 2018-10-06 DIAGNOSIS — F209 Schizophrenia, unspecified: Secondary | ICD-10-CM

## 2018-10-06 MED ORDER — DULOXETINE HCL 60 MG PO CPEP: 120.0000 mg | ORAL_CAPSULE | Freq: Every day | ORAL | 0 refills | Status: DC

## 2018-10-06 MED ORDER — ARIPIPRAZOLE 15 MG PO TABS: 15.0000 mg | ORAL_TABLET | Freq: Every day | ORAL | 0 refills | Status: DC

## 2018-10-06 MED ORDER — CLONAZEPAM 0.5 MG PO TABS: 0.5000 mg | ORAL_TABLET | Freq: Every day | ORAL | 2 refills | Status: DC

## 2018-10-06 NOTE — Patient Instructions (Addendum)
1.ContinueClozapine 100 mg and 500 at night 2. Continue Abilify 15 mg daily  3. ContinueDuloxetine 120 mg daily 4.ContinueClonazepam 0.5 mg daily 5. Please obtain labs at [place] today for monthly monitoring  6. Next appointment: 11/2

## 2018-10-07 ENCOUNTER — Telehealth (HOSPITAL_COMMUNITY): Payer: Self-pay | Admitting: Psychiatry

## 2018-10-07 NOTE — Telephone Encounter (Signed)
I advised the staff to get blood test for clozapine, which was due yesterday. Could you check with them if they have done the test? If not, please advise them to get it today at [place] in our building.

## 2018-10-07 NOTE — Telephone Encounter (Signed)
Please make sure that they do this every month by due date. Please inform them that I do not feel comfortable continuing to prescribe his medication if they are not able to follow the protocol recommended for clozapine regulation.

## 2018-10-07 NOTE — Telephone Encounter (Signed)
They came yesterday & when they arrived there was a sign on the lab stating that they were closing @ 4 pm. They stopped by to tell Donna(Front Office)  & that they would come back later in the week.

## 2018-10-13 ENCOUNTER — Telehealth (HOSPITAL_COMMUNITY): Payer: Self-pay | Admitting: Psychiatry

## 2018-10-13 NOTE — Telephone Encounter (Signed)
CBC DONE ON 7/11 IN [facility] IS THERE SOMETHING ELSE?

## 2018-10-13 NOTE — Telephone Encounter (Signed)
Needs to do MONTHLY labs for him to continue clozapine, and the due was 8/10.

## 2018-10-13 NOTE — Telephone Encounter (Signed)
We have not received any blood test result. Could you contact the facility and ensure they had blood work as we discussed last week?

## 2018-10-14 ENCOUNTER — Other Ambulatory Visit (HOSPITAL_COMMUNITY): Payer: Self-pay | Admitting: Psychiatry

## 2018-10-14 ENCOUNTER — Telehealth (HOSPITAL_COMMUNITY): Payer: Self-pay | Admitting: *Deleted

## 2018-10-14 LAB — CBC WITH DIFFERENTIAL/PLATELET
Absolute Monocytes: 357 cells/uL (ref 200–950)
Basophils Absolute: 42 cells/uL (ref 0–200)
Basophils Relative: 0.6 %
Eosinophils Absolute: 70 cells/uL (ref 15–500)
Eosinophils Relative: 1 %
HCT: 37.9 % — ABNORMAL LOW (ref 38.5–50.0)
Hemoglobin: 12.7 g/dL — ABNORMAL LOW (ref 13.2–17.1)
Lymphs Abs: 2261 cells/uL (ref 850–3900)
MCH: 28.7 pg (ref 27.0–33.0)
MCHC: 33.5 g/dL (ref 32.0–36.0)
MCV: 85.6 fL (ref 80.0–100.0)
MPV: 9.8 fL (ref 7.5–12.5)
Monocytes Relative: 5.1 %
Neutro Abs: 4270 cells/uL (ref 1500–7800)
Neutrophils Relative %: 61 %
Platelets: 143 10*3/uL (ref 140–400)
RBC: 4.43 10*6/uL (ref 4.20–5.80)
RDW: 15.7 % — ABNORMAL HIGH (ref 11.0–15.0)
Total Lymphocyte: 32.3 %
WBC: 7 10*3/uL (ref 3.8–10.8)

## 2018-10-14 MED ORDER — CLOZAPINE 100 MG PO TABS: ORAL_TABLET | ORAL | 0 refills | Status: DC

## 2018-10-14 NOTE — Telephone Encounter (Signed)
SPOKE WITH THE [facility] HOME DIRECTOR & INFORMED PER DR HISADA:Ordered. Please contact the facility and make sure that they got labs. Please discuss with them that I will not be able to continue his care/need to transfer care to other provider if they continue to be non adherent to treatment advice/recommendation.  AND THAT LABS ARE NEEDED BY THE  10 TH OF EACH MONTH   -- LABS WILL BE DONE TODAY

## 2018-10-14 NOTE — Telephone Encounter (Signed)
RX CARE REQUESTED REFILL :  cloZAPine (CLOZARIL) 100 MG tablet 180 tablet 0 09/07/2018    Sig: 100 mg in AM and 500 mg at night

## 2018-10-14 NOTE — Telephone Encounter (Signed)
Ordered. Please contact the facility and make sure that they got labs. Please discuss with them that I will not be able to continue his care/need to transfer care to other provider if they continue to be non adherent to treatment advice/recommendation.

## 2018-10-15 ENCOUNTER — Encounter (HOSPITAL_COMMUNITY): Payer: Self-pay | Admitting: Psychiatry

## 2018-10-15 ENCOUNTER — Other Ambulatory Visit: Payer: Self-pay | Admitting: Cardiology

## 2018-11-04 ENCOUNTER — Other Ambulatory Visit (HOSPITAL_COMMUNITY): Payer: Self-pay | Admitting: Psychiatry

## 2018-11-04 MED ORDER — CLOZAPINE 100 MG PO TABS: ORAL_TABLET | ORAL | 0 refills | Status: DC

## 2018-11-05 ENCOUNTER — Other Ambulatory Visit (HOSPITAL_COMMUNITY): Payer: Self-pay | Admitting: Psychiatry

## 2018-11-05 LAB — CBC WITH DIFFERENTIAL/PLATELET
Absolute Monocytes: 400 cells/uL (ref 200–950)
Basophils Absolute: 37 cells/uL (ref 0–200)
Basophils Relative: 0.5 %
Eosinophils Absolute: 111 cells/uL (ref 15–500)
Eosinophils Relative: 1.5 %
HCT: 38.7 % (ref 38.5–50.0)
Hemoglobin: 12.8 g/dL — ABNORMAL LOW (ref 13.2–17.1)
Lymphs Abs: 2168 cells/uL (ref 850–3900)
MCH: 28.6 pg (ref 27.0–33.0)
MCHC: 33.1 g/dL (ref 32.0–36.0)
MCV: 86.4 fL (ref 80.0–100.0)
MPV: 10.4 fL (ref 7.5–12.5)
Monocytes Relative: 5.4 %
Neutro Abs: 4684 cells/uL (ref 1500–7800)
Neutrophils Relative %: 63.3 %
Platelets: 136 10*3/uL — ABNORMAL LOW (ref 140–400)
RBC: 4.48 10*6/uL (ref 4.20–5.80)
RDW: 15.9 % — ABNORMAL HIGH (ref 11.0–15.0)
Total Lymphocyte: 29.3 %
WBC: 7.4 10*3/uL (ref 3.8–10.8)

## 2018-12-03 ENCOUNTER — Other Ambulatory Visit (HOSPITAL_COMMUNITY): Payer: Self-pay | Admitting: Psychiatry

## 2018-12-03 MED ORDER — CLOZAPINE 100 MG PO TABS: ORAL_TABLET | ORAL | 0 refills | Status: DC

## 2018-12-04 ENCOUNTER — Telehealth: Payer: Self-pay | Admitting: *Deleted

## 2018-12-04 NOTE — Patient Instructions (Signed)
Oscar Elliott  12/04/2018     @PREFPERIOPPHARMACY @   Your procedure is scheduled on  12/08/2018   Report to 02/07/2019 at  1245  P.M.  Call this number if you have problems the morning of surgery:  [phone]   Remember:  Follow the diet and prep instructions given to you by Dr [phone] office.                       Take these medicines the morning of surgery with A SIP OF WATER  Abilify, clozapine, cymbalta, pepcid or prilosec, gabapentin, hydroxyzine(if needed), metoprolol, flomax. Take only 1/2 of your usual night time insulin. DO NOT take any medications foe diabetes the morning of your procedure.    Do not wear jewelry, make-up or nail polish.  Do not wear lotions, powders, or perfumes. Please wear deodorant and brush your teeth.  Do not shave 48 hours prior to surgery.  Men may shave face and neck.  Do not bring valuables to the hospital.  [facility] is not responsible for any belongings or valuables.  Contacts, dentures or bridgework may not be worn into surgery.  Leave your suitcase in the car.  After surgery it may be brought to your room.  For patients admitted to the hospital, discharge time will be determined by your treatment team.  Patients discharged the day of surgery will not be allowed to drive home.   Name and phone number of your driver:   Staff Special instructions:  None  Please read over the following fact sheets that you were given. Anesthesia Post-op Instructions and Care and Recovery After Surgery       Colonoscopy, Adult, Care After This sheet gives you information about how to care for yourself after your procedure. Your health care provider may also give you more specific instructions. If you have problems or questions, contact your health care provider. What can I expect after the procedure? After the procedure, it is common to have:  A small amount of blood in your stool for 24 hours after the procedure.  Some gas.  Mild  abdominal cramping or bloating. Follow these instructions at home: General instructions  For the first 24 hours after the procedure: ? Do not drive or use machinery. ? Do not sign important documents. ? Do not drink alcohol. ? Do your regular daily activities at a slower pace than normal. ? Eat soft, easy-to-digest foods.  Take over-the-counter or prescription medicines only as told by your health care provider. Relieving cramping and bloating   Try walking around when you have cramps or feel bloated.  Apply heat to your abdomen as told by your health care provider. Use a heat source that your health care provider recommends, such as a moist heat pack or a heating pad. ? Place a towel between your skin and the heat source. ? Leave the heat on for 20-30 minutes. ? Remove the heat if your skin turns bright red. This is especially important if you are unable to feel pain, heat, or cold. You may have a greater risk of getting burned. Eating and drinking   Drink enough fluid to keep your urine pale yellow.  Resume your normal diet as instructed by your health care provider. Avoid heavy or fried foods that are hard to digest.  Avoid drinking alcohol for as long as instructed by your health care provider. Contact a health care provider if:  You have  blood in your stool 2-3 days after the procedure. Get help right away if:  You have more than a small spotting of blood in your stool.  You pass large blood clots in your stool.  Your abdomen is swollen.  You have nausea or vomiting.  You have a fever.  You have increasing abdominal pain that is not relieved with medicine. Summary  After the procedure, it is common to have a small amount of blood in your stool. You may also have mild abdominal cramping and bloating.  For the first 24 hours after the procedure, do not drive or use machinery, sign important documents, or drink alcohol.  Contact your health care provider if you  have a lot of blood in your stool, nausea or vomiting, a fever, or increased abdominal pain. This information is not intended to replace advice given to you by your health care provider. Make sure you discuss any questions you have with your health care provider. Document Released: 09/27/2003 Document Revised: 12/05/2016 Document Reviewed: 04/26/2015 Elsevier Patient Education  2020 [place] After These instructions provide you with information about caring for yourself after your procedure. Your health care provider may also give you more specific instructions. Your treatment has been planned according to current medical practices, but problems sometimes occur. Call your health care provider if you have any problems or questions after your procedure. What can I expect after the procedure? After your procedure, you may:  Feel sleepy for several hours.  Feel clumsy and have poor balance for several hours.  Feel forgetful about what happened after the procedure.  Have poor judgment for several hours.  Feel nauseous or vomit.  Have a sore throat if you had a breathing tube during the procedure. Follow these instructions at home: For at least 24 hours after the procedure:      Have a responsible adult stay with you. It is important to have someone help care for you until you are awake and alert.  Rest as needed.  Do not: ? Participate in activities in which you could fall or become injured. ? Drive. ? Use heavy machinery. ? Drink alcohol. ? Take sleeping pills or medicines that cause drowsiness. ? Make important decisions or sign legal documents. ? Take care of children on your own. Eating and drinking  Follow the diet that is recommended by your health care provider.  If you vomit, drink water, juice, or soup when you can drink without vomiting.  Make sure you have little or no nausea before eating solid foods. General instructions   Take over-the-counter and prescription medicines only as told by your health care provider.  If you have sleep apnea, surgery and certain medicines can increase your risk for breathing problems. Follow instructions from your health care provider about wearing your sleep device: ? Anytime you are sleeping, including during daytime naps. ? While taking prescription pain medicines, sleeping medicines, or medicines that make you drowsy.  If you smoke, do not smoke without supervision.  Keep all follow-up visits as told by your health care provider. This is important. Contact a health care provider if:  You keep feeling nauseous or you keep vomiting.  You feel light-headed.  You develop a rash.  You have a fever. Get help right away if:  You have trouble breathing. Summary  For several hours after your procedure, you may feel sleepy and have poor judgment.  Have a responsible adult stay with you for at least  24 hours or until you are awake and alert. This information is not intended to replace advice given to you by your health care provider. Make sure you discuss any questions you have with your health care provider. Document Released: 06/05/2015 Document Revised: 05/13/2017 Document Reviewed: 06/05/2015 Elsevier Patient Education  2020 [employer]

## 2018-12-04 NOTE — Telephone Encounter (Signed)
Received a call from Bell in endo. Patient at facility. Will need COVID-19 testing morning of procedure.  Called # listed for patient and it was the caregiver. He is aware patient needs COVID-19 testing morning of and someone will have to sit with him while awaiting test results. He is also aware patient prep instructions will change to all day before. See previous note that patient is diabetic but they requested afternoon procedure time. Caregiver going to call back with fax # to fax new instructions.

## 2018-12-04 NOTE — Telephone Encounter (Signed)
Received a call from caregiver. Reports fax is not working at the facility. Asked if this could be emailed to them at [email or web address]

## 2018-12-04 NOTE — Telephone Encounter (Signed)
Received call back from caregiver with confirmation instructions were received

## 2018-12-05 ENCOUNTER — Encounter (HOSPITAL_COMMUNITY): Payer: Self-pay

## 2018-12-05 ENCOUNTER — Encounter (HOSPITAL_COMMUNITY)
Admission: RE | Admit: 2018-12-05 | Discharge: 2018-12-05 | Disposition: A | Discharge status: Home / Self Care | Payer: Medicare Other | Source: Ambulatory Visit | Attending: Gastroenterology | Admitting: Gastroenterology

## 2018-12-05 ENCOUNTER — Other Ambulatory Visit (HOSPITAL_COMMUNITY): Admission: RE | Admit: 2018-12-05 | Payer: Medicare Other | Source: Ambulatory Visit

## 2018-12-05 ENCOUNTER — Other Ambulatory Visit: Payer: Self-pay

## 2018-12-05 DIAGNOSIS — Z01812 Encounter for preprocedural laboratory examination: Secondary | ICD-10-CM | POA: Insufficient documentation

## 2018-12-05 LAB — BASIC METABOLIC PANEL
Anion gap: 7 (ref 5–15)
BUN: 23 mg/dL — ABNORMAL HIGH (ref 6–20)
CO2: 23 mmol/L (ref 22–32)
Calcium: 8.5 mg/dL — ABNORMAL LOW (ref 8.9–10.3)
Chloride: 107 mmol/L (ref 98–111)
Creatinine, Ser: 1.34 mg/dL — ABNORMAL HIGH (ref 0.61–1.24)
GFR calc Af Amer: >60 mL/min (ref >60)
GFR calc non Af Amer: 58 mL/min — ABNORMAL LOW (ref >60)
Glucose, Bld: 142 mg/dL — ABNORMAL HIGH (ref 70–99)
Potassium: 4 mmol/L (ref 3.5–5.1)
Sodium: 137 mmol/L (ref 135–145)

## 2018-12-08 ENCOUNTER — Other Ambulatory Visit (HOSPITAL_COMMUNITY): Payer: Self-pay

## 2018-12-08 NOTE — Progress Notes (Signed)
cc'd to pcp 

## 2018-12-09 ENCOUNTER — Ambulatory Visit (HOSPITAL_COMMUNITY): Payer: Medicare Other | Admitting: Anesthesiology

## 2018-12-09 ENCOUNTER — Ambulatory Visit (HOSPITAL_COMMUNITY)
Admission: RE | Admit: 2018-12-09 | Discharge: 2018-12-09 | Disposition: A | Discharge status: Home / Self Care | Payer: Medicare Other | Attending: Gastroenterology | Admitting: Gastroenterology

## 2018-12-09 ENCOUNTER — Encounter (HOSPITAL_COMMUNITY): Payer: Self-pay | Admitting: *Deleted

## 2018-12-09 ENCOUNTER — Encounter (HOSPITAL_COMMUNITY)
Admission: RE | Disposition: A | Discharge status: Home / Self Care | Payer: Self-pay | Source: Home / Self Care | Attending: Gastroenterology

## 2018-12-09 ENCOUNTER — Other Ambulatory Visit: Payer: Self-pay

## 2018-12-09 ENCOUNTER — Other Ambulatory Visit (HOSPITAL_COMMUNITY)
Admission: RE | Admit: 2018-12-09 | Discharge: 2018-12-09 | Disposition: A | Discharge status: Home / Self Care | Payer: Medicare Other | Source: Ambulatory Visit | Attending: Gastroenterology | Admitting: Gastroenterology

## 2018-12-09 DIAGNOSIS — Z7902 Long term (current) use of antithrombotics/antiplatelets: Secondary | ICD-10-CM | POA: Insufficient documentation

## 2018-12-09 DIAGNOSIS — E1151 Type 2 diabetes mellitus with diabetic peripheral angiopathy without gangrene: Secondary | ICD-10-CM | POA: Insufficient documentation

## 2018-12-09 DIAGNOSIS — E785 Hyperlipidemia, unspecified: Secondary | ICD-10-CM | POA: Insufficient documentation

## 2018-12-09 DIAGNOSIS — Z7982 Long term (current) use of aspirin: Secondary | ICD-10-CM | POA: Insufficient documentation

## 2018-12-09 DIAGNOSIS — I252 Old myocardial infarction: Secondary | ICD-10-CM | POA: Insufficient documentation

## 2018-12-09 DIAGNOSIS — Z538 Procedure and treatment not carried out for other reasons: Secondary | ICD-10-CM | POA: Diagnosis not present

## 2018-12-09 DIAGNOSIS — I10 Essential (primary) hypertension: Secondary | ICD-10-CM | POA: Diagnosis not present

## 2018-12-09 DIAGNOSIS — Z89612 Acquired absence of left leg above knee: Secondary | ICD-10-CM | POA: Insufficient documentation

## 2018-12-09 DIAGNOSIS — Z1211 Encounter for screening for malignant neoplasm of colon: Secondary | ICD-10-CM | POA: Diagnosis present

## 2018-12-09 DIAGNOSIS — Z794 Long term (current) use of insulin: Secondary | ICD-10-CM | POA: Insufficient documentation

## 2018-12-09 DIAGNOSIS — Z79899 Other long term (current) drug therapy: Secondary | ICD-10-CM | POA: Insufficient documentation

## 2018-12-09 DIAGNOSIS — Z955 Presence of coronary angioplasty implant and graft: Secondary | ICD-10-CM | POA: Insufficient documentation

## 2018-12-09 DIAGNOSIS — I2510 Atherosclerotic heart disease of native coronary artery without angina pectoris: Secondary | ICD-10-CM | POA: Insufficient documentation

## 2018-12-09 DIAGNOSIS — K644 Residual hemorrhoidal skin tags: Secondary | ICD-10-CM | POA: Insufficient documentation

## 2018-12-09 DIAGNOSIS — F209 Schizophrenia, unspecified: Secondary | ICD-10-CM | POA: Diagnosis not present

## 2018-12-09 DIAGNOSIS — Z20828 Contact with and (suspected) exposure to other viral communicable diseases: Secondary | ICD-10-CM | POA: Insufficient documentation

## 2018-12-09 DIAGNOSIS — F17210 Nicotine dependence, cigarettes, uncomplicated: Secondary | ICD-10-CM | POA: Diagnosis not present

## 2018-12-09 HISTORY — PX: COLONOSCOPY WITH PROPOFOL: SHX5780

## 2018-12-09 LAB — SARS CORONAVIRUS 2 BY RT PCR (HOSPITAL ORDER, PERFORMED IN ~~LOC~~ HOSPITAL LAB): SARS Coronavirus 2: NEGATIVE

## 2018-12-09 LAB — GLUCOSE, CAPILLARY: Glucose-Capillary: 91 mg/dL (ref 70–99)

## 2018-12-09 SURGERY — COLONOSCOPY WITH PROPOFOL
Anesthesia: General

## 2018-12-09 MED ORDER — HYDROMORPHONE HCL 1 MG/ML IJ SOLN: 0.2500 mg | INTRAMUSCULAR | Status: DC | PRN

## 2018-12-09 MED ORDER — PROPOFOL 10 MG/ML IV BOLUS
INTRAVENOUS | Status: AC
  Filled 2018-12-09: qty 20

## 2018-12-09 MED ORDER — MIDAZOLAM HCL 2 MG/2ML IJ SOLN: 0.5000 mg | Freq: Once | INTRAMUSCULAR | Status: DC | PRN

## 2018-12-09 MED ORDER — CHLORHEXIDINE GLUCONATE CLOTH 2 % EX PADS: 6.0000 | MEDICATED_PAD | Freq: Once | CUTANEOUS | Status: DC

## 2018-12-09 MED ORDER — GLYCOPYRROLATE 0.2 MG/ML IJ SOLN
INTRAMUSCULAR | Status: DC | PRN
  Administered 2018-12-09: 0.2 mg via INTRAVENOUS

## 2018-12-09 MED ORDER — LIDOCAINE 2% (20 MG/ML) 5 ML SYRINGE
INTRAMUSCULAR | Status: AC
  Filled 2018-12-09: qty 5

## 2018-12-09 MED ORDER — HYDROCODONE-ACETAMINOPHEN 7.5-325 MG PO TABS: 1.0000 | ORAL_TABLET | Freq: Once | ORAL | Status: DC | PRN

## 2018-12-09 MED ORDER — KETAMINE HCL 10 MG/ML IJ SOLN
INTRAMUSCULAR | Status: DC | PRN
  Administered 2018-12-09: 20 mg via INTRAVENOUS

## 2018-12-09 MED ORDER — KETAMINE HCL 50 MG/5ML IJ SOSY
PREFILLED_SYRINGE | INTRAMUSCULAR | Status: AC
  Filled 2018-12-09: qty 5

## 2018-12-09 MED ORDER — PROPOFOL 10 MG/ML IV BOLUS
INTRAVENOUS | Status: DC | PRN
  Administered 2018-12-09: 20 mg via INTRAVENOUS

## 2018-12-09 MED ORDER — LIDOCAINE HCL (CARDIAC) PF 100 MG/5ML IV SOSY
PREFILLED_SYRINGE | INTRAVENOUS | Status: DC | PRN
  Administered 2018-12-09: 40 mg via INTRAVENOUS

## 2018-12-09 MED ORDER — PROPOFOL 500 MG/50ML IV EMUL
INTRAVENOUS | Status: DC | PRN
  Administered 2018-12-09: 150 ug/kg/min via INTRAVENOUS

## 2018-12-09 MED ORDER — LACTATED RINGERS IV SOLN
INTRAVENOUS | Status: DC
  Administered 2018-12-09: 1000 mL via INTRAVENOUS

## 2018-12-09 MED ORDER — PROMETHAZINE HCL 25 MG/ML IJ SOLN: 6.2500 mg | INTRAMUSCULAR | Status: DC | PRN

## 2018-12-09 NOTE — Transfer of Care (Signed)
Immediate Anesthesia Transfer of Care Note  Patient: Oscar Elliott  Procedure(s) Performed: COLONOSCOPY WITH PROPOFOL (N/A )  Patient Location: PACU  Anesthesia Type:General  Level of Consciousness: awake, alert , oriented and patient cooperative  Airway & Oxygen Therapy: Patient Spontanous Breathing  Post-op Assessment: Report given to RN and Post -op Vital signs reviewed and stable  Post vital signs: Reviewed and stable  Last Vitals:  Vitals Value Taken Time  BP 118/83 12/09/18 1501  Temp    Pulse    Resp 18 12/09/18 1506  SpO2    Vitals shown include unvalidated device data.  Last Pain:  Vitals:   12/09/18 1434  TempSrc:   PainSc: 3       Patients Stated Pain Goal: 6 (03/83/33 8329)  Complications: No apparent anesthesia complications

## 2018-12-09 NOTE — Anesthesia Preprocedure Evaluation (Addendum)
Anesthesia Evaluation  Patient identified by MRN, date of birth, ID band Patient awake    Reviewed: Allergy & Precautions, NPO status , Patient's Chart, lab work & pertinent test results, reviewed documented beta blocker date and time   Airway Mallampati: II  TM Distance: >3 FB Neck ROM: Full    Dental no notable dental hx. (+) Edentulous Upper, Edentulous Lower   Pulmonary neg pulmonary ROS, Current Smoker and Patient abstained from smoking.,    Pulmonary exam normal breath sounds clear to auscultation       Cardiovascular Exercise Tolerance: Poor hypertension, Pt. on medications and Pt. on home beta blockers + CAD, + Past MI, + Cardiac Stents and + Peripheral Vascular Disease  Normal cardiovascular examII Rhythm:Regular Rate:Normal  S/p AKA ? PVD + MI per chart Denies recent CP  Has SL NTG on list , but denies ever using    Neuro/Psych Schizophrenia negative neurological ROS  negative psych ROS   GI/Hepatic Neg liver ROS, Bowel prep,GERD  Medicated and Controlled,  Endo/Other  negative endocrine ROSdiabetes, Type 2, Oral Hypoglycemic Agents  Renal/GU Renal InsufficiencyRenal disease  negative genitourinary   Musculoskeletal negative musculoskeletal ROS (+)   Abdominal   Peds negative pediatric ROS (+)  Hematology negative hematology ROS (+)   Anesthesia Other Findings   Reproductive/Obstetrics negative OB ROS                            Anesthesia Physical Anesthesia Plan  ASA: III  Anesthesia Plan: General   Post-op Pain Management:    Induction: Intravenous  PONV Risk Score and Plan: 1 and Propofol infusion, TIVA and Treatment may vary due to age or medical condition  Airway Management Planned: Nasal Cannula and Simple Face Mask  Additional Equipment:   Intra-op Plan:   Post-operative Plan:   Informed Consent: I have reviewed the patients History and Physical, chart,  labs and discussed the procedure including the risks, benefits and alternatives for the proposed anesthesia with the patient or authorized representative who has indicated his/her understanding and acceptance.     Dental advisory given  Plan Discussed with: CRNA  Anesthesia Plan Comments: (Plan Full PPE use  Plan GA with GETA as needed d/w pt -WTP with same after Q&A)        Anesthesia Quick Evaluation

## 2018-12-09 NOTE — Op Note (Signed)
[facility] Patient Name: Oscar Elliott Procedure Date: 12/09/2018 2:00 PM MRN: [id number] Date of Birth: January 18, 1961 Attending MD: Barney Drain MD, MD CSN: [id number] Age: 58 Admit Type: Outpatient Procedure:                Colonoscopy, ABORTED DUE TO POOR BOWEL PREP Indications:              Screening for colorectal malignant neoplasm Providers:                Barney Drain MD, MD, Janeece Riggers, RN, Randa Spike, Technician Referring MD:             Lauretta Grill FNP, NP Medicines:                Propofol per Anesthesia Complications:            No immediate complications. Estimated Blood Loss:     Estimated blood loss: none. Procedure:                Pre-Anesthesia Assessment:                           - Prior to the procedure, a History and Physical                            was performed, and patient medications and                            allergies were reviewed. The patient's tolerance of                            previous anesthesia was also reviewed. The risks                            and benefits of the procedure and the sedation                            options and risks were discussed with the patient.                            All questions were answered, and informed consent                            was obtained. Prior Anticoagulants: The patient has                            taken no previous anticoagulant or antiplatelet                            agents except for aspirin. ASA Grade Assessment: II                            - A patient with mild systemic disease. After  reviewing the risks and benefits, the patient was                            deemed in satisfactory condition to undergo the                            procedure. After obtaining informed consent, the                            colonoscope was passed under direct vision.                            Throughout the procedure, the  patient's blood                            pressure, pulse, and oxygen saturations were                            monitored continuously. The CF-[id number] ([id number])                            scope was introduced through the anus and advanced                            to the the sigmoid colon. The colonoscopy was                            performed with difficulty due to inadequate bowel                            prep. The patient tolerated the procedure well. The                            quality of the bowel preparation was poor. Scope In: 2:42:52 PM Scope Out: 2:55:12 PM Total Procedure Duration: 0 hours 12 minutes 20 seconds  Findings:      The digital rectal exam findings include decreased sphincter tone and       non-thrombosed external hemorrhoids. Pertinent negatives include no       palpable rectal lesions.      The rectum, recto-sigmoid colon and sigmoid colon appeared normal. Impression:               - Preparation of the colon was poor.                           - Decreased sphincter tone and non-thrombosed                            external hemorrhoids found on digital rectal exam.                           - The rectum, recto-sigmoid colon and sigmoid colon                            are  normal. Moderate Sedation:      Per Anesthesia Care Recommendation:           - Patient has a contact number available for                            emergencies. The signs and symptoms of potential                            delayed complications were discussed with the                            patient. Return to normal activities tomorrow.                            Written discharge instructions were provided to the                            patient.                           - Resume previous diet.                           - Continue present medications.                           - Repeat colonoscopy at next available appointment                            (within 3 months)  for surveillance. Procedure Code(s):        --- Professional ---                           [phone], 53, Colonoscopy, flexible; diagnostic,                            including collection of specimen(s) by brushing or                            washing, when performed (separate procedure) Diagnosis Code(s):        --- Professional ---                           Z12.11, Encounter for screening for malignant                            neoplasm of colon                           K64.4, Residual hemorrhoidal skin tags                           K62.89, Other specified diseases of anus and rectum CPT copyright 2019 American Medical Association. All rights reserved. The codes documented in this report are preliminary and upon coder review may  be revised to meet current compliance requirements. [name], MD [name]  [name] MD, MD 12/09/2018 3:09:30 PM This report has been signed electronically. Number of Addenda: 0

## 2018-12-09 NOTE — H&P (Signed)
Primary Care Physician:  Lauretta Grill, NP Primary Gastroenterologist:  Dr. Oneida Alar  Pre-Procedure History & Physical: HPI:  Oscar Elliott is a 58 y.o. male here for COLON CANCER SCREENING.  Past Medical History:  Diagnosis Date  . CAD (coronary artery disease)    a. 10/2013 Lateral STEMI/PCI: LM nl, LAD min irregs, D1 occluded sub branch, LCX min irregs mid, OM large/patent, RCA 132m(2.75x28 Promus DES).  . Cholelithiasis   . Essential hypertension   . History of renal failure    a. 06/2007: in setting of rhabdo post L AKA.  .Marland KitchenHyperlipidemia   . [place] infarction) [place]   . Nephrolithiasis   . [place] disease ([place]    a. 06/2007: s/p L AKA @ [facility]  . [place]   . Type 2 diabetes mellitus [place]     Past Surgical History:  Procedure Laterality Date  . Above-the-knee amputation Left   . LEFT HEART CATHETERIZATION WITH CORONARY ANGIOGRAM N/A 11/20/2013   Procedure: LEFT HEART CATHETERIZATION WITH CORONARY ANGIOGRAM;  Surgeon: JJettie Booze MD;  [facility];  Service: Cardiovascular;  Laterality: N/A;    Prior to Admission medications   Medication Sig Start Date End Date Taking? Authorizing Provider  acetaminophen (TYLENOL) 500 MG tablet Take 500 mg by mouth every 4 (four) hours as needed for moderate pain or headache.   Yes [provider]  ARIPiprazole (ABILIFY) 15 MG tablet Take 1 tablet (15 mg total) by mouth daily. 10/15/18  Yes HNorman Clay MD  aspirin EC 81 MG tablet Take 81 mg by mouth daily.   Yes [provider]  atorvastatin (LIPITOR) 80 MG tablet Take 1 tablet (80 mg total) by mouth daily at 6 PM. 05/31/16  Yes MKathie Dike MD  Cholecalciferol (VITAMIN D) 50 MCG (2000 UT) CAPS Take 2,000 Units by mouth every Wednesday.    Yes [provider]  clonazePAM (KLONOPIN) 0.5 MG tablet Take 1 tablet (0.5 mg total) by mouth at bedtime. Patient taking differently: Take 0.5 mg by mouth daily at 8 pm.  (1900) 10/16/18  Yes Hisada, RElie Goody MD  clopidogrel (PLAVIX) 75 MG tablet TAKE (1) TABLET BY MOUTH ONCE DAILY. Patient taking differently: Take 75 mg by mouth daily. TAKE (1) TABLET BY MOUTH ONCE DAILY. 10/16/18  Yes MSatira Sark MD  cloZAPine (CLOZARIL) 100 MG tablet 100 mg in AM and 500 mg at night 12/12/18  Yes Hisada, Reina, MD  docusate sodium (COLACE) 100 MG capsule Take 100 mg by mouth 2 (two) times daily.    Yes [provider]  DULoxetine (CYMBALTA) 60 MG capsule Take 2 capsules (120 mg total) by mouth daily. 10/15/18  Yes HNorman Clay MD  famotidine (PEPCID) 20 MG tablet Take 20 mg by mouth daily as needed for heartburn or indigestion.   Yes [provider]  gabapentin (NEURONTIN) 100 MG capsule Take 100 mg by mouth 2 (two) times daily.   Yes [provider]  hydrOXYzine (ATARAX/VISTARIL) 25 MG tablet Take 25 mg by mouth 2 (two) times daily as needed for anxiety or itching.   Yes [provider]  insulin aspart (NOVOLOG) 100 UNIT/ML injection Inject 2 Units into the skin 3 (three) times daily before meals. Patient taking differently: Inject 2 Units into the skin 3 (three) times daily as needed (if BS > 200 and resident eats a meal. Hold if resident does not eat.).  06/01/16  Yes Memon, JJolaine Artist MD  insulin detemir (LEVEMIR) 100 UNIT/ML injection Inject 0.3  mLs (30 Units total) into the skin daily. Patient taking differently: Inject 35 Units into the skin every evening. Hold if pt does not eat . 05/31/16  Yes Kathie Dike, MD  ketoconazole (NIZORAL) 2 % shampoo Apply 1 application topically 2 (two) times a week.   Yes [provider]  latanoprost (XALATAN) 0.005 % ophthalmic solution Place 1 drop into both eyes at bedtime.   Yes [provider]  lisinopril (ZESTRIL) 10 MG tablet Take 10 mg by mouth daily.    Yes [provider]  metoprolol (LOPRESSOR) 50 MG tablet Take 1 tablet (50 mg total) by mouth 2 (two) times daily.  12/02/13  Yes Imogene Burn, PA-C  mupirocin ointment (BACTROBAN) 2 % Place 1 application into the nose 2 (two) times daily.   Yes [provider]  Na Sulfate-K Sulfate-Mg Sulf (SUPREP BOWEL PREP KIT) 17.5-3.13-1.6 GM/177ML SOLN Take 1 kit by mouth as directed. 03/21/18  Yes Rielyn Krupinski L, MD  omeprazole (PRILOSEC) 40 MG capsule Take 40 mg by mouth 2 (two) times daily.    Yes [provider]  polyethylene glycol (MIRALAX / GLYCOLAX) 17 g packet Take 17 g by mouth daily as needed for moderate constipation.   Yes [provider]  tamsulosin (FLOMAX) 0.4 MG CAPS capsule Take 0.4 mg by mouth daily.    Yes [provider]  nitroGLYCERIN (NITROSTAT) 0.4 MG SL tablet Place 1 tablet (0.4 mg total) under the tongue every 5 (five) minutes x 3 doses as needed for chest pain. 11/23/13   Barrett, Evelene Croon, PA-C    Allergies as of 09/16/2018 - Review Complete 09/10/2018  Allergen Reaction Noted  . Penicillins Other (See Comments) 11/20/2013    Family History  Problem Relation Age of Onset  . Other Other        no premature CAD.    Social History   Socioeconomic History  . Marital status: Single    Spouse name: Not on file  . Number of children: Not on file  . Years of education: Not on file  . Highest education level: Not on file  Occupational History  . Not on file  Social Needs  . Financial resource strain: Not on file  . Food insecurity    Worry: Not on file    Inability: Not on file  . Transportation needs    Medical: Not on file    Non-medical: Not on file  Tobacco Use  . Smoking status: Current Every Day Smoker    Packs/day: 0.25    Years: 20.00    Pack years: 5.00    Types: Cigars  . Smokeless tobacco: Never Used  . Tobacco comment: 1-2 cig daily  Substance and Sexual Activity  . Alcohol use: No    Alcohol/week: 0.0 standard drinks  . Drug use: No  . Sexual activity: Never  Lifestyle  . Physical activity    Days per week: Not on file     Minutes per session: Not on file  . Stress: Not on file  Relationships  . Social Herbalist on phone: Not on file    Gets together: Not on file    Attends religious service: Not on file    Active member of club or organization: Not on file    Attends meetings of clubs or organizations: Not on file    Relationship status: Not on file  . Intimate partner violence    Fear of current or ex partner: Not  on file    Emotionally abused: Not on file    Physically abused: Not on file    Forced sexual activity: Not on file  Other Topics Concern  . Not on file  Social History Narrative   Single, no children    Review of Systems: See HPI, otherwise negative ROS   Physical Exam: BP 120/80   Pulse 79   Temp 98.5 F (36.9 C) (Oral)   Resp 20   SpO2 95%  General:   Alert,  pleasant and cooperative in NAD Head:  Normocephalic and atraumatic. Neck:  Supple; Lungs:  Clear throughout to auscultation.    Heart:  Regular rate and rhythm. Abdomen:  Soft, nontender and nondistended. Normal bowel sounds, without guarding, and without rebound.   Neurologic:  Alert and  oriented x4;  grossly normal neurologically.  Impression/Plan:    SCREENING  Plan:  1. TCS TODAY DISCUSSED PROCEDURE, BENEFITS, & RISKS: < 1% chance of medication reaction, bleeding, perforation, ASPIRATION, or rupture of spleen/liver requiring surgery to fix it and missed polyps < 1 cm 10-20% of the time.

## 2018-12-09 NOTE — Anesthesia Postprocedure Evaluation (Signed)
Anesthesia Post Note  Patient: Oscar Elliott  Procedure(s) Performed: COLONOSCOPY WITH PROPOFOL (N/A )  Patient location during evaluation: PACU Anesthesia Type: General Level of consciousness: awake and alert, oriented and patient cooperative Pain management: pain level controlled Vital Signs Assessment: post-procedure vital signs reviewed and stable Respiratory status: spontaneous breathing Cardiovascular status: stable Postop Assessment: no apparent nausea or vomiting Anesthetic complications: no     Last Vitals:  Vitals:   12/09/18 1306  BP: 120/80  Pulse: 79  Resp: 20  Temp: 36.9 C  SpO2: 95%    Last Pain:  Vitals:   12/09/18 1434  TempSrc:   PainSc: 3                  ADAMS, AMY A

## 2018-12-09 NOTE — Anesthesia Procedure Notes (Signed)
Procedure Name: General with mask airway Date/Time: 12/09/2018 2:31 PM Performed by: Andree Elk, Aviela Blundell A, CRNA Pre-anesthesia Checklist: Timeout performed, Patient being monitored, Suction available, Emergency Drugs available and Patient identified Patient Re-evaluated:Patient Re-evaluated prior to induction Oxygen Delivery Method: Non-rebreather mask

## 2018-12-09 NOTE — Discharge Instructions (Signed)
Your prep was not good. YOU NEED TO COME BACK.  WE WILL SCHEDULE YOUR COLONOSCOPY WITHIN THE NEXT 2-3 mos.   HE will need to FOLLOW full liquids 2 days before you procedure. Take Miralax 1 cap in 8 oz every hour for 4 hours. Drink 8 oz of liquid 30 minutes after each dose of Miralax. Clear liquids on the day before your procedure, BOWEL PREP on day prior to procedure. Dulcolax 10 mg 2 hour after starting Moviprep and repeat 2 hours after your first does of Dulcolax.   Monitored Anesthesia Care, Care After These instructions provide you with information about caring for yourself after your procedure. Your health care provider may also give you more specific instructions. Your treatment has been planned according to current medical practices, but problems sometimes occur. Call your health care provider if you have any problems or questions after your procedure. What can I expect after the procedure? After your procedure, you may:  Feel sleepy for several hours.  Feel clumsy and have poor balance for several hours.  Feel forgetful about what happened after the procedure.  Have poor judgment for several hours.  Feel nauseous or vomit.  Have a sore throat if you had a breathing tube during the procedure. Follow these instructions at home: For at least 24 hours after the procedure:      Have a responsible adult stay with you. It is important to have someone help care for you until you are awake and alert.  Rest as needed.  Do not: ? Participate in activities in which you could fall or become injured. ? Drive. ? Use heavy machinery. ? Drink alcohol. ? Take sleeping pills or medicines that cause drowsiness. ? Make important decisions or sign legal documents. ? Take care of children on your own. Eating and drinking  Follow the diet that is recommended by your health care provider.  If you vomit, drink water, juice, or soup when you can drink without vomiting.  Make sure you have  little or no nausea before eating solid foods. General instructions  Take over-the-counter and prescription medicines only as told by your health care provider.  If you have sleep apnea, surgery and certain medicines can increase your risk for breathing problems. Follow instructions from your health care provider about wearing your sleep device: ? Anytime you are sleeping, including during daytime naps. ? While taking prescription pain medicines, sleeping medicines, or medicines that make you drowsy.  If you smoke, do not smoke without supervision.  Keep all follow-up visits as told by your health care provider. This is important. Contact a health care provider if:  You keep feeling nauseous or you keep vomiting.  You feel light-headed.  You develop a rash.  You have a fever. Get help right away if:  You have trouble breathing. Summary  For several hours after your procedure, you may feel sleepy and have poor judgment.  Have a responsible adult stay with you for at least 24 hours or until you are awake and alert. This information is not intended to replace advice given to you by your health care provider. Make sure you discuss any questions you have with your health care provider. Document Released: 06/05/2015 Document Revised: 05/13/2017 Document Reviewed: 06/05/2015 Elsevier Patient Education  2020 [employer].  Colonoscopy, Adult, Care After This sheet gives you information about how to care for yourself after your procedure. Your doctor may also give you more specific instructions. If you have problems or questions, call  your doctor. What can I expect after the procedure? After the procedure, it is common to have:  A small amount of blood in your poop for 24 hours.  Some gas.  Mild cramping or bloating in your belly. Follow these instructions at home: General instructions  For the first 24 hours after the procedure: ? Do not drive or use machinery. ? Do not sign  important documents. ? Do not drink alcohol. ? Do your daily activities more slowly than normal. ? Eat foods that are soft and easy to digest.  Take over-the-counter or prescription medicines only as told by your doctor. To help cramping and bloating:   Try walking around.  Put heat on your belly (abdomen) as told by your doctor. Use a heat source that your doctor recommends, such as a moist heat pack or a heating pad. ? Put a towel between your skin and the heat source. ? Leave the heat on for 20-30 minutes. ? Remove the heat if your skin turns bright red. This is especially important if you cannot feel pain, heat, or cold. You can get burned. Eating and drinking   Drink enough fluid to keep your pee (urine) clear or pale yellow.  Return to your normal diet as told by your doctor. Avoid heavy or fried foods that are hard to digest.  Avoid drinking alcohol for as long as told by your doctor. Contact a doctor if:  You have blood in your poop (stool) 2-3 days after the procedure. Get help right away if:  You have more than a small amount of blood in your poop.  You see large clumps of tissue (blood clots) in your poop.  Your belly is swollen.  You feel sick to your stomach (nauseous).  You throw up (vomit).  You have a fever.  You have belly pain that gets worse, and medicine does not help your pain. Summary  After the procedure, it is common to have a small amount of blood in your poop. You may also have mild cramping and bloating in your belly.  For the first 24 hours after the procedure, do not drive or use machinery, do not sign important documents, and do not drink alcohol.  Get help right away if you have a lot of blood in your poop, feel sick to your stomach, have a fever, or have more belly pain. This information is not intended to replace advice given to you by your health care provider. Make sure you discuss any questions you have with your health care  provider. Document Released: 03/17/2010 Document Revised: 12/13/2016 Document Reviewed: 11/07/2015 Elsevier Patient Education  2020 [employer].

## 2018-12-10 NOTE — Progress Notes (Signed)
cc'd to pcp 

## 2018-12-16 ENCOUNTER — Encounter (HOSPITAL_COMMUNITY): Payer: Self-pay | Admitting: Gastroenterology

## 2018-12-22 NOTE — Progress Notes (Signed)
Virtual Visit via Telephone Note  I connected with Oscar Elliott on 12/29/18 at 11:00 AM EST by telephone and verified that I am speaking with the correct person using two identifiers.   I discussed the limitations, risks, security and privacy concerns of performing an evaluation and management service by telephone and the availability of in person appointments. I also discussed with the patient that there may be a patient responsible charge related to this service. The patient expressed understanding and agreed to proceed.     I discussed the assessment and treatment plan with the patient. The patient was provided an opportunity to ask questions and all were answered. The patient agreed with the plan and demonstrated an understanding of the instructions.   The patient was advised to call back or seek an in-person evaluation if the symptoms worsen or if the condition fails to improve as anticipated.  I provided 15 minutes of non-face-to-face time during this encounter.   Neysa Hotter, MD     [facility] MD/PA/NP OP Progress Note  12/29/2018 11:12 AM Oscar Elliott  MRN:  [id number]  Chief Complaint:  Chief Complaint    Follow-up; Schizophrenia     HPI:  This is a follow-up appointment for schizophrenia.  He states that he has been doing good.  He enjoys doing crafts. He went to the hospital for some issues, and was found out to be "fine." He talks about the driver, who brings him to the appointment.  He believes that things has been going well for him.  He denies feeling depressed or anxiety.  He denies SI, HI, or hallucinations.  He denies paranoia.  He denies ideas of reference.   The supervisor at [facility] family care presents to the interview.  She states that there has been no change. He enjoys watching TV. He comes out of the room and enjoys going outside, although his mobility is limited due to amputation. He sleeps well. He has good appetite. No safety concern. He takes  medication regularly.   Visit Diagnosis:    ICD-10-CM   1. Schizophrenia, unspecified type (HCC)  F20.9     Past Psychiatric History: Please see initial evaluation for full details. I have reviewed the history. No updates at this time.     Past Medical History:  Past Medical History:  Diagnosis Date  . CAD (coronary artery disease)    a. 10/2013 Lateral STEMI/PCI: LM nl, LAD min irregs, D1 occluded sub branch, LCX min irregs mid, OM large/patent, RCA 126m (2.75x28 Promus DES).  . Cholelithiasis   . Essential hypertension   . History of renal failure    a. 06/2007: in setting of rhabdo post L AKA.  Marland Kitchen Hyperlipidemia   . MI (myocardial infarction) (HCC)   . Nephrolithiasis   . Peripheral vascular disease (HCC)    a. 06/2007: s/p L AKA @ [facility]  . Schizophrenia (HCC)   . Type 2 diabetes mellitus (HCC)     Past Surgical History:  Procedure Laterality Date  . Above-the-knee amputation Left   . COLONOSCOPY WITH PROPOFOL N/A 12/09/2018   Procedure: COLONOSCOPY WITH PROPOFOL;  Surgeon: West Bali, MD;  Location: [facility]O [facility];  Service: Endoscopy;  Laterality: N/A;  2:15pm  . LEFT HEART CATHETERIZATION WITH CORONARY ANGIOGRAM N/A 11/20/2013   Procedure: LEFT HEART CATHETERIZATION WITH CORONARY ANGIOGRAM;  Surgeon: Corky Crafts, MD;  Location: [facility] CATH LAB;  Service: Cardiovascular;  Laterality: N/A;    Family Psychiatric History: Please see initial evaluation for full  details. I have reviewed the history. No updates at this time.     Family History:  Family History  Problem Relation Age of Onset  . Other Other        no premature CAD.    Social History:  Social History   Socioeconomic History  . Marital status: Single    Spouse name: Not on file  . Number of children: Not on file  . Years of education: Not on file  . Highest education level: Not on file  Occupational History  . Not on file  Social Needs  . Financial resource strain: Not on file  . Food  insecurity    Worry: Not on file    Inability: Not on file  . Transportation needs    Medical: Not on file    Non-medical: Not on file  Tobacco Use  . Smoking status: Current Every Day Smoker    Packs/day: 0.25    Years: 20.00    Pack years: 5.00    Types: Cigars  . Smokeless tobacco: Never Used  . Tobacco comment: 1-2 cig daily  Substance and Sexual Activity  . Alcohol use: No    Alcohol/week: 0.0 standard drinks  . Drug use: No  . Sexual activity: Never  Lifestyle  . Physical activity    Days per week: Not on file    Minutes per session: Not on file  . Stress: Not on file  Relationships  . Social Musicianconnections    Talks on phone: Not on file    Gets together: Not on file    Attends religious service: Not on file    Active member of club or organization: Not on file    Attends meetings of clubs or organizations: Not on file    Relationship status: Not on file  Other Topics Concern  . Not on file  Social History Narrative   Single, no children    Allergies:  Allergies  Allergen Reactions  . Penicillins Other (See Comments)    Reaction unknown    Metabolic Disorder Labs: Lab Results  Component Value Date   HGBA1C 5.8 (H) 02/23/2014   MPG 120 (H) 02/23/2014   No results found for: PROLACTIN Lab Results  Component Value Date   CHOL 102 11/21/2013   TRIG 66 11/21/2013   HDL 27 (L) 11/21/2013   CHOLHDL 3.8 11/21/2013   VLDL 13 11/21/2013   LDLCALC 62 11/21/2013   Lab Results  Component Value Date   TSH 1.490 11/21/2013    Therapeutic Level Labs: No results found for: LITHIUM No results found for: VALPROATE No components found for:  CBMZ  Current Medications: Current Outpatient Medications  Medication Sig Dispense Refill  . acetaminophen (TYLENOL) 500 MG tablet Take 500 mg by mouth every 4 (four) hours as needed for moderate pain or headache.    . ARIPiprazole (ABILIFY) 15 MG tablet Take 1 tablet (15 mg total) by mouth daily. 90 tablet 0  . aspirin  EC 81 MG tablet Take 81 mg by mouth daily.    Marland Kitchen. atorvastatin (LIPITOR) 80 MG tablet Take 1 tablet (80 mg total) by mouth daily at 6 PM. 30 tablet 11  . Cholecalciferol (VITAMIN D) 50 MCG (2000 UT) CAPS Take 2,000 Units by mouth every Wednesday.     . clonazePAM (KLONOPIN) 0.5 MG tablet Take 1 tablet (0.5 mg total) by mouth at bedtime. (Patient taking differently: Take 0.5 mg by mouth daily at 8 pm. (1900)) 30 tablet 2  .  clopidogrel (PLAVIX) 75 MG tablet TAKE (1) TABLET BY MOUTH ONCE DAILY. (Patient taking differently: Take 75 mg by mouth daily. TAKE (1) TABLET BY MOUTH ONCE DAILY.) 30 tablet 0  . cloZAPine (CLOZARIL) 100 MG tablet 100 mg in AM and 500 mg at night 180 tablet 0  . docusate sodium (COLACE) 100 MG capsule Take 100 mg by mouth 2 (two) times daily.     . DULoxetine (CYMBALTA) 60 MG capsule Take 2 capsules (120 mg total) by mouth daily. 180 capsule 0  . famotidine (PEPCID) 20 MG tablet Take 20 mg by mouth daily as needed for heartburn or indigestion.    . gabapentin (NEURONTIN) 100 MG capsule Take 100 mg by mouth 2 (two) times daily.    . hydrOXYzine (ATARAX/VISTARIL) 25 MG tablet Take 25 mg by mouth 2 (two) times daily as needed for anxiety or itching.    . insulin aspart (NOVOLOG) 100 UNIT/ML injection Inject 2 Units into the skin 3 (three) times daily before meals. (Patient taking differently: Inject 2 Units into the skin 3 (three) times daily as needed (if BS > 200 and resident eats a meal. Hold if resident does not eat.). ) 10 mL 11  . insulin detemir (LEVEMIR) 100 UNIT/ML injection Inject 0.3 mLs (30 Units total) into the skin daily. (Patient taking differently: Inject 35 Units into the skin every evening. Hold if pt does not eat .) 10 mL 11  . ketoconazole (NIZORAL) 2 % shampoo Apply 1 application topically 2 (two) times a week.    . latanoprost (XALATAN) 0.005 % ophthalmic solution Place 1 drop into both eyes at bedtime.    Marland Kitchen lisinopril (ZESTRIL) 10 MG tablet Take 10 mg by mouth  daily.     . metoprolol (LOPRESSOR) 50 MG tablet Take 1 tablet (50 mg total) by mouth 2 (two) times daily. 180 tablet 3  . mupirocin ointment (BACTROBAN) 2 % Place 1 application into the nose 2 (two) times daily.    . nitroGLYCERIN (NITROSTAT) 0.4 MG SL tablet Place 1 tablet (0.4 mg total) under the tongue every 5 (five) minutes x 3 doses as needed for chest pain. 25 tablet 12  . omeprazole (PRILOSEC) 40 MG capsule Take 40 mg by mouth 2 (two) times daily.     . tamsulosin (FLOMAX) 0.4 MG CAPS capsule Take 0.4 mg by mouth daily.      No current facility-administered medications for this visit.      Musculoskeletal: Strength & Muscle Tone: N/A Gait & Station: N/A Patient leans: N/A  Psychiatric Specialty Exam: Review of Systems  Psychiatric/Behavioral: Negative for depression, hallucinations, memory loss, substance abuse and suicidal ideas. The patient is not nervous/anxious and does not have insomnia.   All other systems reviewed and are negative.   There were no vitals taken for this visit.There is no height or weight on file to calculate BMI.  General Appearance: NA  Eye Contact:  NA  Speech:  Clear and Coherent  Volume:  Normal  Mood:  "good"  Affect:  NA  Thought Process:  Coherent  Orientation:  Full (Time, Place, and Person)  Thought Content: Logical   Suicidal Thoughts:  No  Homicidal Thoughts:  No  Memory:  Immediate;   Good  Judgement:  Good  Insight:  Fair  Psychomotor Activity:  Normal  Concentration:  Concentration: Good and Attention Span: Good  Recall:  Good  Fund of Knowledge: Good  Language: Good  Akathisia:  No  Handed:  Right  AIMS (if  indicated): not done  Assets:  Communication Skills Desire for Improvement  ADL's:  Intact  Cognition: WNL  Sleep:  Good   Screenings:   Assessment and Plan:  Oscar Elliott is a 58 y.o. year old male with a history of schizophrenia,,chronic renal disease,, CADwith history of DES to the RCA, PAD s/p left AKA,  hypertension, hyperlipidemia  , who presents for follow up appointment for Schizophrenia, unspecified type [facility])  # Schizophrenia He continues to demonstrate complete linear thought process, which has been consistent since the initial evaluation.  Staff denies any significant change or behavior issues since the last visit.  We will continue clozapine at the current dose to target schizophrenia.  Discussed risks, which includes but not limited to constipation, metabolic side effect and agranulocytosis.  We will continue to monitor blood test.  We will continue Abilify for schizophrenia.  Although it is preferable to try monotherapy, he will benefit from this combination to avoid relapsing schizophrenia.  Will continue duloxetine for depression.  Will continue clonazepam as needed for anxiety.  Discussed risk of oversedation.   Plan I have reviewed and updated plans as below 1.ContinueClozapine 100 mg and 500 at night 2. Continue Abilify 15 mg daily  3. ContinueDuloxetine 120 mg daily 4.ContinueClonazepam 0.5 mg daily 5. Obtain lab (CBC) for monthly monitoring 6.Next appointment: in January - he is advised to get [place] phone: [phone]  The patient demonstrates the following risk factors for suicide: Chronic risk factors for suicide include:psychiatric disorder ofschizophrenia. Acute risk factorsfor suicide include: unemployment. Protective factorsfor this patient include: positive social support. Considering these factors, the overall suicide risk at this point appears to below. Patientisappropriate for outpatient follow up.  Norman Clay, MD 12/29/2018, 11:12 AM

## 2018-12-24 ENCOUNTER — Telehealth: Payer: Self-pay | Admitting: Cardiology

## 2018-12-24 NOTE — Telephone Encounter (Signed)
Virtual Visit Pre-Appointment Phone Call  "(Name), I am calling you today to discuss your upcoming appointment. We are currently trying to limit exposure to the virus that causes COVID-19 by seeing patients at home rather than in the office."  1. "What is the BEST phone number to call the day of the visit?" - include this in appointment notes  2. Do you have or have access to (through a family member/friend) a smartphone with video capability that we can use for your visit?" a. If yes - list this number in appt notes as cell (if different from BEST phone #) and list the appointment type as a VIDEO visit in appointment notes b. If no - list the appointment type as a PHONE visit in appointment notes  Confirm consent - "In the setting of the current Covid19 crisis, you are scheduled for a (phone or video) visit with your provider on (date) at (time).  Just as we do with many in-office visits, in order for you to participate in this visit, we must obtain consent.  If you'd like, I can send this to your mychart (if signed up) or email for you to review.  Otherwise, I can obtain your verbal consent now.  All virtual visits are billed to your insurance company just like a normal visit would be.  By agreeing to a virtual visit, we'd like you to understand that the technology does not allow for your provider to perform an examination, and thus may limit your provider's ability to fully assess your condition. If your provider identifies any concerns that need to be evaluated in person, we will make arrangements to do so.  Finally, though the technology is pretty good, we cannot assure that it will always work on either your or our end, and in the setting of a video visit, we may have to convert it to a phone-only visit.  In either situation, we cannot ensure that we have a secure connection.  Are you willing to proceed?" STAFF: Did the patient verbally acknowledge consent to telehealth visit? Document  YES/NO here: yes 3. Advise patient to be prepared - "Two hours prior to your appointment, go ahead and check your blood pressure, pulse, oxygen saturation, and your weight (if you have the equipment to check those) and write them all down. When your visit starts, your provider will ask you for this information. If you have an Apple Watch or Kardia device, please plan to have heart rate information ready on the day of your appointment. Please have a pen and paper handy nearby the day of the visit as well."  4. Give patient instructions for MyChart download to smartphone OR Doximity/[email or web address] as below if video visit (depending on what platform provider is using)  5. Inform patient they will receive a phone call 15 minutes prior to their appointment time (may be from unknown caller ID) so they should be prepared to answer    TELEPHONE CALL NOTE  Oscar Elliott has been deemed a candidate for a follow-up tele-health visit to limit community exposure during the Covid-19 pandemic. I spoke with the patient via phone to ensure availability of phone/video source, confirm preferred email & phone number, and discuss instructions and expectations.  I reminded Oscar Elliott to be prepared with any vital sign and/or heart rhythm information that could potentially be obtained via home monitoring, at the time of his visit. I reminded Oscar Elliott to expect a phone call prior to his visit.  Ailene Rud  Katrinka Blazing 12/24/2018 4:05 PM   INSTRUCTIONS FOR DOWNLOADING THE MYCHART APP TO SMARTPHONE  - The patient must first make sure to have activated MyChart and know their login information - If Apple, go to [employer] and type in MyChart in the search bar and download the app. If Android, ask patient to go to [employer] and type in [facility] in the search bar and download the app. The app is free but as with any other app downloads, their phone may require them to verify saved payment information or Apple/Android  password.  - The patient will need to then log into the app with their MyChart username and password, and select [facility] as their healthcare provider to link the account. When it is time for your visit, go to the MyChart app, find appointments, and click Begin Video Visit. Be sure to Select Allow for your device to access the Microphone and Camera for your visit. You will then be connected, and your provider will be with you shortly.  **If they have any issues connecting, or need assistance please contact MyChart service desk [phone])**  **If using a computer, in order to ensure the best quality for their visit they will need to use either of the following Internet Browsers: [employer] or Google Chrome**  IF USING DOXIMITY or DOXY.ME - The patient will receive a link just prior to their visit by text.     FULL LENGTH CONSENT FOR TELE-HEALTH VISIT   I hereby voluntarily request, consent and authorize [facility] and its employed or contracted physicians, physician assistants, nurse practitioners or other licensed health care professionals (the Practitioner), to provide me with telemedicine health care services (the Services") as deemed necessary by the treating Practitioner. I acknowledge and consent to receive the Services by the Practitioner via telemedicine. I understand that the telemedicine visit will involve communicating with the Practitioner through live audiovisual communication technology and the disclosure of certain medical information by electronic transmission. I acknowledge that I have been given the opportunity to request an in-person assessment or other available alternative prior to the telemedicine visit and am voluntarily participating in the telemedicine visit.  I understand that I have the right to withhold or withdraw my consent to the use of telemedicine in the course of my care at any time, without affecting my right to future care or treatment,  and that the Practitioner or I may terminate the telemedicine visit at any time. I understand that I have the right to inspect all information obtained and/or recorded in the course of the telemedicine visit and may receive copies of available information for a reasonable fee.  I understand that some of the potential risks of receiving the Services via telemedicine include:   Delay or interruption in medical evaluation due to technological equipment failure or disruption;  Information transmitted may not be sufficient (e.g. poor resolution of images) to allow for appropriate medical decision making by the Practitioner; and/or   In rare instances, security protocols could fail, causing a breach of personal health information.  Furthermore, I acknowledge that it is my responsibility to provide information about my medical history, conditions and care that is complete and accurate to the best of my ability. I acknowledge that Practitioner's advice, recommendations, and/or decision may be based on factors not within their control, such as incomplete or inaccurate data provided by me or distortions of diagnostic images or specimens that may result from electronic transmissions. I understand that the practice of  medicine is not an Chief Strategy Officer and that Practitioner makes no warranties or guarantees regarding treatment outcomes. I acknowledge that I will receive a copy of this consent concurrently upon execution via email to the email address I last provided but may also request a printed copy by calling the office of [facility].    I understand that my insurance will be billed for this visit.   I have read or had this consent read to me.  I understand the contents of this consent, which adequately explains the benefits and risks of the Services being provided via telemedicine.   I have been provided ample opportunity to ask questions regarding this consent and the Services and have had my questions  answered to my satisfaction.  I give my informed consent for the services to be provided through the use of telemedicine in my medical care  By participating in this telemedicine visit I agree to the above.

## 2018-12-29 ENCOUNTER — Ambulatory Visit (INDEPENDENT_AMBULATORY_CARE_PROVIDER_SITE_OTHER): Payer: Medicaid Other | Admitting: Psychiatry

## 2018-12-29 ENCOUNTER — Encounter (HOSPITAL_COMMUNITY): Payer: Self-pay | Admitting: Psychiatry

## 2018-12-29 ENCOUNTER — Other Ambulatory Visit: Payer: Self-pay

## 2018-12-29 DIAGNOSIS — F209 Schizophrenia, unspecified: Secondary | ICD-10-CM | POA: Diagnosis not present

## 2018-12-29 MED ORDER — ARIPIPRAZOLE 15 MG PO TABS: 15.0000 mg | ORAL_TABLET | Freq: Every day | ORAL | 1 refills | Status: DC

## 2018-12-29 MED ORDER — DULOXETINE HCL 60 MG PO CPEP: 120.0000 mg | ORAL_CAPSULE | Freq: Every day | ORAL | 1 refills | Status: DC

## 2018-12-29 MED ORDER — CLONAZEPAM 0.5 MG PO TABS: 0.5000 mg | ORAL_TABLET | Freq: Every day | ORAL | 3 refills | Status: DC

## 2018-12-29 NOTE — Patient Instructions (Signed)
1.ContinueClozapine 100 mg and 500 at night 2. Continue Abilify 15 mg daily 3. ContinueDuloxetine 120 mg daily 4.ContinueClonazepam 0.5 mg daily 5. Obtain lab (CBC) for monthly monitoring 6.Next appointment:in January

## 2018-12-29 NOTE — Addendum Note (Signed)
Addended by: Norman Clay on: 12/29/2018 11:21 AM   Modules accepted: Orders

## 2019-01-04 NOTE — Progress Notes (Signed)
Virtual Visit via Telephone Note   This visit type was conducted due to national recommendations for restrictions regarding the COVID-19 Pandemic (e.g. social distancing) in an effort to limit this patient's exposure and mitigate transmission in our community.  Due to his co-morbid illnesses, this patient is at least at moderate risk for complications without adequate follow up.  This format is felt to be most appropriate for this patient at this time.  The patient did not have access to video technology/had technical difficulties with video requiring transitioning to audio format only (telephone).  All issues noted in this document were discussed and addressed.  No physical exam could be performed with this format.  Please refer to the patient's chart for his  consent to telehealth for [facility].   Date:  01/05/2019   ID:  Oscar BollAndrew Elliott, DOB 1960/12/15, MRN [id number]  [facility] Provider Location: Office  PCP:  Anselm JunglingHatchett, Mary, NP  Cardiologist:  Nona DellSamuel Ada Holness, MD Electrophysiologist:  None   Evaluation Performed:  Follow-Up Visit  Chief Complaint:   Cardiac follow-up  History of Present Illness:    Oscar Elliott is a 58 y.o. male last seen in September 2019.  [facility].  We spoke by phone today.  He was sitting on the porch in the sun.  He does not report any angina symptoms or nitroglycerin use.  Indicates NYHA class II dyspnea based on description of activities.  He is reported to maintain regular follow-up with PCP.  I reviewed his medications which are outlined below.  Current cardiac regimen includes aspirin, Lipitor, Plavix, lisinopril, Lopressor, and as needed nitroglycerin.  I reviewed his last ECG as outlined below.  The patient does not have symptoms concerning for COVID-19 infection (fever, chills, cough, or new shortness of breath).    Past Medical History:  Diagnosis Date   . CAD (coronary artery disease)    a. 10/2013 Lateral STEMI/PCI: LM nl, LAD min irregs, D1 occluded sub branch, LCX min irregs mid, OM large/patent, RCA 1533m (2.75x28 Promus DES).  . Cholelithiasis   . Essential hypertension   . History of renal failure    a. 06/2007: in setting of rhabdo post L AKA.  Marland Kitchen. Hyperlipidemia   . MI (myocardial infarction) (HCC)   . Nephrolithiasis   . Peripheral vascular disease (HCC)    a. 06/2007: s/p L AKA @ [facility]  . Schizophrenia (HCC)   . Type 2 diabetes mellitus (HCC)    Past Surgical History:  Procedure Laterality Date  . Above-the-knee amputation Left   . COLONOSCOPY WITH PROPOFOL N/A 12/09/2018   Procedure: COLONOSCOPY WITH PROPOFOL;  Surgeon: West BaliFields, Sandi L, MD;  [facility];  Service: Endoscopy;  Laterality: N/A;  2:15pm  . LEFT HEART CATHETERIZATION WITH CORONARY ANGIOGRAM N/A 11/20/2013   Procedure: LEFT HEART CATHETERIZATION WITH CORONARY ANGIOGRAM;  Surgeon: Corky CraftsJayadeep S Varanasi, MD;  [facility];  Service: Cardiovascular;  Laterality: N/A;     Current Meds  Medication Sig  . acetaminophen (TYLENOL) 500 MG tablet Take 500 mg by mouth every 4 (four) hours as needed for moderate pain or headache.  Melene Muller. [START ON 01/15/2019] ARIPiprazole (ABILIFY) 15 MG tablet Take 1 tablet (15 mg total) by mouth daily.  Marland Kitchen. aspirin EC 81 MG tablet Take 81 mg by mouth daily.  Marland Kitchen. atorvastatin (LIPITOR) 80 MG tablet Take 1 tablet (80 mg total) by mouth daily at 6 PM.  .  Cholecalciferol (VITAMIN D) 50 MCG (2000 UT) CAPS Take 2,000 Units by mouth every Wednesday.   Melene Muller ON 01/12/2019] clonazePAM (KLONOPIN) 0.5 MG tablet Take 1 tablet (0.5 mg total) by mouth at bedtime.  . clopidogrel (PLAVIX) 75 MG tablet TAKE (1) TABLET BY MOUTH ONCE DAILY. (Patient taking differently: Take 75 mg by mouth daily. TAKE (1) TABLET BY MOUTH ONCE DAILY.)  . cloZAPine (CLOZARIL) 100 MG tablet 100 mg in AM and 500 mg at night  . docusate sodium (COLACE) 100 MG capsule Take  100 mg by mouth 2 (two) times daily.   Melene Muller ON 01/13/2019] DULoxetine (CYMBALTA) 60 MG capsule Take 2 capsules (120 mg total) by mouth daily.  . famotidine (PEPCID) 20 MG tablet Take 20 mg by mouth daily as needed for heartburn or indigestion.  . gabapentin (NEURONTIN) 100 MG capsule Take 100 mg by mouth 2 (two) times daily.  . hydrOXYzine (ATARAX/VISTARIL) 25 MG tablet Take 25 mg by mouth 2 (two) times daily as needed for anxiety or itching.  . insulin aspart (NOVOLOG) 100 UNIT/ML injection Inject 2 Units into the skin 3 (three) times daily before meals. (Patient taking differently: Inject 2 Units into the skin 3 (three) times daily as needed (if BS > 200 and resident eats a meal. Hold if resident does not eat.). )  . insulin detemir (LEVEMIR) 100 UNIT/ML injection Inject 0.3 mLs (30 Units total) into the skin daily. (Patient taking differently: Inject 35 Units into the skin every evening. Hold if pt does not eat .)  . ketoconazole (NIZORAL) 2 % shampoo Apply 1 application topically 2 (two) times a week.  . latanoprost (XALATAN) 0.005 % ophthalmic solution Place 1 drop into both eyes at bedtime.  Marland Kitchen lisinopril (ZESTRIL) 10 MG tablet Take 10 mg by mouth daily.   . metoprolol (LOPRESSOR) 50 MG tablet Take 1 tablet (50 mg total) by mouth 2 (two) times daily.  . mupirocin ointment (BACTROBAN) 2 % Place 1 application into the nose 2 (two) times daily.  . nitroGLYCERIN (NITROSTAT) 0.4 MG SL tablet Place 1 tablet (0.4 mg total) under the tongue every 5 (five) minutes x 3 doses as needed for chest pain.  Marland Kitchen omeprazole (PRILOSEC) 40 MG capsule Take 40 mg by mouth 2 (two) times daily.      Allergies:   Penicillins   Social History   Tobacco Use  . Smoking status: Current Every Day Smoker    Packs/day: 0.25    Years: 20.00    Pack years: 5.00    Types: Cigars  . Smokeless tobacco: Never Used  . Tobacco comment: 1-2 cig daily  Substance Use Topics  . Alcohol use: No    Alcohol/week: 0.0 standard  drinks  . Drug use: No     Family Hx: The patient's family history includes Other in an other family member.  ROS:   Please see the history of present illness. All other systems reviewed and are negative.   Prior CV studies:   The following studies were reviewed today:  Echocardiogram 05/31/2016: Study Conclusions  - Left ventricle: The cavity size was normal. Wall thickness was increased in a pattern of mild LVH. Systolic function was at the lower limits of normal. The estimated ejection fraction was 50%. Doppler parameters are consistent with abnormal left ventricular relaxation (grade 1 diastolic dysfunction). - Regional wall motion abnormality: Mild hypokinesis of the mid inferoseptal and basal-mid inferior myocardium.  Labs/Other Tests and Data Reviewed:    EKG:  An ECG  dated 11/19/2017 was personally reviewed today and demonstrated:  Sinus rhythm with nonspecific ST-T changes.  Recent Labs: 11/05/2018: Hemoglobin 12.8; Platelets 136 12/05/2018: BUN 23; Creatinine, Ser 1.34; Potassium 4.0; Sodium 137  May 2020: Hemoglobin 13.1, platelets 141, hemoglobin A1c 6.2%  Wt Readings from Last 3 Encounters:  12/05/18 138 lb (62.6 kg)  03/21/18 142 lb 12.8 oz (64.8 kg)  11/19/17 135 lb (61.2 kg)     Objective:    Vital Signs:  BP 123/69   Pulse 84   Ht 5\' 1"  (1.549 m)   BMI 26.07 kg/m    Patient spoke with clear voice, short sentences.  Not obviously short of breath.  No audible wheezing or coughing.  ASSESSMENT & PLAN:    1.  CAD with history of DES to the RCA in 2015.  We are managing him medically, current regimen is well-rounded as detailed above, and he does not report any angina symptoms or nitroglycerin use.  Requesting interval lab work from PCP for review.  2.  Mixed hyperlipidemia, he continues on Lipitor.  Requesting interval lab work from PCP for review.  3.  Essential hypertension, systolic in the 937D today.  No changes to current regimen.    COVID-19 Education: The signs and symptoms of COVID-19 were discussed with the patient and how to seek care for testing (follow up with PCP or arrange E-visit).  The importance of social distancing was discussed today.  Time:   Today, I have spent 6 minutes with the patient with telehealth technology discussing the above problems.     Medication Adjustments/Labs and Tests Ordered: Current medicines are reviewed at length with the patient today.  Concerns regarding medicines are outlined above.   Tests Ordered: No orders of the defined types were placed in this encounter.   Medication Changes: No orders of the defined types were placed in this encounter.   Follow Up:  Either In Person or Virtual 1 year.  Signed, Rozann Lesches, MD  01/05/2019 9:55 AM    [place]

## 2019-01-05 ENCOUNTER — Telehealth (INDEPENDENT_AMBULATORY_CARE_PROVIDER_SITE_OTHER): Payer: Medicare Other | Admitting: Cardiology

## 2019-01-05 ENCOUNTER — Encounter: Payer: Self-pay | Admitting: Cardiology

## 2019-01-05 ENCOUNTER — Other Ambulatory Visit (HOSPITAL_COMMUNITY): Payer: Self-pay | Admitting: Psychiatry

## 2019-01-05 ENCOUNTER — Encounter: Payer: Self-pay | Admitting: *Deleted

## 2019-01-05 VITALS — BP 123/69 | HR 84 | Ht 61.0 in

## 2019-01-05 DIAGNOSIS — E782 Mixed hyperlipidemia: Secondary | ICD-10-CM

## 2019-01-05 DIAGNOSIS — I25119 Atherosclerotic heart disease of native coronary artery with unspecified angina pectoris: Secondary | ICD-10-CM | POA: Diagnosis not present

## 2019-01-05 DIAGNOSIS — I10 Essential (primary) hypertension: Secondary | ICD-10-CM

## 2019-01-05 MED ORDER — CLOZAPINE 100 MG PO TABS: ORAL_TABLET | ORAL | 0 refills | Status: DC

## 2019-01-05 NOTE — Patient Instructions (Signed)

## 2019-01-06 ENCOUNTER — Telehealth (HOSPITAL_COMMUNITY): Payer: Self-pay | Admitting: *Deleted

## 2019-01-06 NOTE — Telephone Encounter (Signed)
SPOKE WITH [name] GRAVES # [phone] GROUP HOME OWNER TO F/U  PER PROVIDER: Do you mean that this provider will order clozapine? If not, he will need monthly blood test to continue medication as we discussed before. It is past due already. Please advise them to get it ASAP to continue clozapine.   MR LONNIE CONFIRMED THE PROVIDER WILL MANAGE CARE OF LABS & ORDERING [facility] MED'S.  DID REMIND PER PROVIDER: labs are past due already. Please advise them to get it ASAP to continue clozapine.  HE AGREED  & WILL BE DONE THIS WEEK

## 2019-01-06 NOTE — Telephone Encounter (Signed)
Per provider request: could you check in the facility if they did have blood draw this month for cloazapine? Please advise them to do so ASAP as it is past due. Spoke with Group Home Owner & he informed that they have a Provider that will be coming out to make home home visits & will begin to mange care. And that he had planned on mailing out a letter to inform us.  I requested letter still be sent to [place] to make part of permanent records

## 2019-01-06 NOTE — Telephone Encounter (Signed)
Do you mean that this provider will order clozapine? If not, he will need monthly blood test to continue medication as we discussed before. It is past due already. Please advise them to get it ASAP to continue clozapine.

## 2019-01-06 NOTE — Telephone Encounter (Signed)
GROUP HOME IS AWARE LABS ARE PAST DUE. & STATED IT WILL BE DONE THIS WEEK. PER NEW PROVIDER SCHEDULE COMING TO THE HOME

## 2019-01-09 NOTE — Telephone Encounter (Signed)
SPOKE WITH [name] GRAVES # [phone] GROUP HOME OWNER TO F/U  PER PROVIDER Made sure the PCP will now order the Clozepam for the [facility]S Program (PCP will now order the medication) . And that they know to fax labs monthly. And that the lab for blood work has been done or done by today (labs drawn)

## 2019-01-15 ENCOUNTER — Other Ambulatory Visit: Payer: Self-pay | Admitting: Cardiology

## 2019-03-12 ENCOUNTER — Ambulatory Visit (HOSPITAL_COMMUNITY): Payer: Medicare Other | Admitting: Psychiatry

## 2019-03-26 ENCOUNTER — Encounter: Payer: Self-pay | Admitting: Psychiatry

## 2019-03-26 ENCOUNTER — Ambulatory Visit (INDEPENDENT_AMBULATORY_CARE_PROVIDER_SITE_OTHER): Payer: Medicare Other | Admitting: Psychiatry

## 2019-03-26 ENCOUNTER — Other Ambulatory Visit: Payer: Self-pay

## 2019-03-26 DIAGNOSIS — F209 Schizophrenia, unspecified: Secondary | ICD-10-CM

## 2019-03-26 MED ORDER — CLOZAPINE 100 MG PO TABS: ORAL_TABLET | ORAL | 0 refills | Status: AC

## 2019-03-26 MED ORDER — CLONAZEPAM 0.5 MG PO TABS: 0.5000 mg | ORAL_TABLET | Freq: Every day | ORAL | 2 refills | Status: DC

## 2019-03-26 MED ORDER — ARIPIPRAZOLE 15 MG PO TABS: 15.0000 mg | ORAL_TABLET | Freq: Every day | ORAL | 1 refills | Status: DC

## 2019-03-26 MED ORDER — DULOXETINE HCL 60 MG PO CPEP: 120.0000 mg | ORAL_CAPSULE | Freq: Every day | ORAL | 1 refills | Status: DC

## 2019-03-26 NOTE — Progress Notes (Signed)
BH MD OP Progress Note  Virtual Visit via Telephone Note  I connected with Micai Apolinar on 03/26/19 at  1:00 PM EST by telephone and verified that I am speaking with the correct person using two identifiers.  I discussed the limitations, risks, security and privacy concerns of performing an evaluation and management service by telephone and the availability of in person appointments. I also discussed with the patient that there may be a patient responsible charge related to this service. The patient expressed understanding and agreed to proceed.   03/26/2019 1:19 PM Zeno Hickel  MRN:  [id number]  Chief Complaint: " I am fine."  HPI: This is a 59 year old male with history of schizophrenia and other medical comorbidities.  He resides in a residential facility.  He reported he has been doing well.  He stated that he is no longer having any auditory or visual hallucinations.  He denied any paranoid ideations.  He did report having some difficulty with sleep occasionally.  He stated that everyone at the residential facility are nice to him and denied any acute issues or concerns at this time. I verified with the residential facility staff regarding his CBC.  They informed that he has not had any blood work done since November.  They confirmed his medication list for me. I contacted the pharmacy Rx care in [place].  The pharmacist verified that they did fill his prescription for clozapine 100 mg every morning and 500 mg at bedtime on January 13 as currently REMS program is allowing the patient's medication clozapine to be filled without there blood work.  His last CBC blood work submitted to REMS program was back in October.  Visit Diagnosis:    ICD-10-CM   1. Schizophrenia, unspecified type (HCC)  F20.9     Past Psychiatric History: Schizophrenia  Past Medical History:  Past Medical History:  Diagnosis Date  . CAD (coronary artery disease)    a. 10/2013 Lateral STEMI/PCI: LM nl, LAD  min irregs, D1 occluded sub branch, LCX min irregs mid, OM large/patent, RCA 123m (2.75x28 Promus DES).  . Cholelithiasis   . Essential hypertension   . History of renal failure    a. 06/2007: in setting of rhabdo post L AKA.  Marland Kitchen Hyperlipidemia   . MI (myocardial infarction) (HCC)   . Nephrolithiasis   . Peripheral vascular disease (HCC)    a. 06/2007: s/p L AKA @ [facility]  . Schizophrenia (HCC)   . Type 2 diabetes mellitus (HCC)     Past Surgical History:  Procedure Laterality Date  . Above-the-knee amputation Left   . COLONOSCOPY WITH PROPOFOL N/A 12/09/2018   Procedure: COLONOSCOPY WITH PROPOFOL;  Surgeon: West Bali, MD;  Location[facility];  Service: Endoscopy;  Laterality: N/A;  2:15pm  . LEFT HEART CATHETERIZATION WITH CORONARY ANGIOGRAM N/A 11/20/2013   Procedure: LEFT HEART CATHETERIZATION WITH CORONARY ANGIOGRAM;  Surgeon: Corky Crafts, MD;  Location: [facility] CATH LAB;  Service: Cardiovascular;  Laterality: N/A;    Family Psychiatric History: denied  Family History:  Family History  Problem Relation Age of Onset  . Other Other        no premature CAD.    Social History:  Social History   Socioeconomic History  . Marital status: Single    Spouse name: Not on file  . Number of children: Not on file  . Years of education: Not on file  . Highest education level: Not on file  Occupational History  . Not on  file  Tobacco Use  . Smoking status: Current Every Day Smoker    Packs/day: 0.25    Years: 20.00    Pack years: 5.00    Types: Cigars  . Smokeless tobacco: Never Used  . Tobacco comment: 1-2 cig daily  Substance and Sexual Activity  . Alcohol use: No    Alcohol/week: 0.0 standard drinks  . Drug use: No  . Sexual activity: Never  Other Topics Concern  . Not on file  Social History Narrative   Single, no children   Social Determinants of Health   Financial Resource Strain:   . Difficulty of Paying Living Expenses: Not on file  Food Insecurity:    . Worried About Programme researcher, broadcasting/film/video in the Last Year: Not on file  . Ran Out of Food in the Last Year: Not on file  Transportation Needs:   . Lack of Transportation (Medical): Not on file  . Lack of Transportation (Non-Medical): Not on file  Physical Activity:   . Days of Exercise per Week: Not on file  . Minutes of Exercise per Session: Not on file  Stress:   . Feeling of Stress : Not on file  Social Connections:   . Frequency of Communication with Friends and Family: Not on file  . Frequency of Social Gatherings with Friends and Family: Not on file  . Attends Religious Services: Not on file  . Active Member of Clubs or Organizations: Not on file  . Attends Banker Meetings: Not on file  . Marital Status: Not on file    Allergies:  Allergies  Allergen Reactions  . Penicillins Other (See Comments)    Reaction unknown    Metabolic Disorder Labs: Lab Results  Component Value Date   HGBA1C 5.8 (H) 02/23/2014   MPG 120 (H) 02/23/2014   No results found for: PROLACTIN Lab Results  Component Value Date   CHOL 102 11/21/2013   TRIG 66 11/21/2013   HDL 27 (L) 11/21/2013   CHOLHDL 3.8 11/21/2013   VLDL 13 11/21/2013   LDLCALC 62 11/21/2013   Lab Results  Component Value Date   TSH 1.490 11/21/2013    Therapeutic Level Labs: No results found for: LITHIUM No results found for: VALPROATE No components found for:  CBMZ  Current Medications: Current Outpatient Medications  Medication Sig Dispense Refill  . acetaminophen (TYLENOL) 500 MG tablet Take 500 mg by mouth every 4 (four) hours as needed for moderate pain or headache.    . ARIPiprazole (ABILIFY) 15 MG tablet Take 1 tablet (15 mg total) by mouth daily. 90 tablet 1  . aspirin EC 81 MG tablet Take 81 mg by mouth daily.    Marland Kitchen atorvastatin (LIPITOR) 80 MG tablet Take 1 tablet (80 mg total) by mouth daily at 6 PM. 30 tablet 11  . Cholecalciferol (VITAMIN D) 50 MCG (2000 UT) CAPS Take 2,000 Units by mouth  every Wednesday.     . clonazePAM (KLONOPIN) 0.5 MG tablet Take 1 tablet (0.5 mg total) by mouth at bedtime. 30 tablet 3  . clopidogrel (PLAVIX) 75 MG tablet TAKE (1) TABLET BY MOUTH ONCE DAILY. 30 tablet 6  . cloZAPine (CLOZARIL) 100 MG tablet 100 mg in AM and 500 mg at night 180 tablet 0  . docusate sodium (COLACE) 100 MG capsule Take 100 mg by mouth 2 (two) times daily.     . DULoxetine (CYMBALTA) 60 MG capsule Take 2 capsules (120 mg total) by mouth daily. 180 capsule 1  .  famotidine (PEPCID) 20 MG tablet Take 20 mg by mouth daily as needed for heartburn or indigestion.    . gabapentin (NEURONTIN) 100 MG capsule Take 100 mg by mouth 2 (two) times daily.    . hydrOXYzine (ATARAX/VISTARIL) 25 MG tablet Take 25 mg by mouth 2 (two) times daily as needed for anxiety or itching.    . insulin aspart (NOVOLOG) 100 UNIT/ML injection Inject 2 Units into the skin 3 (three) times daily before meals. (Patient taking differently: Inject 2 Units into the skin 3 (three) times daily as needed (if BS > 200 and resident eats a meal. Hold if resident does not eat.). ) 10 mL 11  . insulin detemir (LEVEMIR) 100 UNIT/ML injection Inject 0.3 mLs (30 Units total) into the skin daily. (Patient taking differently: Inject 35 Units into the skin every evening. Hold if pt does not eat .) 10 mL 11  . ketoconazole (NIZORAL) 2 % shampoo Apply 1 application topically 2 (two) times a week.    . latanoprost (XALATAN) 0.005 % ophthalmic solution Place 1 drop into both eyes at bedtime.    Marland Kitchen lisinopril (ZESTRIL) 10 MG tablet Take 10 mg by mouth daily.     . metoprolol (LOPRESSOR) 50 MG tablet Take 1 tablet (50 mg total) by mouth 2 (two) times daily. 180 tablet 3  . mupirocin ointment (BACTROBAN) 2 % Place 1 application into the nose 2 (two) times daily.    . nitroGLYCERIN (NITROSTAT) 0.4 MG SL tablet Place 1 tablet (0.4 mg total) under the tongue every 5 (five) minutes x 3 doses as needed for chest pain. 25 tablet 12  . omeprazole  (PRILOSEC) 40 MG capsule Take 40 mg by mouth 2 (two) times daily.      No current facility-administered medications for this visit.     Psychiatric Specialty Exam: Review of Systems  There were no vitals taken for this visit.There is no height or weight on file to calculate BMI.  General Appearance: unable to assess due to phone visit  Eye Contact:  unable to assess due to phone visit  Speech:  Normal Rate  Volume:  Normal  Mood:  Euthymic  Affect:  Congruent  Thought Process:  Goal Directed and Descriptions of Associations: Intact  Orientation:  Full (Time, Place, and Person)  Thought Content: Logical   Suicidal Thoughts:  No  Homicidal Thoughts:  No  Memory:  Immediate;   Good Recent;   Good  Judgement:  Fair  Insight:  Fair  Psychomotor Activity:  Normal  Concentration:  Concentration: Good and Attention Span: Fair  Recall:  Good  Fund of Knowledge: Good  Language: Good  Akathisia:  Negative  Handed:  Right  AIMS (if indicated):  Not done due to phone visit  Assets:  Communication Skills Desire for Improvement Financial Resources/Insurance Housing  ADL's:  Intact  Cognition: WNL  Sleep:  Fair    Assessment and Plan: 59 year old male with history of schizophrenia and other medical issues now residing in a residential facility being managed on clozapine and other medications.  His dose of clozapine has been filled by his pharmacy due to REMS program allowing it without CBC monthly draw due to the ongoing pandemic.  His last CBC was drawn back in October.    1. Schizophrenia, unspecified type ([facility]) -Continue clozapine 100 mg every morning, 500 mg at bedtime. -Continue Abilify 15 mg daily. -Continue clonazepam 0.5 mg at bedtime -Continue duloxetine 120 mg daily.  The staff at the facility were  encouraged to get his blood work done as soon as possible.   Follow-up in 3 months.   Zena Amos, MD 03/26/2019, 1:19 PM

## 2019-04-27 ENCOUNTER — Telehealth: Payer: Self-pay | Admitting: Gastroenterology

## 2019-04-27 NOTE — Telephone Encounter (Signed)
This was the recommendations for SF procedure note on 09/23/2018   - Repeat colonoscopy at next available appointment (within 3 months) for surveillance.

## 2019-04-28 ENCOUNTER — Encounter: Payer: Self-pay | Admitting: Gastroenterology

## 2019-04-28 NOTE — Telephone Encounter (Signed)
Pt needs OV to schedule TCS. He was last seen 02/2018 and had poor prep.

## 2019-04-29 NOTE — Telephone Encounter (Signed)
OV made °

## 2019-06-16 ENCOUNTER — Encounter: Payer: Self-pay | Admitting: *Deleted

## 2019-06-16 ENCOUNTER — Encounter: Payer: Self-pay | Admitting: Gastroenterology

## 2019-06-16 ENCOUNTER — Other Ambulatory Visit: Payer: Self-pay

## 2019-06-16 ENCOUNTER — Ambulatory Visit (INDEPENDENT_AMBULATORY_CARE_PROVIDER_SITE_OTHER): Payer: Medicare Other | Admitting: Gastroenterology

## 2019-06-16 VITALS — BP 124/81 | HR 87 | Temp 96.2°F | Ht 61.0 in | Wt 143.0 lb

## 2019-06-16 DIAGNOSIS — Z1211 Encounter for screening for malignant neoplasm of colon: Secondary | ICD-10-CM

## 2019-06-16 DIAGNOSIS — K5900 Constipation, unspecified: Secondary | ICD-10-CM | POA: Insufficient documentation

## 2019-06-16 MED ORDER — TRULANCE 3 MG PO TABS: 3.0000 mg | ORAL_TABLET | Freq: Every day | ORAL | 3 refills | Status: DC

## 2019-06-16 MED ORDER — PEG 3350-KCL-NA BICARB-NACL 420 G PO SOLR: ORAL | 0 refills | Status: DC

## 2019-06-16 NOTE — Progress Notes (Signed)
Primary Care Physician:  Anselm Jungling, NP  Primary Gastroenterologist:  [name], MD   Chief Complaint  Patient presents with  . Colonoscopy    had poor prep    HPI:  Oscar Elliott is a 59 y.o. male here to schedule colonoscopy. Attempted colonoscopy in 11/2018 incomplete due to poor bowel prep. Presents with staff from group home. Staff states that patient complete prescribed bowel prep and followed diet instructions. At baseline he has some constipation and is on stool softener daily. Patient is a poor historian. History of unreliable per staff. There has been no noted abdominal pain, vomiting, melena, brbpr, weight loss. Patient has been eating well.   Current Outpatient Medications  Medication Sig Dispense Refill  . ARIPiprazole (ABILIFY) 15 MG tablet Take 1 tablet (15 mg total) by mouth daily. 90 tablet 1  . aspirin EC 81 MG tablet Take 81 mg by mouth daily.    Marland Kitchen atorvastatin (LIPITOR) 80 MG tablet Take 1 tablet (80 mg total) by mouth daily at 6 PM. 30 tablet 11  . clonazePAM (KLONOPIN) 0.5 MG tablet Take 1 tablet (0.5 mg total) by mouth at bedtime. 30 tablet 2  . clopidogrel (PLAVIX) 75 MG tablet TAKE (1) TABLET BY MOUTH ONCE DAILY. 30 tablet 6  . cloZAPine (CLOZARIL) 100 MG tablet 100 mg in AM and 500 mg at night 180 tablet 0  . docusate sodium (COLACE) 100 MG capsule Take 100 mg by mouth 2 (two) times daily.     . DULoxetine (CYMBALTA) 60 MG capsule Take 2 capsules (120 mg total) by mouth daily. 180 capsule 1  . famotidine (PEPCID) 20 MG tablet Take 20 mg by mouth daily as needed for heartburn or indigestion.    . gabapentin (NEURONTIN) 100 MG capsule Take 100 mg by mouth 2 (two) times daily.    . hydrOXYzine (ATARAX/VISTARIL) 25 MG tablet Take 25 mg by mouth 2 (two) times daily as needed for anxiety or itching.    . insulin aspart (NOVOLOG) 100 UNIT/ML injection Inject 2 Units into the skin 3 (three) times daily before meals. (Patient taking differently: Inject 2 Units into  the skin 3 (three) times daily as needed (if BS > 200 and resident eats a meal. Hold if resident does not eat.). ) 10 mL 11  . insulin detemir (LEVEMIR) 100 UNIT/ML injection Inject 0.3 mLs (30 Units total) into the skin daily. (Patient taking differently: Inject 35 Units into the skin every evening. Hold if pt does not eat .) 10 mL 11  . ketoconazole (NIZORAL) 2 % shampoo Apply 1 application topically 2 (two) times a week.    . latanoprost (XALATAN) 0.005 % ophthalmic solution Place 1 drop into both eyes at bedtime.    Marland Kitchen lisinopril (ZESTRIL) 10 MG tablet Take 10 mg by mouth daily.     . nitroGLYCERIN (NITROSTAT) 0.4 MG SL tablet Place 1 tablet (0.4 mg total) under the tongue every 5 (five) minutes x 3 doses as needed for chest pain. 25 tablet 12  . omeprazole (PRILOSEC) 40 MG capsule Take 40 mg by mouth 2 (two) times daily.     . polyethylene glycol powder (CLEARLAX) 17 GM/SCOOP powder Take 1 Container by mouth as needed.    . tamsulosin (FLOMAX) 0.4 MG CAPS capsule Take 0.4 mg by mouth daily.    Marland Kitchen acetaminophen (TYLENOL) 500 MG tablet Take 500 mg by mouth every 4 (four) hours as needed for moderate pain or headache.    . Cholecalciferol (VITAMIN D) 50  MCG (2000 UT) CAPS Take 2,000 Units by mouth every Wednesday.     . metoprolol (LOPRESSOR) 50 MG tablet Take 1 tablet (50 mg total) by mouth 2 (two) times daily. (Patient not taking: Reported on 06/16/2019) 180 tablet 3  . mupirocin ointment (BACTROBAN) 2 % Place 1 application into the nose 2 (two) times daily.     No current facility-administered medications for this visit.    Allergies as of 06/16/2019 - Review Complete 06/16/2019  Allergen Reaction Noted  . Penicillins Other (See Comments) 11/20/2013    Past Medical History:  Diagnosis Date  . CAD (coronary artery disease)    a. 10/2013 Lateral STEMI/PCI: LM nl, LAD min irregs, D1 occluded sub branch, LCX min irregs mid, OM large/patent, RCA 173m (2.75x28 Promus DES).  . Cholelithiasis   .  Essential hypertension   . History of renal failure    a. 06/2007: in setting of rhabdo post L AKA.  Marland Kitchen Hyperlipidemia   . MI (myocardial infarction) ([place])   . Nephrolithiasis   . Peripheral vascular disease ([place])    a. 06/2007: s/p L AKA @ [facility]  . Schizophrenia ([place])   . Type 2 diabetes mellitus ([place])     Past Surgical History:  Procedure Laterality Date  . Above-the-knee amputation Left   . COLONOSCOPY WITH PROPOFOL N/A 12/09/2018   fields: nonthrombosed hemorrhoids, decreased anal sphincter tone. poor prep, colonoscopy aborted  . LEFT HEART CATHETERIZATION WITH CORONARY ANGIOGRAM N/A 11/20/2013   Procedure: LEFT HEART CATHETERIZATION WITH CORONARY ANGIOGRAM;  Surgeon: Jettie Booze, MD;  Location: [facility] CATH LAB;  Service: Cardiovascular;  Laterality: N/A;    Family History  Problem Relation Age of Onset  . Other Other        no premature CAD.    Social History   Socioeconomic History  . Marital status: Single    Spouse name: Not on file  . Number of children: Not on file  . Years of education: Not on file  . Highest education level: Not on file  Occupational History  . Not on file  Tobacco Use  . Smoking status: Current Every Day Smoker    Packs/day: 0.25    Years: 20.00    Pack years: 5.00    Types: Cigars  . Smokeless tobacco: Never Used  . Tobacco comment: 1-2 cig daily  Substance and Sexual Activity  . Alcohol use: No    Alcohol/week: 0.0 standard drinks  . Drug use: No  . Sexual activity: Never  Other Topics Concern  . Not on file  Social History Narrative   Single, no children   Social Determinants of Health   Financial Resource Strain:   . Difficulty of Paying Living Expenses:   Food Insecurity:   . Worried About Charity fundraiser in the Last Year:   . Arboriculturist in the Last Year:   Transportation Needs:   . Film/video editor (Medical):   Marland Kitchen Lack of Transportation (Non-Medical):   Physical Activity:   . Days of Exercise per Week:    . Minutes of Exercise per Session:   Stress:   . Feeling of Stress :   Social Connections:   . Frequency of Communication with Friends and Family:   . Frequency of Social Gatherings with Friends and Family:   . Attends Religious Services:   . Active Member of Clubs or Organizations:   . Attends Archivist Meetings:   Marland Kitchen Marital Status:   Intimate Production manager  Violence:   . Fear of Current or Ex-Partner:   . Emotionally Abused:   Marland Kitchen Physically Abused:   . Sexually Abused:       ROS: unreliable  General: Negative for anorexia, weight loss, fever, chills, fatigue, weakness. Eyes: Negative for vision changes.  ENT: Negative for hoarseness, difficulty swallowing , nasal congestion. CV: Negative for chest pain, angina, palpitations, dyspnea on exertion, peripheral edema.  Respiratory: Negative for dyspnea at rest, dyspnea on exertion, cough, sputum, wheezing.  GI: See history of present illness. GU:  Negative for dysuria, hematuria, urinary incontinence, urinary frequency, nocturnal urination.  MS: Negative for joint pain, low back pain.  Derm: Negative for rash or itching.  Neuro: Negative for weakness, abnormal sensation, seizure, frequent headaches, memory loss, confusion.  Psych: Negative for anxiety, depression, suicidal ideation, hallucinations.  Endo: Negative for unusual weight change.  Heme: Negative for bruising or bleeding. Allergy: Negative for rash or hives.    Physical Examination:  BP 124/81   Pulse 87   Temp (!) 96.2 F (35.7 C) (Temporal)   Ht 5\' 1"  (1.549 m)   Wt 143 lb (64.9 kg)   BMI 27.02 kg/m    General: Well-nourished, well-developed in no acute distress.  Head: Normocephalic, atraumatic.   Eyes: Conjunctiva pink, no icterus. Mouth: masked. Neck: Supple without thyromegaly, masses, or lymphadenopathy.  Lungs: Clear to auscultation bilaterally.  Heart: Regular rate and rhythm, no murmurs rubs or gallops.  Abdomen: Bowel sounds are normal,  nontender, nondistended, no hepatosplenomegaly or masses, no abdominal bruits or    hernia , no rebound or guarding.   Rectal: not performed Extremities: No lower extremity edema. No clubbing or deformities.  Neuro: Alert and oriented x 4 , grossly normal neurologically.  Skin: Warm and dry, no rash or jaundice.   Psych: Alert and cooperative, normal mood and affect.    Imaging Studies: No results found.

## 2019-06-16 NOTE — Patient Instructions (Signed)
1. Stop docusate sodium. 2. Start Trulance 3mg  daily. RX sent to . Call if patient has frequent diarrhea.  3. Colonoscopy as scheduled. See separate instructions.

## 2019-06-17 ENCOUNTER — Telehealth (HOSPITAL_COMMUNITY): Payer: Self-pay | Admitting: Psychiatry

## 2019-06-17 ENCOUNTER — Encounter: Payer: Self-pay | Admitting: Gastroenterology

## 2019-06-17 ENCOUNTER — Encounter: Payer: Self-pay | Admitting: *Deleted

## 2019-06-17 NOTE — Progress Notes (Deleted)
[name] MD/PA/NP OP Progress Note  06/17/2019 1:03 PM Oscar Elliott  MRN:  [id number]  Chief Complaint:  HPI: *** Visit Diagnosis: No diagnosis found. .  Past Psychiatric History: Please see initial evaluation for full details. I have reviewed the history. No updates at this time.     Past Medical History:  Past Medical History:  Diagnosis Date  . CAD (coronary artery disease)    a. 10/2013 Lateral STEMI/PCI: LM nl, LAD min irregs, D1 occluded sub branch, LCX min irregs mid, OM large/patent, RCA 16m (2.75x28 Promus DES).  . Cholelithiasis   . Essential hypertension   . History of renal failure    a. 06/2007: in setting of rhabdo post L AKA.  Marland Kitchen Hyperlipidemia   . MI (myocardial infarction) ([place])   . Nephrolithiasis   . Peripheral vascular disease ([place])    a. 06/2007: s/p L AKA @ [facility]  . Schizophrenia ([place])   . Type 2 diabetes mellitus ([place])     Past Surgical History:  Procedure Laterality Date  . Above-the-knee amputation Left   . COLONOSCOPY WITH PROPOFOL N/A 12/09/2018   fields: nonthrombosed hemorrhoids, decreased anal sphincter tone. poor prep, colonoscopy aborted  . LEFT HEART CATHETERIZATION WITH CORONARY ANGIOGRAM N/A 11/20/2013   Procedure: LEFT HEART CATHETERIZATION WITH CORONARY ANGIOGRAM;  Surgeon: Jettie Booze, MD;  Location: [facility] CATH LAB;  Service: Cardiovascular;  Laterality: N/A;    Family Psychiatric History: Please see initial evaluation for full details. I have reviewed the history. No updates at this time.     Family History:  Family History  Problem Relation Age of Onset  . Other Other        no premature CAD.    Social History:  Social History   Socioeconomic History  . Marital status: Single    Spouse name: Not on file  . Number of children: Not on file  . Years of education: Not on file  . Highest education level: Not on file  Occupational History  . Not on file  Tobacco Use  . Smoking status: Current Every Day Smoker    Packs/day:  0.25    Years: 20.00    Pack years: 5.00    Types: Cigars  . Smokeless tobacco: Never Used  . Tobacco comment: 1-2 cig daily  Substance and Sexual Activity  . Alcohol use: No    Alcohol/week: 0.0 standard drinks  . Drug use: No  . Sexual activity: Never  Other Topics Concern  . Not on file  Social History Narrative   Single, no children   Social Determinants of Health   Financial Resource Strain:   . Difficulty of Paying Living Expenses:   Food Insecurity:   . Worried About Charity fundraiser in the Last Year:   . Arboriculturist in the Last Year:   Transportation Needs:   . Film/video editor (Medical):   Marland Kitchen Lack of Transportation (Non-Medical):   Physical Activity:   . Days of Exercise per Week:   . Minutes of Exercise per Session:   Stress:   . Feeling of Stress :   Social Connections:   . Frequency of Communication with Friends and Family:   . Frequency of Social Gatherings with Friends and Family:   . Attends Religious Services:   . Active Member of Clubs or Organizations:   . Attends Archivist Meetings:   Marland Kitchen Marital Status:     Allergies:  Allergies  Allergen Reactions  . Penicillins Other (  See Comments)    Reaction unknown    Metabolic Disorder Labs: Lab Results  Component Value Date   HGBA1C 5.8 (H) 02/23/2014   MPG 120 (H) 02/23/2014   No results found for: PROLACTIN Lab Results  Component Value Date   CHOL 102 11/21/2013   TRIG 66 11/21/2013   HDL 27 (L) 11/21/2013   CHOLHDL 3.8 11/21/2013   VLDL 13 11/21/2013   LDLCALC 62 11/21/2013   Lab Results  Component Value Date   TSH 1.490 11/21/2013    Therapeutic Level Labs: No results found for: LITHIUM No results found for: VALPROATE No components found for:  CBMZ  Current Medications: Current Outpatient Medications  Medication Sig Dispense Refill  . acetaminophen (TYLENOL) 500 MG tablet Take 500 mg by mouth every 4 (four) hours as needed for moderate pain or headache.     . ARIPiprazole (ABILIFY) 15 MG tablet Take 1 tablet (15 mg total) by mouth daily. 90 tablet 1  . aspirin EC 81 MG tablet Take 81 mg by mouth daily.    Marland Kitchen atorvastatin (LIPITOR) 80 MG tablet Take 1 tablet (80 mg total) by mouth daily at 6 PM. 30 tablet 11  . Cholecalciferol (VITAMIN D) 50 MCG (2000 UT) CAPS Take 2,000 Units by mouth every Wednesday.     . clonazePAM (KLONOPIN) 0.5 MG tablet Take 1 tablet (0.5 mg total) by mouth at bedtime. 30 tablet 2  . clopidogrel (PLAVIX) 75 MG tablet TAKE (1) TABLET BY MOUTH ONCE DAILY. 30 tablet 6  . cloZAPine (CLOZARIL) 100 MG tablet 100 mg in AM and 500 mg at night 180 tablet 0  . DULoxetine (CYMBALTA) 60 MG capsule Take 2 capsules (120 mg total) by mouth daily. 180 capsule 1  . famotidine (PEPCID) 20 MG tablet Take 20 mg by mouth daily as needed for heartburn or indigestion.    . gabapentin (NEURONTIN) 100 MG capsule Take 100 mg by mouth 2 (two) times daily.    . hydrOXYzine (ATARAX/VISTARIL) 25 MG tablet Take 25 mg by mouth 2 (two) times daily as needed for anxiety or itching.    . insulin aspart (NOVOLOG) 100 UNIT/ML injection Inject 2 Units into the skin 3 (three) times daily before meals. (Patient taking differently: Inject 2 Units into the skin 3 (three) times daily as needed (if BS > 200 and resident eats a meal. Hold if resident does not eat.). ) 10 mL 11  . insulin detemir (LEVEMIR) 100 UNIT/ML injection Inject 0.3 mLs (30 Units total) into the skin daily. (Patient taking differently: Inject 35 Units into the skin every evening. Hold if pt does not eat .) 10 mL 11  . ketoconazole (NIZORAL) 2 % shampoo Apply 1 application topically 2 (two) times a week.    . latanoprost (XALATAN) 0.005 % ophthalmic solution Place 1 drop into both eyes at bedtime.    Marland Kitchen lisinopril (ZESTRIL) 10 MG tablet Take 10 mg by mouth daily.     . metoprolol (LOPRESSOR) 50 MG tablet Take 1 tablet (50 mg total) by mouth 2 (two) times daily. (Patient not taking: Reported on 06/16/2019)  180 tablet 3  . mupirocin ointment (BACTROBAN) 2 % Place 1 application into the nose 2 (two) times daily.    . nitroGLYCERIN (NITROSTAT) 0.4 MG SL tablet Place 1 tablet (0.4 mg total) under the tongue every 5 (five) minutes x 3 doses as needed for chest pain. 25 tablet 12  . omeprazole (PRILOSEC) 40 MG capsule Take 40 mg by mouth 2 (two)  times daily.     Marland Kitchen Plecanatide (TRULANCE) 3 MG TABS Take 3 mg by mouth daily. 30 tablet 3  . polyethylene glycol powder (CLEARLAX) 17 GM/SCOOP powder Take 1 Container by mouth as needed.    . polyethylene glycol-electrolytes (NULYTELY) 420 g solution As directed 4000 mL 0  . tamsulosin (FLOMAX) 0.4 MG CAPS capsule Take 0.4 mg by mouth daily.     No current facility-administered medications for this visit.     Musculoskeletal: Strength & Muscle Tone: N/A Gait & Station: N/A Patient leans: N/A  Psychiatric Specialty Exam: Review of Systems  There were no vitals taken for this visit.There is no height or weight on file to calculate BMI.  General Appearance: {Appearance:22683}  Eye Contact:  {BHH EYE CONTACT:22684}  Speech:  Clear and Coherent  Volume:  Normal  Mood:  {BHH MOOD:22306}  Affect:  {Affect (PAA):22687}  Thought Process:  Coherent  Orientation:  Full (Time, Place, and Person)  Thought Content: {Thought Content:22690}   Suicidal Thoughts:  {ST/HT (PAA):22692}  Homicidal Thoughts:  {ST/HT (PAA):22692}  Memory:  {BHH MEMORY:22881}  Judgement:  {Judgement (PAA):22694}  Insight:  {Insight (PAA):22695}  Psychomotor Activity:  {Psychomotor (PAA):22696}  Concentration:  {Concentration:21399}  Recall:  {BHH GOOD/FAIR/POOR:22877}  Fund of Knowledge: {BHH GOOD/FAIR/POOR:22877}  Language: {BHH GOOD/FAIR/POOR:22877}  Akathisia:  {BHH YES OR NO:22294}  Handed:  {Handed:22697}  AIMS (if indicated): {Desc; done/not:10129}  Assets:  {Assets (PAA):22698}  ADL's:  {BHH ZJI'R:67893}  Cognition: {chl bhh cognition:304700322}  Sleep:  {BHH  GOOD/FAIR/POOR:22877}   Screenings:   Assessment and Plan:  Oscar Elliott is a 59 y.o. year old male with a history of schizophrenia, chronic renal disease,   CADwith history of DES to the RCA, PAD s/p left AKA, hypertension, hyperlipidemia , who presents for follow up appointment for No diagnosis found.  # Schizophrenia  He continues to demonstrate complete linear thought process, which has been consistent since the initial evaluation.  Staff denies any significant change or behavior issues since the last visit.  We will continue clozapine at the current dose to target schizophrenia.  Discussed risks, which includes but not limited to constipation, metabolic side effect and agranulocytosis.  We will continue to monitor blood test.  We will continue Abilify for schizophrenia.  Although it is preferable to try monotherapy, he will benefit from this combination to avoid relapsing schizophrenia.  Will continue duloxetine for depression.  Will continue clonazepam as needed for anxiety.  Discussed risk of oversedation.   Plan  1.ContinueClozapine 100 mg and 500 at night 2. Continue Abilify 15 mg daily 3. ContinueDuloxetine 120 mg daily 4.ContinueClonazepam 0.5 mg daily 5. Obtain lab (CBC) for monthly monitoring 6.Next appointment:in January - he is advised to get lipid/glucose panels Facility phone: [phone]  The patient demonstrates the following risk factors for suicide: Chronic risk factors for suicide include:psychiatric disorder ofschizophrenia. Acute risk factorsfor suicide include: unemployment. Protective factorsfor this patient include: positive social support. Considering these factors, the overall suicide risk at this point appears to below. Patientisappropriate for outpatient follow up.  Neysa Hotter, MD 06/17/2019, 1:03 PM

## 2019-06-17 NOTE — Assessment & Plan Note (Signed)
Presents to reschedule screening colonoscopy as last attempt was unsuccessful due to poor prep. Staff reports patient followed prep/diet instructions. Patient has some baseline constipation. Takes stool softener daily. Will switch him to Trulance 3mg  daily. RX provided. Will plan for colonoscopy with deep sedation in the near future. Plan for two full days of clear liquids. Additional dulcolax (bisocodyl) 10mg  daily for several days before bowel prep starts.  I have discussed the risks, alternatives, benefits with regards to but not limited to the risk of reaction to medication, bleeding, infection, perforation and the patient is agreeable to proceed. Written consent to be obtained.

## 2019-06-17 NOTE — Telephone Encounter (Signed)
While reviewing his chart, I see that Dr. Evelene Croon ordered clozapine at his last visit. Please contact the group home. I believe his PCP has been ordering his clozapine and monthly blood test. If they would like [place] to do these orders again, please advise to get blood test ASAP if that is not done within a month.

## 2019-06-17 NOTE — Telephone Encounter (Signed)
SPOKE WITH GROUP HOME OWNER WITH PROVIDER'S CONCERN:::  Dr. Evelene Croon ordered clozapine at his last visit. I believe his PCP has been ordering his clozapine and monthly blood test. If they would like [place] to do these orders again, please advise to get blood test ASAP if that is not done within a month.   PER GROUP HOME PATIENT SEES :::  N.P.  TANILLYA PARTRIDGE @ [facility]  & SHE HAS BEEN ORDERING HIS MEDICATION'S & LAB'S

## 2019-06-23 ENCOUNTER — Other Ambulatory Visit: Payer: Self-pay

## 2019-06-23 ENCOUNTER — Telehealth (HOSPITAL_COMMUNITY): Payer: Medicare Other | Admitting: Psychiatry

## 2019-07-08 ENCOUNTER — Other Ambulatory Visit: Payer: Self-pay | Admitting: *Deleted

## 2019-07-08 MED ORDER — CLOPIDOGREL BISULFATE 75 MG PO TABS: 75.0000 mg | ORAL_TABLET | Freq: Every day | ORAL | 2 refills | Status: DC

## 2019-07-15 ENCOUNTER — Telehealth (HOSPITAL_COMMUNITY): Payer: Self-pay | Admitting: *Deleted

## 2019-07-15 NOTE — Telephone Encounter (Signed)
[facility] called requested refill :: cloZAPine (CLOZARIL) 100 MG tablet

## 2019-07-15 NOTE — Telephone Encounter (Signed)
Spoke with [facility] && informed Per Provider  :: I am not prescribing clozapine anymore as we discussed. Please direct them to contact the facility

## 2019-07-15 NOTE — Telephone Encounter (Signed)
I am not prescribing clozapine anymore as we discussed. Please direct them to contact the facility.

## 2019-08-10 ENCOUNTER — Telehealth: Payer: Self-pay | Admitting: Internal Medicine

## 2019-08-10 NOTE — Telephone Encounter (Signed)
Spoke to caregiver, Arlys John, he requested to reschedule procedure. He's aware we will call him to reschedule when RMR's next schedule is available. Endo scheduler informed to cancel TCS.

## 2019-08-10 NOTE — Telephone Encounter (Signed)
Pt needs to reschedule his colonoscopy. [phone]

## 2019-08-12 ENCOUNTER — Encounter (HOSPITAL_COMMUNITY): Admission: RE | Admit: 2019-08-12 | Payer: Medicare Other | Source: Ambulatory Visit

## 2019-08-13 NOTE — Telephone Encounter (Signed)
noted 

## 2019-08-18 ENCOUNTER — Ambulatory Visit (HOSPITAL_COMMUNITY): Admit: 2019-08-18 | Payer: Medicare Other | Admitting: Internal Medicine

## 2019-08-18 ENCOUNTER — Encounter (HOSPITAL_COMMUNITY): Payer: Self-pay

## 2019-08-18 SURGERY — COLONOSCOPY WITH PROPOFOL
Anesthesia: Monitor Anesthesia Care

## 2019-08-20 ENCOUNTER — Other Ambulatory Visit (HOSPITAL_COMMUNITY): Payer: Self-pay | Admitting: Psychiatry

## 2019-08-20 ENCOUNTER — Telehealth: Payer: Self-pay

## 2019-08-20 MED ORDER — DULOXETINE HCL 60 MG PO CPEP: 120.0000 mg | ORAL_CAPSULE | Freq: Every day | ORAL | 0 refills | Status: DC

## 2019-08-20 MED ORDER — CLONAZEPAM 0.5 MG PO TABS: 0.5000 mg | ORAL_TABLET | Freq: Every day | ORAL | 0 refills | Status: DC

## 2019-08-20 NOTE — Telephone Encounter (Signed)
LMOM

## 2019-08-20 NOTE — Telephone Encounter (Signed)
Ordered. Octavia, please contact the facility and make follow up appointment.

## 2019-08-20 NOTE — Telephone Encounter (Signed)
received a messages that patient needs refills on duloxetine and clonazepam

## 2019-09-07 ENCOUNTER — Telehealth: Payer: Self-pay | Admitting: Emergency Medicine

## 2019-09-07 ENCOUNTER — Telehealth: Payer: Self-pay

## 2019-09-07 DIAGNOSIS — K5900 Constipation, unspecified: Secondary | ICD-10-CM

## 2019-09-07 NOTE — Telephone Encounter (Signed)
Verlon Au, please advise ASA for TCS w/Propofol.

## 2019-09-07 NOTE — Telephone Encounter (Signed)
Pt is requesting a 90 day supply of truelance 3mg  please send in to  [facility]

## 2019-09-08 NOTE — Telephone Encounter (Signed)
Called caregiver Arlys John), he wants to wait until September to reschedule TCS. He's aware we will call back when September schedule is available.

## 2019-09-08 NOTE — Telephone Encounter (Signed)
ASA III 

## 2019-09-11 MED ORDER — TRULANCE 3 MG PO TABS: 3.0000 mg | ORAL_TABLET | Freq: Every day | ORAL | 3 refills | Status: DC

## 2019-09-11 NOTE — Addendum Note (Signed)
Addended by: Delane Ginger, Larrissa Stivers A on: 09/11/2019 10:23 AM   Modules accepted: Orders

## 2019-09-11 NOTE — Telephone Encounter (Signed)
Rx sent per patient request  

## 2019-09-16 ENCOUNTER — Other Ambulatory Visit: Payer: Self-pay | Admitting: Cardiology

## 2019-09-17 ENCOUNTER — Telehealth (HOSPITAL_COMMUNITY): Payer: Self-pay | Admitting: Psychiatry

## 2019-09-17 MED ORDER — CLONAZEPAM 0.5 MG PO TABS: 0.5000 mg | ORAL_TABLET | Freq: Every day | ORAL | 0 refills | Status: DC

## 2019-09-17 NOTE — Telephone Encounter (Signed)
Spoke with the administrator for the facility and he informed staff that he will have to get with the pharmacy to have them send this request to the provider that patient is at. Per administrator, patient see another provider for this medication at a facility call [facility] in [place].

## 2019-09-17 NOTE — Telephone Encounter (Signed)
Ordered refill of clonazepam per request from the pharmacy. Please contact the facility to make follow up appointment. I believe we encouraged them to do so last month. Please make them aware that no refill will be ordered without evaluation, and the patient will be discharged from the clinic due to non adherence to treatment plan if he is not seen within a month.

## 2019-09-23 NOTE — Telephone Encounter (Signed)
SPoke with Pharmacist Ed from [facility] and they will send request to [facility] in [place].

## 2019-10-06 ENCOUNTER — Telehealth: Payer: Self-pay | Admitting: *Deleted

## 2019-10-06 ENCOUNTER — Encounter: Payer: Self-pay | Admitting: *Deleted

## 2019-10-06 NOTE — Telephone Encounter (Signed)
Called patient Caregiver Arlys John and patient is scheduled for TCS with propofol with Dr. Marletta Lor on 9/21 at 12:15pm. Aware pt needs to arrive 3 hrs prior for COVID testing day of. He asked I mail this information to the address on file.

## 2019-11-06 NOTE — Patient Instructions (Signed)
Oscar Elliott  11/06/2019     @PREFPERIOPPHARMACY @   Your procedure is scheduled on  11/17/2019.  Report to 11/19/2019 at  0945  A.M.  Call this number if you have problems the morning of surgery:  [phone]   Remember:  Follow the diet and prep instructions given to you by the office.                      Take these medicines the morning of surgery with A SIP OF WATER  Abilify, clonazepine, cymbalta, pepcid, gabapentin, hydroxyzine,(if needed), metoprolol, omeprazole.    Do not wear jewelry, make-up or nail polish.  Do not wear lotions, powders, or perfumes. Please wear deodorant and brush your teeth.  Do not shave 48 hours prior to surgery.  Men may shave face and neck.  Do not bring valuables to the hospital.  [facility] is not responsible for any belongings or valuables.  Contacts, dentures or bridgework may not be worn into surgery.  Leave your suitcase in the car.  After surgery it may be brought to your room.  For patients admitted to the hospital, discharge time will be determined by your treatment team.  Patients discharged the day of surgery will not be allowed to drive home.   Name and phone number of your driver:   family Special instructions:  DO NOT smoke the morning of your procedure.  Please read over the following fact sheets that you were given. Anesthesia Post-op Instructions and Care and Recovery After Surgery       Colonoscopy, Adult, Care After This sheet gives you information about how to care for yourself after your procedure. Your health care provider may also give you more specific instructions. If you have problems or questions, contact your health care provider. What can I expect after the procedure? After the procedure, it is common to have:  A small amount of blood in your stool for 24 hours after the procedure.  Some gas.  Mild cramping or bloating of your abdomen. Follow these instructions at home: Eating and  drinking   Drink enough fluid to keep your urine pale yellow.  Follow instructions from your health care provider about eating or drinking restrictions.  Resume your normal diet as instructed by your health care provider. Avoid heavy or fried foods that are hard to digest. Activity  Rest as told by your health care provider.  Avoid sitting for a long time without moving. Get up to take short walks every 1-2 hours. This is important to improve blood flow and breathing. Ask for help if you feel weak or unsteady.  Return to your normal activities as told by your health care provider. Ask your health care provider what activities are safe for you. Managing cramping and bloating   Try walking around when you have cramps or feel bloated.  Apply heat to your abdomen as told by your health care provider. Use the heat source that your health care provider recommends, such as a moist heat pack or a heating pad. ? Place a towel between your skin and the heat source. ? Leave the heat on for 20-30 minutes. ? Remove the heat if your skin turns bright red. This is especially important if you are unable to feel pain, heat, or cold. You may have a greater risk of getting burned. General instructions  For the first 24 hours after the procedure: ? Do not drive or use  machinery. ? Do not sign important documents. ? Do not drink alcohol. ? Do your regular daily activities at a slower pace than normal. ? Eat soft foods that are easy to digest.  Take over-the-counter and prescription medicines only as told by your health care provider.  Keep all follow-up visits as told by your health care provider. This is important. Contact a health care provider if:  You have blood in your stool 2-3 days after the procedure. Get help right away if you have:  More than a small spotting of blood in your stool.  Large blood clots in your stool.  Swelling of your abdomen.  Nausea or vomiting.  A  fever.  Increasing pain in your abdomen that is not relieved with medicine. Summary  After the procedure, it is common to have a small amount of blood in your stool. You may also have mild cramping and bloating of your abdomen.  For the first 24 hours after the procedure, do not drive or use machinery, sign important documents, or drink alcohol.  Get help right away if you have a lot of blood in your stool, nausea or vomiting, a fever, or increased pain in your abdomen. This information is not intended to replace advice given to you by your health care provider. Make sure you discuss any questions you have with your health care provider. Document Revised: 09/08/2018 Document Reviewed: 09/08/2018 Elsevier Patient Education  [place] After These instructions provide you with information about caring for yourself after your procedure. Your health care provider may also give you more specific instructions. Your treatment has been planned according to current medical practices, but problems sometimes occur. Call your health care provider if you have any problems or questions after your procedure. What can I expect after the procedure? After your procedure, you may:  Feel sleepy for several hours.  Feel clumsy and have poor balance for several hours.  Feel forgetful about what happened after the procedure.  Have poor judgment for several hours.  Feel nauseous or vomit.  Have a sore throat if you had a breathing tube during the procedure. Follow these instructions at home: For at least 24 hours after the procedure:      Have a responsible adult stay with you. It is important to have someone help care for you until you are awake and alert.  Rest as needed.  Do not: ? Participate in activities in which you could fall or become injured. ? Drive. ? Use heavy machinery. ? Drink alcohol. ? Take sleeping pills or medicines that cause  drowsiness. ? Make important decisions or sign legal documents. ? Take care of children on your own. Eating and drinking  Follow the diet that is recommended by your health care provider.  If you vomit, drink water, juice, or soup when you can drink without vomiting.  Make sure you have little or no nausea before eating solid foods. General instructions  Take over-the-counter and prescription medicines only as told by your health care provider.  If you have sleep apnea, surgery and certain medicines can increase your risk for breathing problems. Follow instructions from your health care provider about wearing your sleep device: ? Anytime you are sleeping, including during daytime naps. ? While taking prescription pain medicines, sleeping medicines, or medicines that make you drowsy.  If you smoke, do not smoke without supervision.  Keep all follow-up visits as told by your health care provider. This is important. Contact  a health care provider if:  You keep feeling nauseous or you keep vomiting.  You feel light-headed.  You develop a rash.  You have a fever. Get help right away if:  You have trouble breathing. Summary  For several hours after your procedure, you may feel sleepy and have poor judgment.  Have a responsible adult stay with you for at least 24 hours or until you are awake and alert. This information is not intended to replace advice given to you by your health care provider. Make sure you discuss any questions you have with your health care provider. Document Revised: 05/13/2017 Document Reviewed: 06/05/2015 Elsevier Patient Education  [place].

## 2019-11-12 ENCOUNTER — Encounter (HOSPITAL_COMMUNITY): Payer: Self-pay

## 2019-11-12 ENCOUNTER — Inpatient Hospital Stay (HOSPITAL_COMMUNITY)
Admission: RE | Admit: 2019-11-12 | Discharge: 2019-11-12 | Disposition: A | Discharge status: Home / Self Care | Payer: Medicare Other | Source: Ambulatory Visit

## 2019-11-12 ENCOUNTER — Encounter (HOSPITAL_COMMUNITY)
Admission: RE | Admit: 2019-11-12 | Discharge: 2019-11-12 | Disposition: A | Discharge status: Home / Self Care | Payer: Medicare Other | Source: Ambulatory Visit | Attending: Internal Medicine | Admitting: Internal Medicine

## 2019-11-16 ENCOUNTER — Telehealth: Payer: Self-pay | Admitting: Internal Medicine

## 2019-11-16 NOTE — Telephone Encounter (Signed)
[phone] patient care facility called and said that the prep for patient procedure tomorrow is not working,  Please call them

## 2019-11-16 NOTE — Telephone Encounter (Signed)
Called # provided below for facility. Line rang numerous times, no answer and no VM

## 2019-11-17 ENCOUNTER — Other Ambulatory Visit (HOSPITAL_COMMUNITY)
Admission: RE | Admit: 2019-11-17 | Discharge: 2019-11-17 | Disposition: A | Discharge status: Home / Self Care | Payer: Medicare Other | Source: Ambulatory Visit | Attending: Internal Medicine | Admitting: Internal Medicine

## 2019-11-17 ENCOUNTER — Ambulatory Visit (HOSPITAL_COMMUNITY): Payer: Medicare Other | Admitting: Anesthesiology

## 2019-11-17 ENCOUNTER — Encounter (HOSPITAL_COMMUNITY)
Admission: RE | Disposition: A | Discharge status: Home / Self Care | Payer: Self-pay | Source: Home / Self Care | Attending: Internal Medicine

## 2019-11-17 ENCOUNTER — Other Ambulatory Visit: Payer: Self-pay

## 2019-11-17 ENCOUNTER — Telehealth: Payer: Self-pay | Admitting: Gastroenterology

## 2019-11-17 ENCOUNTER — Ambulatory Visit (HOSPITAL_COMMUNITY)
Admission: RE | Admit: 2019-11-17 | Discharge: 2019-11-17 | Disposition: A | Discharge status: Home / Self Care | Payer: Medicare Other | Attending: Internal Medicine | Admitting: Internal Medicine

## 2019-11-17 DIAGNOSIS — Z79899 Other long term (current) drug therapy: Secondary | ICD-10-CM | POA: Insufficient documentation

## 2019-11-17 DIAGNOSIS — I2510 Atherosclerotic heart disease of native coronary artery without angina pectoris: Secondary | ICD-10-CM | POA: Insufficient documentation

## 2019-11-17 DIAGNOSIS — I252 Old myocardial infarction: Secondary | ICD-10-CM | POA: Insufficient documentation

## 2019-11-17 DIAGNOSIS — Z7982 Long term (current) use of aspirin: Secondary | ICD-10-CM | POA: Diagnosis not present

## 2019-11-17 DIAGNOSIS — Z89612 Acquired absence of left leg above knee: Secondary | ICD-10-CM | POA: Insufficient documentation

## 2019-11-17 DIAGNOSIS — E785 Hyperlipidemia, unspecified: Secondary | ICD-10-CM | POA: Diagnosis not present

## 2019-11-17 DIAGNOSIS — Z794 Long term (current) use of insulin: Secondary | ICD-10-CM | POA: Insufficient documentation

## 2019-11-17 DIAGNOSIS — Z20822 Contact with and (suspected) exposure to covid-19: Secondary | ICD-10-CM | POA: Diagnosis not present

## 2019-11-17 DIAGNOSIS — I10 Essential (primary) hypertension: Secondary | ICD-10-CM | POA: Insufficient documentation

## 2019-11-17 DIAGNOSIS — Z01812 Encounter for preprocedural laboratory examination: Secondary | ICD-10-CM | POA: Insufficient documentation

## 2019-11-17 DIAGNOSIS — Z1211 Encounter for screening for malignant neoplasm of colon: Secondary | ICD-10-CM | POA: Diagnosis not present

## 2019-11-17 DIAGNOSIS — E1151 Type 2 diabetes mellitus with diabetic peripheral angiopathy without gangrene: Secondary | ICD-10-CM | POA: Insufficient documentation

## 2019-11-17 DIAGNOSIS — Z955 Presence of coronary angioplasty implant and graft: Secondary | ICD-10-CM | POA: Diagnosis not present

## 2019-11-17 HISTORY — PX: FLEXIBLE SIGMOIDOSCOPY: SHX5431

## 2019-11-17 LAB — SARS CORONAVIRUS 2 BY RT PCR (HOSPITAL ORDER, PERFORMED IN ~~LOC~~ HOSPITAL LAB): SARS Coronavirus 2: NEGATIVE

## 2019-11-17 LAB — BASIC METABOLIC PANEL
Anion gap: 6 (ref 5–15)
BUN: 16 mg/dL (ref 6–20)
CO2: 27 mmol/L (ref 22–32)
Calcium: 8.5 mg/dL — ABNORMAL LOW (ref 8.9–10.3)
Chloride: 103 mmol/L (ref 98–111)
Creatinine, Ser: 1.36 mg/dL — ABNORMAL HIGH (ref 0.61–1.24)
GFR calc Af Amer: >60 mL/min (ref >60)
GFR calc non Af Amer: 56 mL/min — ABNORMAL LOW (ref >60)
Glucose, Bld: 90 mg/dL (ref 70–99)
Potassium: 4.6 mmol/L (ref 3.5–5.1)
Sodium: 136 mmol/L (ref 135–145)

## 2019-11-17 LAB — GLUCOSE, CAPILLARY
Glucose-Capillary: 88 mg/dL (ref 70–99)
Glucose-Capillary: 191 mg/dL — ABNORMAL HIGH (ref 70–99)
Glucose-Capillary: 67 mg/dL — ABNORMAL LOW (ref 70–99)

## 2019-11-17 SURGERY — SIGMOIDOSCOPY, FLEXIBLE
Anesthesia: General

## 2019-11-17 MED ORDER — LACTATED RINGERS IV SOLN: Freq: Once | INTRAVENOUS | Status: AC

## 2019-11-17 MED ORDER — PROPOFOL 500 MG/50ML IV EMUL
INTRAVENOUS | Status: DC | PRN
  Administered 2019-11-17: 100 ug/kg/min via INTRAVENOUS

## 2019-11-17 MED ORDER — DEXTROSE 50 % IV SOLN: 1.0000 | Freq: Once | INTRAVENOUS | Status: DC

## 2019-11-17 MED ORDER — LACTATED RINGERS IV SOLN: INTRAVENOUS | Status: DC | PRN

## 2019-11-17 MED ORDER — DEXTROSE 50 % IV SOLN
INTRAVENOUS | Status: AC
  Filled 2019-11-17: qty 50

## 2019-11-17 NOTE — Telephone Encounter (Signed)
Two failed attempts for colonoscopy due to poor bowel prep. Discussed with Dr. Marletta Lor, please arrange for a Cologuard.

## 2019-11-17 NOTE — Op Note (Signed)
[facility] Patient Name: Oscar Elliott Procedure Date: 11/17/2019 11:13 AM MRN: [id number] Date of Birth: 1960/11/22 Attending MD: Elon Alas. Abbey Chatters DO CSN: [id number] Age: 59 Admit Type: Outpatient Procedure:                Colonoscopy Indications:              Screening for colorectal malignant neoplasm Providers:                Elon Alas. Abbey Chatters, DO, Caprice Kluver, Crystal Page,                            Casimer Bilis, Technician, Aram Candela Referring MD:              Medicines:                See the Anesthesia note for documentation of the                            administered medications Complications:            No immediate complications. Estimated Blood Loss:     Estimated blood loss: none. Procedure:                Pre-Anesthesia Assessment:                           - The anesthesia plan was to use monitored                            anesthesia care (MAC).                           After obtaining informed consent, the colonoscope                            was passed under direct vision. Throughout the                            procedure, the patient's blood pressure, pulse, and                            oxygen saturations were monitored continuously. The                            PCF-H190DL ([id number]) scope was introduced through                            the anus with the intention of advancing to the                            cecum. The scope was advanced to the sigmoid colon                            before the procedure was aborted. Medications were  given. The PCF-H190DL ([id number]) scope was                            introduced through the and advanced to the. The                            colonoscopy was performed without difficulty. The                            patient tolerated the procedure well. The quality                            of the bowel preparation was evaluated using the                             BBPS [facility] Bowel Preparation Scale) with scores                            of: Right Colon = 0 (unprepared, mucosa not seen                            due to solid stool that cannot be cleared or unseen                            proximal colon segment in a colonoscopy aborted due                            to inadequate bowel prep). The total BBPS score                            equals 0. The quality of the bowel preparation was                            inadequate. Scope In: 12:17:03 PM Scope Out: 12:18:12 PM Total Procedure Duration: 0 hours 1 minute 9 seconds  Findings:      The perianal and digital rectal examinations were normal.      Copious quantities of stool was found in the sigmoid colon, precluding       visualization. Lavage of the area was performed using a large amount,       resulting in incomplete clearance with continued poor visualization. Impression:               - Preparation of the colon was inadequate.                           - Stool in the sigmoid colon.                           - No specimens collected. Moderate Sedation:      Per Anesthesia Care Recommendation:           - Patient has a contact number available for  emergencies. The signs and symptoms of potential                            delayed complications were discussed with the                            patient. Return to normal activities tomorrow.                            Written discharge instructions were provided to the                            patient.                           - Resume previous diet.                           - Continue present medications.                           - Return to GI clinic with Magda Paganini as previously                            scheduled.                           - Consider alternative colon cancer screening                            modality such as Cologuard. Procedure Code(s):        --- Professional ---                            B5830, 53, Colorectal cancer screening; colonoscopy                            on individual not meeting criteria for high risk Diagnosis Code(s):        --- Professional ---                           Z12.11, Encounter for screening for malignant                            neoplasm of colon CPT copyright 2019 American Medical Association. All rights reserved. The codes documented in this report are preliminary and upon coder review may  be revised to meet current compliance requirements. Elon Alas. Abbey Chatters, [place] Abbey Chatters, DO 11/17/2019 12:24:32 PM This report has been signed electronically. Number of Addenda: 0

## 2019-11-17 NOTE — H&P (Signed)
Primary Care Physician:  Anselm Jungling, NP Primary Gastroenterologist:  Dr. Marletta Lor  Pre-Procedure History & Physical: HPI:  Oscar Elliott is a 59 y.o. male is here for a screening colonoscopy.   Past Medical History:  Diagnosis Date  . CAD (coronary artery disease)    a. 10/2013 Lateral STEMI/PCI: LM nl, LAD min irregs, D1 occluded sub branch, LCX min irregs mid, OM large/patent, RCA 163m (2.75x28 Promus DES).  . Cholelithiasis   . Essential hypertension   . History of renal failure    a. 06/2007: in setting of rhabdo post L AKA.  Marland Kitchen Hyperlipidemia   . MI (myocardial infarction) (HCC)   . Nephrolithiasis   . Peripheral vascular disease (HCC)    a. 06/2007: s/p L AKA @ [facility]  . Schizophrenia (HCC)   . Type 2 diabetes mellitus (HCC)     Past Surgical History:  Procedure Laterality Date  . Above-the-knee amputation Left   . COLONOSCOPY WITH PROPOFOL N/A 12/09/2018   fields: nonthrombosed hemorrhoids, decreased anal sphincter tone. poor prep, colonoscopy aborted  . LEFT HEART CATHETERIZATION WITH CORONARY ANGIOGRAM N/A 11/20/2013   Procedure: LEFT HEART CATHETERIZATION WITH CORONARY ANGIOGRAM;  Surgeon: Corky Crafts, MD;  Location: [facility] CATH LAB;  Service: Cardiovascular;  Laterality: N/A;    Prior to Admission medications   Medication Sig Start Date End Date Taking? Authorizing Provider  acetaminophen (TYLENOL) 500 MG tablet Take 500 mg by mouth every 4 (four) hours as needed for moderate pain.   Yes [provider]  ARIPiprazole (ABILIFY) 15 MG tablet Take 1 tablet (15 mg total) by mouth daily. 03/26/19  Yes Zena Amos, MD  aspirin EC 81 MG tablet Take 81 mg by mouth daily.   Yes [provider]  atorvastatin (LIPITOR) 80 MG tablet Take 1 tablet (80 mg total) by mouth daily at 6 PM. 05/31/16  Yes Memon, Durward Mallard, MD  Cholecalciferol (VITAMIN D) 50 MCG (2000 UT) CAPS Take 2,000 Units by mouth once a week.    Yes [provider]  clonazePAM (KLONOPIN)  0.5 MG tablet Take 1 tablet (0.5 mg total) by mouth at bedtime. Patient taking differently: Take 0.5 mg by mouth every evening.  09/17/19  Yes Hisada, Barbee Cough, MD  cloZAPine (CLOZARIL) 100 MG tablet 100 mg in AM and 500 mg at night Patient taking differently: Take 100-500 mg by mouth See admin instructions. Take 100 mg by mouth in morning and 500 mg at night 03/26/19  Yes Zena Amos, MD  diclofenac Sodium (VOLTAREN) 1 % GEL Apply 2 g topically 4 (four) times daily as needed (pain).    Yes [provider]  docusate sodium (COLACE) 100 MG capsule Take 100 mg by mouth 2 (two) times daily.   Yes [provider]  DULoxetine (CYMBALTA) 60 MG capsule Take 2 capsules (120 mg total) by mouth daily. 08/20/19  Yes Neysa Hotter, MD  famotidine (PEPCID) 20 MG tablet Take 20 mg by mouth daily as needed for heartburn or indigestion.   Yes [provider]  gabapentin (NEURONTIN) 100 MG capsule Take 100 mg by mouth 2 (two) times daily.   Yes [provider]  hydrOXYzine (ATARAX/VISTARIL) 25 MG tablet Take 25 mg by mouth 2 (two) times daily as needed for anxiety or itching.   Yes [provider]  insulin aspart (NOVOLOG) 100 UNIT/ML injection Inject 2 Units into the skin 3 (three) times daily before meals. Patient taking differently: Inject 2 Units into the skin 3 (three) times daily as needed (blood  sugar greater than 200 and resident eats a meal). Hold if resident does not eat 06/01/16  Yes Memon, Durward Mallard, MD  insulin detemir (LEVEMIR) 100 UNIT/ML injection Inject 0.3 mLs (30 Units total) into the skin daily. Patient taking differently: Inject 35 Units into the skin daily. Hold if pt does not eat . 05/31/16  Yes Erick Blinks, MD  ketoconazole (NIZORAL) 2 % shampoo Apply 1 application topically 2 (two) times a week.   Yes [provider]  latanoprost (XALATAN) 0.005 % ophthalmic solution Place 1 drop into both eyes at bedtime.   Yes [provider]   lisinopril (ZESTRIL) 10 MG tablet Take 10 mg by mouth daily. Hold if systolic is less than 100   Yes [provider]  metoprolol (LOPRESSOR) 50 MG tablet Take 1 tablet (50 mg total) by mouth 2 (two) times daily. 12/02/13  Yes Dyann Kief, PA-C  nitroGLYCERIN (NITROSTAT) 0.4 MG SL tablet Place 1 tablet (0.4 mg total) under the tongue every 5 (five) minutes x 3 doses as needed for chest pain. 11/23/13  Yes Barrett, Joline Salt, PA-C  omeprazole (PRILOSEC) 40 MG capsule Take 40 mg by mouth 2 (two) times daily.    Yes [provider]  Plecanatide (TRULANCE) 3 MG TABS Take 3 mg by mouth daily. 09/11/19  Yes Anice Paganini, NP  polyethylene glycol (MIRALAX / GLYCOLAX) 17 g packet Take 17 g by mouth daily as needed for moderate constipation.   Yes [provider]  tamsulosin (FLOMAX) 0.4 MG CAPS capsule Take 0.4 mg by mouth daily.   Yes [provider]  clopidogrel (PLAVIX) 75 MG tablet TAKE (1) TABLET BY MOUTH ONCE DAILY. Patient not taking: Reported on 11/05/2019 09/16/19   Jonelle Sidle, MD  polyethylene glycol-electrolytes (NULYTELY) 420 g solution As directed 06/16/19   Corbin Ade, MD    Allergies as of 10/06/2019 - Review Complete 06/16/2019  Allergen Reaction Noted  . Penicillins Other (See Comments) 11/20/2013    Family History  Problem Relation Age of Onset  . Other Other        no premature CAD.    Social History   Socioeconomic History  . Marital status: Single    Spouse name: Not on file  . Number of children: Not on file  . Years of education: Not on file  . Highest education level: Not on file  Occupational History  . Not on file  Tobacco Use  . Smoking status: Current Every Day Smoker    Packs/day: 0.25    Years: 20.00    Pack years: 5.00    Types: Cigars  . Smokeless tobacco: Never Used  . Tobacco comment: 1-2 cig daily  Vaping Use  . Vaping Use: Never used  Substance and Sexual Activity  . Alcohol use: No    Alcohol/week:  0.0 standard drinks  . Drug use: No  . Sexual activity: Never  Other Topics Concern  . Not on file  Social History Narrative   Single, no children   Social Determinants of Health   Financial Resource Strain:   . Difficulty of Paying Living Expenses: Not on file  Food Insecurity:   . Worried About Programme researcher, broadcasting/film/video in the Last Year: Not on file  . Ran Out of Food in the Last Year: Not on file  Transportation Needs:   . Lack of Transportation (Medical): Not on file  . Lack of Transportation (Non-Medical): Not on file  Physical Activity:   .  Days of Exercise per Week: Not on file  . Minutes of Exercise per Session: Not on file  Stress:   . Feeling of Stress : Not on file  Social Connections:   . Frequency of Communication with Friends and Family: Not on file  . Frequency of Social Gatherings with Friends and Family: Not on file  . Attends Religious Services: Not on file  . Active Member of Clubs or Organizations: Not on file  . Attends Banker Meetings: Not on file  . Marital Status: Not on file  Intimate Partner Violence:   . Fear of Current or Ex-Partner: Not on file  . Emotionally Abused: Not on file  . Physically Abused: Not on file  . Sexually Abused: Not on file    Review of Systems: See HPI, otherwise negative ROS  Impression/Plan: Oscar Elliott is here for a colonoscopy to be performed for screening  Risks, benefits, limitations, imponderables and alternatives regarding colonoscopy have been reviewed with the patient. Questions have been answered. All parties agreeable.

## 2019-11-17 NOTE — Anesthesia Preprocedure Evaluation (Signed)
Anesthesia Evaluation  Patient identified by MRN, date of birth, ID band Patient awake    Reviewed: Allergy & Precautions, NPO status , Patient's Chart, lab work & pertinent test results, reviewed documented beta blocker date and time   History of Anesthesia Complications Negative for: history of anesthetic complications  Airway Mallampati: II  TM Distance: >3 FB Neck ROM: Full    Dental  (+) Edentulous Upper, Edentulous Lower   Pulmonary Current Smoker and Patient abstained from smoking.,    Pulmonary exam normal breath sounds clear to auscultation       Cardiovascular Exercise Tolerance: Poor hypertension, Pt. on medications and Pt. on home beta blockers + CAD, + Past MI, + Cardiac Stents and + Peripheral Vascular Disease  Normal cardiovascular exam Rhythm:Regular Rate:Normal     Neuro/Psych PSYCHIATRIC DISORDERS Schizophrenia    GI/Hepatic Neg liver ROS, Bowel prep,  Endo/Other  diabetes, Well Controlled, Type 2, Oral Hypoglycemic Agents  Renal/GU Renal hypertension and Renal InsufficiencyRenal disease  negative genitourinary   Musculoskeletal negative musculoskeletal ROS (+)   Abdominal   Peds negative pediatric ROS (+)  Hematology   Anesthesia Other Findings   Reproductive/Obstetrics negative OB ROS                            Anesthesia Physical Anesthesia Plan  ASA: III  Anesthesia Plan: General   Post-op Pain Management:    Induction: Intravenous  PONV Risk Score and Plan: TIVA  Airway Management Planned: Nasal Cannula and Natural Airway  Additional Equipment:   Intra-op Plan:   Post-operative Plan:   Informed Consent: I have reviewed the patients History and Physical, chart, labs and discussed the procedure including the risks, benefits and alternatives for the proposed anesthesia with the patient or authorized representative who has indicated his/her understanding  and acceptance.       Plan Discussed with: Surgeon and CRNA  Anesthesia Plan Comments:        Anesthesia Quick Evaluation

## 2019-11-17 NOTE — Anesthesia Postprocedure Evaluation (Signed)
Anesthesia Post Note  Patient: Oscar Elliott  Procedure(s) Performed: FLEXIBLE SIGMOIDOSCOPY  Patient location during evaluation: PACU Anesthesia Type: General Level of consciousness: awake, oriented, awake and alert and patient cooperative Pain management: satisfactory to patient Vital Signs Assessment: post-procedure vital signs reviewed and stable Respiratory status: spontaneous breathing, respiratory function stable and nonlabored ventilation Cardiovascular status: stable Postop Assessment: no apparent nausea or vomiting Anesthetic complications: no   No complications documented.   Last Vitals:  Vitals:   11/17/19 1130 11/17/19 1145  BP: 104/74   Resp: 20 17  Temp: 37 C   SpO2: 98%     Last Pain:  Vitals:   11/17/19 1213  PainSc: 9                  Rommie Dunn

## 2019-11-17 NOTE — Transfer of Care (Signed)
Immediate Anesthesia Transfer of Care Note  Patient: Oscar Elliott  Procedure(s) Performed: FLEXIBLE SIGMOIDOSCOPY  Patient Location: PACU  Anesthesia Type:General  Level of Consciousness: awake, alert , oriented and patient cooperative  Airway & Oxygen Therapy: Patient Spontanous Breathing  Post-op Assessment: Report given to RN and Post -op Vital signs reviewed and stable  Post vital signs: Reviewed and stable  Last Vitals:  Vitals Value Taken Time  BP    Temp    Pulse    Resp    SpO2      Last Pain:  Vitals:   11/17/19 1213  PainSc: 9          Complications: No complications documented.

## 2019-11-17 NOTE — Discharge Instructions (Addendum)
Colonoscopy Discharge Instructions  Read the instructions outlined below and refer to this sheet in the next few weeks. These discharge instructions provide you with general information on caring for yourself after you leave the hospital. Your doctor may also give you specific instructions. While your treatment has been planned according to the most current medical practices available, unavoidable complications occasionally occur.   ACTIVITY  You may resume your regular activity, but move at a slower pace for the next 24 hours.   Take frequent rest periods for the next 24 hours.   Walking will help get rid of the air and reduce the bloated feeling in your belly (abdomen).   No driving for 24 hours (because of the medicine (anesthesia) used during the test).    Do not sign any important legal documents or operate any machinery for 24 hours (because of the anesthesia used during the test).  NUTRITION  Drink plenty of fluids.   You may resume your normal diet as instructed by your doctor.   Begin with a light meal and progress to your normal diet. Heavy or fried foods are harder to digest and may make you feel sick to your stomach (nauseated).   Avoid alcoholic beverages for 24 hours or as instructed.  MEDICATIONS  You may resume your normal medications unless your doctor tells you otherwise.  WHAT YOU CAN EXPECT TODAY  Some feelings of bloating in the abdomen.   Passage of more gas than usual.   Spotting of blood in your stool or on the toilet paper.  IF YOU HAD POLYPS REMOVED DURING THE COLONOSCOPY:  No aspirin products for 7 days or as instructed.   No alcohol for 7 days or as instructed.   Eat a soft diet for the next 24 hours.  FINDING OUT THE RESULTS OF YOUR TEST Not all test results are available during your visit. If your test results are not back during the visit, make an appointment with your caregiver to find out the results. Do not assume everything is normal if  you have not heard from your caregiver or the medical facility. It is important for you to follow up on all of your test results.  SEEK IMMEDIATE MEDICAL ATTENTION IF:  You have more than a spotting of blood in your stool.   Your belly is swollen (abdominal distention).   You are nauseated or vomiting.   You have a temperature over 101.   You have abdominal pain or discomfort that is severe or gets worse throughout the day.   Unfortunately your colon was not adequately prepped for me to perform a screening colonoscopy.  At this point, I would recommend other colon cancer screening modality such as Cologuard.  If that is positive then we can devise a plan with your care team to ensure adequate colon preparation on repeat colonoscopy.  Follow-up with GI as previously scheduled.  I hope you have a great rest of your week!  Hennie Duos. Marletta Lor, D.O. Gastroenterology and Hepatology [facility]     Monitored Anesthesia Care, Care After These instructions provide you with information about caring for yourself after your procedure. Your health care provider may also give you more specific instructions. Your treatment has been planned according to current medical practices, but problems sometimes occur. Call your health care provider if you have any problems or questions after your procedure. What can I expect after the procedure? After your procedure, you may:  Feel sleepy for several hours.  Feel  clumsy and have poor balance for several hours.  Feel forgetful about what happened after the procedure.  Have poor judgment for several hours.  Feel nauseous or vomit.  Have a sore throat if you had a breathing tube during the procedure. Follow these instructions at home: For at least 24 hours after the procedure:      Have a responsible adult stay with you. It is important to have someone help care for you until you are awake and alert.  Rest as  needed.  Do not: ? Participate in activities in which you could fall or become injured. ? Drive. ? Use heavy machinery. ? Drink alcohol. ? Take sleeping pills or medicines that cause drowsiness. ? Make important decisions or sign legal documents. ? Take care of children on your own. Eating and drinking  Follow the diet that is recommended by your health care provider.  If you vomit, drink water, juice, or soup when you can drink without vomiting.  Make sure you have little or no nausea before eating solid foods. General instructions  Take over-the-counter and prescription medicines only as told by your health care provider.  If you have sleep apnea, surgery and certain medicines can increase your risk for breathing problems. Follow instructions from your health care provider about wearing your sleep device: ? Anytime you are sleeping, including during daytime naps. ? While taking prescription pain medicines, sleeping medicines, or medicines that make you drowsy.  If you smoke, do not smoke without supervision.  Keep all follow-up visits as told by your health care provider. This is important. Contact a health care provider if:  You keep feeling nauseous or you keep vomiting.  You feel light-headed.  You develop a rash.  You have a fever. Get help right away if:  You have trouble breathing. Summary  For several hours after your procedure, you may feel sleepy and have poor judgment.  Have a responsible adult stay with you for at least 24 hours or until you are awake and alert. This information is not intended to replace advice given to you by your health care provider. Make sure you discuss any questions you have with your health care provider. Document Revised: 05/13/2017 Document Reviewed: 06/05/2015 Elsevier Patient Education  2020 [employer].

## 2019-11-17 NOTE — Progress Notes (Signed)
To [facility] via w/c. BM of clear yellow fluid with soft brown formed stool. Returned to w/c and returned to stretcher. Dr Marletta Lor notified. Will proceed with procedure.

## 2019-11-17 NOTE — Progress Notes (Signed)
Received report from [facility]

## 2019-11-18 NOTE — Telephone Encounter (Signed)
Noted. Spoke with Arlys John. Cologuard form was mailed to [name]. He will return form once signed to submit for Cologuard.

## 2019-11-23 ENCOUNTER — Encounter (HOSPITAL_COMMUNITY): Payer: Self-pay | Admitting: Internal Medicine

## 2019-12-10 IMAGING — US US RENAL
1 series · 14 of 25 positions shown · non-contrast
Comparison: CT abdomen and pelvis Marcello 11/15/2013

CLINICAL DATA: Stage III chronic renal disease

EXAM:
RENAL / URINARY TRACT ULTRASOUND COMPLETE

[Series 1: us renal · 0.23mm/px · 14 of 47 slices shown]
[im 1/47]
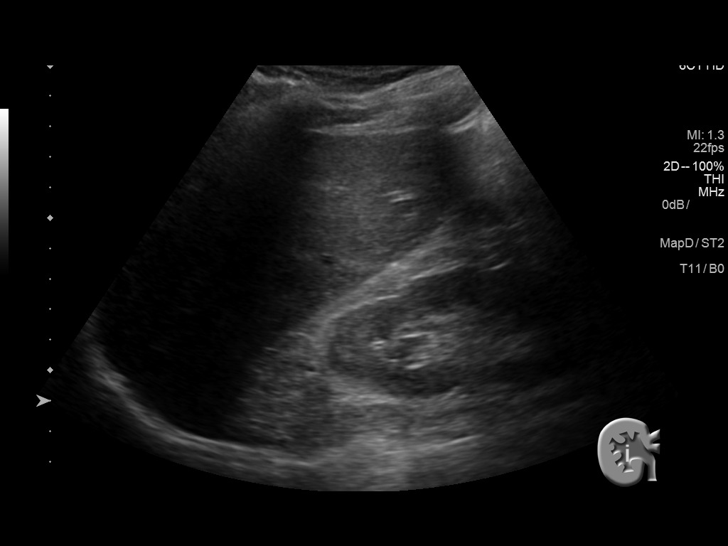
[im 4/47]
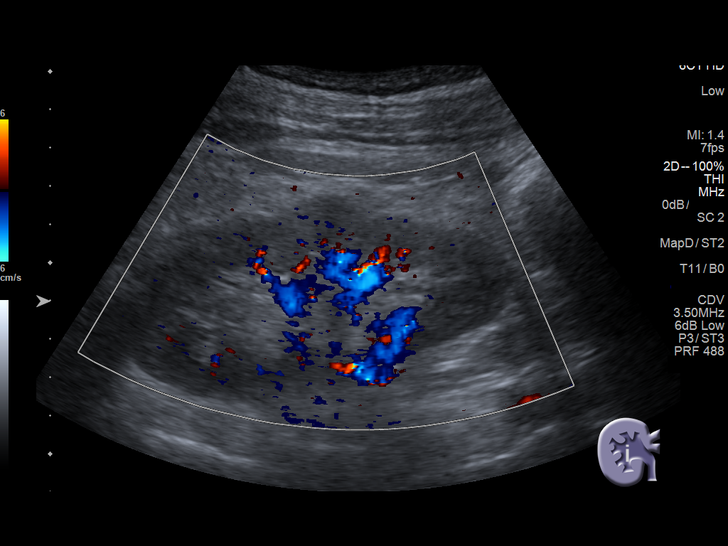
[im 8/47]
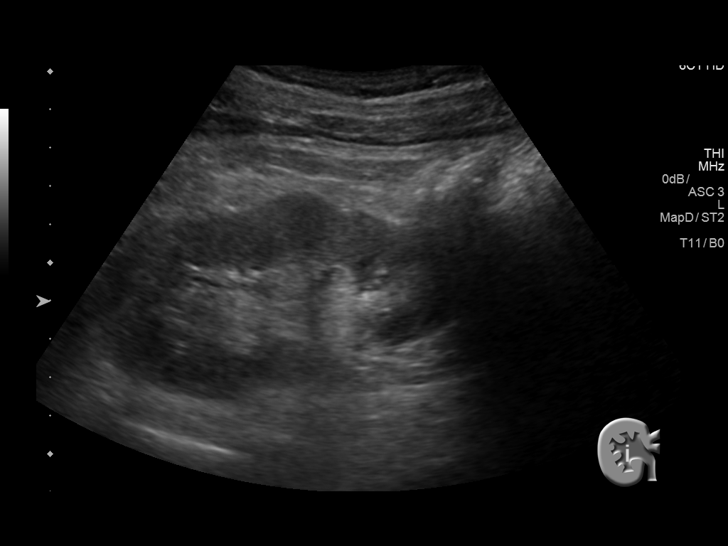
[im 12/47]
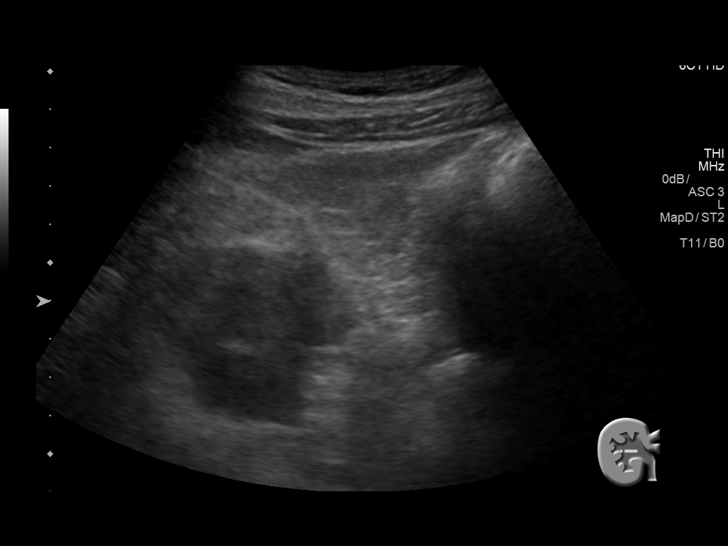
[im 16/47]
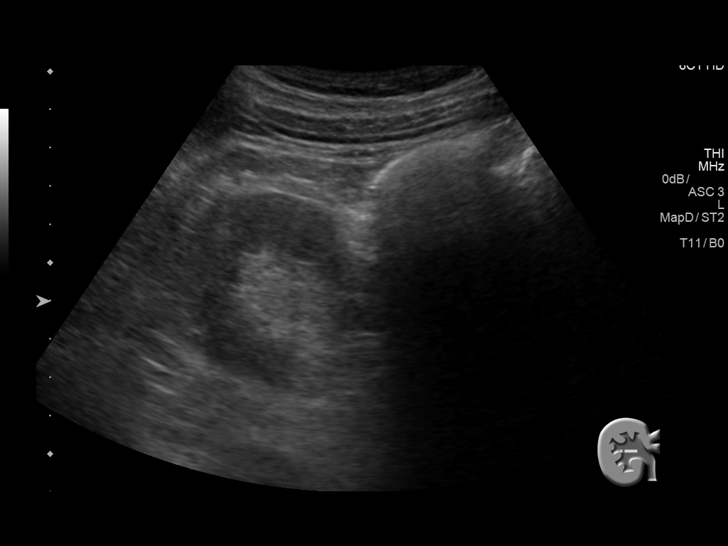
[im 18/47]
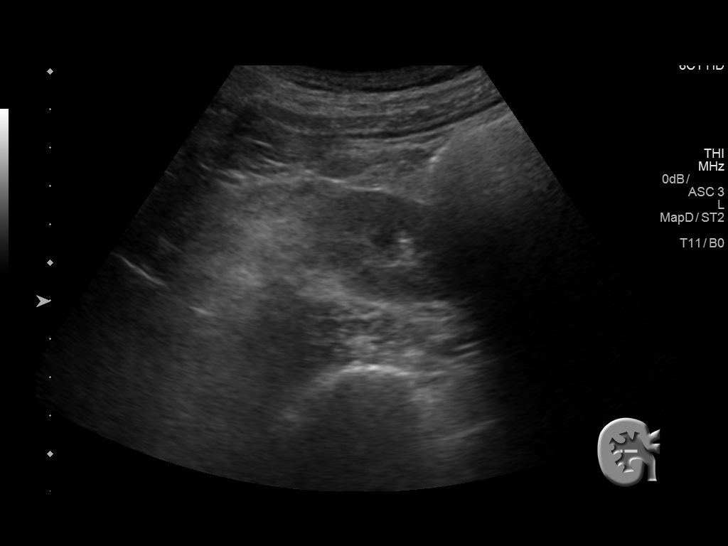
[im 22/47]
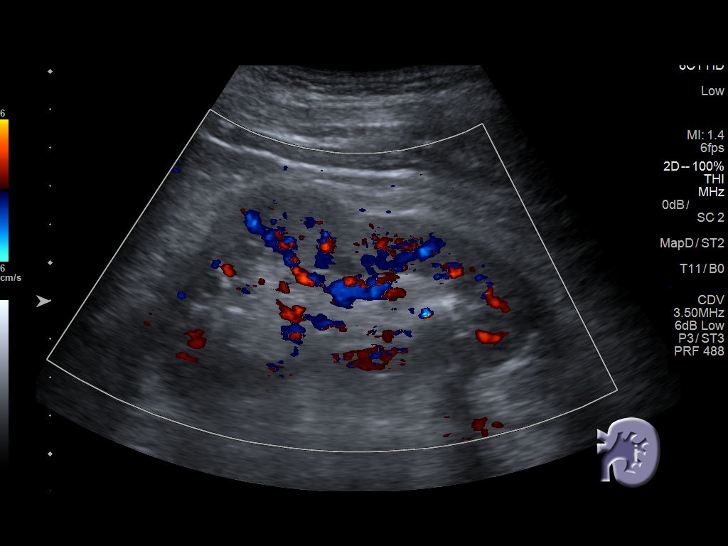
[im 25/47]
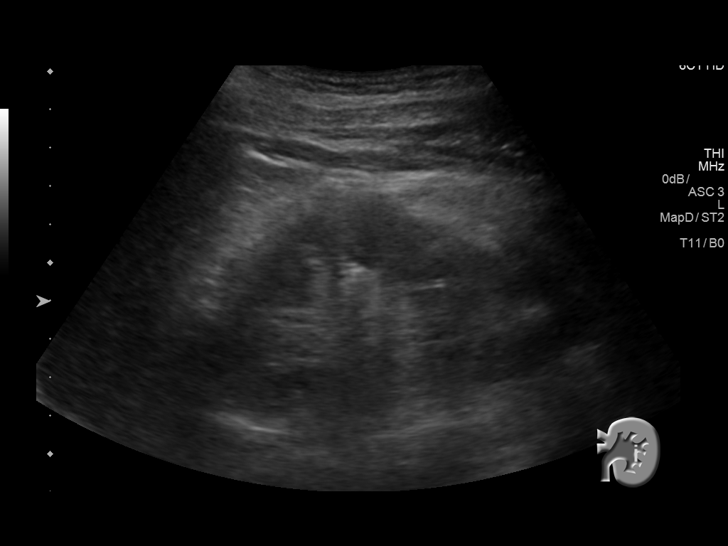
[im 29/47]
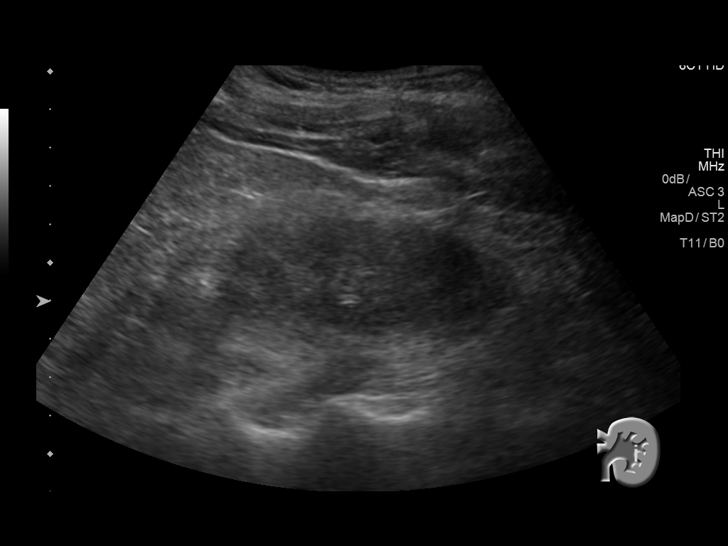
[im 31/47]
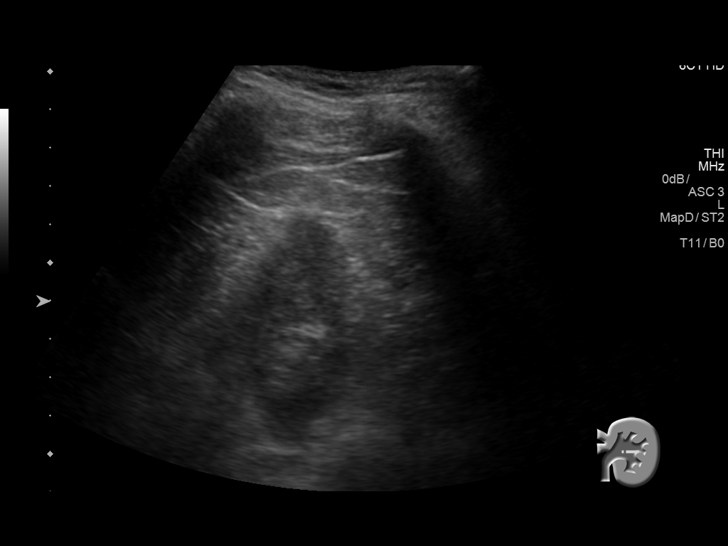
[im 35/47]
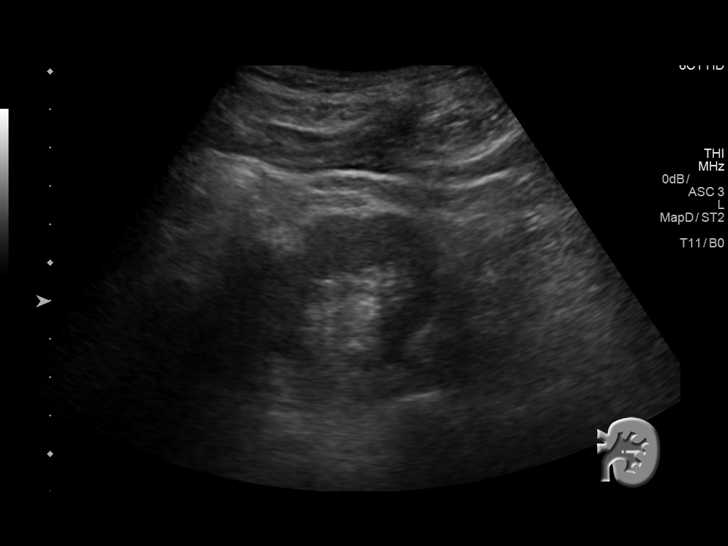
[im 39/47]
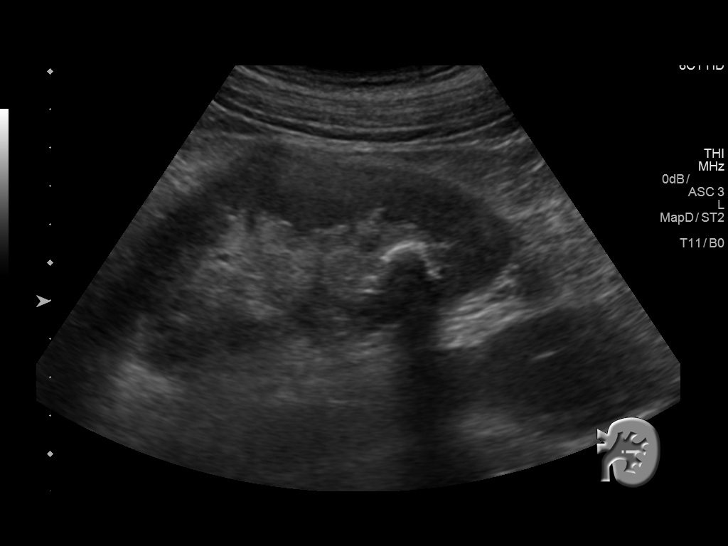
[im 43/47]
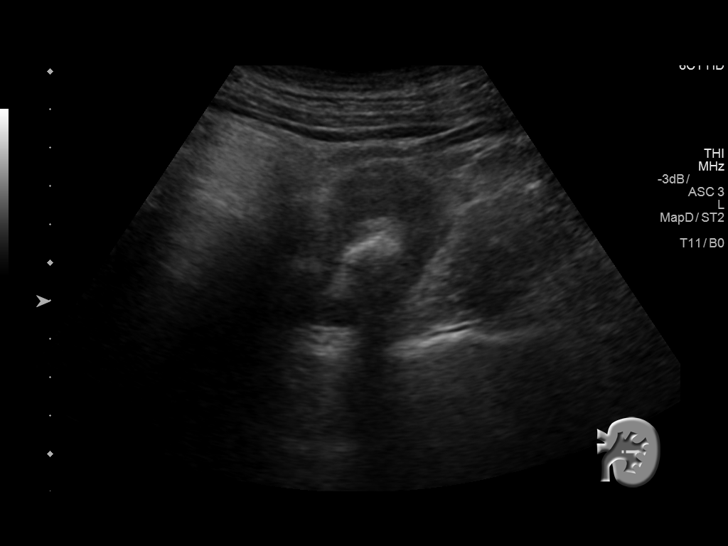
[im 47/47]
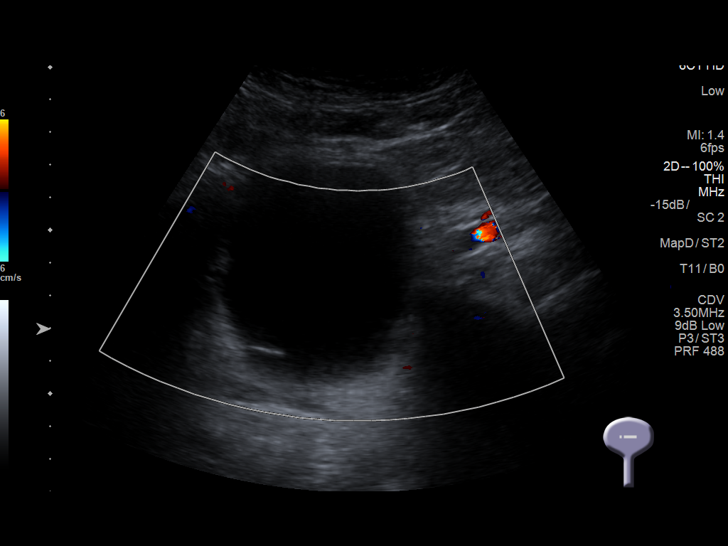

[14 of 25 positions shown; findings below may reference images not displayed]

FINDINGS: Right Kidney:

Length: 10.4 cm. Echogenicity and renal cortical thickness are
within normal limits. No mass, perinephric fluid, or hydronephrosis
visualized. No sonographically demonstrable calculus or
ureterectasis.

Left Kidney:

Length: 10.5 cm. Echogenicity and renal cortical thickness are
within normal limits. No mass, perinephric fluid, or hydronephrosis
visualized. There is a calculus in the lower pole of the left kidney
measuring 1.2 cm in length. No ureterectasis.

Bladder:

Appears normal for degree of bladder distention.
IMPRESSION: Nonobstructing calculus measuring approximately 1.2 cm in the lower
pole left kidney. No obstructing focus on either side. Renal
cortical thickness and echogenicity are normal. Renal size within
normal limits and symmetric bilaterally.

## 2020-01-12 ENCOUNTER — Telehealth: Payer: Self-pay | Admitting: Cardiology

## 2020-01-12 NOTE — Telephone Encounter (Signed)
°  Patient Consent for Virtual Visit         Oscar Elliott has provided verbal consent on 01/12/2020 for a virtual visit (video or telephone).   CONSENT FOR VIRTUAL VISIT FOR:  Oscar Elliott  By participating in this virtual visit I agree to the following:  I hereby voluntarily request, consent and authorize [facility] and its employed or contracted physicians, physician assistants, nurse practitioners or other licensed health care professionals (the Practitioner), to provide me with telemedicine health care services (the Services") as deemed necessary by the treating Practitioner. I acknowledge and consent to receive the Services by the Practitioner via telemedicine. I understand that the telemedicine visit will involve communicating with the Practitioner through live audiovisual communication technology and the disclosure of certain medical information by electronic transmission. I acknowledge that I have been given the opportunity to request an in-person assessment or other available alternative prior to the telemedicine visit and am voluntarily participating in the telemedicine visit.  I understand that I have the right to withhold or withdraw my consent to the use of telemedicine in the course of my care at any time, without affecting my right to future care or treatment, and that the Practitioner or I may terminate the telemedicine visit at any time. I understand that I have the right to inspect all information obtained and/or recorded in the course of the telemedicine visit and may receive copies of available information for a reasonable fee.  I understand that some of the potential risks of receiving the Services via telemedicine include:   Delay or interruption in medical evaluation due to technological equipment failure or disruption;  Information transmitted may not be sufficient (e.g. poor resolution of images) to allow for appropriate medical decision making by the  Practitioner; and/or   In rare instances, security protocols could fail, causing a breach of personal health information.  Furthermore, I acknowledge that it is my responsibility to provide information about my medical history, conditions and care that is complete and accurate to the best of my ability. I acknowledge that Practitioner's advice, recommendations, and/or decision may be based on factors not within their control, such as incomplete or inaccurate data provided by me or distortions of diagnostic images or specimens that may result from electronic transmissions. I understand that the practice of medicine is not an exact science and that Practitioner makes no warranties or guarantees regarding treatment outcomes. I acknowledge that a copy of this consent can be made available to me via my patient portal [facility] MyChart), or I can request a printed copy by calling the office of [facility].    I understand that my insurance will be billed for this visit.   I have read or had this consent read to me.  I understand the contents of this consent, which adequately explains the benefits and risks of the Services being provided via telemedicine.   I have been provided ample opportunity to ask questions regarding this consent and the Services and have had my questions answered to my satisfaction.  I give my informed consent for the services to be provided through the use of telemedicine in my medical care

## 2020-02-16 ENCOUNTER — Other Ambulatory Visit: Payer: Self-pay | Admitting: Internal Medicine

## 2020-02-16 DIAGNOSIS — K5900 Constipation, unspecified: Secondary | ICD-10-CM

## 2020-02-23 ENCOUNTER — Ambulatory Visit: Payer: Medicare Other | Admitting: Gastroenterology

## 2020-02-27 ENCOUNTER — Encounter (HOSPITAL_COMMUNITY): Payer: Self-pay | Admitting: Emergency Medicine

## 2020-02-27 ENCOUNTER — Emergency Department (HOSPITAL_COMMUNITY)
Admission: EM | Admit: 2020-02-27 | Discharge: 2020-02-28 | Disposition: A | Discharge status: Home / Self Care | Payer: Medicare Other | Attending: Emergency Medicine | Admitting: Emergency Medicine

## 2020-02-27 ENCOUNTER — Other Ambulatory Visit: Payer: Self-pay

## 2020-02-27 ENCOUNTER — Emergency Department (HOSPITAL_COMMUNITY): Payer: Medicare Other

## 2020-02-27 DIAGNOSIS — I10 Essential (primary) hypertension: Secondary | ICD-10-CM | POA: Insufficient documentation

## 2020-02-27 DIAGNOSIS — Z7982 Long term (current) use of aspirin: Secondary | ICD-10-CM | POA: Diagnosis not present

## 2020-02-27 DIAGNOSIS — N3000 Acute cystitis without hematuria: Secondary | ICD-10-CM | POA: Diagnosis not present

## 2020-02-27 DIAGNOSIS — E162 Hypoglycemia, unspecified: Secondary | ICD-10-CM | POA: Diagnosis present

## 2020-02-27 DIAGNOSIS — F17290 Nicotine dependence, other tobacco product, uncomplicated: Secondary | ICD-10-CM | POA: Diagnosis not present

## 2020-02-27 DIAGNOSIS — Z79899 Other long term (current) drug therapy: Secondary | ICD-10-CM | POA: Diagnosis not present

## 2020-02-27 DIAGNOSIS — Z20822 Contact with and (suspected) exposure to covid-19: Secondary | ICD-10-CM | POA: Diagnosis not present

## 2020-02-27 DIAGNOSIS — I2510 Atherosclerotic heart disease of native coronary artery without angina pectoris: Secondary | ICD-10-CM | POA: Insufficient documentation

## 2020-02-27 DIAGNOSIS — E11649 Type 2 diabetes mellitus with hypoglycemia without coma: Secondary | ICD-10-CM | POA: Diagnosis not present

## 2020-02-27 DIAGNOSIS — Z794 Long term (current) use of insulin: Secondary | ICD-10-CM | POA: Diagnosis not present

## 2020-02-27 LAB — URINALYSIS, ROUTINE W REFLEX MICROSCOPIC
Bilirubin Urine: NEGATIVE
Glucose, UA: NEGATIVE mg/dL
Hgb urine dipstick: NEGATIVE
Ketones, ur: NEGATIVE mg/dL
Nitrite: POSITIVE — AB
Protein, ur: NEGATIVE mg/dL
Specific Gravity, Urine: 1.01 (ref 1.005–1.030)
WBC, UA: >50 WBC/hpf — ABNORMAL HIGH (ref 0–5)
pH: 5 (ref 5.0–8.0)

## 2020-02-27 LAB — CBC WITH DIFFERENTIAL/PLATELET
Abs Immature Granulocytes: 0.02 10*3/uL (ref 0.00–0.07)
Basophils Absolute: 0 10*3/uL (ref 0.0–0.1)
Basophils Relative: 0 %
Eosinophils Absolute: 0 10*3/uL (ref 0.0–0.5)
Eosinophils Relative: 0 %
HCT: 35.6 % — ABNORMAL LOW (ref 39.0–52.0)
Hemoglobin: 11.7 g/dL — ABNORMAL LOW (ref 13.0–17.0)
Immature Granulocytes: 0 %
Lymphocytes Relative: 29 %
Lymphs Abs: 2.1 10*3/uL (ref 0.7–4.0)
MCH: 29.7 pg (ref 26.0–34.0)
MCHC: 32.9 g/dL (ref 30.0–36.0)
MCV: 90.4 fL (ref 80.0–100.0)
Monocytes Absolute: 0.4 10*3/uL (ref 0.1–1.0)
Monocytes Relative: 6 %
Neutro Abs: 4.7 10*3/uL (ref 1.7–7.7)
Neutrophils Relative %: 65 %
Platelets: 116 10*3/uL — ABNORMAL LOW (ref 150–400)
RBC: 3.94 MIL/uL — ABNORMAL LOW (ref 4.22–5.81)
RDW: 15.9 % — ABNORMAL HIGH (ref 11.5–15.5)
WBC: 7.3 10*3/uL (ref 4.0–10.5)
nRBC: 0 % (ref 0.0–0.2)

## 2020-02-27 LAB — COMPREHENSIVE METABOLIC PANEL
ALT: 30 U/L (ref 0–44)
AST: 24 U/L (ref 15–41)
Albumin: 3.4 g/dL — ABNORMAL LOW (ref 3.5–5.0)
Alkaline Phosphatase: 76 U/L (ref 38–126)
Anion gap: 4 — ABNORMAL LOW (ref 5–15)
BUN: 20 mg/dL (ref 6–20)
CO2: 23 mmol/L (ref 22–32)
Calcium: 8.2 mg/dL — ABNORMAL LOW (ref 8.9–10.3)
Chloride: 112 mmol/L — ABNORMAL HIGH (ref 98–111)
Creatinine, Ser: 1.04 mg/dL (ref 0.61–1.24)
GFR, Estimated: >60 mL/min (ref >60)
Glucose, Bld: 67 mg/dL — ABNORMAL LOW (ref 70–99)
Potassium: 4.1 mmol/L (ref 3.5–5.1)
Sodium: 139 mmol/L (ref 135–145)
Total Bilirubin: 0.4 mg/dL (ref 0.3–1.2)
Total Protein: 6 g/dL — ABNORMAL LOW (ref 6.5–8.1)

## 2020-02-27 LAB — CBG MONITORING, ED
Glucose-Capillary: 85 mg/dL (ref 70–99)
Glucose-Capillary: 50 mg/dL — ABNORMAL LOW (ref 70–99)

## 2020-02-27 LAB — TROPONIN I (HIGH SENSITIVITY): Troponin I (High Sensitivity): 5 ng/L (ref <18)

## 2020-02-27 MED ORDER — GLUCOSE 40 % PO GEL
1.0000 | Freq: Once | ORAL | Status: AC
  Administered 2020-02-27: 37.5 g via ORAL
  Filled 2020-02-27: qty 1

## 2020-02-27 MED ORDER — SODIUM CHLORIDE 0.9 % IV BOLUS
1000.0000 mL | Freq: Once | INTRAVENOUS | Status: AC
  Administered 2020-02-27: 1000 mL via INTRAVENOUS

## 2020-02-27 NOTE — ED Notes (Signed)
Blood drawn via 23 gauge butterfly from R hand 

## 2020-02-27 NOTE — ED Provider Notes (Incomplete)
[facility] EMERGENCY DEPARTMENT Provider Note   CSN: [id number] Arrival date & time: 02/27/20  2036     History Chief Complaint  Patient presents with  . Hypotension    Oscar Elliott is a 60 y.o. male.   [facility] care   HPI     Past Medical History:  Diagnosis Date  . CAD (coronary artery disease)    a. 10/2013 Lateral STEMI/PCI: LM nl, LAD min irregs, D1 occluded sub branch, LCX min irregs mid, OM large/patent, RCA 157m (2.75x28 Promus DES).  . Cholelithiasis   . Essential hypertension   . History of renal failure    a. 06/2007: in setting of rhabdo post L AKA.  Marland Kitchen Hyperlipidemia   . MI (myocardial infarction) (HCC)   . Nephrolithiasis   . Peripheral vascular disease (HCC)    a. 06/2007: s/p L AKA @ [facility]  . Schizophrenia (HCC)   . Type 2 diabetes mellitus [facility])     Patient Active Problem List   Diagnosis Date Noted  . Constipation 06/16/2019  . Polypharmacy 03/21/2018  . Hypoglycemia 05/30/2016  . Type 2 diabetes mellitus with circulatory disorder (HCC) 10/25/2015  . Encounter for screening colonoscopy 04/21/2014  . Cholelithiases 04/21/2014  . Hemorrhoids 04/21/2014  . Elevated troponin 02/24/2014  . Nausea and vomiting 02/23/2014  . Abdominal pain   . C. difficile colitis   . Hypokalemia   . Vomiting and diarrhea 02/22/2014  . CAD -S/P RCA DES Sept 2015 12/02/2013  . Hyperlipidemia 12/02/2013  . Tobacco abuse   . Peripheral vascular disease (HCC)   . Schizophrenia (HCC)   . Essential hypertension     Past Surgical History:  Procedure Laterality Date  . Above-the-knee amputation Left   . COLONOSCOPY WITH PROPOFOL N/A 12/09/2018   fields: nonthrombosed hemorrhoids, decreased anal sphincter tone. poor prep, colonoscopy aborted  . FLEXIBLE SIGMOIDOSCOPY  11/17/2019   Procedure: FLEXIBLE SIGMOIDOSCOPY;  Surgeon: Lanelle Bal, DO;  Location: [facility] SUITE;  Service: Endoscopy;;  . LEFT HEART CATHETERIZATION WITH CORONARY ANGIOGRAM N/A  11/20/2013   Procedure: LEFT HEART CATHETERIZATION WITH CORONARY ANGIOGRAM;  Surgeon: Corky Crafts, MD;  Location: [facility] CATH LAB;  Service: Cardiovascular;  Laterality: N/A;       Family History  Problem Relation Age of Onset  . Other Other        no premature CAD.    Social History   Tobacco Use  . Smoking status: Current Every Day Smoker    Packs/day: 0.25    Years: 20.00    Pack years: 5.00    Types: Cigars  . Smokeless tobacco: Never Used  . Tobacco comment: 1-2 cig daily  Vaping Use  . Vaping Use: Never used  Substance Use Topics  . Alcohol use: No    Alcohol/week: 0.0 standard drinks  . Drug use: No    Home Medications Prior to Admission medications   Medication Sig Start Date End Date Taking? Authorizing Provider  acetaminophen (TYLENOL) 500 MG tablet Take 500 mg by mouth every 4 (four) hours as needed for moderate pain.    [provider]  ARIPiprazole (ABILIFY) 15 MG tablet Take 1 tablet (15 mg total) by mouth daily. 03/26/19   Oscar Amos, MD  aspirin EC 81 MG tablet Take 81 mg by mouth daily.    [provider]  atorvastatin (LIPITOR) 80 MG tablet Take 1 tablet (80 mg total) by mouth daily at 6 PM. 05/31/16   Erick Blinks, MD  Cholecalciferol (VITAMIN D)  50 MCG (2000 UT) CAPS Take 2,000 Units by mouth once a week.     [provider]  clonazePAM (KLONOPIN) 0.5 MG tablet Take 1 tablet (0.5 mg total) by mouth at bedtime. Patient taking differently: Take 0.5 mg by mouth every evening.  09/17/19   Oscar Clay, MD  cloZAPine (CLOZARIL) 100 MG tablet 100 mg in AM and 500 mg at night Patient taking differently: Take 100-500 mg by mouth See admin instructions. Take 100 mg by mouth in morning and 500 mg at night 03/26/19   Oscar Crane, MD  diclofenac Sodium (VOLTAREN) 1 % GEL Apply 2 g topically 4 (four) times daily as needed (pain).     [provider]  docusate sodium (COLACE) 100 MG capsule Take 100 mg by mouth 2 (two) times  daily.    [provider]  DULoxetine (CYMBALTA) 60 MG capsule Take 2 capsules (120 mg total) by mouth daily. 08/20/19   Oscar Clay, MD  famotidine (PEPCID) 20 MG tablet Take 20 mg by mouth daily as needed for heartburn or indigestion.    [provider]  gabapentin (NEURONTIN) 100 MG capsule Take 100 mg by mouth 2 (two) times daily.    [provider]  hydrOXYzine (ATARAX/VISTARIL) 25 MG tablet Take 25 mg by mouth 2 (two) times daily as needed for anxiety or itching.    [provider]  insulin aspart (NOVOLOG) 100 UNIT/ML injection Inject 2 Units into the skin 3 (three) times daily before meals. Patient taking differently: Inject 2 Units into the skin 3 (three) times daily as needed (blood sugar greater than 200 and resident eats a meal). Hold if resident does not eat 06/01/16   Oscar Dike, MD  insulin detemir (LEVEMIR) 100 UNIT/ML injection Inject 0.3 mLs (30 Units total) into the skin daily. Patient taking differently: Inject 35 Units into the skin daily. Hold if pt does not eat . 05/31/16   Oscar Dike, MD  ketoconazole (NIZORAL) 2 % shampoo Apply 1 application topically 2 (two) times a week.    [provider]  latanoprost (XALATAN) 0.005 % ophthalmic solution Place 1 drop into both eyes at bedtime.    [provider]  lisinopril (ZESTRIL) 10 MG tablet Take 10 mg by mouth daily. Hold if systolic is less than 154    [provider]  metoprolol (LOPRESSOR) 50 MG tablet Take 1 tablet (50 mg total) by mouth 2 (two) times daily. 12/02/13   Imogene Burn, PA-C  nitroGLYCERIN (NITROSTAT) 0.4 MG SL tablet Place 1 tablet (0.4 mg total) under the tongue every 5 (five) minutes x 3 doses as needed for chest pain. 11/23/13   Barrett, Evelene Croon, PA-C  omeprazole (PRILOSEC) 40 MG capsule Take 40 mg by mouth 2 (two) times daily.     [provider]  Plecanatide (TRULANCE) 3 MG TABS Take 3 mg by mouth daily. 09/11/19   Oscar Stable, NP   polyethylene glycol (MIRALAX / GLYCOLAX) 17 g packet Take 17 g by mouth daily as needed for moderate constipation.    [provider]  polyethylene glycol-electrolytes (NULYTELY) 420 g solution As directed 06/16/19   Rourk, Cristopher Estimable, MD  tamsulosin (FLOMAX) 0.4 MG CAPS capsule Take 0.4 mg by mouth daily.    [provider]    Allergies    Penicillins  Review of Systems   Review of Systems  Physical Exam Updated Vital Signs BP 100/64 (BP Location: Right Arm)   Pulse 87   Temp  98.6 F (37 C) (Oral)   Resp 16   Ht 5\' 1"  (1.549 m)   Wt 64.9 kg   SpO2 96%   BMI 27.03 kg/m   Physical Exam  ED Results / Procedures / Treatments   Labs (all labs ordered are listed, but only abnormal results are displayed) Labs Reviewed - No data to display  EKG None  Radiology No results found.  Procedures Procedures (including critical care time)  Medications Ordered in ED Medications - No data to display  ED Course  I have reviewed the triage vital signs and the nursing notes.  Pertinent labs & imaging results that were available during my care of the patient were reviewed by me and considered in my medical decision making (see chart for details).    MDM Rules/Calculators/A&P                          *** Final Clinical Impression(s) / ED Diagnoses Final diagnoses:  None    Rx / DC Orders ED Discharge Orders    None

## 2020-02-27 NOTE — ED Triage Notes (Signed)
Pt arrives from [facility] home for hypotension and hyperglycemia. Facility gave pt nitro prior to EMS arrival. Pts CBG has been elevated today. Pt given 1L NS by EMS.

## 2020-02-27 NOTE — ED Provider Notes (Signed)
[facility] EMERGENCY DEPARTMENT Provider Note   CSN: [id number] Arrival date & time: 02/27/20  2036     History Chief Complaint  Patient presents with  . Hypotension    Oscar Elliott is a 60 y.o. male brought in by EMS from [facility] for evaluation of high blood sugar and hypotension.  Patient states that he thinks "they wanted to get my blood checked out."  He states that earlier, he was having some mild chest pain.  He states that now it has resolved.  He states he does not currently have any complaints except for that he is hungry.  He is unsure of exactly why they sent him here.  He denies any chest pain, difficulty breathing, abdominal pain, nausea/vomiting.  Patient is a difficult historian.   The history is provided by the patient and the EMS personnel.       Past Medical History:  Diagnosis Date  . CAD (coronary artery disease)    a. 10/2013 Lateral STEMI/PCI: LM nl, LAD min irregs, D1 occluded sub branch, LCX min irregs mid, OM large/patent, RCA 167m (2.75x28 Promus DES).  . Cholelithiasis   . Essential hypertension   . History of renal failure    a. 06/2007: in setting of rhabdo post L AKA.  Marland Kitchen Hyperlipidemia   . MI (myocardial infarction) (HCC)   . Nephrolithiasis   . Peripheral vascular disease (HCC)    a. 06/2007: s/p L AKA @ [facility]  . Schizophrenia (HCC)   . Type 2 diabetes mellitus [facility])     Patient Active Problem List   Diagnosis Date Noted  . Constipation 06/16/2019  . Polypharmacy 03/21/2018  . Hypoglycemia 05/30/2016  . Type 2 diabetes mellitus with circulatory disorder (HCC) 10/25/2015  . Encounter for screening colonoscopy 04/21/2014  . Cholelithiases 04/21/2014  . Hemorrhoids 04/21/2014  . Elevated troponin 02/24/2014  . Nausea and vomiting 02/23/2014  . Abdominal pain   . C. difficile colitis   . Hypokalemia   . Vomiting and diarrhea 02/22/2014  . CAD -S/P RCA DES Sept 2015 12/02/2013  . Hyperlipidemia 12/02/2013  . Tobacco abuse   .  Peripheral vascular disease (HCC)   . Schizophrenia (HCC)   . Essential hypertension     Past Surgical History:  Procedure Laterality Date  . Above-the-knee amputation Left   . COLONOSCOPY WITH PROPOFOL N/A 12/09/2018   fields: nonthrombosed hemorrhoids, decreased anal sphincter tone. poor prep, colonoscopy aborted  . FLEXIBLE SIGMOIDOSCOPY  11/17/2019   Procedure: FLEXIBLE SIGMOIDOSCOPY;  Surgeon: Lanelle Bal, DO;  [facility];  Service: Endoscopy;;  . LEFT HEART CATHETERIZATION WITH CORONARY ANGIOGRAM N/A 11/20/2013   Procedure: LEFT HEART CATHETERIZATION WITH CORONARY ANGIOGRAM;  Surgeon: Corky Crafts, MD;  Location: [facility] CATH LAB;  Service: Cardiovascular;  Laterality: N/A;       Family History  Problem Relation Age of Onset  . Other Other        no premature CAD.    Social History   Tobacco Use  . Smoking status: Current Every Day Smoker    Packs/day: 0.25    Years: 20.00    Pack years: 5.00    Types: Cigars  . Smokeless tobacco: Never Used  . Tobacco comment: 1-2 cig daily  Vaping Use  . Vaping Use: Never used  Substance Use Topics  . Alcohol use: No    Alcohol/week: 0.0 standard drinks  . Drug use: No    Home Medications Prior to Admission medications   Medication  Sig Start Date End Date Taking? Authorizing Provider  acetaminophen (TYLENOL) 500 MG tablet Take 500 mg by mouth every 4 (four) hours as needed for moderate pain.    [provider]  ARIPiprazole (ABILIFY) 15 MG tablet Take 1 tablet (15 mg total) by mouth daily. 03/26/19   Zena Amos, MD  aspirin EC 81 MG tablet Take 81 mg by mouth daily.    [provider]  atorvastatin (LIPITOR) 80 MG tablet Take 1 tablet (80 mg total) by mouth daily at 6 PM. 05/31/16   Erick Blinks, MD  Cholecalciferol (VITAMIN D) 50 MCG (2000 UT) CAPS Take 2,000 Units by mouth once a week.     [provider]  clonazePAM (KLONOPIN) 0.5 MG tablet Take 1 tablet (0.5 mg total) by  mouth at bedtime. Patient taking differently: Take 0.5 mg by mouth every evening.  09/17/19   Neysa Hotter, MD  cloZAPine (CLOZARIL) 100 MG tablet 100 mg in AM and 500 mg at night Patient taking differently: Take 100-500 mg by mouth See admin instructions. Take 100 mg by mouth in morning and 500 mg at night 03/26/19   Zena Amos, MD  diclofenac Sodium (VOLTAREN) 1 % GEL Apply 2 g topically 4 (four) times daily as needed (pain).     [provider]  docusate sodium (COLACE) 100 MG capsule Take 100 mg by mouth 2 (two) times daily.    [provider]  DULoxetine (CYMBALTA) 60 MG capsule Take 2 capsules (120 mg total) by mouth daily. 08/20/19   Neysa Hotter, MD  famotidine (PEPCID) 20 MG tablet Take 20 mg by mouth daily as needed for heartburn or indigestion.    [provider]  gabapentin (NEURONTIN) 100 MG capsule Take 100 mg by mouth 2 (two) times daily.    [provider]  hydrOXYzine (ATARAX/VISTARIL) 25 MG tablet Take 25 mg by mouth 2 (two) times daily as needed for anxiety or itching.    [provider]  insulin aspart (NOVOLOG) 100 UNIT/ML injection Inject 2 Units into the skin 3 (three) times daily before meals. Patient taking differently: Inject 2 Units into the skin 3 (three) times daily as needed (blood sugar greater than 200 and resident eats a meal). Hold if resident does not eat 06/01/16   Erick Blinks, MD  insulin detemir (LEVEMIR) 100 UNIT/ML injection Inject 0.3 mLs (30 Units total) into the skin daily. Patient taking differently: Inject 35 Units into the skin daily. Hold if pt does not eat . 05/31/16   Erick Blinks, MD  ketoconazole (NIZORAL) 2 % shampoo Apply 1 application topically 2 (two) times a week.    [provider]  latanoprost (XALATAN) 0.005 % ophthalmic solution Place 1 drop into both eyes at bedtime.    [provider]  lisinopril (ZESTRIL) 10 MG tablet Take 10 mg by mouth daily. Hold if systolic is less  than 100    [provider]  metoprolol (LOPRESSOR) 50 MG tablet Take 1 tablet (50 mg total) by mouth 2 (two) times daily. 12/02/13   Dyann Kief, PA-C  nitroGLYCERIN (NITROSTAT) 0.4 MG SL tablet Place 1 tablet (0.4 mg total) under the tongue every 5 (five) minutes x 3 doses as needed for chest pain. 11/23/13   Barrett, Joline Salt, PA-C  omeprazole (PRILOSEC) 40 MG capsule Take 40 mg by mouth 2 (two) times daily.     [provider]  Plecanatide (TRULANCE) 3 MG TABS Take 3 mg by mouth daily. 09/11/19  Walden Field A, NP  polyethylene glycol (MIRALAX / GLYCOLAX) 17 g packet Take 17 g by mouth daily as needed for moderate constipation.    [provider]  polyethylene glycol-electrolytes (NULYTELY) 420 g solution As directed 06/16/19   Rourk, Cristopher Estimable, MD  tamsulosin (FLOMAX) 0.4 MG CAPS capsule Take 0.4 mg by mouth daily.    [provider]    Allergies    Penicillins  Review of Systems   Review of Systems  Constitutional: Negative for fever.  Respiratory: Negative for cough and shortness of breath.   Cardiovascular: Positive for chest pain.  Gastrointestinal: Negative for abdominal pain, nausea and vomiting.  Genitourinary: Negative for dysuria and hematuria.  Neurological: Negative for headaches.  All other systems reviewed and are negative.   Physical Exam Updated Vital Signs BP 120/78   Pulse 78   Temp 98.3 F (36.8 C) (Oral)   Resp (!) 22   Ht 5\' 1"  (1.549 m)   Wt 64.9 kg   SpO2 100%   BMI 27.03 kg/m   Physical Exam Vitals and nursing note reviewed.  Constitutional:      Appearance: Normal appearance. He is well-developed and well-nourished.     Comments: Well appearing, NAD   HENT:     Head: Normocephalic and atraumatic.     Mouth/Throat:     Mouth: Oropharynx is clear and moist and mucous membranes are normal.  Eyes:     General: Lids are normal.     Extraocular Movements: EOM normal.     Conjunctiva/sclera: Conjunctivae normal.      Pupils: Pupils are equal, round, and reactive to light.  Cardiovascular:     Rate and Rhythm: Normal rate and regular rhythm.     Pulses: Normal pulses.          Radial pulses are 2+ on the right side and 2+ on the left side.       Dorsalis pedis pulses are 2+ on the right side.     Heart sounds: Normal heart sounds. No murmur heard. No friction rub. No gallop.   Pulmonary:     Effort: Pulmonary effort is normal.     Breath sounds: Normal breath sounds.  Abdominal:     Palpations: Abdomen is soft. Abdomen is not rigid.     Tenderness: There is no abdominal tenderness. There is no guarding.     Comments: Abdomen is soft, non-distended, non-tender. No rigidity, No guarding. No peritoneal signs.  Musculoskeletal:        General: Normal range of motion.     Cervical back: Full passive range of motion without pain.     Comments: Left BKA  Skin:    General: Skin is warm and dry.     Capillary Refill: Capillary refill takes less than 2 seconds.  Neurological:     Mental Status: He is alert.     Comments: Follows commands MAE Answers some questions.  He will tell me his name and where he is at.  When I asked him what year he shrugs and says "I don't know." 5/5 strength BUE 5/5 strength RLE  Psychiatric:        Mood and Affect: Mood and affect normal.        Speech: Speech normal.     ED Results / Procedures / Treatments   Labs (all labs ordered are listed, but only abnormal results are displayed) Labs Reviewed  COMPREHENSIVE METABOLIC PANEL - Abnormal; Notable for the following components:  Result Value   Chloride 112 (*)    Glucose, Bld 67 (*)    Calcium 8.2 (*)    Total Protein 6.0 (*)    Albumin 3.4 (*)    Anion gap 4 (*)    All other components within normal limits  CBC WITH DIFFERENTIAL/PLATELET - Abnormal; Notable for the following components:   RBC 3.94 (*)    Hemoglobin 11.7 (*)    HCT 35.6 (*)    RDW 15.9 (*)    Platelets 116 (*)    All other components  within normal limits  URINALYSIS, ROUTINE W REFLEX MICROSCOPIC - Abnormal; Notable for the following components:   APPearance HAZY (*)    Nitrite POSITIVE (*)    Leukocytes,Ua SMALL (*)    WBC, UA >50 (*)    Bacteria, UA MANY (*)    All other components within normal limits  CBG MONITORING, ED - Abnormal; Notable for the following components:   Glucose-Capillary 50 (*)    All other components within normal limits  RESP PANEL BY RT-PCR (FLU A&B, COVID) [id number]  CBG MONITORING, ED  TROPONIN I (HIGH SENSITIVITY)  TROPONIN I (HIGH SENSITIVITY)    EKG EKG Interpretation  Date/Time:  Saturday February 27 2020 20:41:34 EST Ventricular Rate:  85 PR Interval:    QRS Duration: 78 QT Interval:  371 QTC Calculation: 442 R Axis:   68 Text Interpretation: Sinus rhythm Low voltage with right axis deviation Consider anterior infarct Confirmed by Kennis Carina [phone]) on 02/27/2020 10:59:37 PM   Radiology DG Chest 2 View  Result Date: 02/27/2020 CLINICAL DATA:  Chest pain EXAM: CHEST - 2 VIEW COMPARISON:  None. FINDINGS: The heart size and mediastinal contours are within normal limits. Both lungs are clear. The visualized skeletal structures are unremarkable. IMPRESSION: No active cardiopulmonary disease. Electronically Signed   By: Jonna Clark M.D.   On: 02/27/2020 22:11    Procedures Procedures (including critical care time)  Medications Ordered in ED Medications  dextrose (GLUTOSE) 40 % oral gel 37.5 g (37.5 g Oral Given 02/27/20 2329)  sodium chloride 0.9 % bolus 1,000 mL (1,000 mLs Intravenous New Bag/Given 02/27/20 2326)    ED Course  I have reviewed the triage vital signs and the nursing notes.  Pertinent labs & imaging results that were available during my care of the patient were reviewed by me and considered in my medical decision making (see chart for details).    MDM Rules/Calculators/A&P                          60 year old male possible history of hyperlipidemia, renal  failure, MI, diabetes who presents for evaluation.  Patient is unsure why he is here.  He states that "they wanted me to get my blood checked out."  He states that he had some chest pain earlier but states that is now resolved.  He states the only thing bothering him is right now as he is hungry.  He denies any other complaints.  Abdominal exam is benign.  He is otherwise well-appearing.  Plan to check labs, chest x-ray, EKG.  Will obtain troponin given his history.  I attempted to call [facility] x[facility] but did not get in contact with anybody.  Patient hypoglycemic.  He was provided food.  Initial troponin is negative.  CBC shows no leukocytosis.  Hemoglobin stable at 11.7.  CMP shows BUN of 20, creatinine 1.04.  Chest x-ray is unremarkable.  Reevaluation.  Patient resting comfortably.  He is getting fluids.  He is eating without any difficulty.  Patient signed out to Dr. Bebe Shaggy pending urine, troponin.  Portions of this note were generated with Scientist, clinical (histocompatibility and immunogenetics). Dictation errors may occur despite best attempts at proofreading.   Final Clinical Impression(s) / ED Diagnoses Final diagnoses:  Hypoglycemia  Acute cystitis without hematuria    Rx / DC Orders ED Discharge Orders    None       Rosana Hoes 02/29/20 2207    Sabas Sous, MD 03/02/20 [phone]

## 2020-02-28 DIAGNOSIS — E11649 Type 2 diabetes mellitus with hypoglycemia without coma: Secondary | ICD-10-CM | POA: Diagnosis not present

## 2020-02-28 LAB — CBG MONITORING, ED
Glucose-Capillary: 129 mg/dL — ABNORMAL HIGH (ref 70–99)
Glucose-Capillary: 288 mg/dL — ABNORMAL HIGH (ref 70–99)

## 2020-02-28 LAB — RESP PANEL BY RT-PCR (FLU A&B, COVID) ARPGX2
Influenza A by PCR: NEGATIVE
Influenza B by PCR: NEGATIVE
SARS Coronavirus 2 by RT PCR: NEGATIVE

## 2020-02-28 LAB — TROPONIN I (HIGH SENSITIVITY): Troponin I (High Sensitivity): 4 ng/L (ref <18)

## 2020-02-28 MED ORDER — FOSFOMYCIN TROMETHAMINE 3 G PO PACK
3.0000 g | PACK | Freq: Once | ORAL | Status: AC
  Administered 2020-02-28: 3 g via ORAL
  Filled 2020-02-28: qty 3

## 2020-02-28 NOTE — Discharge Instructions (Signed)
You were given a one-time dose of an antibiotic.  This will treat your urinary tract infection. Be sure to check the blood glucose very frequently to make sure does not drop

## 2020-02-28 NOTE — ED Provider Notes (Signed)
Patient is improved in no acute distress.  Denies any chest pain. Glucose is improved Repeat troponin unremarkable UTI was noted, fosfomycin ordered He will be discharged back to group home   Zadie Rhine, MD 02/28/20 [phone]

## 2020-02-28 NOTE — ED Notes (Signed)
Spoke to Production designer, theatre/television/film for group home facility Oscar Elliott.  Notified him that patient is being discharged from the ED.  He states he will coordinate to have someone come to the ED to pick up patient.

## 2020-02-28 NOTE — ED Notes (Signed)
Patient discharged to group home.  All discharge instructions reviewed.  Patient able to verbalize understanding via teachback method.  Wheelchair out of ED.

## 2020-03-09 NOTE — Progress Notes (Signed)
Virtual Visit via Telephone Note   This visit type was conducted due to national recommendations for restrictions regarding the COVID-19 Pandemic (e.g. social distancing) in an effort to limit this patient's exposure and mitigate transmission in our community.  Due to his co-morbid illnesses, this patient is at least at moderate risk for complications without adequate follow up.  This format is felt to be most appropriate for this patient at this time.  The patient did not have access to video technology/had technical difficulties with video requiring transitioning to audio format only (telephone).  All issues noted in this document were discussed and addressed.  No physical exam could be performed with this format.  Please refer to the patient's chart for his  consent to telehealth for [facility].    Date:  03/10/2020   ID:  Oscar Elliott, DOB 01-Nov-1960, MRN [id number] The patient was identified using 2 identifiers.  Patient Location: Home Provider Location: Office/Clinic  PCP:  Anselm Jungling, NP  Cardiologist:  Nona Dell, MD  Electrophysiologist:  None   Evaluation Performed:  Follow-Up Visit  Chief Complaint:  Cardiac follow up  History of Present Illness:    Oscar Elliott is a 60 y.o. male with CAD/ MI , HTN, HLD, PVD, DM2.  Previous history of DES to RCA 2015 being managed medically.  Last encounter with Dr. Diona Browner via telemedicine 01/05/2019.  He was residing at [facility].  Did not report any anginal symptoms or nitroglycerin use.  Indicated NYHA class II dyspnea based on description of activity.  He was continuing Lipitor, Plavix, lisinopril, Lopressor and as needed nitroglycerin.  Interval lab work was requested.  Blood pressure systolic was in the 120s during that visit.  No changes to current regimen.  The patient does not have symptoms concerning for COVID-19 infection (fever, chills, cough, or new shortness of breath).    Spoke to  patient by phone today as well as his relative who was in the room with him.  He denies any recent anginal symptoms or nitroglycerin use.  Blood pressure is well controlled.  He continues on aspirin, nitroglycerin sublingual, metoprolol 50 mg p.o. twice daily,  Lisinopril 10 mg daily.  Patient states he recently had a UTI and mild chest pain with high blood sugar of 288 with emergency room visit on 02/27/2020 and was treated.  Ruled out for ACS.  He was COVID-negative.  Otherwise he denies any issues other than phantom limb pain from previous AKA.  Blood pressures reasonably well controlled today with blood pressure 125/84.  States he has received the COVID vaccines.   Past Medical History:  Diagnosis Date  . CAD (coronary artery disease)    a. 10/2013 Lateral STEMI/PCI: LM nl, LAD min irregs, D1 occluded sub branch, LCX min irregs mid, OM large/patent, RCA 179m (2.75x28 Promus DES).  . Cholelithiasis   . Essential hypertension   . History of renal failure    a. 06/2007: in setting of rhabdo post L AKA.  Marland Kitchen Hyperlipidemia   . MI (myocardial infarction) (HCC)   . Nephrolithiasis   . Peripheral vascular disease (HCC)    a. 06/2007: s/p L AKA @ [facility]  . Schizophrenia (HCC)   . Type 2 diabetes mellitus (HCC)    Past Surgical History:  Procedure Laterality Date  . Above-the-knee amputation Left   . COLONOSCOPY WITH PROPOFOL N/A 12/09/2018   fields: nonthrombosed hemorrhoids, decreased anal sphincter tone. poor prep, colonoscopy aborted  . FLEXIBLE SIGMOIDOSCOPY  11/17/2019  Procedure: FLEXIBLE SIGMOIDOSCOPY;  Surgeon: Lanelle Bal, DO;  [facility];  Service: Endoscopy;;  . LEFT HEART CATHETERIZATION WITH CORONARY ANGIOGRAM N/A 11/20/2013   Procedure: LEFT HEART CATHETERIZATION WITH CORONARY ANGIOGRAM;  Surgeon: Corky Crafts, MD;  Location: [facility] CATH LAB;  Service: Cardiovascular;  Laterality: N/A;     Current Meds  Medication Sig  . acetaminophen (TYLENOL) 500 MG tablet Take  500 mg by mouth every 4 (four) hours as needed for moderate pain.  . ARIPiprazole (ABILIFY) 15 MG tablet Take 1 tablet (15 mg total) by mouth daily.  Marland Kitchen aspirin EC 81 MG tablet Take 81 mg by mouth daily.  Marland Kitchen atorvastatin (LIPITOR) 80 MG tablet Take 1 tablet (80 mg total) by mouth daily at 6 PM.  . Cholecalciferol (VITAMIN D) 50 MCG (2000 UT) CAPS Take 2,000 Units by mouth once a week.   . clonazePAM (KLONOPIN) 0.5 MG tablet Take 1 tablet (0.5 mg total) by mouth at bedtime. (Patient taking differently: Take 0.5 mg by mouth every evening.)  . cloZAPine (CLOZARIL) 100 MG tablet 100 mg in AM and 500 mg at night (Patient taking differently: Take 100-500 mg by mouth See admin instructions. Take 100 mg by mouth in morning and 500 mg at night)  . diclofenac Sodium (VOLTAREN) 1 % GEL Apply 2 g topically 4 (four) times daily as needed (pain).   Marland Kitchen docusate sodium (COLACE) 100 MG capsule Take 100 mg by mouth 2 (two) times daily.  . DULoxetine (CYMBALTA) 60 MG capsule Take 2 capsules (120 mg total) by mouth daily.  . famotidine (PEPCID) 20 MG tablet Take 20 mg by mouth daily as needed for heartburn or indigestion.  . gabapentin (NEURONTIN) 100 MG capsule Take 100 mg by mouth 2 (two) times daily.  . hydrOXYzine (ATARAX/VISTARIL) 25 MG tablet Take 25 mg by mouth 2 (two) times daily as needed for anxiety or itching.  . insulin aspart (NOVOLOG) 100 UNIT/ML injection Inject 2 Units into the skin 3 (three) times daily before meals. (Patient taking differently: Inject 2 Units into the skin 3 (three) times daily as needed (blood sugar greater than 200 and resident eats a meal). Hold if resident does not eat)  . insulin detemir (LEVEMIR) 100 UNIT/ML injection Inject 0.3 mLs (30 Units total) into the skin daily. (Patient taking differently: Inject 35 Units into the skin daily. Hold if pt does not eat .)  . ketoconazole (NIZORAL) 2 % shampoo Apply 1 application topically 2 (two) times a week.  . latanoprost (XALATAN) 0.005 %  ophthalmic solution Place 1 drop into both eyes at bedtime.  Marland Kitchen lisinopril (ZESTRIL) 10 MG tablet Take 10 mg by mouth daily. Hold if systolic is less than 100  . metoprolol (LOPRESSOR) 50 MG tablet Take 1 tablet (50 mg total) by mouth 2 (two) times daily.  . nitroGLYCERIN (NITROSTAT) 0.4 MG SL tablet Place 1 tablet (0.4 mg total) under the tongue every 5 (five) minutes x 3 doses as needed for chest pain.  Marland Kitchen omeprazole (PRILOSEC) 40 MG capsule Take 40 mg by mouth 2 (two) times daily.   Marland Kitchen Plecanatide (TRULANCE) 3 MG TABS Take 3 mg by mouth daily.  . polyethylene glycol (MIRALAX / GLYCOLAX) 17 g packet Take 17 g by mouth daily as needed for moderate constipation.  . polyethylene glycol-electrolytes (NULYTELY) 420 g solution As directed  . tamsulosin (FLOMAX) 0.4 MG CAPS capsule Take 0.4 mg by mouth daily.     Allergies:   Penicillins   Social  History   Tobacco Use  . Smoking status: Current Every Day Smoker    Packs/day: 0.25    Years: 20.00    Pack years: 5.00    Types: Cigars  . Smokeless tobacco: Never Used  . Tobacco comment: 1-2 cig daily  Vaping Use  . Vaping Use: Never used  Substance Use Topics  . Alcohol use: No    Alcohol/week: 0.0 standard drinks  . Drug use: No     Family Hx: The patient's family history includes Other in an other family member.  ROS:   Please see the history of present illness.    All other systems reviewed and are negative.   Prior CV studies:   The following studies were reviewed today:   Echocardiogram 05/31/2016: Study Conclusions  - Left ventricle: The cavity size was normal. Wall thickness was increased in a pattern of mild LVH. Systolic function was at the lower limits of normal. The estimated ejection fraction was 50%. Doppler parameters are consistent with abnormal left ventricular relaxation (grade 1 diastolic dysfunction). - Regional wall motion abnormality: Mild hypokinesis of the mid inferoseptal and basal-mid  inferior myocardium  Labs/Other Tests and Data Reviewed:    EKG:  EKG February 27, 2020 sinus rhythm rate of 85, consider anterior infarct, low voltage with right axis deviation  Recent Labs: 02/27/2020: ALT 30; BUN 20; Creatinine, Ser 1.04; Hemoglobin 11.7; Platelets 116; Potassium 4.1; Sodium 139   Recent Lipid Panel Lab Results  Component Value Date/Time   CHOL 102 11/21/2013 01:23 AM   TRIG 66 11/21/2013 01:23 AM   HDL 27 (L) 11/21/2013 01:23 AM   CHOLHDL 3.8 11/21/2013 01:23 AM   LDLCALC 62 11/21/2013 01:23 AM    Wt Readings from Last 3 Encounters:  03/10/20 139 lb 9.6 oz (63.3 kg)  02/27/20 143 lb 1.3 oz (64.9 kg)  06/16/19 143 lb (64.9 kg)     Risk Assessment/Calculations:      Objective:    Vital Signs:  BP 125/84   Pulse 87   Ht 5\' 3"  (1.6 m)   Wt 139 lb 9.6 oz (63.3 kg)   BMI 24.73 kg/m    VITAL SIGNS:  reviewed   Patient had normal speech pattern.  No evidence of dyspnea, cough, or wheezing noted during conversation.  ASSESSMENT & PLAN:    1. CAD in native artery Denies any anginal or exertional symptoms.  Continue aspirin 81 mg, nitroglycerin sublingual as needed.  Metoprolol 50 mg p.o. twice daily.  2. Mixed hyperlipidemia Continue atorvastatin 80 mg p.o. daily.  3. Essential hypertension Blood pressure 125/84 today.  Continue lisinopril 10 mg daily.  Continue metoprolol 50 mg p.o. twice daily.     COVID-19 Education: The signs and symptoms of COVID-19 were discussed with the patient and how to seek care for testing (follow up with PCP or arrange E-visit).  The importance of social distancing was discussed today.  Time:   Today, I have spent 10  minutes with the patient with telehealth technology discussing the above problems.     Medication Adjustments/Labs and Tests Ordered: Current medicines are reviewed at length with the patient today.  Concerns regarding medicines are outlined above.   Tests Ordered: No orders of the defined types were  placed in this encounter.   Medication Changes: No orders of the defined types were placed in this encounter.   Follow Up:  Either virtual or in person in 1 year(s)  Signed, , NP  03/10/2020 3:36  PM    [facility]

## 2020-03-10 ENCOUNTER — Telehealth (INDEPENDENT_AMBULATORY_CARE_PROVIDER_SITE_OTHER): Payer: Medicare Other | Admitting: Family Medicine

## 2020-03-10 ENCOUNTER — Encounter: Payer: Self-pay | Admitting: Family Medicine

## 2020-03-10 ENCOUNTER — Telehealth: Payer: Medicare Other | Admitting: Cardiology

## 2020-03-10 VITALS — BP 125/84 | HR 87 | Ht 63.0 in | Wt 139.6 lb

## 2020-03-10 DIAGNOSIS — E782 Mixed hyperlipidemia: Secondary | ICD-10-CM | POA: Diagnosis not present

## 2020-03-10 DIAGNOSIS — I2510 Atherosclerotic heart disease of native coronary artery without angina pectoris: Secondary | ICD-10-CM

## 2020-03-10 DIAGNOSIS — I10 Essential (primary) hypertension: Secondary | ICD-10-CM | POA: Diagnosis not present

## 2020-03-10 NOTE — Patient Instructions (Addendum)

## 2020-04-01 ENCOUNTER — Ambulatory Visit (INDEPENDENT_AMBULATORY_CARE_PROVIDER_SITE_OTHER): Payer: Medicare Other | Admitting: Gastroenterology

## 2020-04-01 ENCOUNTER — Telehealth: Payer: Self-pay | Admitting: Gastroenterology

## 2020-04-01 ENCOUNTER — Other Ambulatory Visit: Payer: Self-pay

## 2020-04-01 ENCOUNTER — Encounter: Payer: Self-pay | Admitting: Gastroenterology

## 2020-04-01 VITALS — BP 114/79 | HR 88 | Temp 96.2°F | Ht 63.0 in | Wt 140.8 lb

## 2020-04-01 DIAGNOSIS — K5900 Constipation, unspecified: Secondary | ICD-10-CM

## 2020-04-01 DIAGNOSIS — I2510 Atherosclerotic heart disease of native coronary artery without angina pectoris: Secondary | ICD-10-CM

## 2020-04-01 DIAGNOSIS — Z1211 Encounter for screening for malignant neoplasm of colon: Secondary | ICD-10-CM

## 2020-04-01 NOTE — Progress Notes (Signed)
Primary Care Physician: Lauretta Grill, NP  Primary Gastroenterologist:  Elon Alas. Abbey Chatters, DO   Chief Complaint  Patient presents with  . Abdominal Pain    Abd pain radiating around to his back  . Chest Pain    HPI: Oscar Elliott is a 60 y.o. male here for follow-up. Patient last seen in the office back in April. We have been trying since October 2020 to complete colon cancer screening. He has had 2 failed attempts to due to poor bowel prep. Last attempt was in September. After that point Dr. Abbey Chatters advised Cologuard for colon cancer screening.  According to group home staff present today, they never received kit at the facility.  They report submitting paperwork to our office.  Patient seen in the ED last month for chest pain. Chest x-ray on February 27, 2020 was unremarkable. Troponin normal x2. Urinalysis showed greater than 50 white blood cells, many bacteria, positive nitrite, small leukocytes.  Patient did complete antibiotics for UTI.  Hemoglobin 11.7, hematocrit 35.6, white blood cell count 7300, platelets 116,000., Glucose 67, BUN 20, creatinine 1.04, AST 24, ALT 30, alk phos 76, total bilirubin 0.4. EKG performed. Patient released back to group home based on unremarkable work-up for chest pain.  Patient states he is only having a BM once every couple of weeks.  With further questioning he states that really he is going every 2 to 3 days.  Patient uses the facilities on his own, bowel function is not monitored by staff.  Patient reports that he has to strain to have a bowel movement. No melena, brbpr. Appetite good. No heartburn. Feels bloated.  States his stomach hurts almost daily.  He cannot tell me what makes it worse or if anything makes it better.  Staff states that he does not complain of abdominal pain at the facility.  They report that he is a hypochondriac and reports frequent symptoms which makes it difficult to assess.  Patient initially told our staff that he has  chest pain.  He states that he had chest pain last month but not any currently.  Current Outpatient Medications  Medication Sig Dispense Refill  . acetaminophen (TYLENOL) 500 MG tablet Take 500 mg by mouth every 4 (four) hours as needed for moderate pain.    . ARIPiprazole (ABILIFY) 15 MG tablet Take 1 tablet (15 mg total) by mouth daily. 90 tablet 1  . aspirin EC 81 MG tablet Take 81 mg by mouth daily.    Marland Kitchen atorvastatin (LIPITOR) 80 MG tablet Take 1 tablet (80 mg total) by mouth daily at 6 PM. 30 tablet 11  . Cholecalciferol (VITAMIN D) 50 MCG (2000 UT) CAPS Take 2,000 Units by mouth once a week.     . clonazePAM (KLONOPIN) 0.5 MG tablet Take 1 tablet (0.5 mg total) by mouth at bedtime. (Patient taking differently: Take 0.5 mg by mouth every evening.) 30 tablet 0  . clopidogrel (PLAVIX) 75 MG tablet Take 75 mg by mouth daily.    . cloZAPine (CLOZARIL) 100 MG tablet 100 mg in AM and 500 mg at night (Patient taking differently: Take 100-500 mg by mouth See admin instructions. Take 100 mg by mouth in morning and 500 mg at night) 180 tablet 0  . diclofenac Sodium (VOLTAREN) 1 % GEL Apply 2 g topically 4 (four) times daily as needed (pain).     Marland Kitchen docusate sodium (COLACE) 100 MG capsule Take 100 mg by mouth 2 (two) times daily.    Marland Kitchen  DULoxetine (CYMBALTA) 60 MG capsule Take 2 capsules (120 mg total) by mouth daily. 180 capsule 0  . gabapentin (NEURONTIN) 100 MG capsule Take 100 mg by mouth 2 (two) times daily.    . insulin aspart (NOVOLOG) 100 UNIT/ML injection Inject 2 Units into the skin 3 (three) times daily before meals. (Patient taking differently: Inject 2 Units into the skin 3 (three) times daily as needed (blood sugar greater than 200 and resident eats a meal). Hold if resident does not eat) 10 mL 11  . insulin detemir (LEVEMIR) 100 UNIT/ML injection Inject 0.3 mLs (30 Units total) into the skin daily. (Patient taking differently: Inject 33 Units into the skin daily. Hold if pt does not eat .) 10  mL 11  . ketoconazole (NIZORAL) 2 % shampoo Apply 1 application topically 2 (two) times a week.    . latanoprost (XALATAN) 0.005 % ophthalmic solution Place 1 drop into both eyes at bedtime.    Marland Kitchen lisinopril (ZESTRIL) 10 MG tablet Take 10 mg by mouth daily. Hold if systolic is less than 410    . metoprolol (LOPRESSOR) 50 MG tablet Take 1 tablet (50 mg total) by mouth 2 (two) times daily. 180 tablet 3  . nitroGLYCERIN (NITROSTAT) 0.4 MG SL tablet Place 1 tablet (0.4 mg total) under the tongue every 5 (five) minutes x 3 doses as needed for chest pain. 25 tablet 12  . omeprazole (PRILOSEC) 40 MG capsule Take 40 mg by mouth 2 (two) times daily.     Marland Kitchen Plecanatide (TRULANCE) 3 MG TABS Take 3 mg by mouth daily. 90 tablet 3  . polyethylene glycol (MIRALAX / GLYCOLAX) 17 g packet Take 17 g by mouth daily as needed for moderate constipation.    . tamsulosin (FLOMAX) 0.4 MG CAPS capsule Take 0.4 mg by mouth daily.     No current facility-administered medications for this visit.    Allergies as of 04/01/2020 - Review Complete 04/01/2020  Allergen Reaction Noted  . Penicillins Other (See Comments) 11/20/2013    ROS:  General: Negative for anorexia, weight loss, fever, chills, fatigue, weakness. ENT: Negative for hoarseness, difficulty swallowing , nasal congestion. CV: Negative for chest pain, angina, palpitations, dyspnea on exertion, peripheral edema.  Respiratory: Negative for dyspnea at rest, dyspnea on exertion, cough, sputum, wheezing.  GI: See history of present illness. GU:  Negative for dysuria, hematuria, urinary incontinence, urinary frequency, nocturnal urination.  Endo: Negative for unusual weight change.    Physical Examination:   BP 114/79   Pulse 88   Temp (!) 96.2 F (35.7 C) (Temporal)   Ht _0  (1.6 m)   Wt 140 lb 12.8 oz (63.9 kg)   BMI 24.94 kg/m   General: Well-nourished, well-developed in no acute distress.  Eyes: No icterus. Mouth: masked Abdomen: Bowel sounds  are normal, nontender, nondistended, no hepatosplenomegaly or masses, no abdominal bruits or hernia , no rebound or guarding.   Extremities: No lower extremity edema. No clubbing or deformities. Neuro: Alert and oriented x 4   Skin: Warm and dry, no jaundice.   Psych: Alert and cooperative, normal mood and affect.  Labs:  Lab Results  Component Value Date   CREATININE 1.04 02/27/2020   BUN 20 02/27/2020   NA 139 02/27/2020   K 4.1 02/27/2020   CL 112 (H) 02/27/2020   CO2 23 02/27/2020   Lab Results  Component Value Date   ALT 30 02/27/2020   AST 24 02/27/2020   ALKPHOS 76 02/27/2020  BILITOT 0.4 02/27/2020   Lab Results  Component Value Date   WBC 7.3 02/27/2020   HGB 11.7 (L) 02/27/2020   HCT 35.6 (L) 02/27/2020   MCV 90.4 02/27/2020   PLT 116 (L) 02/27/2020      Imaging Studies: No results found.  Assessment:  Pleasant 60 year old male, difficult historian, with history of schizophrenia, diabetes, CAD/MI with stenting, peripheral vascular disease status post left above-the-knee amputation who presents for follow-up.  We have seen him multiple times for colon cancer screening.  He has had 2 failed attempts at colonoscopy since 2020 due to poor bowel prep.  We attempted to complete Cologuard back in September 2021 but paperwork was never received from the facility where he lives.  Presents today for follow-up.  He has multiple complaints including phantom pain from prior AKA, chest pain, abdominal pain.  Staff present with him today report that he has a longstanding history of hypochondriasis.  Recent ED visit for chest pain unremarkable.  Chronic constipation: Currently on docusate sodium 100 mg twice daily, Trulance 3 mg daily.  Initially patient reported 1 bowel movement every 2 weeks.  With further questioning he states that that has happened before but currently he is having a bowel movement every 2 to 3 days.  Complains of hard stools, having to strain.  Abdominal  pain: Staff present with patient today states that he has not showed any signs or symptoms of abdominal pain, has not typically complained of abdominal pain recently.  Patient's abdominal exam is benign.  Symptoms may be aggravated by constipation.  Colon cancer screening: Paperwork for Cologuard initiated today.    Plan:  1. Await Cologuard results. 2. Continue docusate sodium 100 mg twice daily. 3. Continue Trulance 3 mg daily. 4. Encouraged patient to request MiraLAX as needed for hard stools.  He can have 17 g daily as needed. 5. Request staff to monitor for abdominal pain, persistent complaints of abdominal pain, would offer CT abdomen pelvis for further evaluation.

## 2020-04-01 NOTE — Patient Instructions (Addendum)
1. We will restart process for Cologuard testing. 2. Continue docusate sodium 100 mg twice daily. 3. Continue Trulance 3 mg daily. 4. Monitor for hard stools, straining.  Use MiraLAX 17 g daily as needed for this. 5. Call if patient continues to complain of abdominal pain.  We would offer CT abdomen and pelvis to further evaluate if ongoing complaints of pain.

## 2020-04-01 NOTE — Telephone Encounter (Signed)
Please let group home know that we need to update patient's labs. I reviewed recent labs done in 02/2020 while in the ED.   Need to get CBC with diff, CMET.  Dx: anemia, abd pain

## 2020-04-04 ENCOUNTER — Other Ambulatory Visit: Payer: Self-pay

## 2020-04-04 DIAGNOSIS — R1084 Generalized abdominal pain: Secondary | ICD-10-CM

## 2020-04-04 DIAGNOSIS — D649 Anemia, unspecified: Secondary | ICD-10-CM

## 2020-04-04 NOTE — Telephone Encounter (Signed)
Labs forms done and put in the mail to the facility for the pt to have his labs done.

## 2020-04-04 NOTE — Telephone Encounter (Signed)
Phoned to the facility advised that the pt needed to update his labs from 02/2020 while he was in the ED. Advised to put in the mail and they would take the pt to have them drawn.

## 2020-05-03 ENCOUNTER — Telehealth (HOSPITAL_COMMUNITY): Payer: Self-pay | Admitting: *Deleted

## 2020-05-03 NOTE — Telephone Encounter (Signed)
[facility] called wanting refills for patient clonazePAM (KLONOPIN) 0.5 MG tablet  and patient ARIPiprazole (ABILIFY) 15 MG tablet

## 2020-05-04 ENCOUNTER — Other Ambulatory Visit (HOSPITAL_COMMUNITY): Payer: Self-pay | Admitting: Psychiatry

## 2020-05-04 MED ORDER — CLONAZEPAM 0.5 MG PO TABS
0.5000 mg | ORAL_TABLET | Freq: Every day | ORAL | 0 refills | Status: DC
Start: 2020-05-04 — End: 2022-05-03

## 2020-05-04 MED ORDER — ARIPIPRAZOLE 15 MG PO TABS
15.0000 mg | ORAL_TABLET | Freq: Every day | ORAL | 1 refills | Status: DC
Start: 2020-05-04 — End: 2022-04-25

## 2020-05-04 NOTE — Telephone Encounter (Signed)
Sent, he needs appt

## 2020-05-10 NOTE — Telephone Encounter (Signed)
Per note in appt note slot "Pt IS SEEING NEW PROVIDER, PLEASE DON'T CALL PHONE [phone] which is only number in patient chart".

## 2020-05-26 ENCOUNTER — Telehealth (HOSPITAL_COMMUNITY): Payer: Self-pay | Admitting: *Deleted

## 2020-05-26 NOTE — Telephone Encounter (Signed)
[facility] called stating patient is needing refills for his clonazePAM (KLONOPIN) 0.5 MG tablet so they can deliver it to the facility patient lives. [phone]

## 2020-05-26 NOTE — Telephone Encounter (Signed)
Dr. Hisada's pt 

## 2020-05-26 NOTE — Telephone Encounter (Signed)
[facility] called stating patient is needing refills for his clonazePAM (KLONOPIN) 0.5 MG tablet so they can deliver it to the facility patient lives. [phone] 

## 2020-05-26 NOTE — Telephone Encounter (Signed)
Decline. The patient is not seen since 02/2019. The following was discussed in 08/2019 when they requested medication refill, and the staff was informed that the patient has a new provider. Will send discharge letter to avoid any confusion.   -----(copied from note in 08/2019)------------------ Ordered refill of clonazepam per request from the pharmacy. Please contact the facility to make follow up appointment. I believe we encouraged them to do so last month. Please make them aware that no refill will be ordered without evaluation, and the patient will be discharged from the clinic due to non adherence to treatment plan if he is not seen within a month.

## 2020-05-30 NOTE — Telephone Encounter (Signed)
Informed Oscar Elliott at [facility] and informed her with what provider stated and she verbalized understanding and stated they will reach out to them to let them know.

## 2020-07-21 ENCOUNTER — Telehealth: Payer: Self-pay

## 2020-07-21 ENCOUNTER — Other Ambulatory Visit: Payer: Self-pay

## 2020-07-21 MED ORDER — LINACLOTIDE 145 MCG PO CAPS: 145.0000 ug | ORAL_CAPSULE | Freq: Every day | ORAL | 11 refills | Status: DC

## 2020-07-21 NOTE — Telephone Encounter (Signed)
noted 

## 2020-07-21 NOTE — Telephone Encounter (Signed)
This pt has been supplied with a temporary supply of Trulance 3 mg tab. This Rx is not on the pt's formulary. The alternate is Linzess cap 145 mcg a tier 4 drug. They are only giving him a 30 day supply. A PA as well as a appeal done for him and he was denied. Please advise regarding what to do after this 30 days?

## 2020-07-21 NOTE — Telephone Encounter (Signed)
See other telephone note.  Stop Trulance. RX for Linzess sent.

## 2020-07-21 NOTE — Addendum Note (Signed)
Addended by: Tiffany Kocher on: 07/21/2020 03:12 PM   Modules accepted: Orders

## 2020-07-21 NOTE — Telephone Encounter (Signed)
Pharmacist from next door [facility]) wants this pt changed from Trulance to Linzess 145 mcg. States new med cycle is beginning and Linzess is on this pts formulary. Please advise

## 2020-07-21 NOTE — Progress Notes (Signed)
ERROR

## 2020-07-21 NOTE — Telephone Encounter (Signed)
I have not seen this patient since 03/2020. Was doing well on Trulance at that time.   We can try Linzess daily. I will send in new RX. See other phone note.

## 2021-03-04 IMAGING — DX DG CHEST 2V
2 series · 2 of 2 positions shown · non-contrast
Comparison: None.

CLINICAL DATA: Chest pain

EXAM:
CHEST - 2 VIEW

[chest lat]
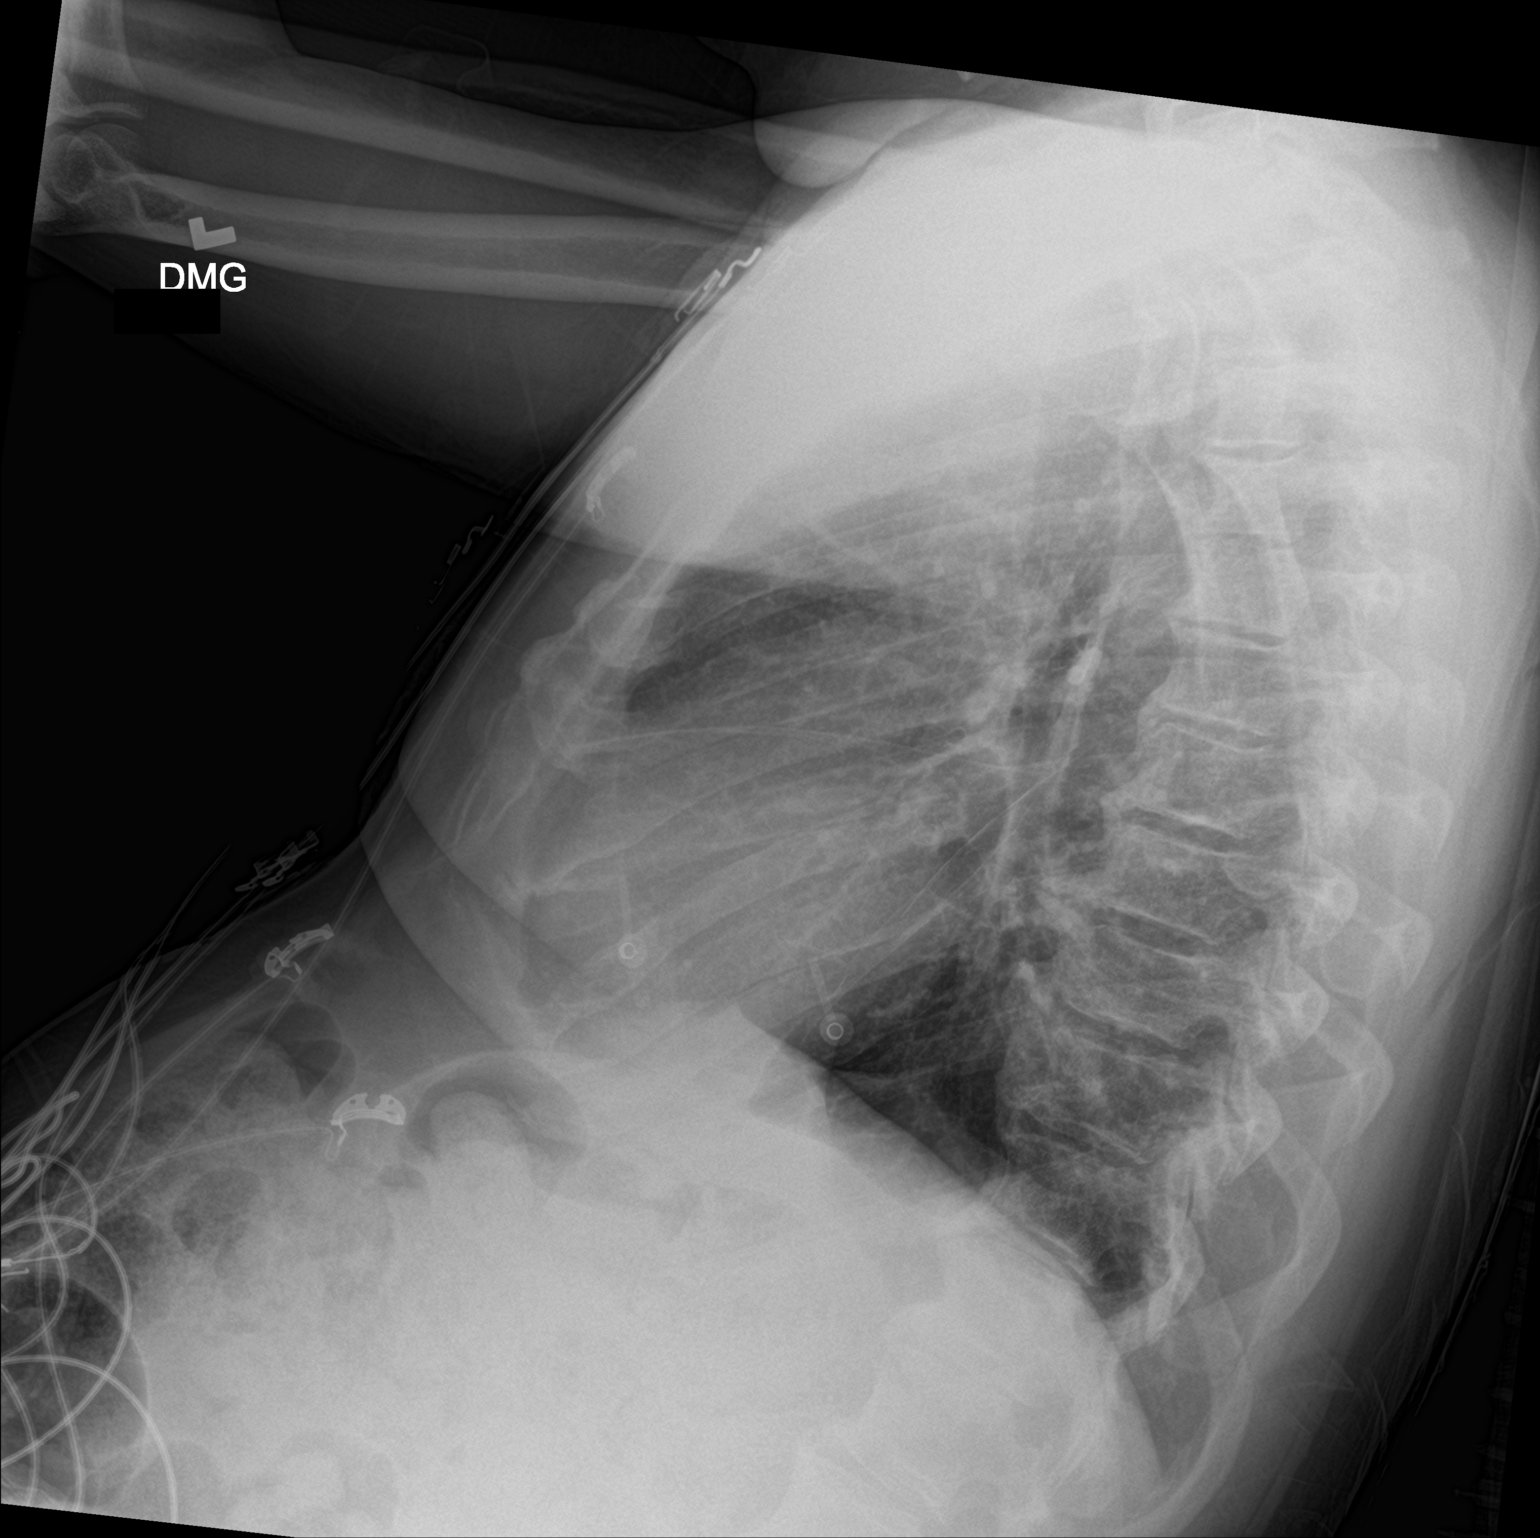

[chest ap]
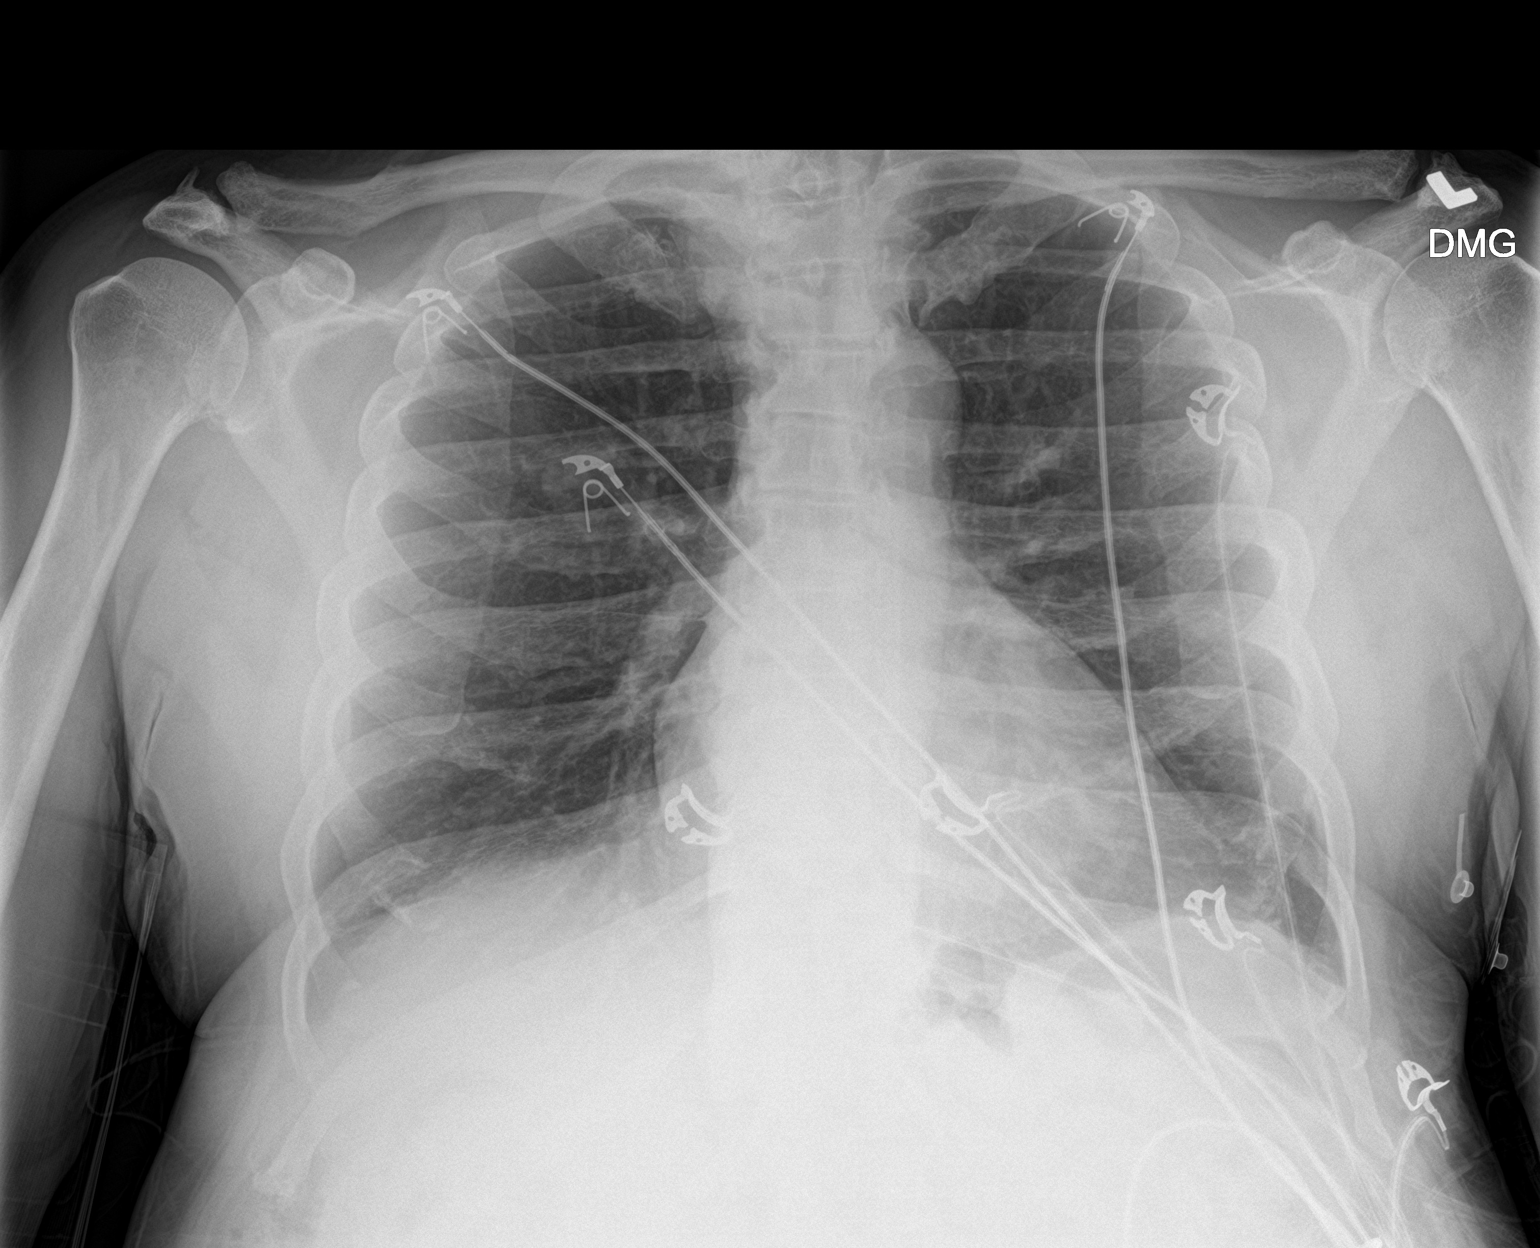

[2 of 2 positions shown; findings below may reference images not displayed]

FINDINGS: The heart size and mediastinal contours are within normal limits.
Both lungs are clear. The visualized skeletal structures are
unremarkable.
IMPRESSION: No active cardiopulmonary disease.

## 2021-05-11 ENCOUNTER — Telehealth: Payer: Self-pay | Admitting: *Deleted

## 2021-05-11 MED ORDER — LINACLOTIDE 145 MCG PO CAPS: 145.0000 ug | ORAL_CAPSULE | Freq: Every day | ORAL | 3 refills | Status: DC

## 2021-05-11 NOTE — Addendum Note (Signed)
Addended by: Tiffany Kocher on: 05/11/2021 09:22 AM ? ? Modules accepted: Orders ? ?

## 2021-05-11 NOTE — Telephone Encounter (Signed)
Received letter from [facility] requesting a 90 day supply of Linzess . Pt was last seen in the office on 04/01/2020 ?

## 2021-07-10 ENCOUNTER — Other Ambulatory Visit: Payer: Self-pay | Admitting: Gastroenterology

## 2021-07-10 NOTE — Telephone Encounter (Signed)
Spoke with caregiver and he states that the pt has never used [facility] that he needs refills sent in to [facility].  ?

## 2021-07-10 NOTE — Telephone Encounter (Signed)
Oscar Elliott, I sent in RX for one year to [facility] 04/2021. This RX is coming from [facility].  ?

## 2021-07-26 ENCOUNTER — Ambulatory Visit (INDEPENDENT_AMBULATORY_CARE_PROVIDER_SITE_OTHER): Payer: Medicare (Managed Care) | Admitting: Cardiology

## 2021-07-26 ENCOUNTER — Encounter: Payer: Self-pay | Admitting: Cardiology

## 2021-07-26 VITALS — BP 110/60 | HR 90 | Ht 61.0 in | Wt 130.0 lb

## 2021-07-26 DIAGNOSIS — I2510 Atherosclerotic heart disease of native coronary artery without angina pectoris: Secondary | ICD-10-CM

## 2021-07-26 DIAGNOSIS — I10 Essential (primary) hypertension: Secondary | ICD-10-CM

## 2021-07-26 DIAGNOSIS — E7800 Pure hypercholesterolemia, unspecified: Secondary | ICD-10-CM | POA: Diagnosis not present

## 2021-07-26 NOTE — Progress Notes (Signed)
Date:  07/26/2021   ID:  Oscar Elliott, DOB [birth year], MRN [id number] The patient was identified using 2 identifiers.  PCP:  Lauretta Grill, NP  Cardiologist:  Rozann Lesches, MD  Electrophysiologist:  None   Evaluation Performed:  Follow-Up Visit  Chief Complaint:  Cardiac follow up  History of Present Illness:    Oscar Elliott is a 61 y.o. male with CAD/ MI with prior history of DES to the RCA in 2015, HTN, HLD, PVD, DM2.  He has chronic NYHA class II dyspnea.  He is here today for followup due to a recent syncopal episode.  He tells me that he passed out but his SNF administrator is with him today and tells me that some of his meds make his feel sleepy and will think that he has passed out when he is just sleeping.  He says that the nurse checks frequently on him and has never had an episode of syncope.   He denies any chest pain or pressure, SOB, DOE, PND, orthopnea,  palpitations or syncope. He continues to smoke 5 cig per day.  He has a hx of lelt AKA and has chronic LE edema in the right leg. He is compliant with his meds and is tolerating meds with no SE.     Past Medical History:  Diagnosis Date   CAD (coronary artery disease)    a. 10/2013 Lateral STEMI/PCI: LM nl, LAD min irregs, D1 occluded sub branch, LCX min irregs mid, OM large/patent, RCA 125m (2.75x28 Promus DES).   Cholelithiasis    Essential hypertension    History of renal failure    a. 06/2007: in setting of rhabdo post L AKA.   Hyperlipidemia    MI (myocardial infarction) ([place])    Nephrolithiasis    Peripheral vascular disease ([place])    a. 06/2007: s/p L AKA @ [facility]   Schizophrenia ([place])    Type 2 diabetes mellitus ([place])    Past Surgical History:  Procedure Laterality Date   Above-the-knee amputation Left    COLONOSCOPY WITH PROPOFOL N/A 12/09/2018   fields: nonthrombosed hemorrhoids, decreased anal sphincter tone. poor prep, colonoscopy aborted   FLEXIBLE SIGMOIDOSCOPY  11/17/2019   Procedure: FLEXIBLE  SIGMOIDOSCOPY;  Surgeon: Eloise Harman, DO;  Location: [facility];  Service: Endoscopy;;   LEFT HEART CATHETERIZATION WITH CORONARY ANGIOGRAM N/A 11/20/2013   Procedure: LEFT HEART CATHETERIZATION WITH CORONARY ANGIOGRAM;  Surgeon: Jettie Booze, MD;  Location: [facility] CATH LAB;  Service: Cardiovascular;  Laterality: N/A;     Current Meds  Medication Sig   acetaminophen (TYLENOL) 500 MG tablet Take 500 mg by mouth every 4 (four) hours as needed for moderate pain.   ARIPiprazole (ABILIFY) 15 MG tablet Take 1 tablet (15 mg total) by mouth daily.   aspirin EC 81 MG tablet Take 81 mg by mouth daily.   atorvastatin (LIPITOR) 80 MG tablet Take 1 tablet (80 mg total) by mouth daily at 6 PM.   Cholecalciferol (VITAMIN D) 50 MCG (2000 UT) CAPS Take 2,000 Units by mouth once a week.    clonazePAM (KLONOPIN) 0.5 MG tablet Take 1 tablet (0.5 mg total) by mouth at bedtime.   clopidogrel (PLAVIX) 75 MG tablet Take 75 mg by mouth daily.   cloZAPine (CLOZARIL) 100 MG tablet 100 mg in AM and 500 mg at night (Patient taking differently: Take 100-500 mg by mouth See admin instructions. Take 100 mg by mouth in morning and 500 mg at night)   diclofenac Sodium (  VOLTAREN) 1 % GEL Apply 2 g topically 4 (four) times daily as needed (pain).    docusate sodium (COLACE) 100 MG capsule Take 100 mg by mouth 2 (two) times daily.   DULoxetine (CYMBALTA) 60 MG capsule Take 2 capsules (120 mg total) by mouth daily.   gabapentin (NEURONTIN) 100 MG capsule Take 100 mg by mouth 2 (two) times daily.   insulin detemir (LEVEMIR) 100 UNIT/ML injection Inject 0.3 mLs (30 Units total) into the skin daily. (Patient taking differently: Inject 33 Units into the skin daily. Hold if pt does not eat .)   ketoconazole (NIZORAL) 2 % shampoo Apply 1 application topically 2 (two) times a week.   latanoprost (XALATAN) 0.005 % ophthalmic solution Place 1 drop into both eyes at bedtime.   linaclotide (LINZESS) 145 MCG CAPS capsule TAKE 1  CAPSULE BY MOUTH ONCE DAILY BEFORE BREAKFAST.   lisinopril (ZESTRIL) 10 MG tablet Take 10 mg by mouth daily. Hold if systolic is less than [id number]   metoprolol (LOPRESSOR) 50 MG tablet Take 1 tablet (50 mg total) by mouth 2 (two) times daily.   nitroGLYCERIN (NITROSTAT) 0.4 MG SL tablet Place 1 tablet (0.4 mg total) under the tongue every 5 (five) minutes x 3 doses as needed for chest pain.   omeprazole (PRILOSEC) 40 MG capsule Take 40 mg by mouth 2 (two) times daily.    tamsulosin (FLOMAX) 0.4 MG CAPS capsule Take 0.4 mg by mouth daily.     Allergies:   Penicillins   Social History   Tobacco Use   Smoking status: Every Day    Packs/day: 0.25    Years: 20.00    Pack years: 5.00    Types: Cigars, Cigarettes   Smokeless tobacco: Never   Tobacco comments:    1-2 cig daily  Vaping Use   Vaping Use: Never used  Substance Use Topics   Alcohol use: No    Alcohol/week: 0.0 standard drinks   Drug use: No     Family Hx: The patient's family history includes Other in an other family member.  ROS:   Please see the history of present illness.    All other systems reviewed and are negative.   Prior CV studies:   The following studies were reviewed today:    Echocardiogram 05/31/2016: Study Conclusions   - Left ventricle: The cavity size was normal. Wall thickness was   increased in a pattern of mild LVH. Systolic function was at the   lower limits of normal. The estimated ejection fraction was 50%.   Doppler parameters are consistent with abnormal left ventricular   relaxation (grade 1 diastolic dysfunction). - Regional wall motion abnormality: Mild hypokinesis of the mid   inferoseptal and basal-mid inferior myocardium  Labs/Other Tests and Data Reviewed:    EKG: NSR with no ST changes  Recent Labs: No results found for requested labs within last 8760 hours.   Recent Lipid Panel Lab Results  Component Value Date/Time   CHOL 102 11/21/2013 01:23 AM   TRIG 66 11/21/2013  01:23 AM   HDL 27 (L) 11/21/2013 01:23 AM   CHOLHDL 3.8 11/21/2013 01:23 AM   LDLCALC 62 11/21/2013 01:23 AM    Wt Readings from Last 3 Encounters:  07/26/21 130 lb (59 kg)  04/01/20 140 lb 12.8 oz (63.9 kg)  03/10/20 139 lb 9.6 oz (63.3 kg)     Risk Assessment/Calculations:      Objective:    Vital Signs:  BP 110/60   Pulse 90  Ht 5\' 1"  (1.549 m)   Wt 130 lb (59 kg)   SpO2 97%   BMI 24.56 kg/m    VITAL SIGNS:  reviewed   GEN: Well nourished, well developed in no acute distress HEENT: Normal NECK: No JVD; No carotid bruits LYMPHATICS: No lymphadenopathy CARDIAC:RRR, no murmurs, rubs, gallops RESPIRATORY:  Clear to auscultation without rales, wheezing or rhonchi  ABDOMEN: Soft, non-tender, non-distended MUSCULOSKELETAL:  No edema; left AKA SKIN: Warm and dry NEUROLOGIC:  Alert and oriented x 3 PSYCHIATRIC:  Normal affect   ASSESSMENT & PLAN:    1. CAD in native artery -S/P MI with prior history of DES to the RCA in 2015 -He has not had any anginal symptoms since being seen last  -continue prescription drug management with aspirin 81 mg daily, Plavix 75mg  daily, metoprolol 50 mg p.o. twice daily   2. Mixed hyperlipidemia -LDL goal less than 70  -Continue prescription drug management with atorvastatin 80 mg daily  -check FLP and ALT  3. Essential hypertension -BP is adequately controlled on exam today  -continue prescription drug management lisinopril 10 mg daily and Lopressor 50 mg twice daily with as needed refills    Medication Adjustments/Labs and Tests Ordered: Current medicines are reviewed at length with the patient today.  Concerns regarding medicines are outlined above.   Tests Ordered: Orders Placed This Encounter  Procedures   EKG 12-Lead    Medication Changes: No orders of the defined types were placed in this encounter.   Follow Up:   Either virtual or in person  in 1 year(s)  Signed, Fransico Him, MD  07/26/2021 1:26 PM    [place]

## 2021-07-26 NOTE — Patient Instructions (Signed)
Medication Instructions:  Your physician recommends that you continue on your current medications as directed. Please refer to the Current Medication list given to you today.  *If you need a refill on your cardiac medications before your next appointment, please call your pharmacy*   Lab Work: Your physician recommends that you return for lab work in: Fasting   If you have labs (blood work) drawn today and your tests are completely normal, you will receive your results only by: [place] (if you have [place]) OR A paper copy in the mail If you have any lab test that is abnormal or we need to change your treatment, we will call you to review the results.   Testing/Procedures: NONE    Follow-Up: At [facility], you and your health needs are our priority.  As part of our continuing mission to provide you with exceptional heart care, we have created designated Provider Care Teams.  These Care Teams include your primary Cardiologist (physician) and Advanced Practice Providers (APPs -  Physician Assistants and Nurse Practitioners) who all work together to provide you with the care you need, when you need it.  We recommend signing up for the patient portal called "MyChart".  Sign up information is provided on this After Visit Summary.  MyChart is used to connect with patients for Virtual Visits (Telemedicine).  Patients are able to view lab/test results, encounter notes, upcoming appointments, etc.  Non-urgent messages can be sent to your provider as well.   To learn more about what you can do with MyChart, go to [email or web address].    Your next appointment:   1 year(s)  The format for your next appointment:   In Person  Provider:   Rozann Lesches, MD    Other Instructions Thank you for choosing [facility]!    Important Information About Sugar

## 2021-07-26 NOTE — Addendum Note (Signed)
Addended by: Kerney Elbe on: 07/26/2021 01:33 PM   Modules accepted: Orders

## 2022-01-29 NOTE — Progress Notes (Unsigned)
Office Visit    Patient Name: Oscar Elliott Date of Encounter: 01/30/2022  PCP:  Anselm Jungling, NP    [facility]  Cardiologist:  Nona Dell, MD  Advanced Practice Provider:  Dyann Kief, PA-C Electrophysiologist:  None   HPI    Oscar Elliott is a 61 y.o. male with past medical history of CAD/MI with prior history of DES to the RCA in 2015, hypertension, hyperlipidemia, HLD, PVD, DM 2 presents today for follow-up appointment.  He had chronic NYHA class II dyspnea.  He had a recent syncopal episode when he was last seen 06/2021.  He passed out at his SNF administrator who is with him at today told him that some of his medications made him feel sleepy.  He says the nurse checks frequently on him and he never had an episode of syncope.  He denies chest pain pressure, SOB, DOE, PND, orthopnea, palpitations or syncope.  He smokes 5  cigarettes per day.  He has history of left AKA and chronic LE edema on the right leg.  Compliant with meds and tolerating without any side effects.    Today, he has been having some swelling in his leg and some dizziness and headache which has been chronic and thought to be due to his medications. He has not had any recent lab work done so we discussed getting a BMP and lipid panel today. Overall, no chest pain or SOB. Leg swelling started 2-3 weeks ago. EKG reviewed and stable this morning.   Reports no shortness of breath nor dyspnea on exertion. Reports no chest pain, pressure, or tightness. No orthopnea, PND. Reports no palpitations.   Past Medical History    Past Medical History:  Diagnosis Date   CAD (coronary artery disease)    a. 10/2013 Lateral STEMI/PCI: LM nl, LAD min irregs, D1 occluded sub branch, LCX min irregs mid, OM large/patent, RCA 1101m (2.75x28 Promus DES).   Cholelithiasis    Essential hypertension    History of renal failure    a. 06/2007: in setting of rhabdo post L AKA.   Hyperlipidemia    MI  (myocardial infarction) (HCC)    Nephrolithiasis    Peripheral vascular disease (HCC)    a. 06/2007: s/p L AKA @ [facility]   Schizophrenia (HCC)    Type 2 diabetes mellitus (HCC)    Past Surgical History:  Procedure Laterality Date   Above-the-knee amputation Left    COLONOSCOPY WITH PROPOFOL N/A 12/09/2018   fields: nonthrombosed hemorrhoids, decreased anal sphincter tone. poor prep, colonoscopy aborted   FLEXIBLE SIGMOIDOSCOPY  11/17/2019   Procedure: FLEXIBLE SIGMOIDOSCOPY;  Surgeon: Lanelle Bal, DO;  Location: [facility];  Service: Endoscopy;;   LEFT HEART CATHETERIZATION WITH CORONARY ANGIOGRAM N/A 11/20/2013   Procedure: LEFT HEART CATHETERIZATION WITH CORONARY ANGIOGRAM;  Surgeon: Corky Crafts, MD;  Location: [facility] CATH LAB;  Service: Cardiovascular;  Laterality: N/A;    Allergies  Allergies  Allergen Reactions   Penicillins Other (See Comments)    DIZZINESS     EKGs/Labs/Other Studies Reviewed:   The following studies were reviewed today:  Echocardiogram 05/31/2016: Study Conclusions   - Left ventricle: The cavity size was normal. Wall thickness was   increased in a pattern of mild LVH. Systolic function was at the   lower limits of normal. The estimated ejection fraction was 50%.   Doppler parameters are consistent with abnormal left ventricular   relaxation (grade 1 diastolic dysfunction). - Regional wall motion abnormality:  Mild hypokinesis of the mid   inferoseptal and basal-mid inferior myocardium  EKG:  EKG is  ordered today.  The ekg ordered today demonstrates NSR, rate 79 bpm  Recent Labs: No results found for requested labs within last 365 days.  Recent Lipid Panel    Component Value Date/Time   CHOL 102 11/21/2013 0123   TRIG 66 11/21/2013 0123   HDL 27 (L) 11/21/2013 0123   CHOLHDL 3.8 11/21/2013 0123   VLDL 13 11/21/2013 0123   LDLCALC 62 11/21/2013 0123    Home Medications   Current Meds  Medication Sig   acetaminophen (TYLENOL) 500  MG tablet Take 500 mg by mouth every 4 (four) hours as needed for moderate pain.   ARIPiprazole (ABILIFY) 15 MG tablet Take 1 tablet (15 mg total) by mouth daily.   aspirin EC 81 MG tablet Take 81 mg by mouth daily.   atorvastatin (LIPITOR) 80 MG tablet Take 1 tablet (80 mg total) by mouth daily at 6 PM.   Cholecalciferol (VITAMIN D) 50 MCG (2000 UT) CAPS Take 2,000 Units by mouth once a week.    clonazePAM (KLONOPIN) 0.5 MG tablet Take 1 tablet (0.5 mg total) by mouth at bedtime.   clopidogrel (PLAVIX) 75 MG tablet Take 75 mg by mouth daily.   cloZAPine (CLOZARIL) 100 MG tablet 100 mg in AM and 500 mg at night (Patient taking differently: Take 100-500 mg by mouth See admin instructions. Take 100 mg by mouth in morning and 500 mg at night)   diclofenac Sodium (VOLTAREN) 1 % GEL Apply 2 g topically 4 (four) times daily as needed (pain).    docusate sodium (COLACE) 100 MG capsule Take 100 mg by mouth 2 (two) times daily.   DULoxetine (CYMBALTA) 60 MG capsule Take 2 capsules (120 mg total) by mouth daily.   EASYMAX TEST test strip 1 each by Other route as needed.   gabapentin (NEURONTIN) 100 MG capsule Take 100 mg by mouth 2 (two) times daily.   insulin aspart (NOVOLOG) 100 UNIT/ML injection Inject 2 Units into the skin 3 (three) times daily before meals.   insulin detemir (LEVEMIR) 100 UNIT/ML injection Inject 0.3 mLs (30 Units total) into the skin daily. (Patient taking differently: Inject 33 Units into the skin daily. Hold if pt does not eat .)   ketoconazole (NIZORAL) 2 % shampoo Apply 1 application topically 2 (two) times a week.   latanoprost (XALATAN) 0.005 % ophthalmic solution Place 1 drop into both eyes at bedtime.   linaclotide (LINZESS) 145 MCG CAPS capsule TAKE 1 CAPSULE BY MOUTH ONCE DAILY BEFORE BREAKFAST.   lisinopril (ZESTRIL) 10 MG tablet Take 10 mg by mouth daily. Hold if systolic is less than 123XX123   metoprolol (LOPRESSOR) 50 MG tablet Take 1 tablet (50 mg total) by mouth 2 (two)  times daily.   nitroGLYCERIN (NITROSTAT) 0.4 MG SL tablet Place 1 tablet (0.4 mg total) under the tongue every 5 (five) minutes x 3 doses as needed for chest pain.   omeprazole (PRILOSEC) 40 MG capsule Take 40 mg by mouth 2 (two) times daily.    polyethylene glycol (MIRALAX / GLYCOLAX) 17 g packet Take 17 g by mouth daily as needed for moderate constipation.   tamsulosin (FLOMAX) 0.4 MG CAPS capsule Take 0.4 mg by mouth daily.     Review of Systems      All other systems reviewed and are otherwise negative except as noted above.  Physical Exam    VS:  BP  132/78   Pulse 79   Ht 5\' 1"  (1.549 m)   Wt 130 lb 3.2 oz (59.1 kg)   SpO2 99%   BMI 24.60 kg/m  , BMI Body mass index is 24.6 kg/m.  Wt Readings from Last 3 Encounters:  01/30/22 130 lb 3.2 oz (59.1 kg)  07/26/21 130 lb (59 kg)  04/01/20 140 lb 12.8 oz (63.9 kg)     GEN: Well nourished, well developed, in no acute distress. HEENT: normal. Neck: Supple, no JVD, carotid bruits, or masses. Cardiac: RRR, no murmurs, rubs, or gallops. No clubbing, cyanosis, 1 + pitting edema right leg (left AKA).  Radials/PT 2+ and equal bilaterally.  Respiratory:  Respirations regular and unlabored, clear to auscultation bilaterally. GI: Soft, nontender, nondistended. MS: No deformity or atrophy. Skin: Warm and dry, no rash. Neuro:  Strength and sensation are intact. Psych: Normal affect.  Assessment & Plan    CAD -no chest pain or SOB -continue current medications -ASA 81mg , Lipitor 80mg  daily, Plavix 75mg  daily, Lisinopril 10mg  daily, Metoprolol tartrate 50mg  BID, and nitro as needed  Mixed hyperlipidemia -no recent lipid panel, will order today -continue lipitor  Hypertension -well controlled today, continue current medication regimen  Blood in urine/stool -increase water intake -stool softener and/or miralax -follow-up with primary if it continues    Disposition: Follow up 3 months with Rozann Lesches, MD or  [facility].  Signed, Elgie Collard, PA-C 01/30/2022, 2:03 PM [place]

## 2022-01-30 ENCOUNTER — Ambulatory Visit: Payer: Medicare (Managed Care) | Attending: Physician Assistant | Admitting: Physician Assistant

## 2022-01-30 ENCOUNTER — Encounter: Payer: Self-pay | Admitting: Physician Assistant

## 2022-01-30 VITALS — BP 132/78 | HR 79 | Ht 61.0 in | Wt 130.2 lb

## 2022-01-30 DIAGNOSIS — I2510 Atherosclerotic heart disease of native coronary artery without angina pectoris: Secondary | ICD-10-CM | POA: Diagnosis not present

## 2022-01-30 DIAGNOSIS — E785 Hyperlipidemia, unspecified: Secondary | ICD-10-CM | POA: Diagnosis not present

## 2022-01-30 DIAGNOSIS — I10 Essential (primary) hypertension: Secondary | ICD-10-CM

## 2022-01-30 DIAGNOSIS — K921 Melena: Secondary | ICD-10-CM | POA: Diagnosis not present

## 2022-01-30 NOTE — Patient Instructions (Signed)
Medication Instructions:  Your physician recommends that you continue on your current medications as directed. Please refer to the Current Medication list given to you today.  *If you need a refill on your cardiac medications before your next appointment, please call your pharmacy*   Lab Work: CMP and lipid today BMP in one week If you have labs (blood work) drawn today and your tests are completely normal, you will receive your results only by: MyChart Message (if you have MyChart) OR A paper copy in the mail If you have any lab test that is abnormal or we need to change your treatment, we will call you to review the results.   Follow-Up: At [facility], you and your health needs are our priority.  As part of our continuing mission to provide you with exceptional heart care, we have created designated Provider Care Teams.  These Care Teams include your primary Cardiologist (physician) and Advanced Practice Providers (APPs -  Physician Assistants and Nurse Practitioners) who all work together to provide you with the care you need, when you need it.  We recommend signing up for the patient portal called "MyChart".  Sign up information is provided on this After Visit Summary.  MyChart is used to connect with patients for Virtual Visits (Telemedicine).  Patients are able to view lab/test results, encounter notes, upcoming appointments, etc.  Non-urgent messages can be sent to your provider as well.   To learn more about what you can do with MyChart, go to [email or web address].    Your next appointment:   3 month(s)  The format for your next appointment:   In Person  Provider:   You may see Nona Dell, MD or one of the following Advanced Practice Providers on your designated Care Team:   Randall An, PA-C  [name], PA-C     Important Information About Sugar

## 2022-01-31 LAB — COMPREHENSIVE METABOLIC PANEL
ALT: 32 IU/L (ref 0–44)
AST: 27 IU/L (ref 0–40)
Albumin/Globulin Ratio: 2 (ref 1.2–2.2)
Albumin: 4.1 g/dL (ref 3.9–4.9)
Alkaline Phosphatase: 92 IU/L (ref 44–121)
BUN/Creatinine Ratio: 16 (ref 10–24)
BUN: 18 mg/dL (ref 8–27)
Bilirubin Total: 0.2 mg/dL (ref 0.0–1.2)
CO2: 24 mmol/L (ref 20–29)
Calcium: 8.9 mg/dL (ref 8.6–10.2)
Chloride: 109 mmol/L — ABNORMAL HIGH (ref 96–106)
Creatinine, Ser: 1.13 mg/dL (ref 0.76–1.27)
Globulin, Total: 2.1 g/dL (ref 1.5–4.5)
Glucose: 110 mg/dL — ABNORMAL HIGH (ref 70–99)
Potassium: 4.2 mmol/L (ref 3.5–5.2)
Sodium: 144 mmol/L (ref 134–144)
Total Protein: 6.2 g/dL (ref 6.0–8.5)
eGFR: 73 mL/min/{1.73_m2} (ref >59)

## 2022-01-31 LAB — LIPID PANEL
Chol/HDL Ratio: 2.1 ratio (ref 0.0–5.0)
Cholesterol, Total: 111 mg/dL (ref 100–199)
HDL: 53 mg/dL (ref >39)
LDL Chol Calc (NIH): 45 mg/dL (ref 0–99)
Triglycerides: 60 mg/dL (ref 0–149)
VLDL Cholesterol Cal: 13 mg/dL (ref 5–40)

## 2022-04-23 ENCOUNTER — Emergency Department: Payer: Medicare (Managed Care)

## 2022-04-23 ENCOUNTER — Inpatient Hospital Stay: Payer: Medicare (Managed Care)

## 2022-04-23 ENCOUNTER — Inpatient Hospital Stay
Admission: EM | Admit: 2022-04-23 | Discharge: 2022-04-25 | DRG: 872 | Disposition: A | Discharge status: Other Acute Inpatient Hospital | Payer: Medicare (Managed Care) | Attending: Student | Admitting: Student

## 2022-04-23 ENCOUNTER — Encounter: Payer: Self-pay | Admitting: Emergency Medicine

## 2022-04-23 DIAGNOSIS — Z79899 Other long term (current) drug therapy: Secondary | ICD-10-CM | POA: Diagnosis not present

## 2022-04-23 DIAGNOSIS — R401 Stupor: Secondary | ICD-10-CM | POA: Diagnosis not present

## 2022-04-23 DIAGNOSIS — I2510 Atherosclerotic heart disease of native coronary artery without angina pectoris: Secondary | ICD-10-CM | POA: Diagnosis present

## 2022-04-23 DIAGNOSIS — Z89512 Acquired absence of left leg below knee: Secondary | ICD-10-CM

## 2022-04-23 DIAGNOSIS — A4151 Sepsis due to Escherichia coli [E. coli]: Principal | ICD-10-CM | POA: Diagnosis present

## 2022-04-23 DIAGNOSIS — Z955 Presence of coronary angioplasty implant and graft: Secondary | ICD-10-CM | POA: Diagnosis not present

## 2022-04-23 DIAGNOSIS — Z7902 Long term (current) use of antithrombotics/antiplatelets: Secondary | ICD-10-CM | POA: Diagnosis not present

## 2022-04-23 DIAGNOSIS — E860 Dehydration: Secondary | ICD-10-CM | POA: Diagnosis present

## 2022-04-23 DIAGNOSIS — G9341 Metabolic encephalopathy: Secondary | ICD-10-CM | POA: Diagnosis not present

## 2022-04-23 DIAGNOSIS — N179 Acute kidney failure, unspecified: Secondary | ICD-10-CM | POA: Diagnosis present

## 2022-04-23 DIAGNOSIS — R5381 Other malaise: Secondary | ICD-10-CM | POA: Diagnosis not present

## 2022-04-23 DIAGNOSIS — F17290 Nicotine dependence, other tobacco product, uncomplicated: Secondary | ICD-10-CM | POA: Diagnosis present

## 2022-04-23 DIAGNOSIS — Z7982 Long term (current) use of aspirin: Secondary | ICD-10-CM

## 2022-04-23 DIAGNOSIS — N400 Enlarged prostate without lower urinary tract symptoms: Secondary | ICD-10-CM | POA: Diagnosis present

## 2022-04-23 DIAGNOSIS — G40409 Other generalized epilepsy and epileptic syndromes, not intractable, without status epilepticus: Secondary | ICD-10-CM | POA: Diagnosis not present

## 2022-04-23 DIAGNOSIS — D696 Thrombocytopenia, unspecified: Secondary | ICD-10-CM | POA: Diagnosis not present

## 2022-04-23 DIAGNOSIS — F209 Schizophrenia, unspecified: Secondary | ICD-10-CM | POA: Diagnosis present

## 2022-04-23 DIAGNOSIS — I10 Essential (primary) hypertension: Secondary | ICD-10-CM | POA: Diagnosis present

## 2022-04-23 DIAGNOSIS — R569 Unspecified convulsions: Secondary | ICD-10-CM | POA: Diagnosis not present

## 2022-04-23 DIAGNOSIS — Z9861 Coronary angioplasty status: Secondary | ICD-10-CM | POA: Diagnosis not present

## 2022-04-23 DIAGNOSIS — G40309 Generalized idiopathic epilepsy and epileptic syndromes, not intractable, without status epilepticus: Secondary | ICD-10-CM | POA: Diagnosis not present

## 2022-04-23 DIAGNOSIS — I252 Old myocardial infarction: Secondary | ICD-10-CM

## 2022-04-23 DIAGNOSIS — D6959 Other secondary thrombocytopenia: Secondary | ICD-10-CM | POA: Diagnosis present

## 2022-04-23 DIAGNOSIS — E1151 Type 2 diabetes mellitus with diabetic peripheral angiopathy without gangrene: Secondary | ICD-10-CM | POA: Diagnosis present

## 2022-04-23 DIAGNOSIS — R251 Tremor, unspecified: Principal | ICD-10-CM

## 2022-04-23 DIAGNOSIS — D509 Iron deficiency anemia, unspecified: Secondary | ICD-10-CM | POA: Diagnosis present

## 2022-04-23 DIAGNOSIS — A419 Sepsis, unspecified organism: Secondary | ICD-10-CM

## 2022-04-23 DIAGNOSIS — E119 Type 2 diabetes mellitus without complications: Secondary | ICD-10-CM | POA: Diagnosis not present

## 2022-04-23 DIAGNOSIS — N390 Urinary tract infection, site not specified: Secondary | ICD-10-CM | POA: Diagnosis present

## 2022-04-23 DIAGNOSIS — Z880 Allergy status to penicillin: Secondary | ICD-10-CM

## 2022-04-23 DIAGNOSIS — E785 Hyperlipidemia, unspecified: Secondary | ICD-10-CM | POA: Diagnosis present

## 2022-04-23 DIAGNOSIS — R4182 Altered mental status, unspecified: Secondary | ICD-10-CM | POA: Insufficient documentation

## 2022-04-23 DIAGNOSIS — G40901 Epilepsy, unspecified, not intractable, with status epilepticus: Secondary | ICD-10-CM | POA: Diagnosis not present

## 2022-04-23 DIAGNOSIS — Z794 Long term (current) use of insulin: Secondary | ICD-10-CM

## 2022-04-23 DIAGNOSIS — S065XAA Traumatic subdural hemorrhage with loss of consciousness status unknown, initial encounter: Secondary | ICD-10-CM | POA: Diagnosis not present

## 2022-04-23 DIAGNOSIS — G9340 Encephalopathy, unspecified: Secondary | ICD-10-CM | POA: Diagnosis not present

## 2022-04-23 DIAGNOSIS — G40909 Epilepsy, unspecified, not intractable, without status epilepticus: Secondary | ICD-10-CM | POA: Diagnosis not present

## 2022-04-23 DIAGNOSIS — I739 Peripheral vascular disease, unspecified: Secondary | ICD-10-CM | POA: Diagnosis not present

## 2022-04-23 DIAGNOSIS — R6520 Severe sepsis without septic shock: Secondary | ICD-10-CM | POA: Diagnosis present

## 2022-04-23 DIAGNOSIS — D649 Anemia, unspecified: Secondary | ICD-10-CM | POA: Diagnosis not present

## 2022-04-23 DIAGNOSIS — R1312 Dysphagia, oropharyngeal phase: Secondary | ICD-10-CM | POA: Diagnosis not present

## 2022-04-23 LAB — CBC WITH DIFFERENTIAL/PLATELET
Abs Immature Granulocytes: 0.84 10*3/uL — ABNORMAL HIGH (ref 0.00–0.07)
Basophils Absolute: 0 10*3/uL (ref 0.0–0.1)
Basophils Relative: 0 %
Eosinophils Absolute: 0 10*3/uL (ref 0.0–0.5)
Eosinophils Relative: 0 %
HCT: 31 % — ABNORMAL LOW (ref 39.0–52.0)
Hemoglobin: 10.5 g/dL — ABNORMAL LOW (ref 13.0–17.0)
Immature Granulocytes: 10 %
Lymphocytes Relative: 3 %
Lymphs Abs: 0.3 10*3/uL — ABNORMAL LOW (ref 0.7–4.0)
MCH: 29.3 pg (ref 26.0–34.0)
MCHC: 33.9 g/dL (ref 30.0–36.0)
MCV: 86.6 fL (ref 80.0–100.0)
Monocytes Absolute: 0.2 10*3/uL (ref 0.1–1.0)
Monocytes Relative: 2 %
Neutro Abs: 7.1 10*3/uL (ref 1.7–7.7)
Neutrophils Relative %: 85 %
Platelets: 89 10*3/uL — ABNORMAL LOW (ref 150–400)
RBC: 3.58 MIL/uL — ABNORMAL LOW (ref 4.22–5.81)
RDW: 16.2 % — ABNORMAL HIGH (ref 11.5–15.5)
Smear Review: NORMAL
WBC: 8.4 10*3/uL (ref 4.0–10.5)
nRBC: 0 % (ref 0.0–0.2)

## 2022-04-23 LAB — URINALYSIS, ROUTINE W REFLEX MICROSCOPIC
Bilirubin Urine: NEGATIVE
Glucose, UA: NEGATIVE mg/dL
Ketones, ur: NEGATIVE mg/dL
Nitrite: NEGATIVE
Protein, ur: 100 mg/dL — AB
Specific Gravity, Urine: 1.014 (ref 1.005–1.030)
WBC, UA: >50 WBC/hpf (ref 0–5)
pH: 5 (ref 5.0–8.0)

## 2022-04-23 LAB — COMPREHENSIVE METABOLIC PANEL
ALT: 30 U/L (ref 0–44)
AST: 45 U/L — ABNORMAL HIGH (ref 15–41)
Albumin: 3 g/dL — ABNORMAL LOW (ref 3.5–5.0)
Alkaline Phosphatase: 87 U/L (ref 38–126)
Anion gap: 12 (ref 5–15)
BUN: 56 mg/dL — ABNORMAL HIGH (ref 8–23)
CO2: 23 mmol/L (ref 22–32)
Calcium: 8.3 mg/dL — ABNORMAL LOW (ref 8.9–10.3)
Chloride: 102 mmol/L (ref 98–111)
Creatinine, Ser: 3.2 mg/dL — ABNORMAL HIGH (ref 0.61–1.24)
GFR, Estimated: 21 mL/min — ABNORMAL LOW (ref >60)
Glucose, Bld: 193 mg/dL — ABNORMAL HIGH (ref 70–99)
Potassium: 3.5 mmol/L (ref 3.5–5.1)
Sodium: 137 mmol/L (ref 135–145)
Total Bilirubin: 0.8 mg/dL (ref 0.3–1.2)
Total Protein: 6.5 g/dL (ref 6.5–8.1)

## 2022-04-23 LAB — CK: Total CK: 491 U/L — ABNORMAL HIGH (ref 49–397)

## 2022-04-23 LAB — MAGNESIUM: Magnesium: 2.1 mg/dL (ref 1.7–2.4)

## 2022-04-23 LAB — LACTIC ACID, PLASMA
Lactic Acid, Venous: 2.9 mmol/L (ref 0.5–1.9)
Lactic Acid, Venous: 1.7 mmol/L (ref 0.5–1.9)

## 2022-04-23 LAB — PROCALCITONIN: Procalcitonin: 20.51 ng/mL

## 2022-04-23 MED ORDER — GABAPENTIN 100 MG PO CAPS
100.0000 mg | ORAL_CAPSULE | Freq: Two times a day (BID) | ORAL | Status: DC
  Administered 2022-04-23 – 2022-04-25 (×4): 100 mg via ORAL
  Filled 2022-04-23 (×4): qty 1

## 2022-04-23 MED ORDER — DULOXETINE HCL 30 MG PO CPEP
120.0000 mg | ORAL_CAPSULE | Freq: Every day | ORAL | Status: DC
  Administered 2022-04-24 – 2022-04-25 (×2): 120 mg via ORAL
  Filled 2022-04-23 (×2): qty 4

## 2022-04-23 MED ORDER — POLYETHYLENE GLYCOL 3350 17 G PO PACK: 17.0000 g | PACK | Freq: Every day | ORAL | Status: DC | PRN

## 2022-04-23 MED ORDER — ARIPIPRAZOLE 10 MG PO TABS
15.0000 mg | ORAL_TABLET | Freq: Every day | ORAL | Status: DC
  Administered 2022-04-24 – 2022-04-25 (×2): 15 mg via ORAL
  Filled 2022-04-23 (×2): qty 2

## 2022-04-23 MED ORDER — SODIUM CHLORIDE 0.9% FLUSH
3.0000 mL | Freq: Two times a day (BID) | INTRAVENOUS | Status: DC
  Administered 2022-04-23 – 2022-04-25 (×4): 3 mL via INTRAVENOUS

## 2022-04-23 MED ORDER — SODIUM CHLORIDE 0.9 % IV SOLN
2.0000 g | Freq: Once | INTRAVENOUS | Status: AC
  Administered 2022-04-23: 2 g via INTRAVENOUS
  Filled 2022-04-23: qty 12.5

## 2022-04-23 MED ORDER — LACTATED RINGERS IV BOLUS
1000.0000 mL | Freq: Once | INTRAVENOUS | Status: AC
  Administered 2022-04-23: 1000 mL via INTRAVENOUS

## 2022-04-23 MED ORDER — VANCOMYCIN HCL IN DEXTROSE 1-5 GM/200ML-% IV SOLN
1000.0000 mg | Freq: Once | INTRAVENOUS | Status: AC
  Administered 2022-04-23: 1000 mg via INTRAVENOUS
  Filled 2022-04-23: qty 200

## 2022-04-23 MED ORDER — ACETAMINOPHEN 325 MG PO TABS
650.0000 mg | ORAL_TABLET | Freq: Four times a day (QID) | ORAL | Status: DC | PRN
  Administered 2022-04-23: 650 mg via ORAL
  Filled 2022-04-23: qty 2

## 2022-04-23 MED ORDER — BISACODYL 5 MG PO TBEC: 5.0000 mg | DELAYED_RELEASE_TABLET | Freq: Every day | ORAL | Status: DC | PRN

## 2022-04-23 MED ORDER — INSULIN ASPART 100 UNIT/ML IJ SOLN
0.0000 [IU] | Freq: Three times a day (TID) | INTRAMUSCULAR | Status: DC
  Administered 2022-04-24 – 2022-04-25 (×3): 2 [IU] via SUBCUTANEOUS
  Filled 2022-04-23 (×3): qty 1

## 2022-04-23 MED ORDER — ASPIRIN 81 MG PO TBEC
81.0000 mg | DELAYED_RELEASE_TABLET | Freq: Every day | ORAL | Status: DC
  Administered 2022-04-24 – 2022-04-25 (×2): 81 mg via ORAL
  Filled 2022-04-23 (×2): qty 1

## 2022-04-23 MED ORDER — CLOPIDOGREL BISULFATE 75 MG PO TABS
75.0000 mg | ORAL_TABLET | Freq: Every day | ORAL | Status: DC
  Administered 2022-04-24 – 2022-04-25 (×2): 75 mg via ORAL
  Filled 2022-04-23 (×2): qty 1

## 2022-04-23 MED ORDER — HEPARIN SODIUM (PORCINE) 5000 UNIT/ML IJ SOLN
5000.0000 [IU] | Freq: Three times a day (TID) | INTRAMUSCULAR | Status: DC
  Administered 2022-04-23 – 2022-04-24 (×2): 5000 [IU] via SUBCUTANEOUS
  Filled 2022-04-23 (×2): qty 1

## 2022-04-23 MED ORDER — LATANOPROST 0.005 % OP SOLN
1.0000 [drp] | Freq: Every day | OPHTHALMIC | Status: DC
  Administered 2022-04-24: 1 [drp] via OPHTHALMIC
  Filled 2022-04-23: qty 2.5

## 2022-04-23 MED ORDER — CLOZAPINE 100 MG PO TABS
500.0000 mg | ORAL_TABLET | Freq: Every day | ORAL | Status: DC
  Administered 2022-04-23 – 2022-04-24 (×2): 500 mg via ORAL
  Filled 2022-04-23 (×2): qty 5

## 2022-04-23 MED ORDER — ACETAMINOPHEN 500 MG PO TABS: 500.0000 mg | ORAL_TABLET | ORAL | Status: DC | PRN

## 2022-04-23 MED ORDER — VANCOMYCIN HCL 500 MG/100ML IV SOLN
500.0000 mg | Freq: Once | INTRAVENOUS | Status: AC
  Administered 2022-04-23: 500 mg via INTRAVENOUS
  Filled 2022-04-23 (×2): qty 100

## 2022-04-23 MED ORDER — DIPHENHYDRAMINE HCL 50 MG/ML IJ SOLN
25.0000 mg | Freq: Once | INTRAMUSCULAR | Status: AC
  Administered 2022-04-23: 25 mg via INTRAVENOUS
  Filled 2022-04-23: qty 1

## 2022-04-23 MED ORDER — CLOZAPINE 100 MG PO TABS: 100.0000 mg | ORAL_TABLET | ORAL | Status: DC

## 2022-04-23 MED ORDER — ACETAMINOPHEN 650 MG RE SUPP: 650.0000 mg | Freq: Four times a day (QID) | RECTAL | Status: DC | PRN

## 2022-04-23 MED ORDER — SODIUM CHLORIDE 0.9 % IV SOLN: INTRAVENOUS | Status: DC

## 2022-04-23 MED ORDER — SODIUM CHLORIDE 0.9% FLUSH: 3.0000 mL | INTRAVENOUS | Status: DC | PRN

## 2022-04-23 MED ORDER — TAMSULOSIN HCL 0.4 MG PO CAPS
0.4000 mg | ORAL_CAPSULE | Freq: Every day | ORAL | Status: DC
  Administered 2022-04-24 – 2022-04-25 (×2): 0.4 mg via ORAL
  Filled 2022-04-23 (×2): qty 1

## 2022-04-23 MED ORDER — PANTOPRAZOLE SODIUM 40 MG PO TBEC
40.0000 mg | DELAYED_RELEASE_TABLET | Freq: Every day | ORAL | Status: DC
  Administered 2022-04-24 – 2022-04-25 (×2): 40 mg via ORAL
  Filled 2022-04-23 (×2): qty 1

## 2022-04-23 MED ORDER — PIPERACILLIN-TAZOBACTAM 3.375 G IVPB 30 MIN
3.3750 g | Freq: Four times a day (QID) | INTRAVENOUS | Status: DC
  Administered 2022-04-24: 3.375 g via INTRAVENOUS
  Filled 2022-04-23 (×2): qty 50

## 2022-04-23 MED ORDER — SODIUM CHLORIDE 0.9 % IV SOLN: 250.0000 mL | INTRAVENOUS | Status: DC | PRN

## 2022-04-23 MED ORDER — METRONIDAZOLE 500 MG/100ML IV SOLN
500.0000 mg | Freq: Once | INTRAVENOUS | Status: DC
  Administered 2022-04-23: 500 mg via INTRAVENOUS
  Filled 2022-04-23: qty 100

## 2022-04-23 MED ORDER — ONDANSETRON HCL 4 MG PO TABS: 4.0000 mg | ORAL_TABLET | Freq: Four times a day (QID) | ORAL | Status: DC | PRN

## 2022-04-23 MED ORDER — CLOZAPINE 100 MG PO TABS
100.0000 mg | ORAL_TABLET | Freq: Every day | ORAL | Status: DC
  Administered 2022-04-24 – 2022-04-25 (×2): 100 mg via ORAL
  Filled 2022-04-23 (×2): qty 1

## 2022-04-23 MED ORDER — ONDANSETRON HCL 4 MG/2ML IJ SOLN: 4.0000 mg | Freq: Four times a day (QID) | INTRAMUSCULAR | Status: DC | PRN

## 2022-04-23 NOTE — Progress Notes (Signed)
Pharmacy Antibiotic Note  Oscar Elliott is a 62 y.o. male admitted on 04/23/2022 with sepsis.  Pharmacy has been consulted for vancomycin dosing.  Plan: Vancomycin IV 500 mg to complete 1500 mg loading dose. 24-hour vancomycin random level for 2/27.  Also on piperacillin/tazobactam 3.375 grams every 8 hours  Height: '5\' 1"'$  (154.9 cm) Weight: 59 kg (130 lb 1.1 oz) IBW/kg (Calculated) : 52.3  Temp (24hrs), Avg:99.2 F (37.3 C), Min:98.9 F (37.2 C), Max:99.4 F (37.4 C)  Recent Labs  Lab 04/23/22 1327  WBC 8.4  CREATININE 3.20*  LATICACIDVEN 2.9*    Estimated Creatinine Clearance: 17.7 mL/min (A) (by C-G formula based on SCr of 3.2 mg/dL (H)).    Allergies  Allergen Reactions   Penicillins Other (See Comments)    DIZZINESS     Antimicrobials this admission: Vancomycin 2/26 >>  Metronidazole 2/26 >> 2/26 Cefepime 2/26 >> 2/26 Zosyn 2/26 >>  Dose adjustments this admission: N/a  Microbiology results: 2/26 BCx: ordered  Thank you for allowing pharmacy to be a part of this patient's care.  Wynelle Cleveland 04/23/2022 4:42 PM

## 2022-04-23 NOTE — H&P (Addendum)
[facility] History and Physical   Patient: Oscar Elliott [id number]   PCP: Lauretta Grill, NP DOB: [birth year]   DOA: 04/23/2022   DOS: 04/23/2022   DOS: the patient was seen and examined on 04/23/2022   Patient coming from: The patient is coming from a Facility  Chief Complaint: Jerking of arms and right leg  HPI: Oscar Elliott is a 62 y.o. male with Past medical history of schizophrenia, hypertension, hyperlipidemia, diabetes, and peripheral vascular disease status post left AKA who presents to the ED complaining of tremor.  HPI reviewed from ER physician's chart, patient is unable to provide any details, patient is AAO x 1, due to schizophrenia.  As per ED physician patient was having jerking of arms and right leg, so he was brought in to the ED.  Patient had headache around 10 AM, patient also stated having tremors for a long time to the EMS.  Patient was noticed to have elevated to movements of his face, numbness and right leg, s/p Left BKA    ED Course:  CT head negative, CXR negative WBC count normal, elevated lactic acid and procalcitonin Low blood pressure, most likely sepsis Patient was given IV fluid bolus in the ED, started on empiric antibiotics, blood cultures were sent.  [facility] hospitalist consulted for admission and further management as below.  Review of Systems: as mentioned in the history of present illness.  All other systems reviewed and are negative.  Past Medical History:  Diagnosis Date   CAD (coronary artery disease)    a. 10/2013 Lateral STEMI/PCI: LM nl, LAD min irregs, D1 occluded sub branch, LCX min irregs mid, OM large/patent, RCA 138m(2.75x28 Promus DES).   Cholelithiasis    Essential hypertension    History of renal failure    a. 06/2007: in setting of rhabdo post L AKA.   Hyperlipidemia    MI ([place]ocardial infarction) ([place]    Nephrolithiasis    [place]ipheral vascular disease [place]    a. 06/2007: s/p L AKA @ [facility]   Schizophrenia ([place]    Type  2 diabetes mellitus ([place]    Past Surgical History:  Procedure Laterality Date   Above-the-knee amputation Left    COLONOSCOPY WITH PROPOFOL N/A 12/09/2018   fields: nonthrombosed hemorrhoids, decreased anal sphincter tone. poor prep, colonoscopy aborted   FLEXIBLE SIGMOIDOSCOPY  11/17/2019   Procedure: FLEXIBLE SIGMOIDOSCOPY;  Surgeon: CEloise Harman DO;  Location: [facility];  Service: Endoscopy;;   LEFT HEART CATHETERIZATION WITH CORONARY ANGIOGRAM N/A 11/20/2013   Procedure: LEFT HEART CATHETERIZATION WITH CORONARY ANGIOGRAM;  Surgeon: JJettie Booze MD;  [facility];  Service: Cardiovascular;  Laterality: N/A;   Social History:  reports that he has been smoking cigars and cigarettes. He has a 5.00 pack-year smoking history. He has never used smokeless tobacco. He reports that he does not drink alcohol and does not use drugs.  Allergies  Allergen Reactions   Penicillins Other (See Comments)    DIZZINESS      Family history reviewed and not pertinent Family History  Problem Relation Age of Onset   Other Other        no premature CAD.     Prior to Admission medications   Medication Sig Start Date End Date Taking? Authorizing Provider  ARIPiprazole (ABILIFY) 15 MG tablet Take 1 tablet (15 mg total) by mouth daily. 05/04/20  Yes RCloria Spring MD  aspirin EC 81 MG tablet Take 81 mg by mouth daily.   Yes  [provider]  atorvastatin (LIPITOR) 80 MG tablet Take 1 tablet (80 mg total) by mouth daily at 6 PM. 05/31/16  Yes Memon, Jolaine Artist, MD  clonazePAM (KLONOPIN) 0.5 MG tablet Take 1 tablet (0.5 mg total) by mouth at bedtime. 05/04/20  Yes Cloria Spring, MD  clopidogrel (PLAVIX) 75 MG tablet Take 75 mg by mouth daily.   Yes [provider]  cloZAPine (CLOZARIL) 100 MG tablet 100 mg in AM and 500 mg at night Patient taking differently: Take 100-500 mg by mouth See admin instructions. Take 100 mg by mouth in morning and 500 mg at night 03/26/19   Yes Nevada Crane, MD  docusate sodium (COLACE) 100 MG capsule Take 100 mg by mouth 2 (two) times daily.   Yes [provider]  DULoxetine (CYMBALTA) 60 MG capsule Take 2 capsules (120 mg total) by mouth daily. 08/20/19  Yes Norman Clay, MD  gabapentin (NEURONTIN) 100 MG capsule Take 100 mg by mouth 2 (two) times daily.   Yes [provider]  insulin detemir (LEVEMIR) 100 UNIT/ML injection Inject 0.3 mLs (30 Units total) into the skin daily. Patient taking differently: Inject 33 Units into the skin daily. Hold if pt does not eat . 05/31/16  Yes Memon, Jolaine Artist, MD  latanoprost (XALATAN) 0.005 % ophthalmic solution Place 1 drop into both eyes at bedtime.   Yes [provider]  linaclotide (LINZESS) 145 MCG CAPS capsule TAKE 1 CAPSULE BY MOUTH ONCE DAILY BEFORE BREAKFAST. 07/10/21  Yes Mahala Menghini, PA-C  lisinopril (ZESTRIL) 10 MG tablet Take 10 mg by mouth daily. Hold if systolic is less than [id number]   Yes [provider]  metoprolol (LOPRESSOR) 50 MG tablet Take 1 tablet (50 mg total) by mouth 2 (two) times daily. 12/02/13  Yes Imogene Burn, PA-C  omeprazole (PRILOSEC) 40 MG capsule Take 40 mg by mouth 2 (two) times daily.    Yes [provider]  tamsulosin (FLOMAX) 0.4 MG CAPS capsule Take 0.4 mg by mouth daily.   Yes [provider]  acetaminophen (TYLENOL) 500 MG tablet Take 500 mg by mouth every 4 (four) hours as needed for moderate pain.    [provider]  Cholecalciferol (VITAMIN D) 50 MCG (2000 UT) CAPS Take 2,000 Units by mouth once a week.     [provider]  diclofenac Sodium (VOLTAREN) 1 % GEL Apply 2 g topically 4 (four) times daily as needed (pain).  Patient not taking: Reported on 04/23/2022    [provider]  [facility] TEST test strip 1 each by Other route as needed. 07/06/20   [provider]  insulin aspart (NOVOLOG) 100 UNIT/ML injection Inject 2 Units into the skin 3 (three) times daily  before meals. Patient not taking: Reported on 04/23/2022 06/01/16   Kathie Dike, MD  ketoconazole (NIZORAL) 2 % shampoo Apply 1 application topically 2 (two) times a week.    [provider]  nitroGLYCERIN (NITROSTAT) 0.4 MG SL tablet Place 1 tablet (0.4 mg total) under the tongue every 5 (five) minutes x 3 doses as needed for chest pain. 11/23/13   Barrett, Evelene Croon, PA-C  polyethylene glycol (MIRALAX / GLYCOLAX) 17 g packet Take 17 g by mouth daily as needed for moderate constipation.    [provider]    Physical Exam: Vitals:   04/23/22 1322 04/23/22 1323 04/23/22 1442 04/23/22 1500  BP:  95/69 97/61 (!) 95/57  Pulse:  (!) 117 (!) 107 (!) 106  Resp:  18 (!) 24   Temp:  98.9 F (37.2 C) 99.4 F (37.4 C)   TempSrc:  Oral Oral   SpO2:  99% 100% 100%  Weight: 59 kg     Height: '5\' 1"'$  (1.549 m)       General: obtunded and oriented to himself only, does not remember his birthday, not oriented about time and place. Eyes: PERRLA, Conjunctiva normal ENT: Oral Mucosa Clear, moist  Neck: no JVD, no Abnormal Mass Or lumps Cardiovascular: S1 and S2 Present, no Murmur, peripheral pulses symmetrical Respiratory: good respiratory effort, Bilateral Air entry equal and Decreased, no signs of accessory muscle use, Clear to Auscultation, no Crackles, no wheezes Abdomen: Bowel Sound present, Soft and no tenderness, no hernia Skin: no rashes  Extremities: no Pedal edema, no calf tenderness, s/p Left BKA Neurologic: without any new focal findings Gait not checked due to patient safety concerns  Data Reviewed: I have personally reviewed and interpreted labs, imaging as discussed below.  CBC: Recent Labs  Lab 04/23/22 1327  WBC 8.4  NEUTROABS 7.1  HGB 10.5*  HCT 31.0*  MCV 86.6  PLT 89*   Basic Metabolic Panel: Recent Labs  Lab 04/23/22 1327  NA 137  K 3.5  CL 102  CO2 23  GLUCOSE 193*  BUN 56*  CREATININE 3.20*  CALCIUM 8.3*  MG 2.1   GFR: Estimated  Creatinine Clearance: 17.7 mL/min (A) (by C-G formula based on SCr of 3.2 mg/dL (H)). Liver Function Tests: Recent Labs  Lab 04/23/22 1327  AST 45*  ALT 30  ALKPHOS 87  BILITOT 0.8  PROT 6.5  ALBUMIN 3.0*   No results for input(s): "LIPASE", "AMYLASE" in the last 168 hours. No results for input(s): "AMMONIA" in the last 168 hours. Coagulation Profile: No results for input(s): "INR", "PROTIME" in the last 168 hours. Cardiac Enzymes: Recent Labs  Lab 04/23/22 1327  CKTOTAL 491*   BNP (last 3 results) No results for input(s): "PROBNP" in the last 8760 hours. HbA1C: No results for input(s): "HGBA1C" in the last 72 hours. CBG: No results for input(s): "GLUCAP" in the last 168 hours. Lipid Profile: No results for input(s): "CHOL", "HDL", "LDLCALC", "TRIG", "CHOLHDL", "LDLDIRECT" in the last 72 hours. Thyroid Function Tests: No results for input(s): "TSH", "T4TOTAL", "FREET4", "T3FREE", "THYROIDAB" in the last 72 hours. Anemia Panel: No results for input(s): "VITAMINB12", "FOLATE", "FERRITIN", "TIBC", "IRON", "RETICCTPCT" in the last 72 hours. Urine analysis:    Component Value Date/Time   COLORURINE YELLOW 02/27/2020 2334   APPEARANCEUR HAZY (A) 02/27/2020 2334   APPEARANCEUR Clear 07/08/2013 1606   LABSPEC 1.010 02/27/2020 2334   LABSPEC 1.010 07/08/2013 1606   PHURINE 5.0 02/27/2020 2334   GLUCOSEU NEGATIVE 02/27/2020 2334   GLUCOSEU Negative 07/08/2013 1606   HGBUR NEGATIVE 02/27/2020 2334   BILIRUBINUR NEGATIVE 02/27/2020 2334   BILIRUBINUR Negative 07/08/2013 [place] 02/27/2020 2334   PROTEINUR NEGATIVE 02/27/2020 2334   UROBILINOGEN 0.2 02/22/2014 1853   NITRITE POSITIVE (A) 02/27/2020 2334   LEUKOCYTESUR SMALL (A) 02/27/2020 2334   LEUKOCYTESUR 3+ 07/08/2013 1606    Radiological Exams on Admission: CT Head Wo Contrast  Result Date: 04/23/2022 CLINICAL DATA:  Mental status change, unknown cause. Headaches and tremor beginning today. Jerking  of the arms and legs EXAM: CT HEAD WITHOUT CONTRAST TECHNIQUE: Contiguous axial images were obtained from the base of the skull through the vertex without intravenous contrast. RADIATION DOSE REDUCTION: This exam was performed according to the departmental dose-optimization program which  includes automated exposure control, adjustment of the mA and/or kV according to patient size and/or use of iterative reconstruction technique. COMPARISON:  05/30/2016 FINDINGS: Brain: Generalized volume loss without lobar predominance. No focal abnormality seen affecting the brainstem or cerebellum. No evidence of large vessel stroke or advanced small-vessel disease. No cortical or large vessel territory abnormality. No mass, hemorrhage, hydrocephalus or extra-axial collection. Vascular: No abnormal vascular finding. Skull: Chronic plagiocephaly.  No acute finding. Sinuses/Orbits: Clear/normal Other: None IMPRESSION: No acute CT finding. Generalized volume loss without lobar predominance. No evidence of large vessel stroke or advanced small-vessel disease. Electronically Signed   By: Nelson Chimes M.D.   On: 04/23/2022 15:39   DG Chest 2 View  Result Date: 04/23/2022 CLINICAL DATA:  Trauma EXAM: CHEST - 2 VIEW COMPARISON:  CXR 02/27/20 FINDINGS: No pleural effusion. No pneumothorax. Unchanged and normal cardiac and mediastinal contours. No focal airspace opacity. Visualized upper abdomen is notable for colonic gaseous distention. No radiographically apparent displaced rib fractures. Vertebral body heights are maintained. IMPRESSION: 1. No active cardiopulmonary disease. 2. Colonic gaseous distention. Electronically Signed   By: Marin Roberts M.D.   On: 04/23/2022 14:45    I reviewed all nursing notes, pharmacy notes, vitals, pertinent old records.  Assessment/Plan Principal Problem:   Sepsis ([place])   Sepsis, unknown source of infection Evaded lactic acid and procalcitonin Previous count within normal range Empiric  antibiotic vancomycin and Zosyn started, pharmacy consulted for managing antibiotics and trough monitoring. Started NS 125 mill per hour Follow UA and urine culture Follow blood culture    # AKI Due to sepsis, prerenal most likely due to dehydration Continue IV fluid as above Monitor renal functions Check bladder scan to rule out urinary retention Follow renal sonogram Avoid nephrotoxic medications, urinary dose medications    # CAD s/p RCA stent, HTN, HLD, PVD s/p left BKA Hypotensive on admission Continue aspirin and Plavix, hold statin due to elevated CK Held antihypertensive medications due to low blood pressure Will continue to monitor BP and titrate medications accordingly   # IDDM T2 Started NovoLog sliding scale, monitor CBG Continue diabetic diet   Schizophrenia Continued Abilify, clozapine and Cymbalta, Neurontin  BPH, continued Flomax   Nutrition: Carb modified diet DVT Prophylaxis: Subcutaneous Heparin   Advance goals of care discussion: Full code   Consults: none  Family Communication: family was NOT present at bedside, at the time of interview.  Opportunity was given to ask question and all questions were answered satisfactorily.  Pt is AAO x 1  Disposition: Admitted as inpatient, medical telemetry unit Likely to be discharged Facility, in few days.  I have discussed plan of care as described above with RN and patient/family.  Severity of Illness: The appropriate patient status for this patient is INPATIENT. Inpatient status is judged to be reasonable and necessary in order to provide the required intensity of service to ensure the patient's safety. The patient's presenting symptoms, physical exam findings, and initial radiographic and laboratory data in the context of their chronic comorbidities is felt to place them at high risk for further clinical deterioration. Furthermore, it is not anticipated that the patient will be medically stable for  discharge from the hospital within 2 midnights of admission.   * I certify that at the point of admission it is my clinical judgment that the patient will require inpatient hospital care spanning beyond 2 midnights from the point of admission due to high intensity of service, high risk for further deterioration and high  frequency of surveillance required.*   Time spend 75 min Author: Val Riles, MD [facility] 04/23/2022 4:34 PM   To reach On-call, see care teams to locate the attending and reach out to them via [email or web address]. If 7PM-7AM, please contact night-coverage If you still have difficulty reaching the attending provider, please page the [facility] (Director on Call) for [facility] on amion for assistance.

## 2022-04-23 NOTE — ED Triage Notes (Addendum)
Pt arrives via EMS with headache and tremors that started at 10 am. EMS gave 250 cc LR. Pt jerking arms and right leg. Pt alert to self but unable to follow all commands. Per facility paperwork, pt intermittently disoriented and can be verbally abusive.

## 2022-04-23 NOTE — ED Provider Notes (Signed)
I got signout on this patient who is pending admission for AKI, muscle tremors, awaiting CT head prior to admission. See original provider H&P for full initial evaluation.   Lucillie Garfinkel, MD 04/23/22 [phone]

## 2022-04-23 NOTE — Progress Notes (Signed)
PHARMACY -  BRIEF ANTIBIOTIC NOTE   Pharmacy has received consult(s) for vancomycin and cefepime from an ED provider.  The patient's profile has been reviewed for ht/wt/allergies/indication/available labs.    One time order(s) placed for cefepime 2 grams x 1 and vancomycin 1,000 mg x 1  Further antibiotics/pharmacy consults should be ordered by admitting physician if indicated.                       Thank you,  Glean Salvo, PharmD, BCPS Clinical Pharmacist  04/23/2022 3:28 PM

## 2022-04-23 NOTE — Progress Notes (Signed)
Blood cultures drawn before antibiotics given

## 2022-04-23 NOTE — ED Notes (Signed)
Lab adding on blood work to previously collected samples.

## 2022-04-23 NOTE — Progress Notes (Signed)
RN states both blood cultures drawn

## 2022-04-23 NOTE — Progress Notes (Signed)
CODE SEPSIS - PHARMACY COMMUNICATION  **Broad Spectrum Antibiotics should be administered within 1 hour of Sepsis diagnosis**  Time Code Sepsis Called/Page Received: 1531  Antibiotics Ordered: cefepime 2 grams x 1 and vancomycin 1,000 mg x 1  Time of 1st antibiotic administration: [id number]  Additional action taken by pharmacy: none  If necessary, Name of Provider/Nurse Contacted: n/a    Glean Salvo, PharmD, BCPS Clinical Pharmacist  04/23/2022 3:29 PM

## 2022-04-23 NOTE — Progress Notes (Signed)
Pharmacy - Clozapine     This patient's order has been reviewed for prescribing contraindications.   Clozapine REMS enrollment Verified: yes Current Outpatient Monitoring: monthly  Home Regimen:    Dose Adjustments This Admission:   Labs:   02/13/22 [place] 4600 submitted    Plan: Continue with home regimen of 100 mg AM and 500 mg HS Continue with weekly ANC labs while inpatient    Dorothe Pea, PharmD, BCPS Clinical Pharmacist   04/23/2022 5:43 PM

## 2022-04-23 NOTE — Progress Notes (Signed)
Text to RN and attempted phone call x 2 about the times of blood cultures and antibiotics

## 2022-04-23 NOTE — ED Notes (Signed)
Pt to xray

## 2022-04-23 NOTE — ED Provider Notes (Signed)
[facility] Provider Note    Event Date/Time   First MD Initiated Contact with Patient 04/23/22 1341     (approximate)   History   Chief Complaint Tremors   HPI  Oscar Elliott is a 62 y.o. male with past medical history of schizophrenia, hypertension, hyperlipidemia, diabetes, and peripheral vascular disease status post left AKA who presents to the ED complaining of tremor.  History is limited as patient is unable to explain why he is here.  Per EMS, he developed headache and tremor around 10:00 this morning.  When asked how long he has been having tremor, patient states "it has been a long time."  He is unable to specify exactly how long, is regularly having involuntary movements in his face as well as both arms and legs during my assessment.  Patient does not respond when asked if he is having any pain.     Physical Exam   Triage Vital Signs: ED Triage Vitals  Enc Vitals Group     BP 04/23/22 1323 95/69     Pulse Rate 04/23/22 1323 (!) 117     Resp 04/23/22 1323 18     Temp 04/23/22 1323 98.9 F (37.2 C)     Temp Source 04/23/22 1323 Oral     SpO2 04/23/22 1323 99 %     Weight 04/23/22 1322 130 lb 1.1 oz (59 kg)     Height 04/23/22 1322 '5\' 1"'$  (1.549 m)     Head Circumference --      Peak Flow --      Pain Score --      Pain Loc --      Pain Edu? --      Excl. in [place]? --     Most recent vital signs: Vitals:   04/23/22 1442 04/23/22 1500  BP: 97/61 (!) 95/57  Pulse: (!) 107 (!) 106  Resp: (!) 24   Temp: 99.4 F (37.4 C)   SpO2: 100% 100%    Constitutional: Awake and alert, oriented to person and place, but not time or situation. Eyes: Conjunctivae are normal.  Pupils equal, round, and reactive to light bilaterally. Head: Atraumatic. Nose: No congestion/rhinnorhea. Mouth/Throat: Mucous membranes are moist.  Cardiovascular: Tachycardic, regular rhythm. Grossly normal heart sounds.  2+ radial pulses bilaterally. Respiratory: Mildly  tachypneic with normal respiratory effort.  No retractions. Lungs CTAB. Gastrointestinal: Soft and nontender. No distention. Musculoskeletal: No lower extremity tenderness nor edema.  Status post left AKA. Neurologic:  Normal speech and language. No gross focal neurologic deficits are appreciated.    ED Results / Procedures / Treatments   Labs (all labs ordered are listed, but only abnormal results are displayed) Labs Reviewed  LACTIC ACID, PLASMA - Abnormal; Notable for the following components:      Result Value   Lactic Acid, Venous 2.9 (*)    All other components within normal limits  COMPREHENSIVE METABOLIC PANEL - Abnormal; Notable for the following components:   Glucose, Bld 193 (*)    BUN 56 (*)    Creatinine, Ser 3.20 (*)    Calcium 8.3 (*)    Albumin 3.0 (*)    AST 45 (*)    GFR, Estimated 21 (*)    All other components within normal limits  CBC WITH DIFFERENTIAL/PLATELET - Abnormal; Notable for the following components:   RBC 3.58 (*)    Hemoglobin 10.5 (*)    HCT 31.0 (*)    RDW 16.2 (*)  Platelets 89 (*)    Lymphs Abs 0.3 (*)    Abs Immature Granulocytes 0.84 (*)    All other components within normal limits  CK - Abnormal; Notable for the following components:   Total CK 491 (*)    All other components within normal limits  CULTURE, BLOOD (ROUTINE X 2)  CULTURE, BLOOD (ROUTINE X 2)  MAGNESIUM  PROCALCITONIN  LACTIC ACID, PLASMA  URINALYSIS, ROUTINE W REFLEX MICROSCOPIC   RADIOLOGY CT head reviewed and interpreted by me with no hemorrhage or midline shift.  Chest x-ray reviewed and interpreted by me with no infiltrate, edema, or effusion.  PROCEDURES:  Critical Care performed: Yes, see critical care procedure note(s)  .Critical Care  Performed by: Blake Divine, MD Authorized by: Blake Divine, MD   Critical care provider statement:    Critical care time (minutes):  30   Critical care time was exclusive of:  Separately billable procedures and  treating other patients and teaching time   Critical care was necessary to treat or prevent imminent or life-threatening deterioration of the following conditions:  Sepsis   Critical care was time spent personally by me on the following activities:  Development of treatment plan with patient or surrogate, discussions with consultants, evaluation of patient's response to treatment, examination of patient, ordering and review of laboratory studies, ordering and review of radiographic studies, ordering and performing treatments and interventions, pulse oximetry, re-evaluation of patient's condition and review of old charts   I assumed direction of critical care for this patient from another provider in my specialty: no     Care discussed with: admitting provider      MEDICATIONS ORDERED IN ED: Medications  ceFEPIme (MAXIPIME) 2 g in sodium chloride 0.9 % 100 mL IVPB (2 g Intravenous New Bag/Given 04/23/22 1555)  metroNIDAZOLE (FLAGYL) IVPB 500 mg (500 mg Intravenous New Bag/Given 04/23/22 1558)  vancomycin (VANCOCIN) IVPB 1000 mg/200 mL premix (1,000 mg Intravenous New Bag/Given 04/23/22 1603)  lactated ringers bolus 1,000 mL (0 mLs Intravenous Stopped 04/23/22 1557)  diphenhydrAMINE (BENADRYL) injection 25 mg (25 mg Intravenous Given 04/23/22 1421)  lactated ringers bolus 1,000 mL (1,000 mLs Intravenous New Bag/Given 04/23/22 1558)     IMPRESSION / MDM / [place] / ED COURSE  I reviewed the triage vital signs and the nursing notes.                              62 y.o. male with past medical history of schizophrenia, hypertension, hyperlipidemia, diabetes, and peripheral vascular disease who presents to the ED complaining of tremors described by EMS as starting earlier today, but described by patient as going on for multiple months.  Patient's presentation is most consistent with acute presentation with potential threat to life or bodily function.  Differential diagnosis includes, but  is not limited to, intracranial process, electrolyte abnormality, AKI, UTI, pneumonia, medication effect.  Patient chronically ill-appearing but in no acute distress, vital signs remarkable for tachycardia and mild tachypnea.  He has no focal neurologic deficits on exam, but appears to have involuntary muscle movements in his face as well as all 4 extremities.  Low suspicion for seizure as symptoms across the midline with no loss of consciousness.  Labs remarkable for AKI without acute electrolyte abnormality, LFTs are unremarkable.  CK level within normal limits, no significant anemia or leukocytosis noted.  Patient does have elevated lactic acid and procalcitonin is elevated as well.  No clear source of infection at this time, given tachycardia and tachypnea, patient meets sepsis criteria and we will start broad-spectrum antibiotics.  Chest x-ray shows no signs of infection, urinalysis is pending at this time.  Patient turned over to on provider pending CT head results, plan for admission to the hospitalist service.      FINAL CLINICAL IMPRESSION(S) / ED DIAGNOSES   Final diagnoses:  Tremor  Sepsis with acute renal failure without septic shock, due to unspecified organism, unspecified acute renal failure type ([place])  AKI (acute kidney injury) ([place])     Rx / DC Orders   ED Discharge Orders     None        Note:  This document was prepared using Dragon voice recognition software and may include unintentional dictation errors.   Blake Divine, MD 04/23/22 [phone]

## 2022-04-23 NOTE — Progress Notes (Signed)
Elink following code sepsis °

## 2022-04-24 DIAGNOSIS — N179 Acute kidney failure, unspecified: Secondary | ICD-10-CM | POA: Diagnosis not present

## 2022-04-24 DIAGNOSIS — G40309 Generalized idiopathic epilepsy and epileptic syndromes, not intractable, without status epilepticus: Secondary | ICD-10-CM

## 2022-04-24 DIAGNOSIS — R6520 Severe sepsis without septic shock: Secondary | ICD-10-CM | POA: Diagnosis not present

## 2022-04-24 DIAGNOSIS — A419 Sepsis, unspecified organism: Secondary | ICD-10-CM | POA: Diagnosis not present

## 2022-04-24 LAB — BLOOD CULTURE ID PANEL (REFLEXED) - BCID2

## 2022-04-24 LAB — BASIC METABOLIC PANEL
Anion gap: 9 (ref 5–15)
BUN: 51 mg/dL — ABNORMAL HIGH (ref 8–23)
CO2: 21 mmol/L — ABNORMAL LOW (ref 22–32)
Calcium: 7.6 mg/dL — ABNORMAL LOW (ref 8.9–10.3)
Chloride: 109 mmol/L (ref 98–111)
Creatinine, Ser: 2.83 mg/dL — ABNORMAL HIGH (ref 0.61–1.24)
GFR, Estimated: 24 mL/min — ABNORMAL LOW (ref >60)
Glucose, Bld: 173 mg/dL — ABNORMAL HIGH (ref 70–99)
Potassium: 3.7 mmol/L (ref 3.5–5.1)
Sodium: 139 mmol/L (ref 135–145)

## 2022-04-24 LAB — HEMOGLOBIN A1C
Hgb A1c MFr Bld: 6.7 % — ABNORMAL HIGH (ref 4.8–5.6)
Mean Plasma Glucose: 146 mg/dL

## 2022-04-24 LAB — CBC
HCT: 26.3 % — ABNORMAL LOW (ref 39.0–52.0)
Hemoglobin: 9 g/dL — ABNORMAL LOW (ref 13.0–17.0)
MCH: 29 pg (ref 26.0–34.0)
MCHC: 34.2 g/dL (ref 30.0–36.0)
MCV: 84.8 fL (ref 80.0–100.0)
Platelets: 58 10*3/uL — ABNORMAL LOW (ref 150–400)
RBC: 3.1 MIL/uL — ABNORMAL LOW (ref 4.22–5.81)
RDW: 15.8 % — ABNORMAL HIGH (ref 11.5–15.5)
WBC: 7 10*3/uL (ref 4.0–10.5)
nRBC: 0.3 % — ABNORMAL HIGH (ref 0.0–0.2)

## 2022-04-24 LAB — GLUCOSE, CAPILLARY
Glucose-Capillary: 184 mg/dL — ABNORMAL HIGH (ref 70–99)
Glucose-Capillary: 147 mg/dL — ABNORMAL HIGH (ref 70–99)
Glucose-Capillary: 145 mg/dL — ABNORMAL HIGH (ref 70–99)
Glucose-Capillary: 112 mg/dL — ABNORMAL HIGH (ref 70–99)
Glucose-Capillary: 110 mg/dL — ABNORMAL HIGH (ref 70–99)

## 2022-04-24 LAB — IRON AND TIBC
Iron: 10 ug/dL — ABNORMAL LOW (ref 45–182)
Saturation Ratios: 7 % — ABNORMAL LOW (ref 17.9–39.5)
TIBC: 154 ug/dL — ABNORMAL LOW (ref 250–450)
UIBC: 144 ug/dL

## 2022-04-24 LAB — CK: Total CK: 323 U/L (ref 49–397)

## 2022-04-24 LAB — VITAMIN D 25 HYDROXY (VIT D DEFICIENCY, FRACTURES): Vit D, 25-Hydroxy: 57.15 ng/mL (ref 30–100)

## 2022-04-24 LAB — HIV ANTIBODY (ROUTINE TESTING W REFLEX): HIV Screen 4th Generation wRfx: NONREACTIVE

## 2022-04-24 LAB — PHOSPHORUS: Phosphorus: 3.8 mg/dL (ref 2.5–4.6)

## 2022-04-24 LAB — VITAMIN B12: Vitamin B-12: 308 pg/mL (ref 180–914)

## 2022-04-24 LAB — MAGNESIUM: Magnesium: 2 mg/dL (ref 1.7–2.4)

## 2022-04-24 LAB — FOLATE: Folate: 16.6 ng/mL (ref >5.9)

## 2022-04-24 LAB — LACTIC ACID, PLASMA: Lactic Acid, Venous: 1.3 mmol/L (ref 0.5–1.9)

## 2022-04-24 MED ORDER — POLYETHYLENE GLYCOL 3350 17 G PO PACK
17.0000 g | PACK | Freq: Every day | ORAL | Status: DC
  Administered 2022-04-24 – 2022-04-25 (×2): 17 g via ORAL
  Filled 2022-04-24 (×2): qty 1

## 2022-04-24 MED ORDER — SODIUM CHLORIDE 0.9 % IV SOLN
2.0000 g | INTRAVENOUS | Status: DC
  Administered 2022-04-24 – 2022-04-25 (×2): 2 g via INTRAVENOUS
  Filled 2022-04-24 (×2): qty 20

## 2022-04-24 MED ORDER — SODIUM CHLORIDE 0.9 % IV SOLN: INTRAVENOUS | Status: DC

## 2022-04-24 MED ORDER — BISACODYL 5 MG PO TBEC
10.0000 mg | DELAYED_RELEASE_TABLET | Freq: Every day | ORAL | Status: DC
  Administered 2022-04-24: 10 mg via ORAL
  Filled 2022-04-24: qty 2

## 2022-04-24 NOTE — Progress Notes (Signed)
[facility] Progress Note  Patient: Oscar Elliott    [id number]  DOA: 04/23/2022     Date of Service: the patient was seen and examined on 04/24/2022  Chief Complaint  Patient presents with   Tremors   Brief hospital course: Oscar Elliott is a 62 y.o. male with Past medical history of schizophrenia, hypertension, hyperlipidemia, diabetes, and peripheral vascular disease status post left AKA who presents to the ED complaining of tremor.  HPI reviewed from ER physician's chart, patient is unable to provide any details, patient is AAO x 1, due to schizophrenia.  As per ED physician patient was having jerking of arms and right leg, so he was brought in to the ED.  Patient had headache around 10 AM, patient also stated having tremors for a long time to the EMS.  Patient was noticed to have elevated to movements of his face, numbness and right leg, s/p Left BKA     ED Course:  CT head negative, CXR negative WBC count normal, elevated lactic acid and procalcitonin Low blood pressure, most likely sepsis Patient was given IV fluid bolus in the ED, started on empiric antibiotics, blood cultures were sent.   [facility] hospitalist consulted for admission and further management as below.   Assessment and Plan:  Sepsis, unknown source of infection, could be due to UTI Evaded lactic acid and procalcitonin WBC count within normal range Blood culture growing E. coli, follow sensitivity report S/p Vanco and Zosyn, de-escalated to ceftriaxone 2 g IV daily S/p NS 125 ml/hr, decraesed to NS 75 ml/hr UA positive,  Follow urine culture Repeat blood culture in am     # AKI Due to sepsis, prerenal most likely due to dehydration Continue IV fluid as above Monitor renal functions bladder scan 88 PVR  renal sonogram: . Nonobstructing 14 mm left lower pole renal calculus, similar to prior examination. No urinary tract dilation. Normal sonographic parenchymal echogenicity of the kidneys.  Incidentally noted thickened appearance of the right adrenal gland, better characterized on prior CT. recommended to follow-up with PCP as an outpatient for right adrenal gland thickening. Avoid nephrotoxic medications, urinary dose medications Cr 3.2--2.8 gradually improving     # Thrombocytopenia most likely due to sepsis Avoid heparin and Lovenox Monitor CBC daily   # CAD s/p RCA stent, HTN, HLD, PVD s/p left BKA Hypotensive on admission Continue aspirin and Plavix, hold statin due to elevated CK Held antihypertensive medications due to low blood pressure Will continue to monitor BP and titrate medications accordingly     Anemia secondary to iron deficiency, iron level 10, saturation 7% Folic acid level within normal range Patient will need oral iron supplement after resolution of infection.   # IDDM T2 Started NovoLog sliding scale, monitor CBG Continue diabetic diet     Schizophrenia Continued Abilify, clozapine and Cymbalta, Neurontin Patient presented with some involuntary movements, unknown cause Follow EEG to rule out seizures   BPH, continued Flomax     Body mass index is 22.37 kg/m.  Interventions:        Diet: Continue diabetic diet DVT Prophylaxis: SCD, pharmacological prophylaxis contraindicated due to thrombocytopenia    Advance goals of care discussion: Full code  Family Communication: family was not present at bedside, at the time of interview.  The pt provided permission to discuss medical plan with the family. Opportunity was given to ask question and all questions were answered satisfactorily.   Disposition:  Pt is from group home, admitted with sepsis, E. coli  bacteremia, still on IV antibiotics, need to repeat blood cultures, which precludes a safe discharge. Discharge to group home, when clinically stable, may need 2 more days to improve.  Subjective: No significant events overnight, patient stated that he is feeling all right, patient is  AAO x 1, does not know where is he right now.  Denies any complaints, no chest pain or palpitation, no shortness of breath, no abdominal pain.  Physical Exam: General: NAD, lying comfortably Appear in no distress, affect appropriate Eyes: PERRLA ENT: Oral Mucosa Clear, moist  Neck: no JVD,  Cardiovascular: S1 and S2 Present, no Murmur,  Respiratory: good respiratory effort, Bilateral Air entry equal and Decreased, no Crackles, no wheezes Abdomen: Bowel Sound present, Soft and no tenderness,  Skin: no rashes Extremities: no Pedal edema, no calf tenderness Neurologic: without any new focal findings Gait not checked due to patient safety concerns  Vitals:   04/24/22 0459 04/24/22 0550 04/24/22 0804 04/24/22 1155  BP: 97/67  99/68 115/82  Pulse: 94  100 (!) 109  Resp: '20  18 18  '$ Temp: 98.5 F (36.9 C)  98.3 F (36.8 C) 99 F (37.2 C)  TempSrc: Oral  Oral Oral  SpO2: 100%  100% 100%  Weight:  53.7 kg    Height:        Intake/Output Summary (Last 24 hours) at 04/24/2022 1440 Last data filed at 04/24/2022 1136 Gross per 24 hour  Intake 751.24 ml  Output 650 ml  Net 101.24 ml   Filed Weights   04/23/22 1322 04/23/22 2141 04/24/22 0550  Weight: 59 kg 53.7 kg 53.7 kg    Data Reviewed: I have personally reviewed and interpreted daily labs, tele strips, imagings as discussed above. I reviewed all nursing notes, pharmacy notes, vitals, pertinent old records I have discussed plan of care as described above with RN and patient/family.  CBC: Recent Labs  Lab 04/23/22 1327 04/24/22 0326  WBC 8.4 7.0  NEUTROABS 7.1  --   HGB 10.5* 9.0*  HCT 31.0* 26.3*  MCV 86.6 84.8  PLT 89* 58*   Basic Metabolic Panel: Recent Labs  Lab 04/23/22 1327 04/24/22 0326  NA 137 139  K 3.5 3.7  CL 102 109  CO2 23 21*  GLUCOSE 193* 173*  BUN 56* 51*  CREATININE 3.20* 2.83*  CALCIUM 8.3* 7.6*  MG 2.1 2.0  PHOS  --  3.8    Studies: US RENAL  Result Date: 04/23/2022 CLINICAL DATA:   Acute kidney injury EXAM: RENAL / URINARY TRACT ULTRASOUND COMPLETE COMPARISON:  Ultrasound dated 05/14/2017, CT abdomen and pelvis dated 02/23/2014 FINDINGS: Right Kidney: Length = 10.9 cm AP renal pelvis diameter = <10 mm Normal parenchymal echogenicity with preserved corticomedullary differentiation. No urinary tract dilation or shadowing calculi. The ureter is not seen. Left Kidney: Length = 11.5 cm AP renal pelvis diameter = <10 mm Normal parenchymal echogenicity with preserved corticomedullary differentiation. No urinary tract dilation. Shadowing lower pole stone measures 14 mm, similar to prior examination. The ureter is not seen. Bladder: Appears normal for degree of bladder distention. Other: Incidentally noted thickened appearance of the right adrenal gland, better characterized on prior CT. IMPRESSION: 1. Nonobstructing 14 mm left lower pole renal calculus, similar to prior examination. 2. No urinary tract dilation. Normal sonographic parenchymal echogenicity of the kidneys. 3. Incidentally noted thickened appearance of the right adrenal gland, better characterized on prior CT. Electronically Signed   By: Darrin Nipper M.D.   On: 04/23/2022 17:13   CT  Head Wo Contrast  Result Date: 04/23/2022 CLINICAL DATA:  Mental status change, unknown cause. Headaches and tremor beginning today. Jerking of the arms and legs EXAM: CT HEAD WITHOUT CONTRAST TECHNIQUE: Contiguous axial images were obtained from the base of the skull through the vertex without intravenous contrast. RADIATION DOSE REDUCTION: This exam was performed according to the departmental dose-optimization program which includes automated exposure control, adjustment of the mA and/or kV according to patient size and/or use of iterative reconstruction technique. COMPARISON:  05/30/2016 FINDINGS: Brain: Generalized volume loss without lobar predominance. No focal abnormality seen affecting the brainstem or cerebellum. No evidence of large vessel stroke  or advanced small-vessel disease. No cortical or large vessel territory abnormality. No mass, hemorrhage, hydrocephalus or extra-axial collection. Vascular: No abnormal vascular finding. Skull: Chronic plagiocephaly.  No acute finding. Sinuses/Orbits: Clear/normal Other: None IMPRESSION: No acute CT finding. Generalized volume loss without lobar predominance. No evidence of large vessel stroke or advanced small-vessel disease. Electronically Signed   By: Nelson Chimes M.D.   On: 04/23/2022 15:39    Scheduled Meds:  ARIPiprazole  15 mg Oral Daily   aspirin EC  81 mg Oral Daily   bisacodyl  10 mg Oral QHS   clopidogrel  75 mg Oral Daily   cloZAPine  100 mg Oral Daily   cloZAPine  500 mg Oral QHS   DULoxetine  120 mg Oral Daily   gabapentin  100 mg Oral BID   insulin aspart  0-15 Units Subcutaneous TID WC   latanoprost  1 drop Both Eyes QHS   pantoprazole  40 mg Oral Daily   polyethylene glycol  17 g Oral Daily   sodium chloride flush  3 mL Intravenous Q12H   tamsulosin  0.4 mg Oral Daily   Continuous Infusions:  sodium chloride     sodium chloride 75 mL/hr at 04/24/22 0833   cefTRIAXone (ROCEPHIN)  IV 2 g (04/24/22 1321)   PRN Meds: sodium chloride, acetaminophen **OR** acetaminophen, ondansetron **OR** ondansetron (ZOFRAN) IV, sodium chloride flush  Time spent: 35 minutes  Author: Val Riles. MD [facility] 04/24/2022 2:40 PM  To reach On-call, see care teams to locate the attending and reach out to them via [email or web address]. If 7PM-7AM, please contact night-coverage If you still have difficulty reaching the attending provider, please page the [facility] (Director on Call) for [facility] on amion for assistance.

## 2022-04-24 NOTE — Progress Notes (Signed)
EEG complete - results pending 

## 2022-04-24 NOTE — TOC Initial Note (Signed)
Transition of Care [facility]) - Initial/Assessment Note    Patient Details  Name: Oscar Elliott MRN: [id number] Date of Birth: [birth year]  [facility] CM/SW Contact:    Candie Chroman, LCSW Phone Number: 04/24/2022, 12:57 PM  Clinical Narrative:   Patient only oriented to self. CSW called Robet Leu. He confirmed patient is a resident at his group home, [facility]. Patient is his own guardian. No home health prior to admission. He uses a prosthetic, RW, and wheelchair. No further concerns. CSW encouraged [name] to contact CSW as needed. CSW will continue to follow patient for support and facilitate return to [facility] when stable.               Expected Discharge Plan: Group Home Barriers to Discharge: Continued Medical Work up   Patient Goals and CMS Choice            Expected Discharge Plan and Services     Post Acute Care Choice: NA Living arrangements for the past 2 months: Group Home                                      Prior Living Arrangements/Services Living arrangements for the past 2 months: Group Home Lives with:: Facility Resident Patient language and need for interpreter reviewed:: Yes Do you feel safe going back to the place where you live?: Yes      Need for Family Participation in Patient Care: Yes (Comment) Care giver support system in place?: Yes (comment) Current home services: DME Criminal Activity/Legal Involvement Pertinent to Current Situation/Hospitalization: No - Comment as needed  Activities of Daily Living      Permission Sought/Granted Permission sought to share information with : Facility Sport and exercise psychologist    Share Information with NAME: Robet Leu  Permission granted to share info w AGENCY: [facility]  Permission granted to share info w Relationship: Group home owner/director  Permission granted to share info w Contact Information: [phone]  Emotional  Assessment Appearance:: Appears stated age Attitude/Demeanor/Rapport: Unable to Assess Affect (typically observed): Unable to Assess Orientation: : Oriented to Self Alcohol / Substance Use: Not Applicable Psych Involvement: No (comment)  Admission diagnosis:  Tremor [R25.1] AKI (acute kidney injury) ([place]) [N17.9] Sepsis ([place]) [A41.9] Sepsis with acute renal failure without septic shock, due to unspecified organism, unspecified acute renal failure type ([place]) [A41.9, R65.20, N17.9] Patient Active Problem List   Diagnosis Date Noted   Sepsis ([place]) 04/23/2022   Constipation 06/16/2019   Polypharmacy 03/21/2018   Hypoglycemia 05/30/2016   Type 2 diabetes mellitus with circulatory disorder ([place]) 10/25/2015   Encounter for screening colonoscopy 04/21/2014   Cholelithiases 04/21/2014   Hemorrhoids 04/21/2014   Elevated troponin 02/24/2014   Nausea and vomiting 02/23/2014   Abdominal pain    C. difficile colitis    Hypokalemia    Vomiting and diarrhea 02/22/2014   CAD -S/P RCA DES Sept 2015 12/02/2013   Hyperlipidemia 12/02/2013   Tobacco abuse    Peripheral vascular disease ([place])    Schizophrenia ([place])    Essential hypertension    PCP:  Lauretta Grill, NP Pharmacy:   [facility] [place] [address] [place] [address] Phone: [phone] Fax: [phone]     Social Determinants of Health (SDOH) Social History: SDOH Screenings   Tobacco Use: High Risk (04/23/2022)   SDOH Interventions:  Readmission Risk Interventions     No data to display

## 2022-04-24 NOTE — Progress Notes (Signed)
PHARMACY - PHYSICIAN COMMUNICATION CRITICAL VALUE ALERT - BLOOD CULTURE IDENTIFICATION (BCID)  Oscar Elliott is an 62 y.o. male who presented to [facility] on 04/23/2022 with a chief complaint of jerking of arms and right leg  Assessment:  Blood cultures from 2/26 with GNR in 2/2 sets, BCID detects E coli.  Source not clear, UA with pyruria but no urine cx sent.    Name of physician (or Provider) Contacted: Dr Edd Fabian  Current antibiotics: vancomycin and piperacillin/tazobactam  Changes to prescribed antibiotics recommended:  Recommendations accepted by provider  Results for orders placed or performed during the hospital encounter of 04/23/22  Blood Culture ID Panel (Reflexed) (Collected: 04/23/2022  3:54 PM)  Result Value Ref Range   Enterococcus faecalis NOT DETECTED NOT DETECTED   Enterococcus Faecium NOT DETECTED NOT DETECTED   Listeria monocytogenes NOT DETECTED NOT DETECTED   Staphylococcus species NOT DETECTED NOT DETECTED   Staphylococcus aureus (BCID) NOT DETECTED NOT DETECTED   Staphylococcus epidermidis NOT DETECTED NOT DETECTED   Staphylococcus lugdunensis NOT DETECTED NOT DETECTED   Streptococcus species NOT DETECTED NOT DETECTED   Streptococcus agalactiae NOT DETECTED NOT DETECTED   Streptococcus pneumoniae NOT DETECTED NOT DETECTED   Streptococcus pyogenes NOT DETECTED NOT DETECTED   A.calcoaceticus-baumannii NOT DETECTED NOT DETECTED   Bacteroides fragilis NOT DETECTED NOT DETECTED   Enterobacterales DETECTED (A) NOT DETECTED   Enterobacter cloacae complex NOT DETECTED NOT DETECTED   Escherichia coli DETECTED (A) NOT DETECTED   Klebsiella aerogenes NOT DETECTED NOT DETECTED   Klebsiella oxytoca NOT DETECTED NOT DETECTED   Klebsiella pneumoniae NOT DETECTED NOT DETECTED   Proteus species NOT DETECTED NOT DETECTED   Salmonella species NOT DETECTED NOT DETECTED   Serratia marcescens NOT DETECTED NOT DETECTED   Haemophilus influenzae NOT DETECTED NOT DETECTED    Neisseria meningitidis NOT DETECTED NOT DETECTED   Pseudomonas aeruginosa NOT DETECTED NOT DETECTED   Stenotrophomonas maltophilia NOT DETECTED NOT DETECTED   Candida albicans NOT DETECTED NOT DETECTED   Candida auris NOT DETECTED NOT DETECTED   Candida glabrata NOT DETECTED NOT DETECTED   Candida krusei NOT DETECTED NOT DETECTED   Candida parapsilosis NOT DETECTED NOT DETECTED   Candida tropicalis NOT DETECTED NOT DETECTED   Cryptococcus neoformans/gattii NOT DETECTED NOT DETECTED   CTX-M ESBL NOT DETECTED NOT DETECTED   Carbapenem resistance IMP NOT DETECTED NOT DETECTED   Carbapenem resistance KPC NOT DETECTED NOT DETECTED   Carbapenem resistance NDM NOT DETECTED NOT DETECTED   Carbapenem resist OXA 48 LIKE NOT DETECTED NOT DETECTED   Carbapenem resistance VIM NOT DETECTED NOT DETECTED    Doreene Eland, PharmD, BCPS, BCIDP Work Cell: [phone] 04/24/2022 8:08 AM

## 2022-04-25 ENCOUNTER — Inpatient Hospital Stay: Payer: Medicare (Managed Care)

## 2022-04-25 ENCOUNTER — Inpatient Hospital Stay (HOSPITAL_COMMUNITY): Payer: Medicare (Managed Care)

## 2022-04-25 ENCOUNTER — Inpatient Hospital Stay (HOSPITAL_COMMUNITY)
Admit: 2022-04-25 | Discharge: 2022-05-03 | DRG: 871 | Disposition: A | Discharge status: Skilled Nursing Facility | Payer: Medicare (Managed Care) | Source: Ambulatory Visit | Attending: Internal Medicine | Admitting: Internal Medicine

## 2022-04-25 ENCOUNTER — Encounter (HOSPITAL_COMMUNITY): Payer: Self-pay

## 2022-04-25 DIAGNOSIS — A4151 Sepsis due to Escherichia coli [E. coli]: Principal | ICD-10-CM | POA: Diagnosis present

## 2022-04-25 DIAGNOSIS — Z955 Presence of coronary angioplasty implant and graft: Secondary | ICD-10-CM

## 2022-04-25 DIAGNOSIS — D696 Thrombocytopenia, unspecified: Secondary | ICD-10-CM | POA: Diagnosis present

## 2022-04-25 DIAGNOSIS — R401 Stupor: Secondary | ICD-10-CM

## 2022-04-25 DIAGNOSIS — D638 Anemia in other chronic diseases classified elsewhere: Secondary | ICD-10-CM | POA: Diagnosis present

## 2022-04-25 DIAGNOSIS — R569 Unspecified convulsions: Secondary | ICD-10-CM

## 2022-04-25 DIAGNOSIS — B9620 Unspecified Escherichia coli [E. coli] as the cause of diseases classified elsewhere: Secondary | ICD-10-CM | POA: Diagnosis present

## 2022-04-25 DIAGNOSIS — Z781 Physical restraint status: Secondary | ICD-10-CM

## 2022-04-25 DIAGNOSIS — E785 Hyperlipidemia, unspecified: Secondary | ICD-10-CM | POA: Diagnosis present

## 2022-04-25 DIAGNOSIS — I6200 Nontraumatic subdural hemorrhage, unspecified: Secondary | ICD-10-CM | POA: Diagnosis not present

## 2022-04-25 DIAGNOSIS — I10 Essential (primary) hypertension: Secondary | ICD-10-CM | POA: Diagnosis present

## 2022-04-25 DIAGNOSIS — R6520 Severe sepsis without septic shock: Secondary | ICD-10-CM | POA: Diagnosis present

## 2022-04-25 DIAGNOSIS — S065XAA Traumatic subdural hemorrhage with loss of consciousness status unknown, initial encounter: Secondary | ICD-10-CM | POA: Diagnosis not present

## 2022-04-25 DIAGNOSIS — N390 Urinary tract infection, site not specified: Secondary | ICD-10-CM | POA: Diagnosis present

## 2022-04-25 DIAGNOSIS — G40901 Epilepsy, unspecified, not intractable, with status epilepticus: Secondary | ICD-10-CM

## 2022-04-25 DIAGNOSIS — Z794 Long term (current) use of insulin: Secondary | ICD-10-CM

## 2022-04-25 DIAGNOSIS — E119 Type 2 diabetes mellitus without complications: Secondary | ICD-10-CM | POA: Diagnosis not present

## 2022-04-25 DIAGNOSIS — R5381 Other malaise: Secondary | ICD-10-CM | POA: Insufficient documentation

## 2022-04-25 DIAGNOSIS — F05 Delirium due to known physiological condition: Secondary | ICD-10-CM | POA: Diagnosis present

## 2022-04-25 DIAGNOSIS — R251 Tremor, unspecified: Secondary | ICD-10-CM | POA: Diagnosis not present

## 2022-04-25 DIAGNOSIS — Z8669 Personal history of other diseases of the nervous system and sense organs: Principal | ICD-10-CM

## 2022-04-25 DIAGNOSIS — Z89612 Acquired absence of left leg above knee: Secondary | ICD-10-CM

## 2022-04-25 DIAGNOSIS — N179 Acute kidney failure, unspecified: Secondary | ICD-10-CM

## 2022-04-25 DIAGNOSIS — F200 Paranoid schizophrenia: Secondary | ICD-10-CM | POA: Diagnosis present

## 2022-04-25 DIAGNOSIS — I252 Old myocardial infarction: Secondary | ICD-10-CM

## 2022-04-25 DIAGNOSIS — I2510 Atherosclerotic heart disease of native coronary artery without angina pectoris: Secondary | ICD-10-CM | POA: Diagnosis present

## 2022-04-25 DIAGNOSIS — R1312 Dysphagia, oropharyngeal phase: Secondary | ICD-10-CM | POA: Diagnosis not present

## 2022-04-25 DIAGNOSIS — E440 Moderate protein-calorie malnutrition: Secondary | ICD-10-CM | POA: Diagnosis present

## 2022-04-25 DIAGNOSIS — F209 Schizophrenia, unspecified: Secondary | ICD-10-CM | POA: Diagnosis not present

## 2022-04-25 DIAGNOSIS — Z6824 Body mass index (BMI) 24.0-24.9, adult: Secondary | ICD-10-CM

## 2022-04-25 DIAGNOSIS — N200 Calculus of kidney: Secondary | ICD-10-CM | POA: Diagnosis present

## 2022-04-25 DIAGNOSIS — D649 Anemia, unspecified: Secondary | ICD-10-CM | POA: Insufficient documentation

## 2022-04-25 DIAGNOSIS — I739 Peripheral vascular disease, unspecified: Secondary | ICD-10-CM | POA: Diagnosis not present

## 2022-04-25 DIAGNOSIS — E1151 Type 2 diabetes mellitus with diabetic peripheral angiopathy without gangrene: Secondary | ICD-10-CM | POA: Diagnosis present

## 2022-04-25 DIAGNOSIS — E870 Hyperosmolality and hypernatremia: Secondary | ICD-10-CM | POA: Diagnosis present

## 2022-04-25 DIAGNOSIS — N170 Acute kidney failure with tubular necrosis: Secondary | ICD-10-CM | POA: Diagnosis present

## 2022-04-25 DIAGNOSIS — E1165 Type 2 diabetes mellitus with hyperglycemia: Secondary | ICD-10-CM | POA: Diagnosis present

## 2022-04-25 DIAGNOSIS — E876 Hypokalemia: Secondary | ICD-10-CM | POA: Diagnosis present

## 2022-04-25 DIAGNOSIS — G9341 Metabolic encephalopathy: Secondary | ICD-10-CM | POA: Diagnosis present

## 2022-04-25 DIAGNOSIS — Z5320 Procedure and treatment not carried out because of patient's decision for unspecified reasons: Secondary | ICD-10-CM | POA: Diagnosis not present

## 2022-04-25 DIAGNOSIS — G9340 Encephalopathy, unspecified: Secondary | ICD-10-CM

## 2022-04-25 DIAGNOSIS — Z9861 Coronary angioplasty status: Secondary | ICD-10-CM | POA: Diagnosis not present

## 2022-04-25 DIAGNOSIS — E872 Acidosis, unspecified: Secondary | ICD-10-CM | POA: Diagnosis present

## 2022-04-25 DIAGNOSIS — R4182 Altered mental status, unspecified: Secondary | ICD-10-CM | POA: Insufficient documentation

## 2022-04-25 DIAGNOSIS — G40409 Other generalized epilepsy and epileptic syndromes, not intractable, without status epilepticus: Secondary | ICD-10-CM | POA: Diagnosis present

## 2022-04-25 DIAGNOSIS — G40909 Epilepsy, unspecified, not intractable, without status epilepticus: Secondary | ICD-10-CM | POA: Diagnosis not present

## 2022-04-25 DIAGNOSIS — Z79899 Other long term (current) drug therapy: Secondary | ICD-10-CM

## 2022-04-25 DIAGNOSIS — F17290 Nicotine dependence, other tobacco product, uncomplicated: Secondary | ICD-10-CM | POA: Diagnosis present

## 2022-04-25 DIAGNOSIS — I5021 Acute systolic (congestive) heart failure: Secondary | ICD-10-CM | POA: Diagnosis not present

## 2022-04-25 HISTORY — DX: Chronic kidney disease, unspecified: N18.9

## 2022-04-25 LAB — GLUCOSE, CAPILLARY
Glucose-Capillary: 89 mg/dL (ref 70–99)
Glucose-Capillary: 125 mg/dL — ABNORMAL HIGH (ref 70–99)
Glucose-Capillary: 116 mg/dL — ABNORMAL HIGH (ref 70–99)
Glucose-Capillary: 126 mg/dL — ABNORMAL HIGH (ref 70–99)

## 2022-04-25 LAB — BLOOD GAS, ARTERIAL
Acid-base deficit: 11.2 mmol/L — ABNORMAL HIGH (ref 0.0–2.0)
Bicarbonate: 13.9 mmol/L — ABNORMAL LOW (ref 20.0–28.0)
O2 Saturation: 97.8 %
Patient temperature: 37
pCO2 arterial: 29 mmHg — ABNORMAL LOW (ref 32–48)
pH, Arterial: 7.29 — ABNORMAL LOW (ref 7.35–7.45)
pO2, Arterial: 98 mmHg (ref 83–108)

## 2022-04-25 LAB — BASIC METABOLIC PANEL
Anion gap: 10 (ref 5–15)
BUN: 44 mg/dL — ABNORMAL HIGH (ref 8–23)
CO2: 20 mmol/L — ABNORMAL LOW (ref 22–32)
Calcium: 7.8 mg/dL — ABNORMAL LOW (ref 8.9–10.3)
Chloride: 119 mmol/L — ABNORMAL HIGH (ref 98–111)
Creatinine, Ser: 2.21 mg/dL — ABNORMAL HIGH (ref 0.61–1.24)
GFR, Estimated: 33 mL/min — ABNORMAL LOW (ref >60)
Glucose, Bld: 104 mg/dL — ABNORMAL HIGH (ref 70–99)
Potassium: 3.1 mmol/L — ABNORMAL LOW (ref 3.5–5.1)
Sodium: 149 mmol/L — ABNORMAL HIGH (ref 135–145)

## 2022-04-25 LAB — URINE DRUG SCREEN, QUALITATIVE (ARMC ONLY)
Amphetamines, Ur Screen: NOT DETECTED
Barbiturates, Ur Screen: NOT DETECTED
Benzodiazepine, Ur Scrn: NOT DETECTED
Cannabinoid 50 Ng, Ur ~~LOC~~: NOT DETECTED
Cocaine Metabolite,Ur ~~LOC~~: NOT DETECTED
MDMA (Ecstasy)Ur Screen: NOT DETECTED
Methadone Scn, Ur: NOT DETECTED
Opiate, Ur Screen: NOT DETECTED
Phencyclidine (PCP) Ur S: NOT DETECTED
Tricyclic, Ur Screen: POSITIVE — AB

## 2022-04-25 LAB — PROTIME-INR
INR: 1.3 — ABNORMAL HIGH (ref 0.8–1.2)
Prothrombin Time: 15.9 seconds — ABNORMAL HIGH (ref 11.4–15.2)

## 2022-04-25 LAB — CBC
HCT: 27.4 % — ABNORMAL LOW (ref 39.0–52.0)
Hemoglobin: 9.2 g/dL — ABNORMAL LOW (ref 13.0–17.0)
MCH: 29 pg (ref 26.0–34.0)
MCHC: 33.6 g/dL (ref 30.0–36.0)
MCV: 86.4 fL (ref 80.0–100.0)
Platelets: 73 10*3/uL — ABNORMAL LOW (ref 150–400)
RBC: 3.17 MIL/uL — ABNORMAL LOW (ref 4.22–5.81)
RDW: 15.9 % — ABNORMAL HIGH (ref 11.5–15.5)
WBC: 7.1 10*3/uL (ref 4.0–10.5)
nRBC: 0.3 % — ABNORMAL HIGH (ref 0.0–0.2)

## 2022-04-25 LAB — ETHANOL: Alcohol, Ethyl (B): <10 mg/dL (ref <10)

## 2022-04-25 LAB — FIBRINOGEN: Fibrinogen: 763 mg/dL — ABNORMAL HIGH (ref 210–475)

## 2022-04-25 LAB — PHOSPHORUS: Phosphorus: 2.6 mg/dL (ref 2.5–4.6)

## 2022-04-25 LAB — MAGNESIUM: Magnesium: 2.5 mg/dL — ABNORMAL HIGH (ref 1.7–2.4)

## 2022-04-25 LAB — MRSA NEXT GEN BY PCR, NASAL: MRSA by PCR Next Gen: NOT DETECTED

## 2022-04-25 LAB — APTT: aPTT: 34 seconds (ref 24–36)

## 2022-04-25 LAB — CK: Total CK: 233 U/L (ref 49–397)

## 2022-04-25 MED ORDER — LORAZEPAM 2 MG/ML IJ SOLN: 2.0000 mg | INTRAMUSCULAR | Status: AC

## 2022-04-25 MED ORDER — ORAL CARE MOUTH RINSE: 15.0000 mL | OROMUCOSAL | Status: DC | PRN

## 2022-04-25 MED ORDER — VALPROATE SODIUM 100 MG/ML IV SOLN
2000.0000 mg | Freq: Once | INTRAVENOUS | Status: AC
  Administered 2022-04-25: 2000 mg via INTRAVENOUS
  Filled 2022-04-25: qty 20

## 2022-04-25 MED ORDER — ONDANSETRON HCL 4 MG/2ML IJ SOLN
4.0000 mg | Freq: Four times a day (QID) | INTRAMUSCULAR | Status: DC | PRN
  Administered 2022-04-26 – 2022-04-30 (×2): 4 mg via INTRAVENOUS
  Filled 2022-04-25 (×2): qty 2

## 2022-04-25 MED ORDER — THIAMINE HCL 100 MG/ML IJ SOLN
500.0000 mg | INTRAVENOUS | Status: DC
  Administered 2022-04-25: 500 mg via INTRAVENOUS
  Filled 2022-04-25: qty 5

## 2022-04-25 MED ORDER — CYANOCOBALAMIN 1000 MCG PO TABS: 1000.0000 ug | ORAL_TABLET | Freq: Every day | ORAL | Status: AC

## 2022-04-25 MED ORDER — SODIUM CHLORIDE 0.9 % IV SOLN
2.0000 g | INTRAVENOUS | Status: AC
  Administered 2022-04-26 – 2022-05-02 (×7): 2 g via INTRAVENOUS
  Filled 2022-04-25 (×7): qty 20

## 2022-04-25 MED ORDER — LORAZEPAM 2 MG/ML IJ SOLN: 1.0000 mg | INTRAMUSCULAR | Status: DC | PRN

## 2022-04-25 MED ORDER — POLYETHYLENE GLYCOL 3350 17 G PO PACK: 17.0000 g | PACK | Freq: Every day | ORAL | Status: DC | PRN

## 2022-04-25 MED ORDER — OLANZAPINE 10 MG IM SOLR
5.0000 mg | Freq: Once | INTRAMUSCULAR | Status: AC | PRN
  Administered 2022-04-27: 5 mg via INTRAMUSCULAR
  Filled 2022-04-25: qty 10

## 2022-04-25 MED ORDER — VITAMIN B-12 1000 MCG PO TABS
1000.0000 ug | ORAL_TABLET | Freq: Every day | ORAL | Status: DC
  Filled 2022-04-25: qty 1

## 2022-04-25 MED ORDER — ASPIRIN 81 MG PO CHEW: 81.0000 mg | CHEWABLE_TABLET | Freq: Every day | ORAL | Status: DC

## 2022-04-25 MED ORDER — THIAMINE HCL 100 MG/ML IJ SOLN: 500.0000 mg | INTRAVENOUS | Status: DC

## 2022-04-25 MED ORDER — CLONAZEPAM 0.5 MG PO TBDP: 0.5000 mg | ORAL_TABLET | Freq: Two times a day (BID) | ORAL | Status: DC

## 2022-04-25 MED ORDER — RINGERS IV SOLN: INTRAVENOUS | Status: DC

## 2022-04-25 MED ORDER — VALPROATE SODIUM 100 MG/ML IV SOLN
500.0000 mg | Freq: Two times a day (BID) | INTRAVENOUS | Status: DC
  Filled 2022-04-25: qty 5

## 2022-04-25 MED ORDER — SODIUM CHLORIDE 0.9 % IV SOLN: 2.0000 g | INTRAVENOUS | Status: DC

## 2022-04-25 MED ORDER — CLOZAPINE 100 MG PO TABS
100.0000 mg | ORAL_TABLET | Freq: Every day | ORAL | Status: DC
  Administered 2022-04-26 – 2022-05-03 (×6): 100 mg via ORAL
  Filled 2022-04-25 (×8): qty 1

## 2022-04-25 MED ORDER — VALPROATE SODIUM 100 MG/ML IV SOLN: 500.0000 mg | Freq: Two times a day (BID) | INTRAVENOUS | Status: DC

## 2022-04-25 MED ORDER — POTASSIUM CHLORIDE CRYS ER 20 MEQ PO TBCR
40.0000 meq | EXTENDED_RELEASE_TABLET | Freq: Once | ORAL | Status: DC
  Filled 2022-04-25: qty 2

## 2022-04-25 MED ORDER — POTASSIUM CHLORIDE 10 MEQ/100ML IV SOLN
10.0000 meq | INTRAVENOUS | Status: DC
  Administered 2022-04-25: 10 meq via INTRAVENOUS
  Filled 2022-04-25 (×4): qty 100

## 2022-04-25 MED ORDER — CHLORHEXIDINE GLUCONATE CLOTH 2 % EX PADS
6.0000 | MEDICATED_PAD | Freq: Every day | CUTANEOUS | Status: DC
  Administered 2022-04-25 – 2022-05-02 (×8): 6 via TOPICAL

## 2022-04-25 MED ORDER — CLOZAPINE 100 MG PO TABS
500.0000 mg | ORAL_TABLET | Freq: Every day | ORAL | Status: DC
  Filled 2022-04-25 (×3): qty 5

## 2022-04-25 MED ORDER — VALPROATE SODIUM 100 MG/ML IV SOLN
500.0000 mg | Freq: Two times a day (BID) | INTRAVENOUS | Status: DC
  Administered 2022-04-25 – 2022-04-27 (×4): 500 mg via INTRAVENOUS
  Filled 2022-04-25 (×6): qty 5

## 2022-04-25 MED ORDER — DOCUSATE SODIUM 100 MG PO CAPS: 100.0000 mg | ORAL_CAPSULE | Freq: Two times a day (BID) | ORAL | Status: DC | PRN

## 2022-04-25 MED ORDER — HEPARIN SODIUM (PORCINE) 5000 UNIT/ML IJ SOLN
5000.0000 [IU] | Freq: Three times a day (TID) | INTRAMUSCULAR | Status: DC
  Administered 2022-04-26 – 2022-04-28 (×6): 5000 [IU] via SUBCUTANEOUS
  Filled 2022-04-25 (×7): qty 1

## 2022-04-25 MED ORDER — CLOPIDOGREL BISULFATE 75 MG PO TABS
75.0000 mg | ORAL_TABLET | Freq: Every day | ORAL | Status: DC
  Administered 2022-04-26: 75 mg

## 2022-04-25 MED ORDER — STERILE WATER FOR INJECTION IV SOLN
INTRAVENOUS | Status: DC
  Filled 2022-04-25: qty 150

## 2022-04-25 MED ORDER — LACTATED RINGERS IV SOLN: INTRAVENOUS | Status: DC

## 2022-04-25 MED ORDER — CHLORHEXIDINE GLUCONATE CLOTH 2 % EX PADS: 6.0000 | MEDICATED_PAD | Freq: Every day | CUTANEOUS | Status: DC

## 2022-04-25 MED ORDER — POTASSIUM CHLORIDE 10 MEQ/100ML IV SOLN
10.0000 meq | INTRAVENOUS | Status: AC
  Administered 2022-04-25 (×2): 10 meq via INTRAVENOUS

## 2022-04-25 MED ORDER — LORAZEPAM 2 MG/ML IJ SOLN
INTRAMUSCULAR | Status: AC
  Administered 2022-04-25: 2 mg via INTRAVENOUS
  Filled 2022-04-25: qty 1

## 2022-04-25 MED ORDER — INSULIN ASPART 100 UNIT/ML IJ SOLN
0.0000 [IU] | INTRAMUSCULAR | Status: DC
  Administered 2022-04-25 – 2022-04-26 (×2): 1 [IU] via SUBCUTANEOUS
  Administered 2022-04-27: 2 [IU] via SUBCUTANEOUS
  Administered 2022-04-27 – 2022-04-28 (×2): 1 [IU] via SUBCUTANEOUS
  Administered 2022-04-28 (×2): 2 [IU] via SUBCUTANEOUS
  Administered 2022-04-28: 1 [IU] via SUBCUTANEOUS
  Administered 2022-04-28: 2 [IU] via SUBCUTANEOUS
  Administered 2022-04-28 – 2022-04-30 (×9): 1 [IU] via SUBCUTANEOUS
  Administered 2022-04-30 (×2): 2 [IU] via SUBCUTANEOUS
  Administered 2022-05-01 – 2022-05-03 (×6): 1 [IU] via SUBCUTANEOUS

## 2022-04-25 MED ORDER — THIAMINE HCL 100 MG/ML IJ SOLN
100.0000 mg | Freq: Every day | INTRAMUSCULAR | Status: DC
  Administered 2022-04-26 – 2022-04-27 (×2): 100 mg via INTRAVENOUS
  Filled 2022-04-25 (×2): qty 2

## 2022-04-25 NOTE — Progress Notes (Signed)
eLink Physician-Brief Progress Note Patient Name: Oscar Elliott DOB: [birth year] MRN: [id number]   Date of Service  04/25/2022  HPI/Events of Note  Patient needs a PRN order for anti-seizure medications, he also needs an order for Posey restraints to keep him from falling out of bed.  eICU Interventions  Orders entered.        Kerry Kass Elenie Coven 04/25/2022, 8:10 PM

## 2022-04-25 NOTE — Procedures (Signed)
Patient Name: Oscar Elliott  MRN: [id number]  Epilepsy Attending: Lora Havens  Referring Physician/Provider: Greta Doom, MD  Duration: 04/25/2022 1437 to 1721  Patient history: 62 year old male who presented with generalized myoclonus. EEG to evaluate for seizure  Level of alertness: lethargic   AEDs during EEG study: GBP, Clonazepam  Technical aspects: This EEG was obtained using a 10 lead EEG system positioned circumferentially without any parasagittal coverage (rapid EEG). Computer selected EEG is reviewed as  well as background features and all clinically significant events.  Description: EEG initially showed continuous generalized 3 to 7 Hz theta-delta slowing. Generalized spikes were also noted, every few seconds. Seizure was noted arising from left/right frontal/temporal/parietal/occipital region. At 1441, rhythmic sharply contoured 6-'7hz'$  theta slowing admixed with 12-'14hz'$  beta activity was noted in right hemisphere which thee evolved to 2-'3hz'$  delta slowing and involved left hemisphere. Video is not available due to rapid eeg but this is concerning for seizure lasting for 50 seconds.  At 1533, EEG showed run of generalized spikes followed by rhythmic myogenic artifact and eventually generalized eeg attenuation. Per staff, patient had generalized tonic clonic seizure, lasting about 45 seconds.   IV ativan was administered at 1541 after which spikes resolved and eeg showed 6-'8hz'$  theta-alpha activity.   Hyperventilation and photic stimulation were not performed.     ABNORMALITY - Seizures, generalized - Spike,generalized - Continuous slow, generalized  IMPRESSION: This limited ceribell EEG initially showed evidence of epileptogenicity with generalized onset and moderate diffuse encephalopathy. One seizure without clinical signs was noted at 1441, arising from right hemisphere, lasting 50 seconds. Second seizure was noted at 1533, was generalized tonic clinic per staff,  appeared generalized in onset, lasting 45 seconds. IV ativan was administered at 1541 after which eeg improved and was suggestive of mild to moderate diffuse encephalopathy.   Dr. Leonel Ramsay was notified.  Safiyyah Vasconez Barbra Sarks

## 2022-04-25 NOTE — Discharge Summary (Signed)
[facility] Discharge Summary   Patient: Oscar Elliott [id number]  PCP: Lauretta Grill, NP  Date of admission: 04/23/2022   Date of discharge:  04/25/2022     Discharge Diagnoses:  Principal Problem:   Altered mental status Active Problems:   Schizophrenia ([place])   Sepsis ([place])   AKI (acute kidney injury) ([place])   Metabolic encephalopathy   Admitted From: Group home Disposition: Transfer to [facility] for continuous EEG monitoring as per neurology for seizures  Recommendations for Outpatient Follow-up:  Follow recommendations after discharge. Follow up LABS/TEST:     Diet recommendation: Cardiac diet  Activity: The patient is advised to gradually reintroduce usual activities, as tolerated  Discharge Condition: stable  Code Status: Full code   History of present illness: As per the H and P dictated on admission  Hospital Course:  Oscar Elliott is a 62 y.o. male with Past medical history of schizophrenia, hypertension, hyperlipidemia, diabetes, and peripheral vascular disease status post left AKA who presents to the ED complaining of tremor.  HPI reviewed from ER physician's chart, patient is unable to provide any details, patient is AAO x 1, due to schizophrenia.  As per ED physician patient was having jerking of arms and right leg, so he was brought in to the ED.  Patient had headache around 10 AM, patient also stated having tremors for a long time to the EMS.  Patient was noticed to have elevated to movements of his face, numbness and right leg, s/p Left AKA     ED Course:  CT head negative, CXR negative WBC count normal, elevated lactic acid and procalcitonin Low blood pressure, most likely sepsis Patient was given IV fluid bolus in the ED, started on empiric antibiotics, blood cultures were sent.   [facility] hospitalist consulted for admission and further management as below.  Assessment and Plan:  # Seizures, patient does not have any prior history of  seizures. Patient presented with involuntary movements and altered mental status.  Patient was found to have sepsis, E. coli bacteremia due to UTI. Patient is AAO x 1 due to schizophrenia, mental status was waxing and waning. Patient was having involuntary movements, EEG was done which showed possible seizures.  Neurology was consulted, patient was loaded with Depakote 20 mg x 1 dose followed 500 mg IV twice daily Patient is postictal, at high risk for respiratory compromise so patient was transferred to stepdown unit for close monitoring.  Neurology recommended to transfer patient to [facility] for continuous EEG monitoring.   # Sepsis, E. coli bacteremia most likely due to UTI  Evaded lactic acid and procalcitonin WBC count within normal range Blood culture growing E. coli, follow sensitivity report Urine culture growing E. coli, sensitive report pending S/p Vanco and Zosyn, de-escalated to ceftriaxone 2 g IV daily S/p NS 125 ml/hr, decraesed to NS 75 ml/hr UA positive,  Repeat blood culture sent today on 2/28     # AKI, baseline creatinine around 1.0 Due to sepsis, prerenal most likely due to dehydration Continue IV fluid as above Monitor renal functions bladder scan 88 PVR  renal sonogram: . Nonobstructing 14 mm left lower pole renal calculus, similar to prior examination. No urinary tract dilation. Normal sonographic parenchymal echogenicity of the kidneys. Incidentally noted thickened appearance of the right adrenal gland, better characterized on prior CT. recommended to follow-up with PCP as an outpatient for right adrenal gland thickening. Avoid nephrotoxic medications, urinary dose medications Cr 3.2--2.8--2.2 gradually improving    # Metabolic acidosis  could be secondary to seizures, patient was transferred to stepdown unit, patient was started on bicarb IV infusion for 10 hours Monitor BMP and further management accordingly.    # Thrombocytopenia most likely due to  sepsis Avoid heparin and Lovenox Patient is on DAPT due to CAD and stents and PVD. Rule out DIC, follow PT PTT and fibrinogen level  Monitor CBC daily     # CAD s/p RCA stent, HTN, HLD, PVD s/p left BKA Hypotensive on admission Continue aspirin and Plavix, hold statin due to elevated CK Held antihypertensive medications due to low blood pressure Will continue to monitor BP and titrate medications accordingly     Anemia secondary to iron deficiency, iron level 10, saturation 7% Folic acid level within normal range Patient will need oral iron supplement after resolution of infection.    # IDDM T2 Started NovoLog sliding scale, monitor CBG Continue diabetic diet     Schizophrenia Patient was on Clozapril 100 in am and 500 qhs and Klonopin 0.5 mg p.o. twice daily Discontinued Abilify and Cymbalta as per psych  Psych consult appreciated.  BPH, continued Flomax   Body mass index is 22.37 kg/m.  Nutrition Interventions:     2/28 patient is postictal, lethargic, unable to tell his name.  At baseline patient is AO x 1 due to severe schizophrenia.  No prior history of seizures.  EEG was positive for seizures, patient was loaded with Depakote 2000 mg followed by 40 mg IV twice daily Neurology recommended continuous EEG monitoring for patient and transferred to [facility].   Consultants: Psychiatry and neurology Procedures: EEG  Discharge Exam: General: At baseline patient is AO x 1, currently patient is postictal and obtunded Cardiovascular: S1 and S2 Present, no Murmur, Respiratory: normal respiratory effort, Bilateral Air entry present and no Crackles, no wheezes Abdomen: Bowel Sound present, Soft and no tenderness, no hernia Extremities: no Pedal edema, no calf tenderness, s/p Left AKA Neurology: Obtunded due to postictal.   affect appropriate.  Filed Weights   04/23/22 1322 04/23/22 2141 04/24/22 0550  Weight: 59 kg 53.7 kg 53.7 kg   Vitals:   04/25/22 1430 04/25/22  1445  BP: 97/64   Pulse: (!) 111   Resp: 20   Temp:  98.9 F (37.2 C)  SpO2: 97%     DISCHARGE MEDICATION: Allergies as of 04/25/2022       Reactions   Penicillins Other (See Comments)   DIZZINESS         Medication List     STOP taking these medications    ARIPiprazole 15 MG tablet Commonly known as: ABILIFY   atorvastatin 80 MG tablet Commonly known as: LIPITOR   DULoxetine 60 MG capsule Commonly known as: CYMBALTA   lisinopril 10 MG tablet Commonly known as: ZESTRIL   metoprolol tartrate 50 MG tablet Commonly known as: LOPRESSOR   nitroGLYCERIN 0.4 MG SL tablet Commonly known as: NITROSTAT       TAKE these medications    acetaminophen 500 MG tablet Commonly known as: TYLENOL Take 500 mg by mouth every 4 (four) hours as needed for moderate pain.   aspirin EC 81 MG tablet Take 81 mg by mouth daily.   cefTRIAXone 2 g in sodium chloride 0.9 % 100 mL Inject 2 g into the vein daily. Start taking on: April 26, 2022   clonazePAM 0.5 MG tablet Commonly known as: KLONOPIN Take 1 tablet (0.5 mg total) by mouth at bedtime.   clopidogrel 75 MG tablet Commonly  known as: PLAVIX Take 75 mg by mouth daily.   cloZAPine 100 MG tablet Commonly known as: CLOZARIL 100 mg in AM and 500 mg at night What changed:  how much to take how to take this when to take this additional instructions   cyanocobalamin 1000 MCG tablet Take 1 tablet (1,000 mcg total) by mouth daily. Start taking on: April 26, 2022   diclofenac Sodium 1 % Gel Commonly known as: VOLTAREN Apply 2 g topically 4 (four) times daily as needed (pain).   docusate sodium 100 MG capsule Commonly known as: COLACE Take 100 mg by mouth 2 (two) times daily.   EasyMax Test test strip Generic drug: glucose blood 1 each by Other route as needed.   gabapentin 100 MG capsule Commonly known as: NEURONTIN Take 100 mg by mouth 2 (two) times daily.   insulin aspart 100 UNIT/ML  injection Commonly known as: novoLOG Inject 2 Units into the skin 3 (three) times daily before meals.   insulin detemir 100 UNIT/ML injection Commonly known as: LEVEMIR Inject 0.3 mLs (30 Units total) into the skin daily. What changed:  how much to take additional instructions   ketoconazole 2 % shampoo Commonly known as: NIZORAL Apply 1 application topically 2 (two) times a week.   latanoprost 0.005 % ophthalmic solution Commonly known as: XALATAN Place 1 drop into both eyes at bedtime.   Linzess 145 MCG Caps capsule Generic drug: linaclotide TAKE 1 CAPSULE BY MOUTH ONCE DAILY BEFORE BREAKFAST.   omeprazole 40 MG capsule Commonly known as: PRILOSEC Take 40 mg by mouth 2 (two) times daily.   polyethylene glycol 17 g packet Commonly known as: MIRALAX / GLYCOLAX Take 17 g by mouth daily as needed for moderate constipation.   sodium chloride 0.9 % SOLN 50 mL with thiamine 100 MG/ML SOLN 500 mg Inject 500 mg into the vein daily.   tamsulosin 0.4 MG Caps capsule Commonly known as: FLOMAX Take 0.4 mg by mouth daily.   valproate 500 mg in dextrose 5 % 50 mL Inject 500 mg into the vein every 12 (twelve) hours.   Vitamin D 50 MCG (2000 UT) Caps Take 2,000 Units by mouth once a week.       Allergies  Allergen Reactions   Penicillins Other (See Comments)    DIZZINESS    Discharge Instructions     Diet - low sodium heart healthy   Complete by: As directed    Discharge instructions   Complete by: As directed    Patient should follow with PCP, psychiatrist and neurology after discharge.   Increase activity slowly   Complete by: As directed        The results of significant diagnostics from this hospitalization (including imaging, microbiology, ancillary and laboratory) are listed below for reference.    Significant Diagnostic Studies: DG Chest Port 1 View  Result Date: 04/25/2022 CLINICAL DATA:  Shortness of breath. EXAM: PORTABLE CHEST 1 VIEW COMPARISON:   Chest x-ray dated April 23, 2022. FINDINGS: The heart size and mediastinal contours are within normal limits. Normal pulmonary vascularity. Streaky linear opacities at both lung bases consistent with atelectasis. No focal consolidation, pleural effusion, or pneumothorax. No acute osseous abnormality. IMPRESSION: 1. Bibasilar atelectasis. Electronically Signed   By: Titus Dubin M.D.   On: 04/25/2022 12:13   EEG adult  Result Date: 04/24/2022 Greta Doom, MD     04/25/2022  1:49 PM History: 62 yo M with Sedation: None Technique: This EEG was acquired with electrodes  placed according to the International 10-20 electrode system (including Fp1, Fp2, F3, F4, C3, C4, P3, P4, O1, O2, T3, T4, T5, T6, A1, A2, Fz, Cz, Pz). The following electrodes were missing or displaced: none. Background: The background consists of intermixed alpha and beta activities. There is a well defined posterior dominant rhythm of  8 Hz that attenuates with eye opening. Sleep is not recorded. There are very frequent generalized spike and wave discharges which are sometimes associated with myoclonus.  At times these occur in isolation, but at other times they do recur with a frequency of 4 to 5 Hz, but always lasting less than a second total.  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 Roland Rack, MD [facility] [phone] If 7pm- 7am, please page neurology on call as listed in [place].   US RENAL  Result Date: 04/23/2022 CLINICAL DATA:  Acute kidney injury EXAM: RENAL / URINARY TRACT ULTRASOUND COMPLETE COMPARISON:  Ultrasound dated 05/14/2017, CT abdomen and pelvis dated 02/23/2014 FINDINGS: Right Kidney: Length = 10.9 cm AP renal pelvis diameter = <10 mm Normal parenchymal  echogenicity with preserved corticomedullary differentiation. No urinary tract dilation or shadowing calculi. The ureter is not seen. Left Kidney: Length = 11.5 cm AP renal pelvis diameter = <10 mm Normal parenchymal echogenicity with preserved corticomedullary differentiation. No urinary tract dilation. Shadowing lower pole stone measures 14 mm, similar to prior examination. The ureter is not seen. Bladder: Appears normal for degree of bladder distention. Other: Incidentally noted thickened appearance of the right adrenal gland, better characterized on prior CT. IMPRESSION: 1. Nonobstructing 14 mm left lower pole renal calculus, similar to prior examination. 2. No urinary tract dilation. Normal sonographic parenchymal echogenicity of the kidneys. 3. Incidentally noted thickened appearance of the right adrenal gland, better characterized on prior CT. Electronically Signed   By: Darrin Nipper M.D.   On: 04/23/2022 17:13   CT Head Wo Contrast  Result Date: 04/23/2022 CLINICAL DATA:  Mental status change, unknown cause. Headaches and tremor beginning today. Jerking of the arms and legs EXAM: CT HEAD WITHOUT CONTRAST TECHNIQUE: Contiguous axial images were obtained from the base of the skull through the vertex without intravenous contrast. RADIATION DOSE REDUCTION: This exam was performed according to the departmental dose-optimization program which includes automated exposure control, adjustment of the mA and/or kV according to patient size and/or use of iterative reconstruction technique. COMPARISON:  05/30/2016 FINDINGS: Brain: Generalized volume loss without lobar predominance. No focal abnormality seen affecting the brainstem or cerebellum. No evidence of large vessel stroke or advanced small-vessel disease. No cortical or large vessel territory abnormality. No mass, hemorrhage, hydrocephalus or extra-axial collection. Vascular: No abnormal vascular finding. Skull: Chronic plagiocephaly.  No acute finding.  Sinuses/Orbits: Clear/normal Other: None IMPRESSION: No acute CT finding. Generalized volume loss without lobar predominance. No evidence of large vessel stroke or advanced small-vessel disease. Electronically Signed   By: Nelson Chimes M.D.   On: 04/23/2022 15:39   DG Chest 2 View  Result Date: 04/23/2022 CLINICAL DATA:  Trauma EXAM: CHEST - 2 VIEW COMPARISON:  CXR 02/27/20 FINDINGS: No pleural effusion. No pneumothorax. Unchanged and normal cardiac and mediastinal contours. No focal airspace opacity. Visualized upper abdomen is notable for colonic gaseous distention. No radiographically apparent displaced rib fractures. Vertebral body heights are maintained. IMPRESSION: 1. No active cardiopulmonary disease. 2. Colonic gaseous distention. Electronically  Signed   By: Marin Roberts M.D.   On: 04/23/2022 14:45    Microbiology: Recent Results (from the past 240 hour(s))  Urine Culture (for pregnant, neutropenic or urologic patients or patients with an indwelling urinary catheter)     Status: Abnormal (Preliminary result)   Collection Time: 04/23/22  3:53 PM   Specimen: Urine, Clean Catch  Result Value Ref Range Status   Specimen Description   Final    URINE, CLEAN CATCH Performed at [facility], [address], [place], [place] [address]    Special Requests   Final    NONE Performed at [facility], [address], [place], [place] [address]    Culture (A)  Final    >=100,000 COLONIES/mL ESCHERICHIA COLI SUSCEPTIBILITIES TO FOLLOW Performed at [place] [address] [place], [place] [address]    Report Status PENDING  Incomplete  Culture, blood (routine x 2)     Status: Abnormal (Preliminary result)   Collection Time: 04/23/22  3:54 PM   Specimen: BLOOD  Result Value Ref Range Status   Specimen Description   Final    BLOOD BLOOD RIGHT ARM Performed at [facility], [address], [place], [place] [address]    Special Requests   Final     BOTTLES DRAWN AEROBIC AND ANAEROBIC Blood Culture adequate volume Performed at [facility], [address], [place], [place] [address]    Culture  Setup Time   Final    GRAM NEGATIVE RODS IN BOTH AEROBIC AND ANAEROBIC BOTTLES CRITICAL RESULT CALLED TO, READ BACK BY AND VERIFIED WITHPamelia Hoit [facility] [id number] 04/24/22 HNM Performed at [facility], [address] [place], [place] [address]    Culture (A)  Final    ESCHERICHIA COLI SUSCEPTIBILITIES TO FOLLOW Performed at [facility], [place] [address] [place], [place] [address]    Report Status PENDING  Incomplete  Culture, blood (routine x 2)     Status: Abnormal (Preliminary result)   Collection Time: 04/23/22  3:54 PM   Specimen: BLOOD  Result Value Ref Range Status   Specimen Description   Final    BLOOD BLOOD LEFT ARM Performed at [facility], [address], [place], [place] [address]    Special Requests   Final    BOTTLES DRAWN AEROBIC AND ANAEROBIC Blood Culture adequate volume Performed at [facility], [address] [place], [place] [address]    Culture  Setup Time   Final    GRAM NEGATIVE RODS IN BOTH AEROBIC AND ANAEROBIC BOTTLES CRITICAL VALUE NOTED.  VALUE IS CONSISTENT WITH PREVIOUSLY REPORTED AND CALLED VALUE. Performed at [facility], [address] [place], [place] [address]    Culture ESCHERICHIA COLI (A)  Final   Report Status PENDING  Incomplete  Blood Culture ID Panel (Reflexed)     Status: Abnormal   Collection Time: 04/23/22  3:54 PM  Result Value Ref Range Status   Enterococcus faecalis NOT DETECTED NOT DETECTED Final   Enterococcus Faecium NOT DETECTED NOT DETECTED Final   Listeria monocytogenes NOT DETECTED NOT DETECTED Final   Staphylococcus species NOT DETECTED NOT DETECTED Final   Staphylococcus aureus (BCID) NOT DETECTED NOT DETECTED Final   Staphylococcus epidermidis NOT DETECTED NOT DETECTED Final   Staphylococcus lugdunensis NOT  DETECTED NOT DETECTED Final   Streptococcus species NOT DETECTED NOT DETECTED Final   Streptococcus agalactiae NOT DETECTED NOT DETECTED Final   Streptococcus pneumoniae NOT DETECTED  NOT DETECTED Final   Streptococcus pyogenes NOT DETECTED NOT DETECTED Final   A.calcoaceticus-baumannii NOT DETECTED NOT DETECTED Final   Bacteroides fragilis NOT DETECTED NOT DETECTED Final   Enterobacterales DETECTED (A) NOT DETECTED Corrected    Comment: Enterobacterales represent a large order of gram negative bacteria, not a single organism. CRITICAL RESULT CALLED TO, READ BACK BY AND VERIFIED WITH: Pamelia Hoit PHARMD [id number] 04/24/22 HNM CORRECTED ON 02/27 AT [id number]: PREVIOUSLY REPORTED AS DETECTED Enterobacterales represent a large order of gram negative bacteria, not a single organism. CRITICAL RESULT CALLED TO, READ BACK BY AND VERIFIED WITH: MORGAN GOBBLE PHARMD 04/24/22 HNM    Enterobacter cloacae complex NOT DETECTED NOT DETECTED Final   Escherichia coli DETECTED (A) NOT DETECTED Corrected    Comment: CRITICAL RESULT CALLED TO, READ BACK BY AND VERIFIED WITH: MORGAN GOBBLE PHARMD [id number] 04/24/22 HNM CORRECTED ON 02/27 AT [id number]: PREVIOUSLY REPORTED AS DETECTED CRITICAL RESULT CALLED TO, READ BACK BY AND VERIFIED WITH: MORGAN GOBBLE PHARMD 04/24/22 HNM    Klebsiella aerogenes NOT DETECTED NOT DETECTED Final   Klebsiella oxytoca NOT DETECTED NOT DETECTED Final   Klebsiella pneumoniae NOT DETECTED NOT DETECTED Final   Proteus species NOT DETECTED NOT DETECTED Final   Salmonella species NOT DETECTED NOT DETECTED Final   Serratia marcescens NOT DETECTED NOT DETECTED Final   Haemophilus influenzae NOT DETECTED NOT DETECTED Final   Neisseria meningitidis NOT DETECTED NOT DETECTED Final   Pseudomonas aeruginosa NOT DETECTED NOT DETECTED Final   Stenotrophomonas maltophilia NOT DETECTED NOT DETECTED Final   Candida albicans NOT DETECTED NOT DETECTED Final   Candida auris NOT DETECTED NOT DETECTED Final   Candida  glabrata NOT DETECTED NOT DETECTED Final   Candida krusei NOT DETECTED NOT DETECTED Final   Candida parapsilosis NOT DETECTED NOT DETECTED Final   Candida tropicalis NOT DETECTED NOT DETECTED Final   Cryptococcus neoformans/gattii NOT DETECTED NOT DETECTED Final   CTX-M ESBL NOT DETECTED NOT DETECTED Final   Carbapenem resistance IMP NOT DETECTED NOT DETECTED Final   Carbapenem resistance KPC NOT DETECTED NOT DETECTED Final   Carbapenem resistance NDM NOT DETECTED NOT DETECTED Final   Carbapenem resist OXA 48 LIKE NOT DETECTED NOT DETECTED Final   Carbapenem resistance VIM NOT DETECTED NOT DETECTED Final    Comment: Performed at [facility], [address] [place], [place] [address]     Labs: CBC: Recent Labs  Lab 04/23/22 1327 04/24/22 0326 04/25/22 0618  WBC 8.4 7.0 7.1  NEUTROABS 7.1  --   --   HGB 10.5* 9.0* 9.2*  HCT 31.0* 26.3* 27.4*  MCV 86.6 84.8 86.4  PLT 89* 58* 73*   Basic Metabolic Panel: Recent Labs  Lab 04/23/22 1327 04/24/22 0326 04/25/22 0618  NA 137 139 149*  K 3.5 3.7 3.1*  CL 102 109 119*  CO2 23 21* 20*  GLUCOSE 193* 173* 104*  BUN 56* 51* 44*  CREATININE 3.20* 2.83* 2.21*  CALCIUM 8.3* 7.6* 7.8*  MG 2.1 2.0 2.5*  PHOS  --  3.8 2.6   Liver Function Tests: Recent Labs  Lab 04/23/22 1327  AST 45*  ALT 30  ALKPHOS 87  BILITOT 0.8  PROT 6.5  ALBUMIN 3.0*   No results for input(s): "LIPASE", "AMYLASE" in the last 168 hours. No results for input(s): "AMMONIA" in the last 168 hours. Cardiac Enzymes: Recent Labs  Lab 04/23/22 1327 04/24/22 0326  CKTOTAL 491* 323   BNP (last 3 results) No results for input(s): "BNP" in the last  8760 hours. CBG: Recent Labs  Lab 04/24/22 2133 04/25/22 0737 04/25/22 1141 04/25/22 1358 04/25/22 1428  GLUCAP 110* 89 125* 116* 126*    Time spent: 35 minutes  Signed:  Val Riles  [facility] 04/25/2022 2:58 PM

## 2022-04-25 NOTE — Progress Notes (Signed)
MD called due to pt having active jerky movements, order received to give ativan, pt turned to left side and suctioned to keep airway clear, in no respiratory distress, vs wnl as charted.

## 2022-04-25 NOTE — Progress Notes (Cosign Needed Addendum)
LTM EEG hooked up and running -Very , very difficult set-up. No initial skin breakdown - push button tested - [facility] monitoring.

## 2022-04-25 NOTE — Progress Notes (Signed)
eLink Physician-Brief Progress Note Patient Name: Oscar Elliott DOB: January 28, 1961 MRN: [id number]   Date of Service  04/25/2022  HPI/Events of Note  Patient with agitated delirium.  eICU Interventions  Zyprexa 5 mg IM x 1, bilateral wrist restraints also ordered for patient safety.        Frederik Pear 04/25/2022, 9:46 PM

## 2022-04-25 NOTE — Consult Note (Signed)
Neurology Consultation Reason for Consult: Altered Mental Status Referring Physician: Mortimer Fries, K  CC: Altered Mental Status  History is obtained from:chart review  HPI: Oscar Elliott is a 62 y.o. male with a history of schizophrenia, hyperlipidemia, diabetes who presented with twitching yesterday in the setting of renal failure.  He has a history of tremor, but was having abnormal jerking on 2/26 and therefore was brought into the emergency department.  An EEG was performed yesterday, and when the read was finalized today, it revealed evidence of generalized epilepsy.  This would support the myoclonus since admission is being epileptic in nature as opposed to myoclonus secondary to toxic metabolic encephalopathy as was previously presumed.  Neurology was therefore consulted.  He had a similar twitching 15 years ago that subsequently went away after a few days and he was not told to call. He has never had a seizure. He denies staring spells. Trazodone was added last week, but this is the only medication change  On a day to day basis he can carry on a conversation, but is not oriented. He is extremely paranoid and for this reason he lives in a group home.  He has an amputation, but is able to walk with a walker and a prosthesis.   Past Medical History:  Diagnosis Date   CAD (coronary artery disease)    a. 10/2013 Lateral STEMI/PCI: LM nl, LAD min irregs, D1 occluded sub branch, LCX min irregs mid, OM large/patent, RCA 153m(2.75x28 Promus DES).   Cholelithiasis    Essential hypertension    History of renal failure    a. 06/2007: in setting of rhabdo post L AKA.   Hyperlipidemia    MI (myocardial infarction) ([facility]    Nephrolithiasis    Peripheral vascular disease ([facility]    a. 06/2007: s/p L AKA @ [facility]   Schizophrenia ([facility]    Type 2 diabetes mellitus (HCC)      Family History  Problem Relation Age of Onset   Other Other        no premature CAD.     Social History:  reports that he has  been smoking cigars and cigarettes. He has a 5.00 pack-year smoking history. He has never used smokeless tobacco. He reports that he does not drink alcohol and does not use drugs.   Exam: Current vital signs: BP 97/64   Pulse (!) 111   Temp 99 F (37.2 C) (Axillary)   Resp 20   Ht '5\' 1"'$  (1.549 m)   Wt 53.7 kg   SpO2 97%   BMI 22.37 kg/m  Vital signs in last 24 hours: Temp:  [98 F (36.7 C)-99.3 F (37.4 C)] 99 F (37.2 C) (02/28 1323) Pulse Rate:  [98-118] 111 (02/28 1430) Resp:  [14-20] 20 (02/28 1430) BP: (97-131)/(60-84) 97/64 (02/28 1430) SpO2:  [89 %-100 %] 97 % (02/28 1430)   Physical Exam  Appears well-developed and well-nourished. He has a left BKA  Neuro: Mental Status: Patient is obtunded, he does not follow commands or open eyes.  He does grimace minimally to intranasal Q-tip stimulation, but not to sternal rub or nailbed pressure. Cranial Nerves: II: He does not blink to threat. Pupils are equal, round, and reactive to light.   III,IV, VI: Eyes are disconjugate, but both move with doll's maneuver V: VII: Corneals are intact Motor: Minimal flexion to nailbed pressure in all four extremities Sensory: Does not perform  Cerebellar: Does not perform  Subsequently after transfer to ICU, he began localizing  the pain, fixating and tracking and making nonpurposeful verbalizations.  Given the significant improvement I strongly suspect an unwitnessed seizure with me finding him in a postictal state as an etiology for his GCS of five on my initial assessment.   I have reviewed labs in epic and the results pertinent to this consultation are: ABG 7.29/29/98 B12 308 Creatinine 2.2 down from 3.2 on admission, BUN 44 Sodium 149 Calcium 7.8 with an albumin of three(corrects to 8.6) Phos 3.8 Magnesium 2.1  I have reviewed the images obtained: CT head-no acute findings  Impression: 62 year old male who presented with generalized myoclonus which appears to be  epileptic in nature.  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  It is possible that he had a similar expression of an underlying seizure disorder 15 years ago, but this is not clear.  Trazodone can lower seizure threshold in those predisposed to it, and my suspicion is that he has lowered his seizure threshold to a point where an underlying predisposition has now become apparent.  Following transfer to the intensive care unit, he subsequently had an additional convulsive seizure which was captured on cerebell.  His mental status declined further, and after discussion with the ICU team he is going to be intubated and I will give him Ativan as well.  Recommendations: 1) Ativan 2 mg x 1 2) Depacon 2 g x 1 followed by 500 mg 3 times daily 3) VPA level in the morning 4) he will need to be transferred to [facility] for continuous EEG monitoring. 5) will leave connected to cerebell until transferred.  This patient is critically ill and at significant risk of neurological worsening, death and care requires constant monitoring of vital signs, hemodynamics,respiratory and cardiac monitoring, neurological assessment, discussion with family, other specialists and medical decision making of high complexity. I spent 50 minutes of neurocritical care time  in the care of  this patient. This was time spent independent of any time provided by nurse practitioner or PA.  Roland Rack, MD [facility] [phone]  If 7pm- 7am, please page neurology on call as listed in [place]. 04/25/2022  4:02 PM

## 2022-04-25 NOTE — Procedures (Signed)
History: 62 yo M with   Sedation: None  Technique: This EEG was acquired with electrodes placed according to the International 10-20 electrode system (including Fp1, Fp2, F3, F4, C3, C4, P3, P4, O1, O2, T3, T4, T5, T6, A1, A2, Fz, Cz, Pz). The following electrodes were missing or displaced: none.   Background: The background consists of intermixed alpha and beta activities. There is a well defined posterior dominant rhythm of  8 Hz that attenuates with eye opening. Sleep is not recorded.  There are very frequent generalized spike and wave discharges which are sometimes associated with myoclonus.  At times these occur in isolation, but at other times they do recur with a frequency of 4 to 5 Hz, but always lasting less than a second total.    Photic stimulation: Physiologic driving is now performed  EEG Abnormalities: 1) frequent generalized epileptiform discharges sometimes associated with myoclonus  Clinical Interpretation: This EEG is consistent with an idiopathic generalized epilepsy with associated myoclonus.   Roland Rack, MD [facility] [phone]  If 7pm- 7am, please page neurology on call as listed in [place].

## 2022-04-25 NOTE — Significant Event (Signed)
Rapid Response Event Note   Reason for Call :   Altered mental status Initial Focused Assessment:   Per bedside RN- patient had taken his medications this am- but over the shift his mental status was declining.  Dr. Leonel Ramsay at bedside stating patient unresponsive to aggressive stimuli.  Vitals stable.    Interventions:  Dr. Leonel Ramsay stated that he believes patient has had a seizure.  He ordered for patient to receive depakote and transfer to the ICU for closer monitoring and to be placed on the ceribell.    Plan of Care:  Patient transferred to ICU 13.   Event Summary:   MD Notified: Dr. Leonel Ramsay Call Time: Arrival Time: In room when I arrived End Time:14:10  Reggie Pile, RN

## 2022-04-25 NOTE — Progress Notes (Signed)
Patient found to be lethargic rapid response initiated.DR. Leonel Ramsay at bedside, rapid response team in place. Determined patient has to be transferred to ICU for closer observation and administration of Depakote.

## 2022-04-25 NOTE — Progress Notes (Signed)
eLink Physician-Brief Progress Note Patient Name: Oscar Elliott DOB: June 05, 1960 MRN: [id number]   Date of Service  04/25/2022  HPI/Events of Note  Bedside RN called to say patient has bilateral 4 mm pupils that are not reactive to light, patient otherwise non-focal on bedside neuro exam per RN, he received Abilify earlier in the evening, he is too confused to reliably ascertain visual acuity.  eICU Interventions  Neuro-Hospitalist coming to evaluate patient at bedside.        Frederik Pear 04/25/2022, 10:24 PM

## 2022-04-25 NOTE — H&P (Signed)
NAME:  Oscar Elliott, MRN:  [id number], DOB:  30-Mar-1960, LOS: 0 ADMISSION DATE:  (Not on file) CONSULTATION DATE:  04/25/2022 REFERRING MD:  [place]) CHIEF COMPLAINT:  Seizures   History of Present Illness:  62 year old man who presented to [facility] 2/26 for tremor, HA. Presented with jerking tremor of R arm, R leg, HA. PMHx significant for HTN, HLD, CAD c/b STEMI (s/p DES to RCA 10/2013), PVD (s/p L AKA), T2DM, CKD, schizophrenia (baseline intermittent disorientation).  Patient presented to [facility] ED 2/26 with jerking of his R arm and R leg. On arrival, noted to be afebrile, tachycardic to 117, BP soft 95/69, SpO2 99%. Labs were notable for WBC 8.4, Hgb 10.5, Plt 89. Electrolytes WNL. BUN 56 (18), Cr 3.2 (1.13). AST 45 (27), ALT 30 (32), Tbili WNL. CK 491, LA 2.9, PCT 20.51. UA with mod Hgb, large leuks, mild protein. Blood and urine Cx positive for E. Coli. Broad spectrum antibiotics initiated (cefepime/vanc/Flagyl), narrowed to ceftriaxone 2/27. CXR was unremarkable. US Renal was obtained showing nonobstructing 59m L renal calculus, unchanged from prior. CT Head NAICA with generalized volume loss, no LVO/advanced small vessel disease. Patient was subsequently admitted to the hospitalist service.  Patient was noted to have E. coli bacteremia and E. coli UTI, initially was on vancomycin and Zosyn, then antibiotics were de-escalated to ceftriaxone, sensitivities are pending, patient remained altered despite appropriate treatment of sepsis, he was having jerking movements, neurology was consulted, patient was loaded with valproic acid, then was started on 500 mg twice daily, [facility]   Pertinent Medical History:   Past Medical History:  Diagnosis Date   CAD (coronary artery disease)    a. 10/2013 Lateral STEMI/PCI: LM nl, LAD min irregs, D1 occluded sub branch, LCX min irregs mid, OM large/patent, RCA 1077m(2.75x28 Promus DES).   Cholelithiasis    Essential hypertension    History of renal failure    a. 06/2007: in setting of rhabdo post L AKA.   Hyperlipidemia    MI (myocardial infarction) ([facility]   Nephrolithiasis    Peripheral vascular disease ([facility]   a. 06/2007: s/p L AKA @ [facility]   Schizophrenia ([facility]   Type 2 diabetes mellitus ([facility]   Significant Hospital Events: Including procedures, antibiotic start and stop dates in addition to other pertinent events     Interim History / Subjective:  [facility]  Objective:  There were no vitals taken for this visit.       No intake or output data in the 24 hours ending 04/25/22 1740  There were no vitals filed for this visit.  Physical exam: General: Chronically ill-appearing male, lying on the bed HEENT: [place]/AT, eyes anicteric.  moist mucus membranes Neuro: Eyes closed, opens with painful stimuli, not following commands, moving all extremity though he has left AKA.  Pupils reactive to light Chest: Coarse breath sounds, no wheezes or rhonchi Heart: Regular rate and rhythm, no murmurs or gallops Abdomen: Soft, nontender, nondistended, bowel sounds present Skin: No rash  Labs and images were reviewed  Resolved Hospital Problem List:    Assessment & Plan:  Seizures Acute encephalopathy, multifactorial secondary to postictal state versus due to sepsis or metabolic in the setting of AKI - Neuro following, appreciate recommendations - Ceribell EEG with +generalized epilepsy and myoclonus - Patient will be started on continuous EEG -Continue valproic  acid 500 mg twice daily -Follow-up ammonia level - Frequent neuro checks - Seizure precautions  Sepsis secondary to E. Coli infection in the setting of +UCx/Bcx Lactic acidosis, resolved - Goal MAP > 65 - Continue IV fluid with Ringer lactate - Not requiring vasopressors at present - Trend WBC, fever curve; LA normalized - F/u  finalized Cx data - Continue IV ceftriaxone  CAD with STEMI s/p DES 2015 PVD s/p L AKA HTN HLD Last Echo 05/2016 with EF 50%, G1DD. - Hold home antihypertensives for now - Cardiac monitoring - Continue DAPT (ASA/Plavix), statin  AKI -Serum creatinine continue to improve Continue IV fluid Avoid nephrotoxic agents trend BMP - Monitor I&Os  T2DM -Last hemoglobin A1c 6.7 -Continue SSI - CBGs Q4H - Goal CBG 140-180  Schizophrenia, less likely serotonin syndrome - Psychiatry consulted, appreciate recommendations - Continue increased-dose clonazepam, clozapine - Holding duloxetine/Abilify, given potential of odd manifestation of NMS  Best Practice: (right click and "Reselect all SmartList Selections" daily)   Diet/type: NPO DVT prophylaxis: SCD, SQH GI prophylaxis: PPI Lines: N/A Foley:  N/A Code Status:  full code Last date of multidisciplinary goals of care discussion [Pending]  Labs:  CBC: Recent Labs  Lab 04/23/22 1327 04/24/22 0326 04/25/22 0618  WBC 8.4 7.0 7.1  NEUTROABS 7.1  --   --   HGB 10.5* 9.0* 9.2*  HCT 31.0* 26.3* 27.4*  MCV 86.6 84.8 86.4  PLT 89* 58* 73*    Basic Metabolic Panel: Recent Labs  Lab 04/23/22 1327 04/24/22 0326 04/25/22 0618  NA 137 139 149*  K 3.5 3.7 3.1*  CL 102 109 119*  CO2 23 21* 20*  GLUCOSE 193* 173* 104*  BUN 56* 51* 44*  CREATININE 3.20* 2.83* 2.21*  CALCIUM 8.3* 7.6* 7.8*  MG 2.1 2.0 2.5*  PHOS  --  3.8 2.6    GFR: Estimated Creatinine Clearance: 25.6 mL/min (A) (by C-G formula based on SCr of 2.21 mg/dL (H)). Recent Labs  Lab 04/23/22 1327 04/23/22 1553 04/24/22 0326 04/25/22 0618  PROCALCITON 20.51  --   --   --   WBC 8.4  --  7.0 7.1  LATICACIDVEN 2.9* 1.7 1.3  --     Liver Function Tests: Recent Labs  Lab 04/23/22 1327  AST 45*  ALT 30  ALKPHOS 87  BILITOT 0.8  PROT 6.5  ALBUMIN 3.0*    No results for input(s): "LIPASE", "AMYLASE" in the last 168 hours. No results for input(s):  "AMMONIA" in the last 168 hours.  ABG:    Component Value Date/Time   PHART 7.29 (L) 04/25/2022 1410   PCO2ART 29 (L) 04/25/2022 1410   PO2ART 98 04/25/2022 1410   HCO3 13.9 (L) 04/25/2022 1410   TCO2 22 11/20/2013 1632   ACIDBASEDEF 11.2 (H) 04/25/2022 1410   O2SAT 97.8 04/25/2022 1410   Coagulation Profile: Recent Labs  Lab 04/25/22 1620  INR 1.3*    Cardiac Enzymes: Recent Labs  Lab 04/23/22 1327 04/24/22 0326 04/25/22 1538  CKTOTAL 491* 323 233    HbA1C: Hgb A1c MFr Bld  Date/Time Value Ref Range Status  04/23/2022 01:27 PM 6.7 (H) 4.8 - 5.6 % Final    Comment:    (NOTE)         Prediabetes: 5.7 - 6.4         Diabetes: >6.4         Glycemic control for adults with diabetes: <7.0   02/23/2014 12:54 AM 5.8 (H) <5.7 % Final  Comment:    (NOTE)                                                                       According to the ADA Clinical Practice Recommendations for 2011, when HbA1c is used as a screening test:  >=6.5%   Diagnostic of Diabetes Mellitus           (if abnormal result is confirmed) 5.7-6.4%   Increased risk of developing Diabetes Mellitus References:Diagnosis and Classification of Diabetes Mellitus,Diabetes [id number] 1):S62-S69 and Standards of Medical Care in         Diabetes - 2011,Diabetes [id number] (Suppl 1):S11-S61.    CBG: Recent Labs  Lab 04/24/22 2133 04/25/22 0737 04/25/22 1141 04/25/22 1358 04/25/22 1428  GLUCAP 110* 89 125* 116* 126*    Review of Systems:   Patient is encephalopathic and/or intubated. Therefore history has been obtained from chart review.   Past Medical History:  He,  has a past medical history of CAD (coronary artery disease), Cholelithiasis, Essential hypertension, History of renal failure, Hyperlipidemia, MI (myocardial infarction) ([place]), Nephrolithiasis, Peripheral vascular disease ([place]), Schizophrenia ([place]), and Type 2 diabetes mellitus ([place]).   Surgical History:   Past Surgical  History:  Procedure Laterality Date   Above-the-knee amputation Left    COLONOSCOPY WITH PROPOFOL N/A 12/09/2018   fields: nonthrombosed hemorrhoids, decreased anal sphincter tone. poor prep, colonoscopy aborted   FLEXIBLE SIGMOIDOSCOPY  11/17/2019   Procedure: FLEXIBLE SIGMOIDOSCOPY;  Surgeon: Eloise Harman, DO;  [facility];  Service: Endoscopy;;   LEFT HEART CATHETERIZATION WITH CORONARY ANGIOGRAM N/A 11/20/2013   Procedure: LEFT HEART CATHETERIZATION WITH CORONARY ANGIOGRAM;  Surgeon: Jettie Booze, MD;  Location: [facility] CATH LAB;  Service: Cardiovascular;  Laterality: N/A;   Social History:   reports that he has been smoking cigars and cigarettes. He has a 5.00 pack-year smoking history. He has never used smokeless tobacco. He reports that he does not drink alcohol and does not use drugs.   Family History:  His family history includes Other in an other family member.   Allergies: Allergies  Allergen Reactions   Penicillins Other (See Comments)    DIZZINESS    Home Medications: Prior to Admission medications   Medication Sig Start Date End Date Taking? Authorizing Provider  acetaminophen (TYLENOL) 500 MG tablet Take 500 mg by mouth every 4 (four) hours as needed for moderate pain.    [provider]  ARIPiprazole (ABILIFY) 15 MG tablet Take 1 tablet (15 mg total) by mouth daily. 05/04/20   Cloria Spring, MD  aspirin EC 81 MG tablet Take 81 mg by mouth daily.    [provider]  atorvastatin (LIPITOR) 80 MG tablet Take 1 tablet (80 mg total) by mouth daily at 6 PM. 05/31/16   Kathie Dike, MD  cefTRIAXone 2 g in sodium chloride 0.9 % 100 mL Inject 2 g into the vein daily. 04/26/22   Val Riles, MD  Cholecalciferol (VITAMIN D) 50 MCG (2000 UT) CAPS Take 2,000 Units by mouth once a week.     [provider]  clonazePAM (KLONOPIN) 0.5 MG tablet Take 1 tablet (0.5 mg total) by mouth at bedtime. 05/04/20   Cloria Spring, MD  clopidogrel  (PLAVIX)  75 MG tablet Take 75 mg by mouth daily.    [provider]  cloZAPine (CLOZARIL) 100 MG tablet 100 mg in AM and 500 mg at night Patient taking differently: Take 100-500 mg by mouth See admin instructions. Take 100 mg by mouth in morning and 500 mg at night 03/26/19   [place] Crane, MD  cyanocobalamin 1000 MCG tablet Take 1 tablet (1,000 mcg total) by mouth daily. 04/26/22   Val Riles, MD  diclofenac Sodium (VOLTAREN) 1 % GEL Apply 2 g topically 4 (four) times daily as needed (pain).  Patient not taking: Reported on 04/23/2022    [provider]  docusate sodium (COLACE) 100 MG capsule Take 100 mg by mouth 2 (two) times daily.    [provider]  DULoxetine (CYMBALTA) 60 MG capsule Take 2 capsules (120 mg total) by mouth daily. 08/20/19   Norman Clay, MD  [facility] TEST test strip 1 each by Other route as needed. 07/06/20   [provider]  gabapentin (NEURONTIN) 100 MG capsule Take 100 mg by mouth 2 (two) times daily.    [provider]  insulin aspart (NOVOLOG) 100 UNIT/ML injection Inject 2 Units into the skin 3 (three) times daily before meals. Patient not taking: Reported on 04/23/2022 06/01/16   Kathie Dike, MD  insulin detemir (LEVEMIR) 100 UNIT/ML injection Inject 0.3 mLs (30 Units total) into the skin daily. Patient taking differently: Inject 33 Units into the skin daily. Hold if pt does not eat . 05/31/16   Kathie Dike, MD  ketoconazole (NIZORAL) 2 % shampoo Apply 1 application topically 2 (two) times a week.    [provider]  latanoprost (XALATAN) 0.005 % ophthalmic solution Place 1 drop into both eyes at bedtime.    [provider]  linaclotide (LINZESS) 145 MCG CAPS capsule TAKE 1 CAPSULE BY MOUTH ONCE DAILY BEFORE BREAKFAST. 07/10/21   Mahala Menghini, PA-C  lisinopril (ZESTRIL) 10 MG tablet Take 10 mg by mouth daily. Hold if systolic is less than [id number]    [provider]  metoprolol (LOPRESSOR) 50 MG  tablet Take 1 tablet (50 mg total) by mouth 2 (two) times daily. 12/02/13   Imogene Burn, PA-C  nitroGLYCERIN (NITROSTAT) 0.4 MG SL tablet Place 1 tablet (0.4 mg total) under the tongue every 5 (five) minutes x 3 doses as needed for chest pain. 11/23/13   Barrett, Evelene Croon, PA-C  omeprazole (PRILOSEC) 40 MG capsule Take 40 mg by mouth 2 (two) times daily.     [provider]  polyethylene glycol (MIRALAX / GLYCOLAX) 17 g packet Take 17 g by mouth daily as needed for moderate constipation.    [provider]  sodium chloride 0.9 % SOLN 50 mL with thiamine 100 MG/ML SOLN 500 mg Inject 500 mg into the vein daily. 04/25/22   Val Riles, MD  tamsulosin (FLOMAX) 0.4 MG CAPS capsule Take 0.4 mg by mouth daily.    [provider]  valproate 500 mg in dextrose 5 % 50 mL Inject 500 mg into the vein every 12 (twelve) hours. 04/25/22   Val Riles, MD     This patient is critically ill with multiple organ system failure which requires frequent high complexity decision making, assessment, support, evaluation, and titration of therapies. This was completed through the application of advanced monitoring technologies and extensive interpretation of multiple databases.  During this encounter critical care time was devoted to patient care services described in this note for 36 minutes.  Jacky Kindle, MD [facility] See Amion for pager If no response to pager, please call [phone] until 7pm After 7pm, Please call E-link [phone]

## 2022-04-25 NOTE — Consult Note (Signed)
NAME:  Oscar Elliott, MRN:  [id number], DOB:  October 10, 1960, LOS: 2 ADMISSION DATE:  04/23/2022  CHIEF COMPLAINT:  acute mental status changes    History of Present Illness:  62 y.o. male with Past medical history of schizophrenia, hypertension, hyperlipidemia, diabetes, and peripheral vascular disease status post left AKA who presents to the ED complaining of tremor.    HPI reviewed from ER physician's chart, patient is unable to provide any details, patient is AAO x 1, due to schizophrenia.   NEUROLOGY CONSULTED  Significant Hospital Events: Including procedures, antibiotic start and stop dates in addition to other pertinent events    2/26 As per ED physician patient was having jerking of arms and right leg, so he was brought in to the ED.  Patient had headache around 10 AM, patient also stated having tremors for a long time to the EMS.  Patient was noticed to have elevated to movements of his face, numbness and right leg, s/p Left BKA  2/27 remained encephalopathic  2/28 remains encephalopathic, seems to have had a seizure transfer to SD/ICU for mental status changes     Micro Data:  COVID FLU NEG  Antimicrobials:   Antibiotics Given (last 72 hours)     Date/Time Action Medication Dose Rate   04/23/22 1555 New Bag/Given   ceFEPIme (MAXIPIME) 2 g in sodium chloride 0.9 % 100 mL IVPB 2 g 200 mL/hr   04/23/22 1558 New Bag/Given   metroNIDAZOLE (FLAGYL) IVPB 500 mg 500 mg 100 mL/hr   04/23/22 1603 New Bag/Given   vancomycin (VANCOCIN) IVPB 1000 mg/200 mL premix 1,000 mg 200 mL/hr   04/23/22 2223 New Bag/Given   vancomycin (VANCOREADY) IVPB 500 mg/100 mL 500 mg 100 mL/hr   04/24/22 0204 New Bag/Given   piperacillin-tazobactam (ZOSYN) IVPB 3.375 g 3.375 g 100 mL/hr   04/24/22 1321 New Bag/Given   cefTRIAXone (ROCEPHIN) 2 g in sodium chloride 0.9 % 100 mL IVPB 2 g 200 mL/hr          Objective   Blood pressure 117/73, pulse 98, temperature 99 F (37.2 C), temperature  source Axillary, resp. rate 16, height '5\' 1"'$  (1.549 m), weight 53.7 kg, SpO2 98 %.        Intake/Output Summary (Last 24 hours) at 04/25/2022 1423 Last data filed at 04/25/2022 0750 Gross per 24 hour  Intake 1777.32 ml  Output 201 ml  Net 1576.32 ml   Filed Weights   04/23/22 1322 04/23/22 2141 04/24/22 0550  Weight: 59 kg 53.7 kg 53.7 kg     REVIEW OF SYSTEMS  PATIENT IS UNABLE TO PROVIDE COMPLETE REVIEW OF SYSTEMS DUE TO SEVERE CRITICAL ILLNESS   PHYSICAL EXAMINATION:  GENERAL:critically ill appearing EYES: Pupils equal, round, reactive to light.  No scleral icterus.  MOUTH: Moist mucosal membrane.  NECK: Supple.  PULMONARY: Lungs clear to auscultation, +rhonchi, +wheezing CARDIOVASCULAR: S1 and S2.  Regular rate and rhythm GASTROINTESTINAL: Soft, nontender, -distended. Positive bowel sounds.  MUSCULOSKELETAL: No swelling, clubbing, or edema.  NEUROLOGIC: obtunded GCS 6 SKIN: LEFT BKA   Labs/imaging that I havepersonally reviewed  (right click and "Reselect all SmartList Selections" daily)   ASSESSMENT AND PLAN SYNOPSIS  TRANSFERRED TO ICU FOR ACUTE METABOLIC ENCEPHALOPATHY WITH PROBABLE SEIZURES FOLLOW UP NEURO RECS-ANTI SEIZURE MEDS AND TRANSFER TO [facility] EEG/CERIBEL AVOID SECONDARY BRAIN INJURY OXYGEN AS NEEDED VS stable Check UDS Check CK  ACUTE KIDNEY INJURY/Renal Failure -continue Foley Catheter-assess need -Avoid nephrotoxic agents -Follow urine output, BMP -Ensure adequate renal perfusion, optimize  oxygenation -Renal dose medications Start BiCARB infusion x 6 hrs   Intake/Output Summary (Last 24 hours) at 04/25/2022 1438 Last data filed at 04/25/2022 0750 Gross per 24 hour  Intake 1777.32 ml  Output 201 ml  Net 1576.32 ml     PATIENT IS PROTECTING AIRWAY AT THIS TIME REMAINS POST ICTAL   ?SEVERE ALCOHOL WITHDRAWAL -Therapy with Thiamine and MVI -can lead to High risk for intubation  -can lead to high risk for aspiration  CARDIAC ICU  monitoring  INFECTIOUS DISEASE +UTI -continue antibiotics as prescribed -follow up cultures   ENDO - ICU hypoglycemic\Hyperglycemia protocol -check FSBS per protocol   GI GI PROPHYLAXIS as indicated  NUTRITIONAL STATUS DIET-->NPO Constipation protocol as indicated   ELECTROLYTES -follow labs as needed -replace as needed -pharmacy consultation and following   ACUTE ANEMIA- TRANSFUSE AS NEEDED   Best practice (right click and "Reselect all SmartList Selections" daily)  Diet: NPO Mobility:  bed rest  Code Status:  FULL Disposition:ICU  Labs   CBC: Recent Labs  Lab 04/23/22 1327 04/24/22 0326 04/25/22 0618  WBC 8.4 7.0 7.1  NEUTROABS 7.1  --   --   HGB 10.5* 9.0* 9.2*  HCT 31.0* 26.3* 27.4*  MCV 86.6 84.8 86.4  PLT 89* 58* 73*    Basic Metabolic Panel: Recent Labs  Lab 04/23/22 1327 04/24/22 0326 04/25/22 0618  NA 137 139 149*  K 3.5 3.7 3.1*  CL 102 109 119*  CO2 23 21* 20*  GLUCOSE 193* 173* 104*  BUN 56* 51* 44*  CREATININE 3.20* 2.83* 2.21*  CALCIUM 8.3* 7.6* 7.8*  MG 2.1 2.0 2.5*  PHOS  --  3.8 2.6   GFR: Estimated Creatinine Clearance: 25.6 mL/min (A) (by C-G formula based on SCr of 2.21 mg/dL (H)). Recent Labs  Lab 04/23/22 1327 04/23/22 1553 04/24/22 0326 04/25/22 0618  PROCALCITON 20.51  --   --   --   WBC 8.4  --  7.0 7.1  LATICACIDVEN 2.9* 1.7 1.3  --     Liver Function Tests: Recent Labs  Lab 04/23/22 1327  AST 45*  ALT 30  ALKPHOS 87  BILITOT 0.8  PROT 6.5  ALBUMIN 3.0*   No results for input(s): "LIPASE", "AMYLASE" in the last 168 hours. No results for input(s): "AMMONIA" in the last 168 hours.  ABG    Component Value Date/Time   PHART 7.29 (L) 04/25/2022 1410   PCO2ART 29 (L) 04/25/2022 1410   PO2ART 98 04/25/2022 1410   HCO3 13.9 (L) 04/25/2022 1410   TCO2 22 11/20/2013 1632   ACIDBASEDEF 11.2 (H) 04/25/2022 1410   O2SAT 97.8 04/25/2022 1410     Coagulation Profile: No results for input(s): "INR",  "PROTIME" in the last 168 hours.  Cardiac Enzymes: Recent Labs  Lab 04/23/22 1327 04/24/22 0326  CKTOTAL 491* 323    HbA1C: Hgb A1c MFr Bld  Date/Time Value Ref Range Status  04/23/2022 01:27 PM 6.7 (H) 4.8 - 5.6 % Final    Comment:    (NOTE)         Prediabetes: 5.7 - 6.4         Diabetes: >6.4         Glycemic control for adults with diabetes: <7.0   02/23/2014 12:54 AM 5.8 (H) <5.7 % Final    Comment:    (NOTE)  According to the ADA Clinical Practice Recommendations for 2011, when HbA1c is used as a screening test:  >=6.5%   Diagnostic of Diabetes Mellitus           (if abnormal result is confirmed) 5.7-6.4%   Increased risk of developing Diabetes Mellitus References:Diagnosis and Classification of Diabetes Mellitus,Diabetes [id number] 1):S62-S69 and Standards of Medical Care in         Diabetes - 2011,Diabetes [id number] (Suppl 1):S11-S61.     CBG: Recent Labs  Lab 04/24/22 1557 04/24/22 2133 04/25/22 0737 04/25/22 1141 04/25/22 1358  GLUCAP 112* 110* 14 125* 116*     Past Medical History:  He,  has a past medical history of CAD (coronary artery disease), Cholelithiasis, Essential hypertension, History of renal failure, Hyperlipidemia, MI (myocardial infarction) ([place]), Nephrolithiasis, Peripheral vascular disease ([place]), Schizophrenia ([place]), and Type 2 diabetes mellitus ([place]).   Surgical History:   Past Surgical History:  Procedure Laterality Date   Above-the-knee amputation Left    COLONOSCOPY WITH PROPOFOL N/A 12/09/2018   fields: nonthrombosed hemorrhoids, decreased anal sphincter tone. poor prep, colonoscopy aborted   FLEXIBLE SIGMOIDOSCOPY  11/17/2019   Procedure: FLEXIBLE SIGMOIDOSCOPY;  Surgeon: Eloise Harman, DO;  Location: [facility];  Service: Endoscopy;;   LEFT HEART CATHETERIZATION WITH CORONARY ANGIOGRAM N/A 11/20/2013   Procedure: LEFT HEART CATHETERIZATION  WITH CORONARY ANGIOGRAM;  Surgeon: Jettie Booze, MD;  Location[facility] C[facility] LAB;  Service: Cardiovascular;  Laterality: N/A;     Social History:   reports that he has been smoking cigars and cigarettes. He has a 5.00 pack-year smoking history. He has never used smokeless tobacco. He reports that he does not drink alcohol and does not use drugs.   Family History:  His family history includes Other in an other family member.   Allergies Allergies  Allergen Reactions   Penicillins Other (See Comments)    DIZZINESS      Home Medications  Prior to Admission medications   Medication Sig Start Date End Date Taking? Authorizing Provider  ARIPiprazole (ABILIFY) 15 MG tablet Take 1 tablet (15 mg total) by mouth daily. 05/04/20  Yes Cloria Spring, MD  aspirin EC 81 MG tablet Take 81 mg by mouth daily.   Yes [provider]  atorvastatin (LIPITOR) 80 MG tablet Take 1 tablet (80 mg total) by mouth daily at 6 PM. 05/31/16  Yes Memon, Jolaine Artist, MD  clonazePAM (KLONOPIN) 0.5 MG tablet Take 1 tablet (0.5 mg total) by mouth at bedtime. 05/04/20  Yes Cloria Spring, MD  clopidogrel (PLAVIX) 75 MG tablet Take 75 mg by mouth daily.   Yes [provider]  cloZAPine (CLOZARIL) 100 MG tablet 100 mg in AM and 500 mg at night Patient taking differently: Take 100-500 mg by mouth See admin instructions. Take 100 mg by mouth in morning and 500 mg at night 03/26/19  Yes Nevada Crane, MD  docusate sodium (COLACE) 100 MG capsule Take 100 mg by mouth 2 (two) times daily.   Yes [provider]  DULoxetine (CYMBALTA) 60 MG capsule Take 2 capsules (120 mg total) by mouth daily. 08/20/19  Yes Norman Clay, MD  gabapentin (NEURONTIN) 100 MG capsule Take 100 mg by mouth 2 (two) times daily.   Yes [provider]  insulin detemir (LEVEMIR) 100 UNIT/ML injection Inject 0.3 mLs (30 Units total) into the skin daily. Patient taking differently: Inject 33 Units into the skin daily. Hold if pt  does not eat . 05/31/16  Yes Kathie Dike,  MD  latanoprost (XALATAN) 0.005 % ophthalmic solution Place 1 drop into both eyes at bedtime.   Yes [provider]  linaclotide (LINZESS) 145 MCG CAPS capsule TAKE 1 CAPSULE BY MOUTH ONCE DAILY BEFORE BREAKFAST. 07/10/21  Yes Mahala Menghini, PA-C  lisinopril (ZESTRIL) 10 MG tablet Take 10 mg by mouth daily. Hold if systolic is less than [id number]   Yes [provider]  metoprolol (LOPRESSOR) 50 MG tablet Take 1 tablet (50 mg total) by mouth 2 (two) times daily. 12/02/13  Yes Imogene Burn, PA-C  omeprazole (PRILOSEC) 40 MG capsule Take 40 mg by mouth 2 (two) times daily.    Yes [provider]  tamsulosin (FLOMAX) 0.4 MG CAPS capsule Take 0.4 mg by mouth daily.   Yes [provider]  acetaminophen (TYLENOL) 500 MG tablet Take 500 mg by mouth every 4 (four) hours as needed for moderate pain.    [provider]  Cholecalciferol (VITAMIN D) 50 MCG (2000 UT) CAPS Take 2,000 Units by mouth once a week.     [provider]  diclofenac Sodium (VOLTAREN) 1 % GEL Apply 2 g topically 4 (four) times daily as needed (pain).  Patient not taking: Reported on 04/23/2022    [provider]  [facility] TEST test strip 1 each by Other route as needed. 07/06/20   [provider]  insulin aspart (NOVOLOG) 100 UNIT/ML injection Inject 2 Units into the skin 3 (three) times daily before meals. Patient not taking: Reported on 04/23/2022 06/01/16   Kathie Dike, MD  ketoconazole (NIZORAL) 2 % shampoo Apply 1 application topically 2 (two) times a week.    [provider]  nitroGLYCERIN (NITROSTAT) 0.4 MG SL tablet Place 1 tablet (0.4 mg total) under the tongue every 5 (five) minutes x 3 doses as needed for chest pain. 11/23/13   Barrett, Evelene Croon, PA-C  polyethylene glycol (MIRALAX / GLYCOLAX) 17 g packet Take 17 g by mouth daily as needed for moderate constipation.    [provider]        Critical Care Time devoted to patient care services described in this note is 75 minutes.   Critical care was necessary to treat /prevent imminent and life-threatening deterioration.     Corrin Parker, M.D.  [facility]  Medical Director [place] Director [facility] Cardio-Pulmonary Department

## 2022-04-25 NOTE — Plan of Care (Signed)
Case Discussed with ICU at [facility] We will upgrade bed to ICU to status -High risk for intubation  -high risk for aspiration

## 2022-04-25 NOTE — Inpatient Diabetes Management (Signed)
Inpatient Diabetes Program Recommendations  AACE/ADA: New Consensus Statement on Inpatient Glycemic Control (2015)  Target Ranges:  Prepandial:   less than 140 mg/dL      Peak postprandial:   less than 180 mg/dL (1-2 hours)      Critically ill patients:  140 - 180 mg/dL   Lab Results  Component Value Date   GLUCAP 125 (H) 04/25/2022   HGBA1C 6.7 (H) 04/23/2022    Review of Glycemic Control  Latest Reference Range & Units 04/24/22 08:01 04/24/22 11:51 04/24/22 15:57 04/24/22 21:33 04/25/22 07:37 04/25/22 11:41  Glucose-Capillary 70 - 99 mg/dL 147 (H) 145 (H) 112 (H) 110 (H) 89 125 (H)  (H): Data is abnormally high  Diabetes history: DM2 Outpatient Diabetes medications: Levemir 33 units QD Current orders for Inpatient glycemic control: Novolog 0-15 units TID  Received consult for DM management.  Agree with current orders.  Glucose is trending well.  DM coordinator will sign off.  Please re-consult if further needs should arise.   Will continue to follow while inpatient.  Thank you, Reche Dixon, MSN, [place] Diabetes Coordinator Inpatient Diabetes Program [phone] (team pager from 8a-5p)

## 2022-04-25 NOTE — Consult Note (Signed)
[facility] Consult   Reason for Consult: Consult for 62 year old man with a history of a diagnosis of schizophrenia who was brought from his group home living situation with a history of new onset tremors and altered mental status Referring Physician: Kapour Patient Identification: Oscar Elliott MRN:  [id number] Principal Diagnosis: Altered mental status Diagnosis:  Principal Problem:   Altered mental status Active Problems:   Schizophrenia ([place])   Sepsis ([place])   Total Time spent with patient: 45 minutes  Subjective:   Oscar Elliott is a 62 y.o. male patient admitted with patient is unable to give any information.  Not responsive.Oscar Elliott  HPI: 62 year old man with a known longstanding history of schizophrenia was brought from his group home with reports of changes in mental status and new onset tremor.  Reviewed chart but it sounds like the history was a little unclear as to how quickly this came on.  In the emergency room patient was documented as being able to speak although confused and not able to give a lucid history then either.  Came by to see this patient this afternoon and found him to be largely unresponsive.  Said his name multiple times loudly and he briefly seemed to open his eyes but made no eye contact.  Follow no commands including even squeezing my hand.  Brief incomplete exam patient is not stiff.  He is having frequent myoclonic jerks mostly on the right side of the body in both the face and chest and upper extremity area mostly.  Review of labs.  Multiple abnormal chemistries which have continued to be abnormal for the last couple days.  Patient has presented with low calcium, normal magnesium, impaired renal function and specifically much worse than just a few months ago.  Patient is also anemic although white count is normal and absolute neutrophil count is fine.  No report of any substance abuse no evidence of any substance abuse.  Medicines reviewed.  Patient  is on multiple medications including multiple psychiatric medicines but it appears that all of them have been the same for quite a while in at least the last month and there is no documentation of skipping them or recent changes in medicine.  Past Psychiatric History: Patient has a past history of a diagnosis of schizophrenia.  Last definite documentation I have was from when he was going to the clinic at our hospital a couple years ago.  Appeared to be in much more normal mental state at that time.  Documented in the controlled substance database that he has continued to be on his standard clonazepam half a milligram a day.  Risk to Self:   Risk to Others:   Prior Inpatient Therapy:   Prior Outpatient Therapy:    Past Medical History:  Past Medical History:  Diagnosis Date   CAD (coronary artery disease)    a. 10/2013 Lateral STEMI/PCI: LM nl, LAD min irregs, D1 occluded sub branch, LCX min irregs mid, OM large/patent, RCA 156m(2.75x28 Promus DES).   Cholelithiasis    Essential hypertension    History of renal failure    a. 06/2007: in setting of rhabdo post L AKA.   Hyperlipidemia    [place] infarction) ([place]    Nephrolithiasis    Peripheral vascular disease ([place]    a. 06/2007: s/p L AKA @ [facility]   Schizophrenia ([place]    Type 2 diabetes mellitus ([place]     Past Surgical History:  Procedure Laterality Date   Above-the-knee amputation Left  COLONOSCOPY WITH PROPOFOL N/A 12/09/2018   fields: nonthrombosed hemorrhoids, decreased anal sphincter tone. poor prep, colonoscopy aborted   FLEXIBLE SIGMOIDOSCOPY  11/17/2019   Procedure: FLEXIBLE SIGMOIDOSCOPY;  Surgeon: Eloise Harman, DO;  [facility];  Service: Endoscopy;;   LEFT HEART CATHETERIZATION WITH CORONARY ANGIOGRAM N/A 11/20/2013   Procedure: LEFT HEART CATHETERIZATION WITH CORONARY ANGIOGRAM;  Surgeon: Jettie Booze, MD;  Location: [facility] CATH LAB;  Service: Cardiovascular;  Laterality: N/A;   Family History:   Family History  Problem Relation Age of Onset   Other Other        no premature CAD.   Family Psychiatric  History: None noted Social History:  Social History   Substance and Sexual Activity  Alcohol Use No   Alcohol/week: 0.0 standard drinks of alcohol     Social History   Substance and Sexual Activity  Drug Use No    Social History   Socioeconomic History   Marital status: Single    Spouse name: Not on file   Number of children: Not on file   Years of education: Not on file   Highest education level: Not on file  Occupational History   Not on file  Tobacco Use   Smoking status: Every Day    Packs/day: 0.25    Years: 20.00    Total pack years: 5.00    Types: Cigars, Cigarettes   Smokeless tobacco: Never   Tobacco comments:    1-2 cig daily  Vaping Use   Vaping Use: Never used  Substance and Sexual Activity   Alcohol use: No    Alcohol/week: 0.0 standard drinks of alcohol   Drug use: No   Sexual activity: Never  Other Topics Concern   Not on file  Social History Narrative   Single, no children   Social Determinants of Health   Financial Resource Strain: Not on file  Food Insecurity: Not on file  Transportation Needs: Not on file  Physical Activity: Not on file  Stress: Not on file  Social Connections: Not on file   Additional Social History:    Allergies:   Allergies  Allergen Reactions   Penicillins Other (See Comments)    DIZZINESS     Labs:  Results for orders placed or performed during the hospital encounter of 04/23/22 (from the past 48 hour(s))  Lactic acid, plasma     Status: None   Collection Time: 04/23/22  3:53 PM  Result Value Ref Range   Lactic Acid, Venous 1.7 0.5 - 1.9 mmol/L    Comment: Performed at [facility], [address], [place], [place] [address]  Urine Culture (for pregnant, neutropenic or urologic patients or patients with an indwelling urinary catheter)     Status: Abnormal (Preliminary result)    Collection Time: 04/23/22  3:53 PM   Specimen: Urine, Clean Catch  Result Value Ref Range   Specimen Description      URINE, CLEAN CATCH Performed at [facility], [address], [place], [place] [address]    Special Requests      NONE Performed at [facility], [address], [place], [place] [address]    Culture (A)     >=100,000 COLONIES/mL ESCHERICHIA COLI SUSCEPTIBILITIES TO FOLLOW Performed at [place] [address], [place], [place] [address]    Report Status PENDING   Urinalysis, Routine w reflex microscopic -Urine, Clean Catch     Status: Abnormal   Collection Time: 04/23/22  3:54  PM  Result Value Ref Range   Color, Urine AMBER (A) YELLOW    Comment: BIOCHEMICALS MAY BE AFFECTED BY COLOR   APPearance CLOUDY (A) CLEAR   Specific Gravity, Urine 1.014 1.005 - 1.030   pH 5.0 5.0 - 8.0   Glucose, UA NEGATIVE NEGATIVE mg/dL   Hgb urine dipstick MODERATE (A) NEGATIVE   Bilirubin Urine NEGATIVE NEGATIVE   Ketones, ur NEGATIVE NEGATIVE mg/dL   Protein, ur 100 (A) NEGATIVE mg/dL   Nitrite NEGATIVE NEGATIVE   Leukocytes,Ua LARGE (A) NEGATIVE   RBC / HPF 6-10 0 - 5 RBC/hpf   WBC, UA >50 0 - 5 WBC/hpf   Bacteria, UA FEW (A) NONE SEEN   Squamous Epithelial / HPF 0-5 0 - 5 /HPF   Mucus PRESENT    Amorphous Crystal PRESENT     Comment: Performed at [facility], [address] [place], [place] [address]  Culture, blood (routine x 2)     Status: Abnormal (Preliminary result)   Collection Time: 04/23/22  3:54 PM   Specimen: BLOOD  Result Value Ref Range   Specimen Description      BLOOD BLOOD RIGHT ARM Performed at [facility], [address], [place], [place] [address]    Special Requests      BOTTLES DRAWN AEROBIC AND ANAEROBIC Blood Culture adequate volume Performed at [facility], [address] [place], [place] [address]    Culture  Setup Time      GRAM NEGATIVE RODS IN BOTH AEROBIC AND ANAEROBIC  BOTTLES CRITICAL RESULT CALLED TO, READ BACK BY AND VERIFIED WITHPamelia Hoit [facility] [id number] 04/24/22 HNM Performed at [facility], [address], [place], [place] [address]    Culture (A)     ESCHERICHIA COLI SUSCEPTIBILITIES TO FOLLOW Performed at [place] [address], [place], [place] [address]    Report Status PENDING   Culture, blood (routine x 2)     Status: Abnormal (Preliminary result)   Collection Time: 04/23/22  3:54 PM   Specimen: BLOOD  Result Value Ref Range   Specimen Description      BLOOD BLOOD LEFT ARM Performed at [facility], [address], [place], [place] [address]    Special Requests      BOTTLES DRAWN AEROBIC AND ANAEROBIC Blood Culture adequate volume Performed at [facility], [address], [place], [place] [address]    Culture  Setup Time      GRAM NEGATIVE RODS IN BOTH AEROBIC AND ANAEROBIC BOTTLES CRITICAL VALUE NOTED.  VALUE IS CONSISTENT WITH PREVIOUSLY REPORTED AND CALLED VALUE. Performed at [facility], [address] [place], [place] [address]    Culture ESCHERICHIA COLI (A)    Report Status PENDING   Blood Culture ID Panel (Reflexed)     Status: Abnormal   Collection Time: 04/23/22  3:54 PM  Result Value Ref Range   Enterococcus faecalis NOT DETECTED NOT DETECTED   Enterococcus Faecium NOT DETECTED NOT DETECTED   Listeria monocytogenes NOT DETECTED NOT DETECTED   Staphylococcus species NOT DETECTED NOT DETECTED   Staphylococcus aureus (BCID) NOT DETECTED NOT DETECTED   Staphylococcus epidermidis NOT DETECTED NOT DETECTED   Staphylococcus lugdunensis NOT DETECTED NOT DETECTED   Streptococcus species NOT DETECTED NOT DETECTED   Streptococcus agalactiae NOT DETECTED NOT DETECTED   Streptococcus pneumoniae NOT DETECTED NOT DETECTED   Streptococcus pyogenes NOT DETECTED NOT DETECTED   A.calcoaceticus-baumannii NOT DETECTED NOT DETECTED   Bacteroides fragilis  NOT DETECTED NOT DETECTED    Enterobacterales DETECTED (A) NOT DETECTED    Comment: Enterobacterales represent a large order of gram negative bacteria, not a single organism. CRITICAL RESULT CALLED TO, READ BACK BY AND VERIFIED WITH: Pamelia Hoit PHARMD [id number] 04/24/22 HNM CORRECTED ON 02/27 AT [id number]: PREVIOUSLY REPORTED AS DETECTED Enterobacterales represent a large order of gram negative bacteria, not a single organism. CRITICAL RESULT CALLED TO, READ BACK BY AND VERIFIED WITH: MORGAN GOBBLE PHARMD 04/24/22 HNM    Enterobacter cloacae complex NOT DETECTED NOT DETECTED   Escherichia coli DETECTED (A) NOT DETECTED    Comment: CRITICAL RESULT CALLED TO, READ BACK BY AND VERIFIED WITH: Pamelia Hoit PHARMD [id number] 04/24/22 HNM CORRECTED ON 02/27 AT [id number]: PREVIOUSLY REPORTED AS DETECTED CRITICAL RESULT CALLED TO, READ BACK BY AND VERIFIED WITH: MORGAN GOBBLE PHARMD 04/24/22 HNM    Klebsiella aerogenes NOT DETECTED NOT DETECTED   Klebsiella oxytoca NOT DETECTED NOT DETECTED   Klebsiella pneumoniae NOT DETECTED NOT DETECTED   Proteus species NOT DETECTED NOT DETECTED   Salmonella species NOT DETECTED NOT DETECTED   Serratia marcescens NOT DETECTED NOT DETECTED   Haemophilus influenzae NOT DETECTED NOT DETECTED   Neisseria meningitidis NOT DETECTED NOT DETECTED   Pseudomonas aeruginosa NOT DETECTED NOT DETECTED   Stenotrophomonas maltophilia NOT DETECTED NOT DETECTED   Candida albicans NOT DETECTED NOT DETECTED   Candida auris NOT DETECTED NOT DETECTED   Candida glabrata NOT DETECTED NOT DETECTED   Candida krusei NOT DETECTED NOT DETECTED   Candida parapsilosis NOT DETECTED NOT DETECTED   Candida tropicalis NOT DETECTED NOT DETECTED   Cryptococcus neoformans/gattii NOT DETECTED NOT DETECTED   CTX-M ESBL NOT DETECTED NOT DETECTED   Carbapenem resistance IMP NOT DETECTED NOT DETECTED   Carbapenem resistance KPC NOT DETECTED NOT DETECTED   Carbapenem resistance NDM NOT DETECTED NOT DETECTED   Carbapenem resist OXA 48 LIKE NOT  DETECTED NOT DETECTED   Carbapenem resistance VIM NOT DETECTED NOT DETECTED    Comment: Performed at [facility], [address] [place], [place] [address]  Glucose, capillary     Status: Abnormal   Collection Time: 04/23/22  9:40 PM  Result Value Ref Range   Glucose-Capillary 184 (H) 70 - 99 mg/dL    Comment: Glucose reference range applies only to samples taken after fasting for at least 8 hours.  CBC     Status: Abnormal   Collection Time: 04/24/22  3:26 AM  Result Value Ref Range   WBC 7.0 4.0 - 10.5 K/uL   RBC 3.10 (L) 4.22 - 5.81 MIL/uL   Hemoglobin 9.0 (L) 13.0 - 17.0 g/dL   HCT 26.3 (L) 39.0 - 52.0 %   MCV 84.8 80.0 - 100.0 fL   MCH 29.0 26.0 - 34.0 pg   MCHC 34.2 30.0 - 36.0 g/dL   RDW 15.8 (H) 11.5 - 15.5 %   Platelets 58 (L) 150 - 400 K/uL    Comment: Immature Platelet Fraction may be clinically indicated, consider ordering this additional test [id number]    nRBC 0.3 (H) 0.0 - 0.2 %    Comment: Performed at [facility], [address], [place], [place] [id number]  Basic metabolic panel     Status: Abnormal   Collection Time: 04/24/22  3:26 AM  Result Value Ref Range   Sodium 139 135 - 145 mmol/L   Potassium 3.7 3.5 - 5.1 mmol/L   Chloride 109 98 - 111 mmol/L   CO2 21 (L) 22 - 32 mmol/L  Glucose, Bld 173 (H) 70 - 99 mg/dL    Comment: Glucose reference range applies only to samples taken after fasting for at least 8 hours.   BUN 51 (H) 8 - 23 mg/dL   Creatinine, Ser 2.83 (H) 0.61 - 1.24 mg/dL   Calcium 7.6 (L) 8.9 - 10.3 mg/dL   GFR, Estimated 24 (L) >60 mL/min    Comment: (NOTE) Calculated using the CKD-EPI Creatinine Equation (2021)    Anion gap 9 5 - 15    Comment: Performed at [facility], [address] [place], [place] [address]  Magnesium     Status: None   Collection Time: 04/24/22  3:26 AM  Result Value Ref Range   Magnesium 2.0 1.7 - 2.4 mg/dL    Comment: Performed at [facility], [address]  [place], [place] [address]  Phosphorus     Status: None   Collection Time: 04/24/22  3:26 AM  Result Value Ref Range   Phosphorus 3.8 2.5 - 4.6 mg/dL    Comment: Performed at [facility], [address] [place], [place] [address]  CK     Status: None   Collection Time: 04/24/22  3:26 AM  Result Value Ref Range   Total CK 323 49 - 397 U/L    Comment: Performed at [facility], [address] [place], [place] [address]  Lactic acid, plasma     Status: None   Collection Time: 04/24/22  3:26 AM  Result Value Ref Range   Lactic Acid, Venous 1.3 0.5 - 1.9 mmol/L    Comment: Performed at [facility], [address] [place], [place] [address]  HIV Antibody (routine testing w rflx)     Status: None   Collection Time: 04/24/22  3:26 AM  Result Value Ref Range   HIV Screen 4th Generation wRfx Non Reactive Non Reactive    Comment: Performed at [facility], [place] [address] [place], [place] [address]  Iron and TIBC     Status: Abnormal   Collection Time: 04/24/22  3:26 AM  Result Value Ref Range   Iron 10 (L) 45 - 182 ug/dL   TIBC 154 (L) 250 - 450 ug/dL   Saturation Ratios 7 (L) 17.9 - 39.5 %   UIBC 144 ug/dL    Comment: Performed at [facility], [address] [place], [place] [address]  Folate     Status: None   Collection Time: 04/24/22  3:26 AM  Result Value Ref Range   Folate 16.6 >5.9 ng/mL    Comment: Performed at [facility], [address] [place], [place] [address]  Glucose, capillary     Status: Abnormal   Collection Time: 04/24/22  8:01 AM  Result Value Ref Range   Glucose-Capillary 147 (H) 70 - 99 mg/dL    Comment: Glucose reference range applies only to samples taken after fasting for at least 8 hours.  VITAMIN D 25 Hydroxy (Vit-D Deficiency, Fractures)     Status: None   Collection Time: 04/24/22  9:00 AM  Result Value Ref Range   Vit D, 25-Hydroxy 57.15 30 - 100 ng/mL    Comment: (NOTE) Vitamin D deficiency has  been defined by the [place] practice guideline as a level of serum 25-OH  vitamin D less than 20 ng/mL (1,2). The Endocrine Society went on to  further define vitamin D insufficiency as a level between 21 and 29  ng/mL (2).  1. IOM (Institute of Medicine). 2010. Dietary reference intakes for  calcium and D. [place]: The [employer]. 2. Holick MF, Binkley Blomkest, Bischoff-Ferrari HA, et al. Evaluation,  treatment, and prevention of vitamin D deficiency: an Endocrine  Society clinical practice guideline, JCEM. 2011 Jul; 96(7): 1911-30.  Performed at [facility], [place] [address], [place], [place] [address]   Vitamin B12     Status: None   Collection Time: 04/24/22  9:00 AM  Result Value Ref Range   Vitamin B-12 308 180 - 914 pg/mL    Comment: (NOTE) This assay is not validated for testing neonatal or myeloproliferative syndrome specimens for Vitamin B12 levels. Performed at [facility], [place] [address] [place], [place] [address]   Glucose, capillary     Status: Abnormal   Collection Time: 04/24/22 11:51 AM  Result Value Ref Range   Glucose-Capillary 145 (H) 70 - 99 mg/dL    Comment: Glucose reference range applies only to samples taken after fasting for at least 8 hours.  Glucose, capillary     Status: Abnormal   Collection Time: 04/24/22  3:57 PM  Result Value Ref Range   Glucose-Capillary 112 (H) 70 - 99 mg/dL    Comment: Glucose reference range applies only to samples taken after fasting for at least 8 hours.  Glucose, capillary     Status: Abnormal   Collection Time: 04/24/22  9:33 PM  Result Value Ref Range   Glucose-Capillary 110 (H) 70 - 99 mg/dL    Comment: Glucose reference range applies only to samples taken after fasting for at least 8 hours.  CBC     Status: Abnormal   Collection Time: 04/25/22  6:18 AM  Result Value Ref Range   WBC 7.1 4.0 - 10.5 K/uL   RBC 3.17 (L) 4.22 - 5.81 MIL/uL    Hemoglobin 9.2 (L) 13.0 - 17.0 g/dL   HCT 27.4 (L) 39.0 - 52.0 %   MCV 86.4 80.0 - 100.0 fL   MCH 29.0 26.0 - 34.0 pg   MCHC 33.6 30.0 - 36.0 g/dL   RDW 15.9 (H) 11.5 - 15.5 %   Platelets 73 (L) 150 - 400 K/uL    Comment: Immature Platelet Fraction may be clinically indicated, consider ordering this additional test [id number] CONSISTENT WITH PREVIOUS RESULT    nRBC 0.3 (H) 0.0 - 0.2 %    Comment: Performed at [facility], [address] [place], [place] [id number]  Basic metabolic panel     Status: Abnormal   Collection Time: 04/25/22  6:18 AM  Result Value Ref Range   Sodium 149 (H) 135 - 145 mmol/L   Potassium 3.1 (L) 3.5 - 5.1 mmol/L   Chloride 119 (H) 98 - 111 mmol/L   CO2 20 (L) 22 - 32 mmol/L   Glucose, Bld 104 (H) 70 - 99 mg/dL    Comment: Glucose reference range applies only to samples taken after fasting for at least 8 hours.   BUN 44 (H) 8 - 23 mg/dL   Creatinine, Ser 2.21 (H) 0.61 - 1.24 mg/dL   Calcium 7.8 (L) 8.9 - 10.3 mg/dL   GFR, Estimated 33 (L) >60 mL/min    Comment: (NOTE) Calculated using the CKD-EPI Creatinine Equation (2021)    Anion gap 10 5 - 15    Comment: Performed at [facility], [address], [place], [place] [address]  Magnesium     Status: Abnormal  Collection Time: 04/25/22  6:18 AM  Result Value Ref Range   Magnesium 2.5 (H) 1.7 - 2.4 mg/dL    Comment: Performed at [facility], [address] [place], [place] [address]  Phosphorus     Status: None   Collection Time: 04/25/22  6:18 AM  Result Value Ref Range   Phosphorus 2.6 2.5 - 4.6 mg/dL    Comment: Performed at [facility], [address] [place], [place] [address]  Glucose, capillary     Status: None   Collection Time: 04/25/22  7:37 AM  Result Value Ref Range   Glucose-Capillary 89 70 - 99 mg/dL    Comment: Glucose reference range applies only to samples taken after fasting for at least 8 hours.   Comment 1 Notify RN    Comment 2  Document in Chart   Glucose, capillary     Status: Abnormal   Collection Time: 04/25/22 11:41 AM  Result Value Ref Range   Glucose-Capillary 125 (H) 70 - 99 mg/dL    Comment: Glucose reference range applies only to samples taken after fasting for at least 8 hours.    Current Facility-Administered Medications  Medication Dose Route Frequency Provider Last Rate Last Admin   0.9 %  sodium chloride infusion  250 mL Intravenous PRN Val Riles, MD       acetaminophen (TYLENOL) tablet 650 mg  650 mg Oral Q6H PRN Val Riles, MD   650 mg at 04/23/22 2346   Or   acetaminophen (TYLENOL) suppository 650 mg  650 mg Rectal Q6H PRN Val Riles, MD       aspirin EC tablet 81 mg  81 mg Oral Daily Val Riles, MD   81 mg at 04/25/22 0806   bisacodyl (DULCOLAX) EC tablet 10 mg  10 mg Oral QHS Val Riles, MD   10 mg at 04/24/22 2135   cefTRIAXone (ROCEPHIN) 2 g in sodium chloride 0.9 % 100 mL IVPB  2 g Intravenous Q24H Val Riles, MD 200 mL/hr at 04/24/22 1321 2 g at 04/24/22 1321   clonazePAM (KLONOPIN) disintegrating tablet 0.5 mg  0.5 mg Oral BID Rachell Druckenmiller, Madie Reno, MD       clopidogrel (PLAVIX) tablet 75 mg  75 mg Oral Daily Val Riles, MD   75 mg at 04/25/22 0809   cloZAPine (CLOZARIL) tablet 100 mg  100 mg Oral Daily Dorothe Pea, RPH   100 mg at 04/25/22 [id number]   cloZAPine (CLOZARIL) tablet 500 mg  500 mg Oral QHS Dorothe Pea, RPH   500 mg at 04/24/22 2135   cyanocobalamin (VITAMIN B12) tablet 1,000 mcg  1,000 mcg Oral Daily Val Riles, MD       gabapentin (NEURONTIN) capsule 100 mg  100 mg Oral BID Val Riles, MD   100 mg at 04/25/22 [id number]   insulin aspart (novoLOG) injection 0-15 Units  0-15 Units Subcutaneous TID WC Val Riles, MD   2 Units at 04/25/22 1253   latanoprost (XALATAN) 0.005 % ophthalmic solution 1 drop  1 drop Both Eyes QHS Val Riles, MD   1 drop at 04/24/22 2230   ondansetron (ZOFRAN) tablet 4 mg  4 mg Oral Q6H PRN Val Riles, MD       Or    ondansetron [facility]) injection 4 mg  4 mg Intravenous Q6H PRN Val Riles, MD       pantoprazole (PROTONIX) EC tablet 40 mg  40 mg Oral Daily Val Riles, MD   40 mg at  04/25/22 0807   polyethylene glycol (MIRALAX / GLYCOLAX) packet 17 g  17 g Oral Daily Val Riles, MD   17 g at 04/25/22 0809   potassium chloride SA (KLOR-CON M) CR tablet 40 mEq  40 mEq Oral Once Val Riles, MD       sodium chloride flush (NS) 0.9 % injection 3 mL  3 mL Intravenous Q12H Val Riles, MD   3 mL at 04/25/22 0810   sodium chloride flush (NS) 0.9 % injection 3 mL  3 mL Intravenous PRN Val Riles, MD       tamsulosin [facility]) capsule 0.4 mg  0.4 mg Oral Daily Val Riles, MD   0.4 mg at 04/25/22 0808    Musculoskeletal: Strength & Muscle Tone: decreased Gait & Station: unable to stand Patient leans: N/A            Psychiatric Specialty Exam:  Presentation  General Appearance: No data recorded Eye Contact:No data recorded Speech:No data recorded Speech Volume:No data recorded Handedness:No data recorded  Mood and Affect  Mood:No data recorded Affect:No data recorded  Thought Process  Thought Processes:No data recorded Descriptions of Associations:No data recorded Orientation:No data recorded Thought Content:No data recorded History of Schizophrenia/Schizoaffective disorder:No data recorded Duration of Psychotic Symptoms:No data recorded Hallucinations:No data recorded Ideas of Reference:No data recorded Suicidal Thoughts:No data recorded Homicidal Thoughts:No data recorded  Sensorium  Memory:No data recorded Judgment:No data recorded Insight:No data recorded  Executive Functions  Concentration:No data recorded Attention Span:No data recorded Recall:No data recorded Fund of Knowledge:No data recorded Language:No data recorded  Psychomotor Activity  Psychomotor Activity:No data recorded  Assets  Assets:No data recorded  Sleep  Sleep:No data  recorded  Physical Exam: Physical Exam Vitals and nursing note reviewed.  Constitutional:      Appearance: He is ill-appearing.  HENT:     Head: Normocephalic and atraumatic.     Mouth/Throat:     Pharynx: Oropharynx is clear.  Eyes:     Pupils: Pupils are equal, round, and reactive to light.  Cardiovascular:     Rate and Rhythm: Normal rate and regular rhythm.  Pulmonary:     Effort: Pulmonary effort is normal.     Breath sounds: Normal breath sounds.  Abdominal:     General: Abdomen is flat.     Palpations: Abdomen is soft.  Musculoskeletal:        General: Normal range of motion.  Skin:    General: Skin is warm and dry.  Neurological:     Mental Status: Mental status is at baseline.     Comments: Frequent myoclonic twitches which seem to be more prominent on the right side of the body.  Very brief.  Psychiatric:        Attention and Perception: He is inattentive.        Mood and Affect: Affect is blunt.        Speech: He is noncommunicative.    Review of Systems  Unable to perform ROS: Medical condition   Blood pressure 117/73, pulse 98, temperature 99 F (37.2 C), temperature source Axillary, resp. rate 16, height '5\' 1"'$  (1.549 m), weight 53.7 kg, SpO2 98 %. Body mass index is 22.37 kg/m.  Treatment Plan Summary: Medication management and Plan patient with altered mental status and multiple myoclonic jerks.  Presentation not consistent with schizophrenia or other typical mental health disorder.  Almost certainly medical although that could include medication related.  Some features could be consistent with serotonin syndrome although it is  not a great case most of his vitals are fairly stable.  Nevertheless for the moment I am going to discontinue the duloxetine and will also discontinue the Abilify on the rare outside chance that this could be an odd manifestation of NMS.  Clozapine dose is on the high side but it is documented that he has been taking it and this has  been a stable dose for years.  Unlikely to be anything related to that I would think.  Just as I was writing this note I was informed that his EEG done yesterday was read as having seizures.  Neurology is aware and he will be starting on anticonvulsant medicine.  I have ordered to restart his clonazepam at a slightly elevated dose.  We will follow-up as needed.  Disposition:  Will follow as needed.  No evidence at this point to suggest need for psychiatric hospitalization.  Alethia Berthold, MD 04/25/2022 1:36 PM

## 2022-04-26 ENCOUNTER — Inpatient Hospital Stay (HOSPITAL_COMMUNITY): Payer: Medicare (Managed Care)

## 2022-04-26 DIAGNOSIS — I5021 Acute systolic (congestive) heart failure: Secondary | ICD-10-CM

## 2022-04-26 LAB — URINE CULTURE
Culture: >=100000 — AB
Culture: NO GROWTH

## 2022-04-26 LAB — GLUCOSE, CAPILLARY
Glucose-Capillary: 129 mg/dL — ABNORMAL HIGH (ref 70–99)
Glucose-Capillary: 103 mg/dL — ABNORMAL HIGH (ref 70–99)
Glucose-Capillary: 89 mg/dL (ref 70–99)
Glucose-Capillary: 83 mg/dL (ref 70–99)
Glucose-Capillary: 113 mg/dL — ABNORMAL HIGH (ref 70–99)

## 2022-04-26 LAB — CULTURE, BLOOD (ROUTINE X 2)
Special Requests: ADEQUATE
Special Requests: ADEQUATE

## 2022-04-26 LAB — COMPREHENSIVE METABOLIC PANEL
ALT: 33 U/L (ref 0–44)
AST: 46 U/L — ABNORMAL HIGH (ref 15–41)
Albumin: 2 g/dL — ABNORMAL LOW (ref 3.5–5.0)
Alkaline Phosphatase: 63 U/L (ref 38–126)
Anion gap: 7 (ref 5–15)
BUN: 37 mg/dL — ABNORMAL HIGH (ref 8–23)
CO2: 22 mmol/L (ref 22–32)
Calcium: 7.9 mg/dL — ABNORMAL LOW (ref 8.9–10.3)
Chloride: 123 mmol/L — ABNORMAL HIGH (ref 98–111)
Creatinine, Ser: 2.23 mg/dL — ABNORMAL HIGH (ref 0.61–1.24)
GFR, Estimated: 33 mL/min — ABNORMAL LOW (ref >60)
Glucose, Bld: 94 mg/dL (ref 70–99)
Potassium: 3.3 mmol/L — ABNORMAL LOW (ref 3.5–5.1)
Sodium: 152 mmol/L — ABNORMAL HIGH (ref 135–145)
Total Bilirubin: 1 mg/dL (ref 0.3–1.2)
Total Protein: 5.3 g/dL — ABNORMAL LOW (ref 6.5–8.1)

## 2022-04-26 LAB — CBC
HCT: 25.5 % — ABNORMAL LOW (ref 39.0–52.0)
Hemoglobin: 8.6 g/dL — ABNORMAL LOW (ref 13.0–17.0)
MCH: 29.5 pg (ref 26.0–34.0)
MCHC: 33.7 g/dL (ref 30.0–36.0)
MCV: 87.3 fL (ref 80.0–100.0)
Platelets: 79 10*3/uL — ABNORMAL LOW (ref 150–400)
RBC: 2.92 MIL/uL — ABNORMAL LOW (ref 4.22–5.81)
RDW: 16.4 % — ABNORMAL HIGH (ref 11.5–15.5)
WBC: 7.9 10*3/uL (ref 4.0–10.5)
nRBC: 0 % (ref 0.0–0.2)

## 2022-04-26 LAB — ECHOCARDIOGRAM COMPLETE
Area-P 1/2: 5.42 cm2
MV M vel: 3.5 m/s
MV Peak grad: 49 mmHg
S' Lateral: 3.3 cm
Weight: 1954.16 oz

## 2022-04-26 LAB — MAGNESIUM: Magnesium: 2.7 mg/dL — ABNORMAL HIGH (ref 1.7–2.4)

## 2022-04-26 LAB — AMMONIA: Ammonia: 31 umol/L (ref 9–35)

## 2022-04-26 LAB — PHOSPHORUS: Phosphorus: 1.8 mg/dL — ABNORMAL LOW (ref 2.5–4.6)

## 2022-04-26 MED ORDER — CLOZAPINE 100 MG PO TABS
500.0000 mg | ORAL_TABLET | Freq: Every day | ORAL | Status: DC
  Administered 2022-04-26 – 2022-05-02 (×6): 500 mg via ORAL
  Filled 2022-04-26 (×10): qty 5

## 2022-04-26 MED ORDER — CLOPIDOGREL BISULFATE 75 MG PO TABS
75.0000 mg | ORAL_TABLET | Freq: Every day | ORAL | Status: DC
  Administered 2022-04-27: 75 mg via ORAL
  Filled 2022-04-26 (×2): qty 1

## 2022-04-26 MED ORDER — ASPIRIN 81 MG PO CHEW
81.0000 mg | CHEWABLE_TABLET | Freq: Every day | ORAL | Status: DC
  Administered 2022-04-26 – 2022-04-27 (×2): 81 mg via ORAL
  Filled 2022-04-26 (×2): qty 1

## 2022-04-26 MED ORDER — DEXTROSE-NACL 5-0.45 % IV SOLN: INTRAVENOUS | Status: DC

## 2022-04-26 MED ORDER — POTASSIUM PHOSPHATES 15 MMOLE/5ML IV SOLN
15.0000 mmol | Freq: Once | INTRAVENOUS | Status: AC
  Administered 2022-04-26: 15 mmol via INTRAVENOUS
  Filled 2022-04-26: qty 5

## 2022-04-26 NOTE — Progress Notes (Signed)
Subjective: No clinical seizure. Asking for chips. Was confused where TV is in room and thought the computer is tv  ROS: negative except above  Examination  Vital signs in last 24 hours: Temp:  [97.6 F (36.4 C)-99 F (37.2 C)] 98.3 F (36.8 C) (02/29 0800) Pulse Rate:  [93-117] 95 (02/29 0900) Resp:  [14-33] 20 (02/29 0900) BP: (90-136)/(56-91) 128/91 (02/29 0900) SpO2:  [96 %-100 %] 100 % (02/29 0900) Weight:  [55.4 kg] 55.4 kg (02/29 0500)  General: lying in bed, NAD Neuro: Alert, awake, able to tell me his name, kept talking about TV and asking for chips, PERLA, tracks examiner in room, no facial asymmetry, follows simple commands after repetition, spontaneously moving all extremities with anti-gravity strength, left AKA  Basic Metabolic Panel: Recent Labs  Lab 04/23/22 1327 04/24/22 0326 04/25/22 0618 04/26/22 0358  NA 137 139 149* 152*  K 3.5 3.7 3.1* 3.3*  CL 102 109 119* 123*  CO2 23 21* 20* 22  GLUCOSE 193* 173* 104* 94  BUN 56* 51* 44* 37*  CREATININE 3.20* 2.83* 2.21* 2.23*  CALCIUM 8.3* 7.6* 7.8* 7.9*  MG 2.1 2.0 2.5* 2.7*  PHOS  --  3.8 2.6 1.8*    CBC: Recent Labs  Lab 04/23/22 1327 04/24/22 0326 04/25/22 0618 04/26/22 0358  WBC 8.4 7.0 7.1 7.9  NEUTROABS 7.1  --   --   --   HGB 10.5* 9.0* 9.2* 8.6*  HCT 31.0* 26.3* 27.4* 25.5*  MCV 86.6 84.8 86.4 87.3  PLT 89* 58* 73* 79*    Coagulation Studies: Recent Labs    04/25/22 1620  LABPROT 15.9*  INR 1.3*    Imaging CTH wo contrast 04/23/2022:  No acute CT finding. Generalized volume loss without lobar predominance. No evidence of large vessel stroke or advanced small-vessel disease.  ASSESSMENT AND PLAN: 62 year old male who presented with generalized myoclonus which appears to be epileptic in nature. EEG showed generalized spikes. Patient denies prior diagnosis of epilepsy. Possibly had epilepsy but never symptomatic until now.  Epilepsy - No further seizures - Continue depakote '500mg'$   BID, can switch to PO  - will leave on eeg today, likely dc tomorrow if no seizures - continue seizure precautions - prn IV ativan or versed for clinical seizures - Management of rest of comorbidity per primary team  I have spent a total of  36 minutes with the patient reviewing hospital notes,  test results, labs and examining the patient as well as establishing an assessment and plan that was discussed personally with the patient.  > 50% of time was spent in direct patient care.    Zeb Comfort Epilepsy [facility] For questions after 5pm please refer to AMION to reach the Neurologist on call

## 2022-04-26 NOTE — Progress Notes (Signed)
eLink Physician-Brief Progress Note Patient Name: Oscar Elliott DOB: [birth year] MRN: [id number]   Date of Service  04/26/2022  HPI/Events of Note  Patient needs renewal of his soft wrist restraints, and addition of Posey restraints, he is at very high risk of falling out of bed.  eICU Interventions  Restraints ordered.        Frederik Pear 04/26/2022, 8:38 PM

## 2022-04-26 NOTE — Progress Notes (Signed)
[facility] Progress Note Patient Name: Oscar Elliott DOB: 09-Feb-1961 MRN: [id number]   Date of Service  04/26/2022  HPI/Events of Note  Phos 1.8, K+ 3.3  eICU Interventions  K+ Phos 15 mmol iv x  1        Naomi Castrogiovanni U Shilynn Hoch 04/26/2022, 5:29 AM

## 2022-04-26 NOTE — Progress Notes (Signed)
NAME:  Oscar Elliott, MRN:  [id number], DOB:  [birth year], LOS: 1 ADMISSION DATE:  04/25/2022 CONSULTATION DATE:  04/25/2022 REFERRING MD:  Mortimer Fries - PCCM [facility]) CHIEF COMPLAINT:  Seizures   History of Present Illness:  62 year old man who presented to [facility] 2/26 for tremor, HA. Presented with jerking tremor of R arm, R leg, HA. PMHx significant for HTN, HLD, CAD c/b STEMI (s/p DES to RCA 10/2013), PVD (s/p L AKA), T2DM, CKD, schizophrenia (baseline intermittent disorientation).  Patient presented to [facility] ED 2/26 with jerking of his R arm and R leg. On arrival, noted to be afebrile, tachycardic to 117, BP soft 95/69, SpO2 99%. Labs were notable for WBC 8.4, Hgb 10.5, Plt 89. Electrolytes WNL. BUN 56 (18), Cr 3.2 (1.13). AST 45 (27), ALT 30 (32), Tbili WNL. CK 491, LA 2.9, PCT 20.51. UA with mod Hgb, large leuks, mild protein. Blood and urine Cx positive for E. Coli. Broad spectrum antibiotics initiated (cefepime/vanc/Flagyl), narrowed to ceftriaxone 2/27. CXR was unremarkable. US Renal was obtained showing nonobstructing 38m L renal calculus, unchanged from prior. CT Head NAICA with generalized volume loss, no LVO/advanced small vessel disease. Patient was subsequently admitted to the hospitalist service.  Patient was noted to have E. coli bacteremia and E. coli UTI, initially was on vancomycin and Zosyn, then antibiotics were de-escalated to ceftriaxone, sensitivities are pending, patient remained altered despite appropriate treatment of sepsis, he was having jerking movements, neurology was consulted, patient was loaded with valproic acid, then was started on 500 mg twice daily, [facility]  Pertinent Medical History:   Past Medical History:  Diagnosis Date   CAD (coronary artery disease)    a. 10/2013 Lateral STEMI/PCI: LM nl, LAD min irregs, D1 occluded sub branch, LCX min irregs mid, OM large/patent, RCA 1045m2.75x28  Promus DES).   Cholelithiasis    Essential hypertension    History of renal failure    a. 06/2007: in setting of rhabdo post L AKA.   Hyperlipidemia    MI (myocardial infarction) ([place]   Nephrolithiasis    Peripheral vascular disease (HCR[place]   a. 06/2007: s/p L AKA @ [facility]   Schizophrenia ([facility]   Type 2 diabetes mellitus (HCC[place]   Significant Hospital Events: Including procedures, antibiotic start and stop dates in addition to other pertinent events   [facility] 2[facility] - Improved mental status. Remains on LTM EEG.  Interim History / Subjective:  No significant events overnight [facility] Improving mental status, remains disoriented but much more awake Continued LTM EEG with generalized epilepsy, no seizures Several electrolyte abnormalities, Na/K/Phos Kphos repleted, D51/2 started for Na  Objective:  Blood pressure 125/79, pulse 93, temperature 98 F (36.7 C), resp. rate (!) 23, weight 55.4 kg, SpO2 100 %.        Intake/Output Summary (Last 24 hours) at 04/26/2022 0715 Last data filed at 04/26/2022 0600 Gross per 24 hour  Intake 1258.34 ml  Output 250 ml  Net 1008.34 ml   Filed Weights   04/26/22 0500  Weight: 55.4 kg   Physical Examination: General: Chronically ill-appearing middle-aged man in NAD. Intermittently restless, agitated. HEENT: [place]/AT, anicteric sclera, PERRL, dry mucous membranes. Neuro: Awake, oriented x 1. Disoriented, but answering questions appropriately. Responds to verbal stimuli. Following commands consistently. Moves all 4 extremities spontaneously. CV: RRR, no m/g/r. PULM: Breathing even and unlabored on 2LNC.  Lung fields CTAB. GI: Soft, nontender, nondistended. Normoactive bowel sounds. Extremities: No LE edema noted. L AKA noted. Skin: Warm/dry, no rashes.  Resolved Hospital Problem List:    Assessment & Plan:  Seizures Acute encephalopathy, multifactorial secondary to postictal state versus  due to sepsis or metabolic in the setting of AKI - Neuro following, appreciate recommendations - LTM EEG with generalized epilepsy, no seizures recorded - Continue LTM EEG - Continue valproic acid '500mg'$  BID - Frequent neuro checks - Seizure precautions, aspiration precautions - SLP evaluation prior to giving PO meds  Sepsis secondary to E. Coli infection in the setting of +UCx/BCx Lactic acidosis, resolved - Goal MAP > 65 - Fluid resuscitation as tolerated, switch to D51/2NS for Na correction - Trend WBC, fever curve, LA normalized - F/u finalized Cx data - Continue IV ceftriaxone (E. Coli pansensitive)  CAD with STEMI s/p DES 2015 PVD s/p L AKA HTN HLD Last Echo 05/2016 with EF 50%, G1DD. - Repeat Echo - Continue DAPT, statin - Hold home antihypertensives for now - Cardiac monitoring - Hold home antihypertensives for now  AKI in the setting of urosepsis Hypernatremia - Trend BMP, especially Na/K - Fluids changed to D51/2NS for Na correction - Replete electrolytes as indicated - Monitor I&Os - Avoid nephrotoxic agents as able - Ensure adequate renal perfusion  T2DM Last hemoglobin A1c 6.7 - SSI - CBGs Q4H - Goal CBG 140-180  Schizophrenia, less likely serotonin syndrome - [facility] appreciate recs - Continue clonazepam, clozapine - Resume duloxetine/Abilify as clinically appropriate  Best Practice: (right click and "Reselect all SmartList Selections" daily)   Diet/type: NPO DVT prophylaxis: SCD, SQH GI prophylaxis: PPI Lines: N/A Foley:  N/A Code Status:  full code Last date of multidisciplinary goals of care discussion [Pending]  Critical care time:   The patient is critically ill with multiple organ system failure and requires high complexity decision making for assessment and support, frequent evaluation and titration of therapies, advanced monitoring, review of radiographic studies and interpretation of complex data.   Critical Care  Time devoted to patient care services, exclusive of separately billable procedures, described in this note is 38 minutes.  Lestine Mount, PA-C [facility] 04/26/22 7:17 AM  Please see [email or web address] for pager details.  From 7A-7P if no response, please call [phone] After hours, please call ELink [phone]

## 2022-04-26 NOTE — Evaluation (Signed)
Clinical/Bedside Swallow Evaluation Patient Details  Name: Oscar Elliott MRN: [id number] Date of Birth: 25-May-1960  Today's Date: 04/26/2022 Time: SLP Start Time (ACUTE ONLY): 1219 SLP Stop Time (ACUTE ONLY): 1228 SLP Time Calculation (min) (ACUTE ONLY): 9 min  Past Medical History:  Past Medical History:  Diagnosis Date   CAD (coronary artery disease)    a. 10/2013 Lateral STEMI/PCI: LM nl, LAD min irregs, D1 occluded sub branch, LCX min irregs mid, OM large/patent, RCA 172m(2.75x28 Promus DES).   Cholelithiasis    Essential hypertension    History of renal failure    a. 06/2007: in setting of rhabdo post L AKA.   Hyperlipidemia    MI (myocardial infarction) ([place]    Nephrolithiasis    Peripheral vascular disease ([place]    a. 06/2007: s/p L AKA @ [facility]   Schizophrenia ([place]    Type 2 diabetes mellitus ([place]    Past Surgical History:  Past Surgical History:  Procedure Laterality Date   Above-the-knee amputation Left    COLONOSCOPY WITH PROPOFOL N/A 12/09/2018   fields: nonthrombosed hemorrhoids, decreased anal sphincter tone. poor prep, colonoscopy aborted   FLEXIBLE SIGMOIDOSCOPY  11/17/2019   Procedure: FLEXIBLE SIGMOIDOSCOPY;  Surgeon: CEloise Harman DO;  Location: [facility] END[facility] SUITE;  Service: Endoscopy;;   LEFT HEART CATHETERIZATION WITH CORONARY ANGIOGRAM N/A 11/20/2013   Procedure: LEFT HEART CATHETERIZATION WITH CORONARY ANGIOGRAM;  Surgeon: JJettie Booze MD;  [facility];  Service: Cardiovascular;  Laterality: N/A;   HPI:  62[facility]. Presented with jerking tremor of R arm, R leg, HA. Treated for sepsis with E. coli bacteremia and UTI. Head CT 2/26 with no acute findings.  [facility]. PMHx significant for HTN, HLD, CAD c/b STEMI (s/p DES to RCA 10/2013), PVD (s/p L AKA), T2DM, CKD, schizophrenia (baseline intermittent  disorientation).    Assessment / Plan / Recommendation  Clinical Impression  Pt presents with grossly normal swallow funcation as assessed clinically.  Pt tolerated all consisencies trialed, including serial straw sips of thin liquid, with no clinical s/s of aspiration.  Pt exhibited good oral clearance of regular solids after prolonged oral phase.  He stated that the graham cracker was hard.  Pt was given soft solid cracker with applesauce and reports it was better.  Pt is edentulous.  He states he has dentures but they are not here; they may have been left at group home.  Pt appears safe to advance to a regular texture diet if desired even without dentures present, but recommend mechanical soft solid for ease of intake at this time.  Pt has no further ST needs.  SLP will sign off.   Recommend mechanical soft solids with thin liquids.  SLP Visit Diagnosis: Dysphagia, unspecified (R13.10)    Aspiration Risk  No limitations    Diet Recommendation Dysphagia 3 (Mech soft);Thin liquid (May advance to regular texture if desired)   Liquid Administration via: Cup;Straw Medication Administration: Whole meds with liquid Supervision: Staff to assist with self feeding    Other  Recommendations Oral Care Recommendations: Oral care BID    Recommendations for follow up therapy are one component of a multi-disciplinary discharge planning process, led by the attending physician.  Recommendations may be updated based on patient status, additional functional criteria and insurance authorization.  Follow up Recommendations No SLP follow up      Assistance Recommended at Discharge  N/A  Functional Status Assessment Patient has not had a recent decline in their functional status  Frequency and Duration  (N/A)          Prognosis Prognosis for improved oropharyngeal function:  (N/A)      Swallow Study   General Date of Onset: 04/23/22 HPI: 62 year old man who presented to [facility] 2/26 for tremor, HA.  Presented with jerking tremor of R arm, R leg, HA. Treated for sepsis with E. coli bacteremia and UTI. Head CT 2/26 with no acute findings.  Jerking continued and transferred to [facility] for continuous EEG with epileptiform discharges observed. PMHx significant for HTN, HLD, CAD c/b STEMI (s/p DES to RCA 10/2013), PVD (s/p L AKA), T2DM, CKD, schizophrenia (baseline intermittent disorientation). Type of Study: Bedside Swallow Evaluation Previous Swallow Assessment: None Diet Prior to this Study: NPO Temperature Spikes Noted: No Respiratory Status: Nasal cannula History of Recent Intubation: No Behavior/Cognition: Cooperative;Alert;Impulsive Oral Cavity Assessment: Within Functional Limits Oral Care Completed by SLP: No Oral Cavity - Dentition: Edentulous;Dentures, not available Self-Feeding Abilities: Needs assist Patient Positioning: Upright in bed Baseline Vocal Quality: Normal Volitional Cough: Cognitively unable to elicit Volitional Swallow: Unable to elicit    Oral/Motor/Sensory Function Overall Oral Motor/Sensory Function:  (limited assessment 2/2 ability to follow directions/impulsivity) Facial ROM: Within Functional Limits Facial Symmetry: Within Functional Limits Velum: Within Functional Limits Mandible: Within Functional Limits   Ice Chips Ice chips: Not tested   Thin Liquid Thin Liquid: Within functional limits Presentation: Straw    Nectar Thick Nectar Thick Liquid: Not tested   Honey Thick Honey Thick Liquid: Not tested   Puree Puree: Within functional limits Presentation: Spoon   Solid     Solid: Impaired Presentation: Self Fed Oral Phase Functional Implications: Prolonged oral transit      Celedonio Savage, MA, [place] Office: [phone] 04/26/2022,12:42 PM

## 2022-04-26 NOTE — Progress Notes (Signed)
  Echocardiogram 2D Echocardiogram has been performed. With help of rn pt has agreed.  Wynelle Link 04/26/2022, 2:55 PM

## 2022-04-26 NOTE — Progress Notes (Signed)
Pt refused exam.

## 2022-04-26 NOTE — Procedures (Addendum)
Patient Name: Oscar Elliott  MRN: [id number]  Epilepsy Attending: Lora Havens  Referring Physician/Provider: Derek Jack, MD  Duration: 04/25/2022 2148 to 04/26/2022 2148  Patient history:  62 year old male who presented with generalized myoclonus. EEG to evaluate for seizure   Level of alertness: Awake  AEDs during EEG study: VPA  Technical aspects: This EEG study was done with scalp electrodes positioned according to the 10-20 International system of electrode placement. Electrical activity was reviewed with band pass filter of 1-'70Hz'$ , sensitivity of 7 uV/mm, display speed of 72m/sec with a '60Hz'$  notched filter applied as appropriate. EEG data were recorded continuously and digitally stored.  Video monitoring was available and reviewed as appropriate.  Description: The posterior dominant rhythm consists of 8 Hz activity of moderate voltage (25-35 uV) seen predominantly in posterior head regions, symmetric and reactive to eye opening and eye closing. Generalized Spikes were noted frequently. Hyperventilation and photic stimulation were not performed.     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 No seizures were seen throughout the recording.  Leelynn Whetsel [name]

## 2022-04-26 NOTE — Progress Notes (Incomplete)
Introduction: 30 YOM from [facility] admitted for seizure  History of present illness: Presented to [facility] on 2/26 with jerking tremor   Past medical history/allergies/family history pertinent to current illness: HTN, HLD, CAD/STEMI s/p DES to RCA, PVD s/p AKA, T2DM, CKD, schizophrenia  PTA meds: clonazepam 0.5 QHS, clozapine 100 mg QAM & 500 QPM, insulin detemir 33 units daily, linzess 145 mcg daily, valproate 500 mg BID, aspirin, plavix, gabe 100 BID, omeprazole 40 BID, tamsulosin 0.4 daily  Social hx:  Physical exam/relevant labs, diagnostic testing/imaging:  Last 24 hours:  AMS didn't improve with treatment of sepsis , still jerking, treated for seizures   Baseline Scr 1.1 ish, currently 2.23 on LR 100 mL/hr  Problem based assessment and plan:  - Problem 1: Seizures/echephalopathy  A. Assessment: Low suspicion for seizures in ED as he had symptoms across midline with no LOC. Pt is also schizophrenic with baseline intermittent disorientation. CT head was negative and CXR was negative. Mental status has been waxing and waning throughout admission. EEG done 2/28 showed evidence of epileptogenicity with generalized onset and ecephalopathy, Two seizures seen on EEG with IV ativan adminsitered which imrpoved symptoms. Started on clozapine but not currenlty getting due to lack of tube.   B. Plan:  - Problem 2: Sepsis/E coli bacteremia + UTI  A. Assessment: WBC normal but HR and RR eelevated so code sepsis was called. CXR negative but Bcx and Ucx positive for E coli. Started on NS, Pt was started empirically on Vanc and cefepime, also received one dose of metronidazole and one dose of zosyn, which has been de-escalated to CRO 2 g Q24H. He is currenlty on day 4 of abx. He has been afebrile and WBC have been normal. Cx came back pan-sensitive so CRO is appropriate, gram - bacteremia can treat for 7-14 days. There was a 2019 study comparing 7 days of therapy to 14 days for gram negative bacteremia and 7  days was found to be noninferior. His fluid has been switched to dextrose and half NS as his sodium was high at 152.  B. Plan: Continue CRO 2 g Q24 for total duration of 7 days. Continue d5 and half NS, monitor WBC,temp   - Problem 3: CAD  A. Assessment:  B. Plan:   -Problem 4: AKI, Scr 3.2 at peak now down to 2.23 baseline around 1, has been on fluids, likely pre renal due to dehydration, current med doses appropraite based on renal fxn  Supportive care: Phos 1.8, K 3.3, given 15 mmol K phos, wouldve done more but may be enough d/t kidney function,  F- NPO no feeds, not intubated A- none S- none T- heparin [place] H- U- none G- 89-140, no feeds, SSI B- LBM PTA, miralax and docusate PRN  I- catheter D- E coli bacteremia and E colu UTI, vanc and zosyn de-escalated to CRO, 100 k E coli, susc pending, tmax 99 WBC 7.9, day 3 of abx treat 10-14 days

## 2022-04-27 ENCOUNTER — Inpatient Hospital Stay (HOSPITAL_COMMUNITY): Payer: Medicare (Managed Care)

## 2022-04-27 DIAGNOSIS — R569 Unspecified convulsions: Secondary | ICD-10-CM | POA: Diagnosis not present

## 2022-04-27 LAB — CBC
HCT: 24 % — ABNORMAL LOW (ref 39.0–52.0)
Hemoglobin: 8 g/dL — ABNORMAL LOW (ref 13.0–17.0)
MCH: 29 pg (ref 26.0–34.0)
MCHC: 33.3 g/dL (ref 30.0–36.0)
MCV: 87 fL (ref 80.0–100.0)
Platelets: 72 10*3/uL — ABNORMAL LOW (ref 150–400)
RBC: 2.76 MIL/uL — ABNORMAL LOW (ref 4.22–5.81)
RDW: 16.7 % — ABNORMAL HIGH (ref 11.5–15.5)
WBC: 6.3 10*3/uL (ref 4.0–10.5)
nRBC: 0.5 % — ABNORMAL HIGH (ref 0.0–0.2)

## 2022-04-27 LAB — GLUCOSE, CAPILLARY
Glucose-Capillary: 138 mg/dL — ABNORMAL HIGH (ref 70–99)
Glucose-Capillary: 114 mg/dL — ABNORMAL HIGH (ref 70–99)
Glucose-Capillary: 124 mg/dL — ABNORMAL HIGH (ref 70–99)
Glucose-Capillary: 149 mg/dL — ABNORMAL HIGH (ref 70–99)

## 2022-04-27 LAB — BASIC METABOLIC PANEL
Anion gap: 10 (ref 5–15)
BUN: 22 mg/dL (ref 8–23)
CO2: 19 mmol/L — ABNORMAL LOW (ref 22–32)
Calcium: 7.4 mg/dL — ABNORMAL LOW (ref 8.9–10.3)
Chloride: 119 mmol/L — ABNORMAL HIGH (ref 98–111)
Creatinine, Ser: 1.87 mg/dL — ABNORMAL HIGH (ref 0.61–1.24)
GFR, Estimated: 40 mL/min — ABNORMAL LOW (ref >60)
Glucose, Bld: 159 mg/dL — ABNORMAL HIGH (ref 70–99)
Potassium: 3.1 mmol/L — ABNORMAL LOW (ref 3.5–5.1)
Sodium: 148 mmol/L — ABNORMAL HIGH (ref 135–145)

## 2022-04-27 LAB — MAGNESIUM: Magnesium: 2.3 mg/dL (ref 1.7–2.4)

## 2022-04-27 LAB — PHOSPHORUS: Phosphorus: 2.1 mg/dL — ABNORMAL LOW (ref 2.5–4.6)

## 2022-04-27 MED ORDER — STERILE WATER FOR INJECTION IJ SOLN
INTRAMUSCULAR | Status: AC
  Filled 2022-04-27: qty 10

## 2022-04-27 MED ORDER — LORAZEPAM 2 MG/ML IJ SOLN
INTRAMUSCULAR | Status: AC
  Filled 2022-04-27: qty 1

## 2022-04-27 MED ORDER — HALOPERIDOL LACTATE 5 MG/ML IJ SOLN
5.0000 mg | Freq: Once | INTRAMUSCULAR | Status: AC
  Administered 2022-04-27: 5 mg via INTRAVENOUS
  Filled 2022-04-27: qty 1

## 2022-04-27 MED ORDER — HALOPERIDOL LACTATE 5 MG/ML IJ SOLN
2.0000 mg | Freq: Four times a day (QID) | INTRAMUSCULAR | Status: DC | PRN
  Administered 2022-04-27: 5 mg via INTRAVENOUS
  Filled 2022-04-27: qty 1

## 2022-04-27 MED ORDER — THIAMINE MONONITRATE 100 MG PO TABS
100.0000 mg | ORAL_TABLET | Freq: Every day | ORAL | Status: DC
  Filled 2022-04-27: qty 1

## 2022-04-27 MED ORDER — MIDAZOLAM HCL 2 MG/2ML IJ SOLN
2.0000 mg | Freq: Once | INTRAMUSCULAR | Status: AC
  Administered 2022-04-27: 2 mg via INTRAVENOUS
  Filled 2022-04-27: qty 2

## 2022-04-27 MED ORDER — DIVALPROEX SODIUM 500 MG PO DR TAB
500.0000 mg | DELAYED_RELEASE_TABLET | Freq: Two times a day (BID) | ORAL | Status: DC
  Administered 2022-04-27 – 2022-04-28 (×2): 500 mg via ORAL
  Filled 2022-04-27 (×5): qty 1

## 2022-04-27 MED ORDER — SODIUM CHLORIDE 0.9 % IV SOLN: INTRAVENOUS | Status: DC | PRN

## 2022-04-27 MED ORDER — LORAZEPAM 2 MG/ML IJ SOLN
1.0000 mg | INTRAMUSCULAR | Status: DC | PRN
  Administered 2022-04-27 (×2): 1 mg via INTRAVENOUS
  Filled 2022-04-27: qty 1

## 2022-04-27 NOTE — Progress Notes (Signed)
NAME:  Oscar Elliott, MRN:  [id number], DOB:  1960/06/04, LOS: 2 ADMISSION DATE:  04/25/2022 CONSULTATION DATE:  04/25/2022 REFERRING MD:  Mortimer Fries - PCCM [facility]) CHIEF COMPLAINT:  Seizures   History of Present Illness:  62 year old man who presented to [facility] 2/26 for tremor, HA. Presented with jerking tremor of R arm, R leg, HA. PMHx significant for HTN, HLD, CAD c/b STEMI (s/p DES to RCA 10/2013), PVD (s/p L AKA), T2DM, CKD, schizophrenia (baseline intermittent disorientation).  Patient presented to [facility] ED 2/26 with jerking of his R arm and R leg. On arrival, noted to be afebrile, tachycardic to 117, BP soft 95/69, SpO2 99%. Labs were notable for WBC 8.4, Hgb 10.5, Plt 89. Electrolytes WNL. BUN 56 (18), Cr 3.2 (1.13). AST 45 (27), ALT 30 (32), Tbili WNL. CK 491, LA 2.9, PCT 20.51. UA with mod Hgb, large leuks, mild protein. Blood and urine Cx positive for E. Coli. Broad spectrum antibiotics initiated (cefepime/vanc/Flagyl), narrowed to ceftriaxone 2/27. CXR was unremarkable. US Renal was obtained showing nonobstructing 49m L renal calculus, unchanged from prior. CT Head NAICA with generalized volume loss, no LVO/advanced small vessel disease. Patient was subsequently admitted to the hospitalist service.  Patient was noted to have E. coli bacteremia and E. coli UTI, initially was on vancomycin and Zosyn, then antibiotics were de-escalated to ceftriaxone, sensitivities are pending, patient remained altered despite appropriate treatment of sepsis, he was having jerking movements, neurology was consulted, patient was loaded with valproic acid, then was started on 500 mg twice daily, [facility]  Pertinent Medical History:   Past Medical History:  Diagnosis Date   CAD (coronary artery disease)    a. 10/2013 Lateral STEMI/PCI: LM nl, LAD min irregs, D1 occluded sub branch, LCX min irregs mid, OM large/patent, RCA [id number]  Promus DES).   Cholelithiasis    Essential hypertension    History of renal failure    a. 06/2007: in setting of rhabdo post L AKA.   Hyperlipidemia    MI (myocardial infarction) ([facility]   Nephrolithiasis    Peripheral vascular disease ([facility]   a. 06/2007: s/p L AKA @ [facility]   Schizophrenia ([facility]   Type 2 diabetes mellitus ([place]   Significant Hospital Events: Including procedures, antibiotic start and stop dates in addition to other pertinent events   [facility] - Improved mental status. Remains on LTM EEG. 3/01 - LTM EEG discontinued. Intermittent agitation/impulsivity and hallucinations. Refusing meds/labs/care, required Haldol with good effect.  Interim History / Subjective:  No significant events overnight Intermittent agitation, persistent hallucinations/paranoia Intermittently refusing meds/labs/care Haldol '5mg'$  IV x 1 given with good effect Psych consult placed for assistance LTM EEG discontinued, no further seizures Likely stable for transfer out of ICU  Objective:  Blood pressure 129/89, pulse 94, temperature 98.4 F (36.9 C), temperature source Axillary, resp. rate (!) 21, weight 55.4 kg, SpO2 100 %.        Intake/Output Summary (Last 24 hours) at 04/27/2022 0729 Last data filed at 04/27/2022 0700 Gross per 24 hour  Intake 3017.52 ml  Output 1600 ml  Net 1417.52 ml    Filed Weights   04/26/22 0500  Weight: 55.4 kg   Physical Examination: General: Chronically ill-appearing middle-aged man in NAD. Intermittent agitation, talking to himself/examiner. HEENT: [place]/AT, anicteric sclera, PERRL, dry mucous membranes. Neuro: Awake, oriented x 1. Disoriented and having some  visual hallucinations. Responds to verbal stimuli. Following commands consistently. Moves all 4 extremities spontaneously.  CV: RRR, no m/g/r. PULM: Breathing even and unlabored on 2LNC. Lung fields CTAB. GI: Soft, nontender, nondistended. Normoactive bowel  sounds. Extremities: No LE edema noted. L AKA present. Skin: Warm/dry, no rashes.  Resolved Hospital Problem List:    Assessment & Plan:  Seizures Acute encephalopathy, multifactorial secondary to postictal state versus due to sepsis or metabolic in the setting of AKI - Neuro following, appreciate recommendations - LTM EEG discontinued, no further seizures - MRI Brain when feasible, per Neuro - Continue valproic acid '500mg'$  BID - Frequent neuro checks - Seizure precautions, aspiration precautions  Sepsis secondary to E. Coli infection in the setting of +UCx/BCx Lactic acidosis, resolved - Goal MAP > 65 - Fluid resuscitation as tolerated, switch to D51/2NS for Na correction - Trend WBC, fever curve, LA normalized - F/u finalized Cx data - Continue IV ceftriaxone (E. Coli pansensitive)  CAD with STEMI s/p DES 2015 PVD s/p L AKA HTN HLD Last Echo 05/2016 with EF 50%, G1DD. Repeat 2/29 with EF 60-65%, small pericardial effusion. - Continue DAPT, statin - Resume home antihypertensives as appropriate - Cardiac monitoring  AKI in the setting of urosepsis Hypernatremia Hypokalemia - Trend BMP, patient refused labs 3/1AM, improving on 1100 draw - Replete electrolytes as indicated - Monitor I&Os - Avoid nephrotoxic agents as able - Ensure adequate renal perfusion  T2DM Last hemoglobin A1c 6.7 - SSI - CBGs Q4H - Goal CBG 140-180  Schizophrenia, less likely serotonin syndrome - Psychiatry consulted, f/u recommendations - Continue clonazepam, clozapine, Haldol PRN - Resume duloxetine/Abilify as appropriate  Overall clinically improving without further evidence of seizures. Stable for transfer out of ICU; will sign patient out to [facility] for 3/2.  Best Practice: (right click and "Reselect all SmartList Selections" daily)   Diet/type: NPO DVT prophylaxis: SCD, SQH GI prophylaxis: PPI Lines: N/A Foley:  N/A Code Status:  full code Last date of multidisciplinary goals of care  discussion [Pending]  Signature:   Rhae Lerner [facility] 04/27/22 7:29 AM  Please see [email or web address] for pager details.  From 7A-7P if no response, please call [phone] After hours, please call ELink [phone]

## 2022-04-27 NOTE — Progress Notes (Signed)
Pt refusing to have CBG drawn at this time. Will trying again later.

## 2022-04-27 NOTE — Progress Notes (Signed)
Subjective: No further seizures.   ROS: negative except above  Examination  Vital signs in last 24 hours: Temp:  [98.4 F (36.9 C)-98.8 F (37.1 C)] 98.4 F (36.9 C) (03/01 0400) Pulse Rate:  [86-106] 95 (03/01 0800) Resp:  [16-26] 20 (03/01 0800) BP: (99-161)/(58-107) 104/70 (03/01 0800) SpO2:  [89 %-100 %] 99 % (03/01 0800)  General: lying in bed, NAD Neuro: Alert, awake, able to tell me his name, didn't answer other orientation questions, PERLA, tracks examiner in room, no facial asymmetry, follows simple commands after repetition, spontaneously moving all extremities with anti-gravity strength, left AKA  Basic Metabolic Panel: Recent Labs  Lab 04/23/22 1327 04/24/22 0326 04/25/22 0618 04/26/22 0358  NA 137 139 149* 152*  K 3.5 3.7 3.1* 3.3*  CL 102 109 119* 123*  CO2 23 21* 20* 22  GLUCOSE 193* 173* 104* 94  BUN 56* 51* 44* 37*  CREATININE 3.20* 2.83* 2.21* 2.23*  CALCIUM 8.3* 7.6* 7.8* 7.9*  MG 2.1 2.0 2.5* 2.7*  PHOS  --  3.8 2.6 1.8*    CBC: Recent Labs  Lab 04/23/22 1327 04/24/22 0326 04/25/22 0618 04/26/22 0358  WBC 8.4 7.0 7.1 7.9  NEUTROABS 7.1  --   --   --   HGB 10.5* 9.0* 9.2* 8.6*  HCT 31.0* 26.3* 27.4* 25.5*  MCV 86.6 84.8 86.4 87.3  PLT 89* 58* 73* 79*     Coagulation Studies: Recent Labs    04/25/22 1620  LABPROT 15.9*  INR 1.3*    Imaging No new brain imaging  ASSESSMENT AND PLAN; 63 year old male who presented with generalized myoclonus which appears to be epileptic in nature. EEG showed generalized spikes. Patient denies prior diagnosis of epilepsy. Possibly had epilepsy but never symptomatic until now.   Epilepsy - No further seizures - Continue depakote '500mg'$  BID, can switch to PO  - DC LTM eeg - Will try to get MRI brain wo contrast due to new onset epilepsy. Patient is confused so will give IV versed but patient may not cooperate - continue seizure precautions - prn IV ativan or versed for clinical seizures -  Management of rest of comorbidity per primary team - No further inpatient neurology workup  Seizure precautions: Per [facility] statutes, patients with seizures are not allowed to drive until they have been seizure-free for six months and cleared by a physician    Use caution when using heavy equipment or power tools. Avoid working on ladders or at heights. Take showers instead of baths. Ensure the water temperature is not too high on the home water heater. Do not go swimming alone. Do not lock yourself in a room alone (i.e. bathroom). When caring for infants or small children, sit down when holding, feeding, or changing them to minimize risk of injury to the child in the event you have a seizure. Maintain good sleep hygiene. Avoid alcohol.    If patient has another seizure, call 911 and bring them back to the ED if: A.  The seizure lasts longer than 5 minutes.      B.  The patient doesn't wake shortly after the seizure or has new problems such as difficulty seeing, speaking or moving following the seizure C.  The patient was injured during the seizure D.  The patient has a temperature over 102 F (39C) E.  The patient vomited during the seizure and now is having trouble breathing    During the Seizure   - First, ensure adequate ventilation and  place patients on the floor on their left side  Loosen clothing around the neck and ensure the airway is patent. If the patient is clenching the teeth, do not force the mouth open with any object as this can cause severe damage - Remove all items from the surrounding that can be hazardous. The patient may be oblivious to what's happening and may not even know what he or she is doing. If the patient is confused and wandering, either gently guide him/her away and block access to outside areas - Reassure the individual and be comforting - Call 911. In most cases, the seizure ends before EMS arrives. However, there are cases when seizures may last  over 3 to 5 minutes. Or the individual may have developed breathing difficulties or severe injuries. If a pregnant patient or a person with diabetes develops a seizure, it is prudent to call an ambulance. - Finally, if the patient does not regain full consciousness, then call EMS. Most patients will remain confused for about 45 to 90 minutes after a seizure, so you must use judgment in calling for help.   After the Seizure (Postictal Stage)   After a seizure, most patients experience confusion, fatigue, muscle pain and/or a headache. Thus, one should permit the individual to sleep. For the next few days, reassurance is essential. Being calm and helping reorient the person is also of importance.   Most seizures are painless and end spontaneously. Seizures are not harmful to others but can lead to complications such as stress on the lungs, brain and the heart. Individuals with prior lung problems may develop labored breathing and respiratory distress.    I have spent a total of  36 minutes with the patient reviewing hospital notes,  test results, labs and examining the patient as well as establishing an assessment and plan that was discussed personally with the patient.  > 50% of time was spent in direct patient care.  Zeb Comfort Epilepsy [facility] For questions after 5pm please refer to AMION to reach the Neurologist on call

## 2022-04-27 NOTE — Progress Notes (Signed)
LTM EEG discontinued - no skin breakdown at unhook.   

## 2022-04-27 NOTE — Progress Notes (Signed)
LTM maint complete - no skin breakdown under: FP2,PZ

## 2022-04-27 NOTE — Progress Notes (Signed)
Family at [facility] with MAR from group home detailing patient meds. Copy made and put in Chart. Notified pharmacy and was directed to pharm tech in charge of medication history and reconciliation at [phone] with no answer. CCM team made aware of MAR on chart

## 2022-04-27 NOTE — Procedures (Signed)
Patient Name: Oscar Elliott  MRN: [id number]  Epilepsy Attending: Lora Havens  Referring Physician/Provider: Derek Jack, MD  Duration: 04/26/2022 2148 to 04/27/2022 1130   Patient history:  62 year old male who presented with generalized myoclonus. EEG to evaluate for seizure    Level of alertness: Awake   AEDs during EEG study: VPA   Technical aspects: This EEG study was done with scalp electrodes positioned according to the 10-20 International system of electrode placement. Electrical activity was reviewed with band pass filter of 1-'70Hz'$ , sensitivity of 7 uV/mm, display speed of 87m/sec with a '60Hz'$  notched filter applied as appropriate. EEG data were recorded continuously and digitally stored.  Video monitoring was available and reviewed as appropriate.   Description: The posterior dominant rhythm consists of 8 Hz activity of moderate voltage (25-35 uV) seen predominantly in posterior head regions, symmetric and reactive to eye opening and eye closing. Generalized Spikes were noted frequently. Hyperventilation and photic stimulation were not performed.      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 No seizures were seen throughout the recording.   [name] [name]

## 2022-04-28 ENCOUNTER — Inpatient Hospital Stay (HOSPITAL_COMMUNITY): Payer: Medicare (Managed Care)

## 2022-04-28 ENCOUNTER — Encounter (HOSPITAL_COMMUNITY): Payer: Self-pay | Admitting: Internal Medicine

## 2022-04-28 LAB — CBC
HCT: 25.9 % — ABNORMAL LOW (ref 39.0–52.0)
Hemoglobin: 8.8 g/dL — ABNORMAL LOW (ref 13.0–17.0)
MCH: 29.6 pg (ref 26.0–34.0)
MCHC: 34 g/dL (ref 30.0–36.0)
MCV: 87.2 fL (ref 80.0–100.0)
Platelets: 74 10*3/uL — ABNORMAL LOW (ref 150–400)
RBC: 2.97 MIL/uL — ABNORMAL LOW (ref 4.22–5.81)
RDW: 17 % — ABNORMAL HIGH (ref 11.5–15.5)
WBC: 8.6 10*3/uL (ref 4.0–10.5)
nRBC: 0.2 % (ref 0.0–0.2)

## 2022-04-28 LAB — BASIC METABOLIC PANEL
Anion gap: 6 (ref 5–15)
BUN: 18 mg/dL (ref 8–23)
CO2: 21 mmol/L — ABNORMAL LOW (ref 22–32)
Calcium: 7.6 mg/dL — ABNORMAL LOW (ref 8.9–10.3)
Chloride: 118 mmol/L — ABNORMAL HIGH (ref 98–111)
Creatinine, Ser: 1.68 mg/dL — ABNORMAL HIGH (ref 0.61–1.24)
GFR, Estimated: 46 mL/min — ABNORMAL LOW (ref >60)
Glucose, Bld: 199 mg/dL — ABNORMAL HIGH (ref 70–99)
Potassium: 3.5 mmol/L (ref 3.5–5.1)
Sodium: 145 mmol/L (ref 135–145)

## 2022-04-28 LAB — MAGNESIUM: Magnesium: 2.1 mg/dL (ref 1.7–2.4)

## 2022-04-28 LAB — PHOSPHORUS: Phosphorus: 3.2 mg/dL (ref 2.5–4.6)

## 2022-04-28 MED ORDER — VALPROATE SODIUM 100 MG/ML IV SOLN
500.0000 mg | Freq: Once | INTRAVENOUS | Status: AC
  Administered 2022-04-28: 500 mg via INTRAVENOUS
  Filled 2022-04-28: qty 5

## 2022-04-28 MED ORDER — THIAMINE HCL 100 MG/ML IJ SOLN
100.0000 mg | Freq: Every day | INTRAMUSCULAR | Status: DC
  Administered 2022-04-28 – 2022-04-29 (×2): 100 mg via INTRAVENOUS
  Filled 2022-04-28 (×2): qty 2

## 2022-04-28 MED ORDER — THIAMINE MONONITRATE 100 MG PO TABS
100.0000 mg | ORAL_TABLET | Freq: Every day | ORAL | Status: DC
  Administered 2022-05-01 – 2022-05-03 (×3): 100 mg via ORAL
  Filled 2022-04-28 (×3): qty 1

## 2022-04-28 NOTE — Progress Notes (Signed)
MRI with small R sided SDH, about 22m. Hold Aspirin and plavix. Will get repeat CTH in AM. Platelets 89, repeat platelet count this AM.  Discussed with Dr. JJeneen Rinkswith EWarren Lacyin AM.  S[place]anvillePager Number [phone]

## 2022-04-28 NOTE — Progress Notes (Signed)
  Progress Note   Patient: Oscar Elliott [id number] DOB: [birth year] DOA: 04/25/2022     3 DOS: the patient was seen and examined on 04/28/2022   Brief hospital course:  Assessment and Plan: Seizures Acute encephalopathy, multifactorial secondary to postictal state versus due to sepsis or metabolic in the setting of AKI - Seizure precautions, aspiration precautions - Depakote 500 mg PO q12  - IV valproate 500 mg x 1  - Haldol PRN for agitation  - IV ativan 1 mg q4 hr PRN  - IV thiamine 100 mg daily   Sepsis secondary to E. Coli infection in the setting of +UCx/BCx - IV ceftriaxone 2 g daily  - IV D5-1/2 NS @ 100 cc/hr    CAD with STEMI s/p DES 2015 PVD s/p L AKA HTN HLD - Cardiac monitoring - Neurology stopped ASA and plavix due to SDH on MRI    AKI in the setting of urosepsis Hypernatremia Hypokalemia - Na+ wnl - Mag wnl  - K wnl  - Monitor Cr    T2DM - A1c 6.7 - Novolog SS q4 hr    Schizophrenia - Clozaril 100 mg PO daily and 500 mg PO qhs  - Haldol PRN for agitation  - IV ativan 1 mg q4 hr PRN   DVT prophylaxis: SCDs ordered      Subjective: Pt seen and examined at the bedside. Appreciate neurology and psychiatry following along. Continue with IV ceftriaxone.  Physical Exam: Vitals:   04/28/22 0600 04/28/22 0700 04/28/22 0800 04/28/22 0900  BP: 129/84 130/79 (!) 143/88 (!) 124/90  Pulse: (!) 109 (!) 118 (!) 111 (!) 117  Resp: '17 18 19 17  '$ Temp:      TempSrc:      SpO2: 100% 98% 97% 100%  Weight:       Physical Exam Constitutional:      Comments: Unable to ascertain at this time   HENT:     Head: Normocephalic.     Mouth/Throat:     Mouth: Mucous membranes are moist.  Cardiovascular:     Rate and Rhythm: Tachycardia present.  Pulmonary:     Breath sounds: Rhonchi present.     Comments: Coarse breath sounds b/l Abdominal:     Palpations: Abdomen is soft.  Musculoskeletal:     Cervical back: Neck supple.  Skin:    General: Skin is warm.   Neurological:     Comments: Drowsy   Psychiatric:     Comments: Unable to ascertain at this time     Data Reviewed:   Disposition: Status is: Inpatient  Planned Discharge Destination:  Back to group home     Time spent: 35 minutes  Author: Lucienne Minks , MD 04/28/2022 12:50 PM  For on call review [email or web address].

## 2022-04-28 NOTE — Consult Note (Signed)
[facility] Face-to-Face Psychiatry Consult   Reason for Consult:  History of Schizophrenia Referring Physician:  Dr. Toula Moos Patient Identification: Oscar Elliott MRN:  [id number] Principal Diagnosis: <principal problem not specified> Diagnosis:  Active Problems:   Schizophrenia ([place])   Total Time spent with patient: 30 minutes  HPI:   Per Primary Team: 62 year old man who presented to [facility] 2/26 for tremor, HA. Presented with jerking tremor of R arm, R leg, HA. PMHx significant for HTN, HLD, CAD c/b STEMI (s/p DES to RCA 10/2013), PVD (s/p L AKA), T2DM, CKD, schizophrenia (baseline intermittent disorientation).   Patient presented to [facility] ED 2/26 with jerking of his R arm and R leg. On arrival, noted to be afebrile, tachycardic to 117, BP soft 95/69, SpO2 99%. Labs were notable for WBC 8.4, Hgb 10.5, Plt 89. Electrolytes WNL. BUN 56 (18), Cr 3.2 (1.13). AST 45 (27), ALT 30 (32), Tbili WNL. CK 491, LA 2.9, PCT 20.51. UA with mod Hgb, large leuks, mild protein. Blood and urine Cx positive for E. Coli. Broad spectrum antibiotics initiated (cefepime/vanc/Flagyl), narrowed to ceftriaxone 2/27. CXR was unremarkable. US Renal was obtained showing nonobstructing 34m L renal calculus, unchanged from prior. CT Head NAICA with generalized volume loss, no LVO/advanced small vessel disease. Patient was subsequently admitted to the hospitalist service.   Patient was noted to have E. coli bacteremia and E. coli UTI, initially was on vancomycin and Zosyn, then antibiotics were de-escalated to ceftriaxone, sensitivities are pending, patient remained altered despite appropriate treatment of sepsis, he was having jerking movements, neurology was consulted, patient was loaded with valproic acid, then was started on 500 mg twice daily, [facility]  Subjective:   Oscar Settleis a 62y.o. male patient admitted with seizures. Psychiatry was  consulted to assess the patient due to his history of schizophrenia and concerns for agitation in the ICU.   Patient seen laying in bed in restraints on my approach this morning. The patient was unable to be aroused and history was obtained from nurse at bedside.   Per nursing staff the patient has been in an out of restraints for the past 24 hours or more. He hasn't been able to provide any intelligible conversation at this time.  Collateral LRobet [phone]'s caregiver He reports that he brought the patient to the hospital due to concerns for seizure activity. The patient has a history of Schizophrenia and he follows up with a Dr. PJohnn Haimonthly. He reports that the patient has been on his current medication regimen for roughly 25 years with the only change being Trazodone recently to help him sleep. Typically the patient is active and able to communicate effectively. He has not felt that the patient's recent behavior has warranted a need to change his dose of Clozaril or Depakote.  Past Psychiatric History: See above   Risk to Self:   UTA Risk to Others:   UTA Prior Inpatient Therapy:   Yes Prior Outpatient Therapy:   Yes  Past Medical History:  Past Medical History:  Diagnosis Date   CAD (coronary artery disease)    a. 10/2013 Lateral STEMI/PCI: LM nl, LAD min irregs, D1 occluded sub branch, LCX min irregs mid, OM large/patent, RCA [id number] Promus DES).   Cholelithiasis    Essential hypertension    History of renal failure    a. 06/2007: in setting of rhabdo post L AKA.   Hyperlipidemia    MI (  myocardial infarction) ([place])    Nephrolithiasis    Peripheral vascular disease ([place])    a. 06/2007: s/p L AKA @ [facility]   Schizophrenia ([place])    Type 2 diabetes mellitus ([place])     Past Surgical History:  Procedure Laterality Date   Above-the-knee amputation Left    COLONOSCOPY WITH PROPOFOL N/A 12/09/2018   fields: nonthrombosed hemorrhoids, decreased anal sphincter  tone. poor prep, colonoscopy aborted   FLEXIBLE SIGMOIDOSCOPY  11/17/2019   Procedure: FLEXIBLE SIGMOIDOSCOPY;  Surgeon: Eloise Harman, DO;  Location: [facility] END[facility] SUITE;  Service: Endoscopy;;   LEFT HEART CATHETERIZATION WITH CORONARY ANGIOGRAM N/A 11/20/2013   Procedure: LEFT HEART CATHETERIZATION WITH CORONARY ANGIOGRAM;  Surgeon: Jettie Booze, MD;  Location: [facility] CATH LAB;  Service: Cardiovascular;  Laterality: N/A;   Family Psychiatric  History: Brother currently incarcerated Social History:  Social History   Substance and Sexual Activity  Alcohol Use No   Alcohol/week: 0.0 standard drinks of alcohol     Social History   Substance and Sexual Activity  Drug Use No    Social History   Socioeconomic History   Marital status: Single    Spouse name: Not on file   Number of children: Not on file   Years of education: Not on file   Highest education level: Not on file  Occupational History   Not on file  Tobacco Use   Smoking status: Every Day    Packs/day: 0.25    Years: 20.00    Total pack years: 5.00    Types: Cigars, Cigarettes   Smokeless tobacco: Never   Tobacco comments:    1-2 cig daily  Vaping Use   Vaping Use: Never used  Substance and Sexual Activity   Alcohol use: No    Alcohol/week: 0.0 standard drinks of alcohol   Drug use: No   Sexual activity: Never  Other Topics Concern   Not on file  Social History Narrative   Single, no children      Currently living in a group home      Outpatient psychiatrist month Dr. Johnn Hai    Social Determinants of Health   Financial Resource Strain: Not on file  Food Insecurity: Not on file  Transportation Needs: Not on file  Physical Activity: Not on file  Stress: Not on file  Social Connections: Not on file   Additional Social History:    Allergies:   Allergies  Allergen Reactions   Penicillins Other (See Comments)    DIZZINESS     Labs:  Results for orders placed or performed during the hospital  encounter of 04/25/22 (from the past 48 hour(s))  Glucose, capillary     Status: Abnormal   Collection Time: 04/26/22 11:34 AM  Result Value Ref Range   Glucose-Capillary 113 (H) 70 - 99 mg/dL    Comment: Glucose reference range applies only to samples taken after fasting for at least 8 hours.  Glucose, capillary     Status: Abnormal   Collection Time: 04/26/22  3:51 PM  Result Value Ref Range   Glucose-Capillary 138 (H) 70 - 99 mg/dL    Comment: Glucose reference range applies only to samples taken after fasting for at least 8 hours.  Glucose, capillary     Status: Abnormal   Collection Time: 04/26/22  7:27 PM  Result Value Ref Range   Glucose-Capillary 114 (H) 70 - 99 mg/dL    Comment: Glucose reference range applies only to samples taken after fasting for at  least 8 hours.  Glucose, capillary     Status: Abnormal   Collection Time: 04/27/22  9:26 AM  Result Value Ref Range   Glucose-Capillary 124 (H) 70 - 99 mg/dL    Comment: Glucose reference range applies only to samples taken after fasting for at least 8 hours.  CBC     Status: Abnormal   Collection Time: 04/27/22 11:16 AM  Result Value Ref Range   WBC 6.3 4.0 - 10.5 K/uL   RBC 2.76 (L) 4.22 - 5.81 MIL/uL   Hemoglobin 8.0 (L) 13.0 - 17.0 g/dL   HCT 24.0 (L) 39.0 - 52.0 %   MCV 87.0 80.0 - 100.0 fL   MCH 29.0 26.0 - 34.0 pg   MCHC 33.3 30.0 - 36.0 g/dL   RDW 16.7 (H) 11.5 - 15.5 %   Platelets 72 (L) 150 - 400 K/uL    Comment: Immature Platelet Fraction may be clinically indicated, consider ordering this additional test [id number] CONSISTENT WITH PREVIOUS RESULT REPEATED TO VERIFY    nRBC 0.5 (H) 0.0 - 0.2 %    Comment: Performed at [facility], [address], [place], [place] [address]  Magnesium     Status: None   Collection Time: 04/27/22 11:16 AM  Result Value Ref Range   Magnesium 2.3 1.7 - 2.4 mg/dL    Comment: Performed at [facility] [address] [place], [place] [address]  Phosphorus      Status: Abnormal   Collection Time: 04/27/22 11:16 AM  Result Value Ref Range   Phosphorus 2.1 (L) 2.5 - 4.6 mg/dL    Comment: Performed at [facility] [address] [place], [place] [id number]  Basic metabolic panel     Status: Abnormal   Collection Time: 04/27/22 11:16 AM  Result Value Ref Range   Sodium 148 (H) 135 - 145 mmol/L   Potassium 3.1 (L) 3.5 - 5.1 mmol/L   Chloride 119 (H) 98 - 111 mmol/L   CO2 19 (L) 22 - 32 mmol/L   Glucose, Bld 159 (H) 70 - 99 mg/dL    Comment: Glucose reference range applies only to samples taken after fasting for at least 8 hours.   BUN 22 8 - 23 mg/dL   Creatinine, Ser 1.87 (H) 0.61 - 1.24 mg/dL   Calcium 7.4 (L) 8.9 - 10.3 mg/dL   GFR, Estimated 40 (L) >60 mL/min    Comment: (NOTE) Calculated using the CKD-EPI Creatinine Equation (2021)    Anion gap 10 5 - 15    Comment: Performed at [facility] [address], [place], [place] [address]  Glucose, capillary     Status: Abnormal   Collection Time: 04/27/22  1:32 PM  Result Value Ref Range   Glucose-Capillary 149 (H) 70 - 99 mg/dL    Comment: Glucose reference range applies only to samples taken after fasting for at least 8 hours.  CBC     Status: Abnormal   Collection Time: 04/28/22  6:18 AM  Result Value Ref Range   WBC 8.6 4.0 - 10.5 K/uL   RBC 2.97 (L) 4.22 - 5.81 MIL/uL   Hemoglobin 8.8 (L) 13.0 - 17.0 g/dL   HCT 25.9 (L) 39.0 - 52.0 %   MCV 87.2 80.0 - 100.0 fL   MCH 29.6 26.0 - 34.0 pg   MCHC 34.0 30.0 - 36.0 g/dL   RDW 17.0 (H) 11.5 - 15.5 %   Platelets 74 (L) 150 - 400  K/uL    Comment: Immature Platelet Fraction may be clinically indicated, consider ordering this additional test [id number] REPEATED TO VERIFY PLATELET COUNT CONFIRMED BY SMEAR    nRBC 0.2 0.0 - 0.2 %    Comment: Performed at [facility], [address], [place], [place] [id number]  Basic metabolic panel     Status: Abnormal   Collection Time: 04/28/22  6:18 AM  Result Value Ref Range    Sodium 145 135 - 145 mmol/L   Potassium 3.5 3.5 - 5.1 mmol/L   Chloride 118 (H) 98 - 111 mmol/L   CO2 21 (L) 22 - 32 mmol/L   Glucose, Bld 199 (H) 70 - 99 mg/dL    Comment: Glucose reference range applies only to samples taken after fasting for at least 8 hours.   BUN 18 8 - 23 mg/dL   Creatinine, Ser 1.68 (H) 0.61 - 1.24 mg/dL   Calcium 7.6 (L) 8.9 - 10.3 mg/dL   GFR, Estimated 46 (L) >60 mL/min    Comment: (NOTE) Calculated using the CKD-EPI Creatinine Equation (2021)    Anion gap 6 5 - 15    Comment: Performed at [facility] [address] [place], [place] [address]  Phosphorus     Status: None   Collection Time: 04/28/22  6:18 AM  Result Value Ref Range   Phosphorus 3.2 2.5 - 4.6 mg/dL    Comment: Performed at [facility] [address] [place], [place] [address]  Magnesium     Status: None   Collection Time: 04/28/22  6:18 AM  Result Value Ref Range   Magnesium 2.1 1.7 - 2.4 mg/dL    Comment: Performed at [facility] [address], [place],  [address]    Current Facility-Administered Medications  Medication Dose Route Frequency Provider Last Rate Last Admin   0.9 %  sodium chloride infusion   Intravenous PRN Freddi Starr, MD 10 mL/hr at 04/28/22 0700 Infusion Verify at 04/28/22 0700   cefTRIAXone (ROCEPHIN) 2 g in sodium chloride 0.9 % 100 mL IVPB  2 g Intravenous Q24H [place] Crane M, PA-C   Stopped at 04/27/22 1434   Chlorhexidine Gluconate Cloth 2 % PADS 6 each  6 each Topical Daily Jacky Kindle, MD   6 each at 04/27/22 2007   cloZAPine (CLOZARIL) tablet 100 mg  100 mg Oral Daily Jacky Kindle, MD   100 mg at 04/27/22 1225   cloZAPine (CLOZARIL) tablet 500 mg  500 mg Oral QHS Nevada Crane M, PA-C   500 mg at 04/27/22 2127   dextrose 5 %-0.45 % sodium chloride infusion   Intravenous Continuous Lestine Mount, PA-C 100 mL/hr at 04/28/22 0700 Infusion Verify at 04/28/22 0700   divalproex (DEPAKOTE) DR tablet 500 mg  500 mg  Oral Q12H Ventura Sellers, RPH   500 mg at 04/27/22 2127   docusate sodium (COLACE) capsule 100 mg  100 mg Oral BID PRN Jacky Kindle, MD       haloperidol lactate (HALDOL) injection 2-5 mg  2-5 mg Intravenous Q6H PRN [place] Crane M, PA-C   5 mg at 04/27/22 2126   heparin injection 5,000 Units  5,000 Units Subcutaneous Q8H Jacky Kindle, MD   5,000 Units at 04/28/22 0523   insulin aspart (novoLOG) injection 0-9 Units  0-9 Units Subcutaneous Q4H Jacky Kindle, MD   2 Units at 04/28/22 0859   LORazepam (ATIVAN) injection 1 mg  1 mg Intravenous Q4H PRN  [place] Crane M, PA-C   1 mg at 04/27/22 2200   ondansetron (ZOFRAN) injection 4 mg  4 mg Intravenous Q6H PRN Jacky Kindle, MD   4 mg at 04/26/22 2323   Oral care mouth rinse  15 mL Mouth Rinse PRN Ogan, Kerry Kass, MD       polyethylene glycol (MIRALAX / GLYCOLAX) packet 17 g  17 g Oral Daily PRN Jacky Kindle, MD       thiamine (VITAMIN B1) tablet 100 mg  100 mg Oral Daily Dimple Nanas, RPH       Or   thiamine (VITAMIN B1) injection 100 mg  100 mg Intravenous Daily Dimple Nanas, RPH   100 mg at 04/28/22 1049   valproate (DEPACON) 500 mg in dextrose 5 % 50 mL IVPB  500 mg Intravenous Once Dimple Nanas, [facility]        Musculoskeletal: Strength & Muscle Tone: UTA Gait & Station: UTA Patient leans: UTA            Psychiatric Specialty Exam:  Presentation  General Appearance:  Disheveled  Eye Contact: Other (comment) (UTA)  Speech: Other (comment) (UTA)  Speech Volume: Other (comment) (UTA)  Handedness:No data recorded  Mood and Affect  Mood: -- (UTA)  Affect: -- (UTA)   Thought Process  Thought Processes: -- (UTA)  Descriptions of Associations:-- (UTA)  Orientation:-- (UTA)  Thought Content:-- (UTA)  History of Schizophrenia/Schizoaffective disorder:No data recorded Duration of Psychotic Symptoms:No data recorded Hallucinations:Hallucinations: -- (UTA)  Ideas of Reference:--  (UTA)  Suicidal Thoughts:Suicidal Thoughts: -- (UTA)  Homicidal Thoughts:Homicidal Thoughts: -- (UTA)   Sensorium  Memory: -- (UTA)  Judgment: Poor  Insight: Poor   Executive Functions  Concentration: -- (UTA)  Attention Span: -- (UTA)  Recall: -- Pincus Badder)  Fund of Knowledge: -- (UTA)  Language: -- (UTA)   Psychomotor Activity  Psychomotor Activity: Psychomotor Activity: -- (UTA)   Assets  Assets: -- (UTA)   Sleep  Sleep:No data recorded  Physical Exam: Physical Exam ROS Blood pressure (!) 124/90, pulse (!) 117, temperature 97.9 F (36.6 C), temperature source Axillary, resp. rate 17, weight 57.3 kg, SpO2 100 %. Body mass index is 23.87 kg/m.  Treatment Plan Summary: -Agree with Clozaril 100 mg PO Qam and 500 mg PO QHS  -Agree with Depakote 500 mg PO Q12 -Agree with PRN Ativan and Haldol for agitation -Consider restarting Trazodone 100 mg PO QHS if patient is having difficulty sleeping through the night.   Disposition:  Patient does not meet criteria for psychiatric inpatient admission. Please reconsult psychiatry if necessary  Pecolia Ades, DO 04/28/2022 10:51 AM

## 2022-04-29 ENCOUNTER — Inpatient Hospital Stay (HOSPITAL_COMMUNITY): Payer: Medicare (Managed Care)

## 2022-04-29 DIAGNOSIS — G40909 Epilepsy, unspecified, not intractable, without status epilepticus: Secondary | ICD-10-CM

## 2022-04-29 DIAGNOSIS — S065XAA Traumatic subdural hemorrhage with loss of consciousness status unknown, initial encounter: Secondary | ICD-10-CM

## 2022-04-29 DIAGNOSIS — F209 Schizophrenia, unspecified: Secondary | ICD-10-CM

## 2022-04-29 LAB — CBC
HCT: 25.4 % — ABNORMAL LOW (ref 39.0–52.0)
Hemoglobin: 8.5 g/dL — ABNORMAL LOW (ref 13.0–17.0)
MCH: 29.1 pg (ref 26.0–34.0)
MCHC: 33.5 g/dL (ref 30.0–36.0)
MCV: 87 fL (ref 80.0–100.0)
Platelets: 87 10*3/uL — ABNORMAL LOW (ref 150–400)
RBC: 2.92 MIL/uL — ABNORMAL LOW (ref 4.22–5.81)
RDW: 17.1 % — ABNORMAL HIGH (ref 11.5–15.5)
WBC: 10 10*3/uL (ref 4.0–10.5)
nRBC: 0.4 % — ABNORMAL HIGH (ref 0.0–0.2)

## 2022-04-29 LAB — MAGNESIUM: Magnesium: 2 mg/dL (ref 1.7–2.4)

## 2022-04-29 LAB — COMPREHENSIVE METABOLIC PANEL
ALT: 23 U/L (ref 0–44)
AST: 33 U/L (ref 15–41)
Albumin: 1.6 g/dL — ABNORMAL LOW (ref 3.5–5.0)
Alkaline Phosphatase: 61 U/L (ref 38–126)
Anion gap: 5 (ref 5–15)
BUN: 14 mg/dL (ref 8–23)
CO2: 21 mmol/L — ABNORMAL LOW (ref 22–32)
Calcium: 7.4 mg/dL — ABNORMAL LOW (ref 8.9–10.3)
Chloride: 120 mmol/L — ABNORMAL HIGH (ref 98–111)
Creatinine, Ser: 1.47 mg/dL — ABNORMAL HIGH (ref 0.61–1.24)
GFR, Estimated: 54 mL/min — ABNORMAL LOW (ref >60)
Glucose, Bld: 138 mg/dL — ABNORMAL HIGH (ref 70–99)
Potassium: 3.6 mmol/L (ref 3.5–5.1)
Sodium: 146 mmol/L — ABNORMAL HIGH (ref 135–145)
Total Bilirubin: 0.6 mg/dL (ref 0.3–1.2)
Total Protein: 4.8 g/dL — ABNORMAL LOW (ref 6.5–8.1)

## 2022-04-29 MED ORDER — SODIUM CHLORIDE 0.9 % IV SOLN: 200.0000 mg | Freq: Once | INTRAVENOUS | Status: DC

## 2022-04-29 MED ORDER — TOPIRAMATE 25 MG PO TABS
12.5000 mg | ORAL_TABLET | Freq: Two times a day (BID) | ORAL | Status: DC
  Administered 2022-04-30 – 2022-05-03 (×7): 12.5 mg via ORAL
  Filled 2022-04-29 (×10): qty 0.5

## 2022-04-29 MED ORDER — ORAL CARE MOUTH RINSE: 15.0000 mL | OROMUCOSAL | Status: DC | PRN

## 2022-04-29 MED ORDER — SODIUM CHLORIDE 0.9 % IV SOLN: 50.0000 mg | Freq: Once | INTRAVENOUS | Status: DC

## 2022-04-29 MED ORDER — LEVETIRACETAM IN NACL 1500 MG/100ML IV SOLN
1500.0000 mg | Freq: Once | INTRAVENOUS | Status: AC
  Administered 2022-04-29: 1500 mg via INTRAVENOUS
  Filled 2022-04-29: qty 100

## 2022-04-29 MED ORDER — ORAL CARE MOUTH RINSE
15.0000 mL | OROMUCOSAL | Status: DC
  Administered 2022-04-29 – 2022-04-30 (×4): 15 mL via OROMUCOSAL

## 2022-04-29 MED ORDER — LEVETIRACETAM IN NACL 500 MG/100ML IV SOLN: 500.0000 mg | Freq: Two times a day (BID) | INTRAVENOUS | Status: DC

## 2022-04-29 NOTE — Progress Notes (Signed)
SLP Cancellation Note  Patient Details Name: Oscar Elliott MRN: [id number] DOB: Jul 22, 1960   Cancelled treatment:       Reason Eval/Treat Not Completed: Fatigue/lethargy limiting ability to participate (Pt was approached by SLP for swallow evaluation, but pt was unable to maintain an adequate level of alertness for long enough periods to participate. SLP will follow up on subsequent date.)  Sarabella Caprio I. Hardin Negus, [place], [place] Office number [phone]  Horton Marshall 04/29/2022, 3:57 PM

## 2022-04-29 NOTE — Progress Notes (Addendum)
Subjective: Neurology called back for further consultation after CT head showed spontaneous SDH. Patient remains oriented to self only  ROS: negative except above  Examination  Vital signs in last 24 hours: Temp:  [98 F (36.7 C)-98.6 F (37 C)] 98.5 F (36.9 C) (03/03 1600) Pulse Rate:  [90-121] 96 (03/03 1700) Resp:  [1-25] 15 (03/03 1700) BP: (120-154)/(77-103) 146/88 (03/03 1700) SpO2:  [98 %-100 %] 100 % (03/03 1700) FiO2 (%):  [28 %] 28 % (03/03 0840) Weight:  [56.4 kg] 56.4 kg (03/03 0326)  General: lying in bed, NAD Neuro: Alert, awake, able to tell me his name, didn't answer other orientation questions, PERLA, tracks examiner in room, no facial asymmetry, follows simple commands after repetition, spontaneously moving all extremities with anti-gravity strength, left AKA  Basic Metabolic Panel: Recent Labs  Lab 04/24/22 0326 04/25/22 0618 04/26/22 0358 04/27/22 1116 04/28/22 0618 04/29/22 1230  NA 139 149* 152* 148* 145 146*  K 3.7 3.1* 3.3* 3.1* 3.5 3.6  CL 109 119* 123* 119* 118* 120*  CO2 21* 20* 22 19* 21* 21*  GLUCOSE 173* 104* 94 159* 199* 138*  BUN 51* 44* 37* '22 18 14  '$ CREATININE 2.83* 2.21* 2.23* 1.87* 1.68* 1.47*  CALCIUM 7.6* 7.8* 7.9* 7.4* 7.6* 7.4*  MG 2.0 2.5* 2.7* 2.3 2.1 2.0  PHOS 3.8 2.6 1.8* 2.1* 3.2  --      CBC: Recent Labs  Lab 04/23/22 1327 04/24/22 0326 04/25/22 0618 04/26/22 0358 04/27/22 1116 04/28/22 0618 04/29/22 1230  WBC 8.4   < > 7.1 7.9 6.3 8.6 10.0  NEUTROABS 7.1  --   --   --   --   --   --   HGB 10.5*   < > 9.2* 8.6* 8.0* 8.8* 8.5*  HCT 31.0*   < > 27.4* 25.5* 24.0* 25.9* 25.4*  MCV 86.6   < > 86.4 87.3 87.0 87.2 87.0  PLT 89*   < > 73* 79* 72* 74* 87*   < > = values in this interval not displayed.      Coagulation Studies: No results for input(s): "LABPROT", "INR" in the last 72 hours.   Imaging  CT head 3/3 Stable appearance of the subdural hemorrhage along the tentorium, posterior falx and right of the  posterior parietal/occipital lobe. No evidence of hydrocephalus. No significant interval change.  CT head 3/2 Thin subdural hemorrhage along the tentorium, posterior falx, and right posterior cerebral convexity. No progression since MRI yesterday. No new abnormality.  CT head 2/26 No acute CT finding. Generalized volume loss without lobar predominance. No evidence of large vessel stroke or advanced small-vessel disease.  MRI brain 3/2 1. Motion degraded exam. 2. Small subdural hematoma overlying the posterior right cerebral convexity, measuring up to 6 mm in maximal thickness. No significant mass effect. 3. 6 mm focus of restricted diffusion involving the peripheral right temporal cortex. Finding is favored to be related to the underlying subdural hemorrhage, although a small cortical infarct with petechial blood products is difficult to exclude. 4. 1.1 cm T1/FLAIR hyperintense lesion involving the pituitary gland, indeterminate. Primary differential considerations include a proteinaceous and/or hemorrhagic Rathke's cleft cyst or adenoma. Possible apoplexy could be considered in the correct clinical setting. Correlation with laboratory values recommended. Further evaluation dedicated pituitary protocol MRI could be performed for further evaluation as warranted. 5. Small remote left basal ganglia lacunar infarct.  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 EEG showed  generalized spikes. Patient denies prior diagnosis of epilepsy. Possibly had epilepsy but never symptomatic until now.  Neurology was reconsulted for spontaneous subdural seen on imaging yesterday which is stable on repeat head CT today.  Aspirin, Plavix, DVT prophylaxis have been discontinued.  Depakote is not ideal in the setting of intracranial hemorrhage therefore I will switch him to topiramate for myoclonic seizures. Keppra is not  appropriate given his hx schizophrenia.  Zonisamide is contraindicated given his renal function. Will start him on renally dosed topiramate. Given his new subdural, continued poor mental status, and the necessity of switching to an alternate AED I would like to observe him on EEG overnight to ensure that he is not having subclinical nonconvulsive seizures.  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 He would benefit from a cross-taper given the duration of time it will take to get therapeutic on lamotrigine. Given long duration of titration and recent cessation of depakote (significant drug-drug interaction) lamotrigine is not appropriate to start at this time.   Epilepsy - D/c VPA - Start topiramate 12.'5mg'$  bid for myoclonic seizures (renal dosing) - Overnight EEG - continue seizure precautions - prn IV ativan or versed for clinical seizures - Management of rest of comorbidity per primary team  This patient is critically ill and at significant risk of neurological worsening, death and care requires constant monitoring of vital signs, hemodynamics,respiratory and cardiac monitoring, neurological assessment, discussion with family, other specialists and medical decision making of high complexity. I spent 65 minutes of neurocritical care time  in the care of  this patient. This was time spent independent of any time provided by nurse practitioner or PA.  Su Monks, MD [facility] [phone]  If 7pm- 7am, please page neurology on call as listed in [place].

## 2022-04-29 NOTE — Progress Notes (Signed)
LTM EEG hooked up and running - no initial skin breakdown - push button tested - [facility] monitoring.  

## 2022-04-29 NOTE — Progress Notes (Signed)
  Progress Note   Patient: Oscar Elliott [id number] DOB: Jun 30, 1960 DOA: 04/25/2022     4 DOS: the patient was seen and examined on 04/29/2022   Brief hospital course:  Assessment and Plan: Seizures Acute encephalopathy, multifactorial secondary to postictal state versus due to sepsis or metabolic in the setting of AKI - Seizure precautions, aspiration precautions - IV Keppra 500 mg q12  - Haldol PRN for agitation  - IV ativan 1 mg q4 hr PRN  - IV thiamine 100 mg daily    Sepsis secondary to E. Coli infection in the setting of +UCx/BCx - IV ceftriaxone 2 g daily  - IV D5-1/2 NS @ 100 cc/hr    CAD with STEMI s/p DES 2015 PVD s/p L AKA HTN HLD - Cardiac monitoring - Neurology stopped ASA and plavix due to SDH on MRI    AKI in the setting of urosepsis Hypernatremia Hypokalemia - Na+ wnl - Mag wnl  - K wnl  - Monitor Cr    T2DM - A1c 6.7 - Novolog SS q4 hr    Schizophrenia - Clozaril 100 mg PO daily and 500 mg PO qhs  - Haldol PRN for agitation  - IV ativan 1 mg q4 hr PRN   SDH - Appreciate Dr. Quinn Axe from neurology following on 04/29/2022 and ordering repeat CT head imaging on 04/29/2022 (SDH is stable). Dr Quinn Axe also re-arranged the pt's AED's and the pt is now on IV keppra as noted above  - Dr. Quinn Axe will also be placing the pt on continuous EEG to eval for possible non-convulsive seizures    DVT prophylaxis: SCDs ordered     Subjective: Pt seen and examined at the bedside. Appreciate Dr. Quinn Axe from neurology assisting with the pt today (repeat CT Imaging showed a stable sub dural and also re-arranging the pt's seizure medications). Pt now on keppra. He is more alert today vs yesterday. CXR showed bibasilar opacities. Continue IV antibx.  Physical Exam: Vitals:   04/29/22 0900 04/29/22 1000 04/29/22 1100 04/29/22 1200  BP: 128/80 (!) 141/82 (!) 125/103 139/84  Pulse: (!) 106 (!) 110 (!) 106 97  Resp: 18 18 (!) 24 19  Temp:    98.3 F (36.8 C)  TempSrc:     Axillary  SpO2: 100% 100% 98% 100%  Weight:       Constitutional:      Comments: More awake  HENT:     Head: Normocephalic.     Mouth/Throat:     Mouth: Mucous membranes are moist.  Cardiovascular:     Rate and Rhythm: Tachycardia present.  Pulmonary:     Breath sounds: Rhonchi present.     Comments: Coarse breath sounds b/l Abdominal:     Palpations: Abdomen is soft.  Musculoskeletal:     Cervical back: Neck supple.  Skin:    General: Skin is warm.  Neurological:     Comments: Drowsy but awake  Psychiatric:     Comments: Mood neutral   Data Reviewed:   Disposition: Status is: Inpatient  Planned Discharge Destination: Barriers to discharge: IV antibx tx     Time spent: 35 minutes  Author: Lucienne Minks , MD 04/29/2022 2:56 PM  For on call review [email or web address].

## 2022-04-30 DIAGNOSIS — I10 Essential (primary) hypertension: Secondary | ICD-10-CM

## 2022-04-30 DIAGNOSIS — I2510 Atherosclerotic heart disease of native coronary artery without angina pectoris: Secondary | ICD-10-CM

## 2022-04-30 DIAGNOSIS — R5381 Other malaise: Secondary | ICD-10-CM

## 2022-04-30 DIAGNOSIS — E119 Type 2 diabetes mellitus without complications: Secondary | ICD-10-CM

## 2022-04-30 DIAGNOSIS — I739 Peripheral vascular disease, unspecified: Secondary | ICD-10-CM

## 2022-04-30 DIAGNOSIS — G9341 Metabolic encephalopathy: Secondary | ICD-10-CM

## 2022-04-30 DIAGNOSIS — Z9861 Coronary angioplasty status: Secondary | ICD-10-CM

## 2022-04-30 DIAGNOSIS — D649 Anemia, unspecified: Secondary | ICD-10-CM | POA: Insufficient documentation

## 2022-04-30 DIAGNOSIS — R1312 Dysphagia, oropharyngeal phase: Secondary | ICD-10-CM

## 2022-04-30 DIAGNOSIS — Z794 Long term (current) use of insulin: Secondary | ICD-10-CM

## 2022-04-30 DIAGNOSIS — D696 Thrombocytopenia, unspecified: Secondary | ICD-10-CM

## 2022-04-30 LAB — GLUCOSE, CAPILLARY
Glucose-Capillary: 105 mg/dL — ABNORMAL HIGH (ref 70–99)
Glucose-Capillary: 107 mg/dL — ABNORMAL HIGH (ref 70–99)
Glucose-Capillary: 115 mg/dL — ABNORMAL HIGH (ref 70–99)
Glucose-Capillary: 116 mg/dL — ABNORMAL HIGH (ref 70–99)
Glucose-Capillary: 128 mg/dL — ABNORMAL HIGH (ref 70–99)
Glucose-Capillary: 129 mg/dL — ABNORMAL HIGH (ref 70–99)
Glucose-Capillary: 133 mg/dL — ABNORMAL HIGH (ref 70–99)
Glucose-Capillary: 133 mg/dL — ABNORMAL HIGH (ref 70–99)
Glucose-Capillary: 135 mg/dL — ABNORMAL HIGH (ref 70–99)
Glucose-Capillary: 136 mg/dL — ABNORMAL HIGH (ref 70–99)
Glucose-Capillary: 138 mg/dL — ABNORMAL HIGH (ref 70–99)
Glucose-Capillary: 149 mg/dL — ABNORMAL HIGH (ref 70–99)
Glucose-Capillary: 149 mg/dL — ABNORMAL HIGH (ref 70–99)
Glucose-Capillary: 157 mg/dL — ABNORMAL HIGH (ref 70–99)
Glucose-Capillary: 176 mg/dL — ABNORMAL HIGH (ref 70–99)
Glucose-Capillary: 182 mg/dL — ABNORMAL HIGH (ref 70–99)
Glucose-Capillary: 194 mg/dL — ABNORMAL HIGH (ref 70–99)
Glucose-Capillary: 139 mg/dL — ABNORMAL HIGH (ref 70–99)
Glucose-Capillary: 163 mg/dL — ABNORMAL HIGH (ref 70–99)
Glucose-Capillary: 140 mg/dL — ABNORMAL HIGH (ref 70–99)
Glucose-Capillary: 153 mg/dL — ABNORMAL HIGH (ref 70–99)

## 2022-04-30 LAB — COMPREHENSIVE METABOLIC PANEL
ALT: 21 U/L (ref 0–44)
AST: 25 U/L (ref 15–41)
Albumin: 1.7 g/dL — ABNORMAL LOW (ref 3.5–5.0)
Alkaline Phosphatase: 66 U/L (ref 38–126)
Anion gap: 3 — ABNORMAL LOW (ref 5–15)
BUN: 11 mg/dL (ref 8–23)
CO2: 24 mmol/L (ref 22–32)
Calcium: 7.3 mg/dL — ABNORMAL LOW (ref 8.9–10.3)
Chloride: 118 mmol/L — ABNORMAL HIGH (ref 98–111)
Creatinine, Ser: 1.44 mg/dL — ABNORMAL HIGH (ref 0.61–1.24)
GFR, Estimated: 55 mL/min — ABNORMAL LOW (ref >60)
Glucose, Bld: 146 mg/dL — ABNORMAL HIGH (ref 70–99)
Potassium: 3.2 mmol/L — ABNORMAL LOW (ref 3.5–5.1)
Sodium: 145 mmol/L (ref 135–145)
Total Bilirubin: 0.6 mg/dL (ref 0.3–1.2)
Total Protein: 5.1 g/dL — ABNORMAL LOW (ref 6.5–8.1)

## 2022-04-30 LAB — CULTURE, BLOOD (ROUTINE X 2)
Culture: NO GROWTH
Culture: NO GROWTH
Special Requests: ADEQUATE
Special Requests: ADEQUATE

## 2022-04-30 LAB — MAGNESIUM: Magnesium: 1.9 mg/dL (ref 1.7–2.4)

## 2022-04-30 LAB — CBC
HCT: 23.5 % — ABNORMAL LOW (ref 39.0–52.0)
Hemoglobin: 8 g/dL — ABNORMAL LOW (ref 13.0–17.0)
MCH: 29.3 pg (ref 26.0–34.0)
MCHC: 34 g/dL (ref 30.0–36.0)
MCV: 86.1 fL (ref 80.0–100.0)
Platelets: 105 10*3/uL — ABNORMAL LOW (ref 150–400)
RBC: 2.73 MIL/uL — ABNORMAL LOW (ref 4.22–5.81)
RDW: 17.3 % — ABNORMAL HIGH (ref 11.5–15.5)
WBC: 9.9 10*3/uL (ref 4.0–10.5)
nRBC: 0 % (ref 0.0–0.2)

## 2022-04-30 LAB — CK: Total CK: 71 U/L (ref 49–397)

## 2022-04-30 LAB — C-REACTIVE PROTEIN: CRP: 9.9 mg/dL — ABNORMAL HIGH (ref <1.0)

## 2022-04-30 MED ORDER — POTASSIUM CHLORIDE 20 MEQ PO PACK
60.0000 meq | PACK | Freq: Once | ORAL | Status: AC
  Administered 2022-04-30: 60 meq via ORAL
  Filled 2022-04-30: qty 3

## 2022-04-30 MED ORDER — POTASSIUM CHLORIDE 10 MEQ/100ML IV SOLN
10.0000 meq | INTRAVENOUS | Status: AC
  Administered 2022-04-30 (×4): 10 meq via INTRAVENOUS
  Filled 2022-04-30 (×4): qty 100

## 2022-04-30 MED ORDER — KETOROLAC TROMETHAMINE 15 MG/ML IJ SOLN: 15.0000 mg | Freq: Once | INTRAMUSCULAR | Status: DC

## 2022-04-30 NOTE — Evaluation (Signed)
Occupational Therapy Evaluation Patient Details Name: Oscar Elliott MRN: [id number] DOB: [birth year] Today's Date: 04/30/2022   History of Present Illness The pt is a 62 yo male presenting to [place] 2/26 with headache and tremors, is alert but not oriented. Work up showed UTI, EEG on 2/28 showed 2 seizures, and transferred to [facility] for continuous EEG. 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  SDH stable on CT head on 3/2 and 3/3. Repeat LTM EEG on 3/4 within normal limits. PMHx of HTN, HLD, CAD c/b STEMI (s/p DES to RCA 10/2013), PVD (s/p L AKA), T2DM, CKD, schizophrenia (Simultaneous filing. User may not have seen previous data.)   Clinical Impression   Prior to this admission, patient living at group home with six other men and needing assist for ADLs. Patient reports that he typically uses a walker or a w/c at baseline. Currently, patient presenting with cognitive impairments (potentially baseline), myoclonic jerking in BUEs, dizziness in sitting EOB (VSS) and need for increased assist to complete all ADLs and mobility. Patient min-mod A for bed mobility, and max A for ADL management. OT recommending SNF at discharge, unless group home can provide current level of care needed. OT will continue to follow.      Recommendations for follow up therapy are one component of a multi-disciplinary discharge planning process, led by the attending physician.  Recommendations may be updated based on patient status, additional functional criteria and insurance authorization.   Follow Up Recommendations  Skilled nursing-short term rehab (<3 hours/day)     Assistance Recommended at Discharge Frequent or constant Supervision/Assistance  Patient can return home with the following Two people to help with walking and/or transfers;A lot of help with bathing/dressing/bathroom;Assistance with feeding;Direct supervision/assist for  medications management;Direct supervision/assist for financial management;Assist for transportation;Help with stairs or ramp for entrance    Functional Status Assessment  Patient has had a recent decline in their functional status and demonstrates the ability to make significant improvements in function in a reasonable and predictable amount of time.  Equipment Recommendations  Other (comment) (Defer to next venue)    Recommendations for Other Services       Precautions / Restrictions Precautions Precautions: Fall (Simultaneous filing. User may not have seen previous data.) Precaution Comments: L AKA, no prosthetic in room (Simultaneous filing. User may not have seen previous data.) Restrictions Weight Bearing Restrictions: No (Simultaneous filing. User may not have seen previous data.)      Mobility Bed Mobility Overal bed mobility: Needs Assistance Bed Mobility: Rolling, Supine to Sit, Sit to Supine Rolling: Min guard   Supine to sit: Mod assist Sit to supine: Min assist   General bed mobility comments: Mod A to come into sitting (motor planning) but good use of BUEs to scoot to EOB and return to supine, patient requires external assist in order to maintain sitting balance due to myoclonic jerking    Transfers                   General transfer comment: deferred due to no prosthetic present and dizziness in sitting      Balance Overall balance assessment: Mild deficits observed, not formally tested                                         ADL either  performed or assessed with clinical judgement   ADL Overall ADL's : Needs assistance/impaired Eating/Feeding: Moderate assistance;Sitting Eating/Feeding Details (indicate cue type and reason): due to myoclonic jerking Grooming: Moderate assistance Grooming Details (indicate cue type and reason): due to myoclonic jerking Upper Body Bathing: Moderate assistance;Maximal assistance Upper Body Bathing  Details (indicate cue type and reason): due to myoclonic jerking Lower Body Bathing: Total assistance;Sitting/lateral leans Lower Body Bathing Details (indicate cue type and reason): due to myoclonic jerking Upper Body Dressing : Moderate assistance;Sitting   Lower Body Dressing: Maximal assistance;Total assistance;Sitting/lateral leans   Toilet Transfer: Moderate assistance;+2 for physical assistance;+2 for safety/equipment           Functional mobility during ADLs: Moderate assistance;+2 for physical assistance;+2 for safety/equipment;Cueing for safety;Cueing for sequencing General ADL Comments: Patient presenting with cognitive impairments (potentially baseline), myoclonic jerking in BUEs, dizziness in sitting EOB (VSS) and need for increased assist to complete all ADLs and mobility     Vision Baseline Vision/History: 1 Wears glasses Ability to See in Adequate Light: 0 Adequate Patient Visual Report: Other (comment) (Will continue to assess) Additional Comments: Will continue to assess, decreased attention and labile     Perception     Praxis      Pertinent Vitals/Pain Pain Assessment Pain Assessment: Faces (Simultaneous filing. User may not have seen previous data.) Faces Pain Scale: Hurts little more (Simultaneous filing. User may not have seen previous data.) Pain Location: headache Pain Descriptors / Indicators: Headache Pain Intervention(s): Limited activity within patient's tolerance, Monitored during session, Repositioned     Hand Dominance Right (Simultaneous filing. User may not have seen previous data.)   Extremity/Trunk Assessment Upper Extremity Assessment Upper Extremity Assessment: Generalized weakness;RUE deficits/detail;LUE deficits/detail RUE Deficits / Details: myclonic jerking throughout L>R RUE Coordination: decreased gross motor;decreased fine motor LUE Deficits / Details: myclonic jerking throughout L>R, increased edema in L hand LUE Coordination:  decreased fine motor;decreased gross motor   Lower Extremity Assessment Lower Extremity Assessment: Defer to PT evaluation   Cervical / Trunk Assessment Cervical / Trunk Assessment: Normal   Communication Communication Communication: Other (comment) (Minimally garbled speech  Simultaneous filing. User may not have seen previous data.)   Cognition Arousal/Alertness: Awake/alert Behavior During Therapy: WFL for tasks assessed/performed Overall Cognitive Status: History of cognitive impairments - at baseline                                 General Comments: Patient able to state first name and birthdate, could not remember his last name. Patient also confused as to why he wasnt in the hospital in [facility] as he usually goes there. Patient with sustained attention and minimally labile as he did not understand why PT and OT "were asking him all these questions"     General Comments       Exercises     Shoulder Instructions      Home Living Family/patient expects to be discharged to:: Group home (Simultaneous filing. User may not have seen previous data.)                                 Additional Comments: Patient lives in a group home with 6 other guys per patient report, patient states that he has help to get dressed and usually takes a bath. Patient with waxing and waning cognition therefore PMH will need to be  corroborated with group home staff      Prior Functioning/Environment Prior Level of Function : Needs assist (Simultaneous filing. User may not have seen previous data.)             Mobility Comments: per patient, uses a RW and w/c (Simultaneous filing. User may not have seen previous data.) ADLs Comments: Patient lives in a group home with 6 other guys per patient report, patient states that he has help to get dressed and usually takes a bath. Patient with waxing and waning cognition therefore PMH will need to be corroborated with group  home staff (Simultaneous filing. User may not have seen previous data.)        OT Problem List: Decreased strength;Decreased range of motion;Decreased activity tolerance;Impaired balance (sitting and/or standing);Decreased coordination;Decreased cognition;Decreased safety awareness;Decreased knowledge of use of DME or AE;Decreased knowledge of precautions;Impaired UE functional use      OT Treatment/Interventions: Self-care/ADL training;Therapeutic exercise;Neuromuscular education;Energy conservation;Manual therapy;DME and/or AE instruction;Therapeutic activities;Cognitive remediation/compensation;Patient/family education;Balance training    OT Goals(Current goals can be found in the care plan section) Acute Rehab OT Goals Patient Stated Goal: to feel better OT Goal Formulation: With patient Time For Goal Achievement: 05/14/22 Potential to Achieve Goals: Fair  OT Frequency: Min 2X/week    Co-evaluation PT/OT/SLP Co-Evaluation/Treatment: Yes Reason for Co-Treatment: Necessary to address cognition/behavior during functional activity;For patient/therapist safety;To address functional/ADL transfers (Simultaneous filing. User may not have seen previous data.) PT goals addressed during session: Mobility/safety with mobility;Balance;Strengthening/ROM OT goals addressed during session: ADL's and self-care;Strengthening/ROM      AM-PAC OT "6 Clicks" Daily Activity     Outcome Measure Help from another person eating meals?: A Lot Help from another person taking care of personal grooming?: A Lot Help from another person toileting, which includes using toliet, bedpan, or urinal?: Total Help from another person bathing (including washing, rinsing, drying)?: A Lot Help from another person to put on and taking off regular upper body clothing?: A Lot Help from another person to put on and taking off regular lower body clothing?: Total 6 Click Score: 10   End of Session Nurse Communication: Mobility  status  Activity Tolerance: Patient tolerated treatment well Patient left: in bed;with call bell/phone within reach;with restraints reapplied;with bed alarm set  OT Visit Diagnosis: Unsteadiness on feet (R26.81);Other abnormalities of gait and mobility (R26.89);Muscle weakness (generalized) (M62.81);Other symptoms and signs involving cognitive function;Other symptoms and signs involving the nervous system (R29.898);Dizziness and giddiness (R42)                Time: [id number] OT Time Calculation (min): 30 min Charges:  OT General Charges $OT Visit: 1 Visit OT Evaluation $OT Eval Moderate Complexity: 1 Mod  Corinne Ports E. Sylver Vantassell, OTR/L Acute Rehabilitation Services [place] 04/30/2022, 3:43 PM

## 2022-04-30 NOTE — Progress Notes (Deleted)
Cardiology Office Note:    Date:  04/30/2022   ID:  Oscar Elliott, DOB [birth year], MRN [id number]  PCP:  Lauretta Grill, NP  [place] Providers Cardiologist:  Rozann Lesches, MD Cardiology APP:  Imogene Burn, PA-C { Click to update primary MD,subspecialty MD or APP then REFRESH:1}  *** Referring MD: Lauretta Grill, NP   Chief Complaint:  No chief complaint on file. {Click here for Visit Info    :1}    History of Present Illness:   Oscar Elliott is a 62 y.o. male with history of CAD/MI with prior history of DES to the RCA in 2015, hypertension, hyperlipidemia, HLD, PVD, DM 2,  He has history of left AKA and chronic LE edema on the right leg.      Patient last seen by [place] 01/2022 and stable.   Patient admitted 04/25/22 with UTI sepsis, seizure, AKI,metabolic acidosis, subdural hematoma stable on f/u MRI. Off antiplatelets due to SDH.     Past Medical History:  Diagnosis Date   CAD (coronary artery disease)    a. 10/2013 Lateral STEMI/PCI: LM nl, LAD min irregs, D1 occluded sub branch, LCX min irregs mid, OM large/patent, RCA 142m(2.75x28 Promus DES).   Cholelithiasis    Essential hypertension    History of renal failure    a. 06/2007: in setting of rhabdo post L AKA.   Hyperlipidemia    [place]ocardial infarction) ([facility] [place]    Nephrolithiasis    [place]ipheral vascular disease [place] [place]    a. 06/2007: s/p L AKA @ [facility]   Schizophrenia ([facility]    Type 2 diabetes mellitus ([facility]    Current Medications: No outpatient medications have been marked as taking for the 05/08/22 encounter (Appointment) with LImogene Burn PA-C.    Allergies:   Penicillins   Social History   Tobacco Use   Smoking status: Every Day    Packs/day: 0.25    Years: 20.00    Total pack years: 5.00    Types: Cigars, Cigarettes   Smokeless tobacco: Never   Tobacco comments:    1-2 cig daily  Vaping Use   Vaping Use: Never used  Substance Use Topics   Alcohol use: No    Alcohol/week: 0.0  standard drinks of alcohol   Drug use: No    Family Hx: The patient's family history includes Other in an other family member.  ROS     Physical Exam:    VS:  There were no vitals taken for this visit.    Wt Readings from Last 3 Encounters:  04/30/22 124 lb 5.4 oz (56.4 kg)  04/24/22 118 lb 6.2 oz (53.7 kg)  01/30/22 130 lb 3.2 oz (59.1 kg)    Physical Exam  GEN: Well nourished, well developed, in no acute distress  HEENT: normal  Neck: no JVD, carotid bruits, or masses Cardiac:RRR; no murmurs, rubs, or gallops  Respiratory:  clear to auscultation bilaterally, normal work of breathing GI: soft, nontender, nondistended, + BS Ext: without cyanosis, clubbing, or edema, Good distal pulses bilaterally MS: no deformity or atrophy  Skin: warm and dry, no rash Neuro:  Alert and Oriented x 3, Strength and sensation are intact Psych: euthymic mood, full affect        EKGs/Labs/Other Test Reviewed:    EKG:  EKG is *** ordered today.  The ekg ordered today demonstrates ***  Recent Labs: 04/30/2022: ALT 21; BUN 11; Creatinine, Ser 1.44; Hemoglobin 8.0; Magnesium 1.9; Platelets 105; Potassium 3.2; Sodium 145  Recent Lipid Panel Recent Labs    01/30/22 1419  CHOL 111  TRIG 60  HDL 53  LDLCALC 45     Prior CV Studies: {Select studies to display:26339}   Echo 04/30/22 IMPRESSIONS     1. Left ventricular ejection fraction, by estimation, is 60 to 65%. The  left ventricle has normal function. The left ventricle has no regional  wall motion abnormalities. Left ventricular diastolic parameters are  indeterminate.   2. Right ventricular systolic function is normal. The right ventricular  size is normal.   3. A small pericardial effusion is present. The pericardial effusion is  circumferential.   4. The mitral valve is normal in structure. Mild mitral valve  regurgitation. No evidence of mitral stenosis.   5. The aortic valve is normal in structure. Aortic valve  regurgitation is  not visualized. No aortic stenosis is present.   6. The inferior vena cava is normal in size with greater than 50%  respiratory variability, suggesting right atrial pressure of 3 mmHg.   FINDINGS   Left Ventricle: Left ventricular ejection fraction, by estimation, is 60  to 65%. The left ventricle has normal function. The left ventricle has no  regional wall motion abnormalities. The left ventricular internal cavity  size was normal in size. There is   no left ventricular hypertrophy. Left ventricular diastolic parameters  are indeterminate.   Right Ventricle: The right ventricular size is normal. No increase in  right ventricular wall thickness. Right ventricular systolic function is  normal.   Left Atrium: Left atrial size was normal in size.   Right Atrium: Right atrial size was normal in size.   Pericardium: A small pericardial effusion is present. The pericardial  effusion is circumferential. Presence of epicardial fat layer.   Mitral Valve: The mitral valve is normal in structure. Mild mitral valve  regurgitation. No evidence of mitral valve stenosis.   Tricuspid Valve: The tricuspid valve is normal in structure. Tricuspid  valve regurgitation is not demonstrated. No evidence of tricuspid  stenosis.   Aortic Valve: The aortic valve is normal in structure. Aortic valve  regurgitation is not visualized. No aortic stenosis is present.   Pulmonic Valve: The pulmonic valve was normal in structure. Pulmonic valve  regurgitation is not visualized. No evidence of pulmonic stenosis.   Aorta: The aortic root is normal in size and structure.   Venous: The inferior vena cava is normal in size with greater than 50%  respiratory variability, suggesting right atrial pressure of 3 mmHg.   IAS/Shunts: No atrial level shunt detected by color flow Doppler.    Echo: 11/21/2013 Conclusion - Left ventricle: Hypokinesis of the basal inferior, inferolateral and  inferoseptal walls. The cavity size was normal. There was moderate concentric hypertrophy. Systolic function was normal. The estimated ejection fraction was in the range of 50% to 55%. Wall motion was normal; there were no regional wall motion abnormalities. Left ventricular diastolic function parameters were normal. - Aortic valve: Trileaflet; normal thickness leaflets. There was no regurgitation. - Left atrium: The atrium was normal in size. - Right ventricle: The cavity size was normal. Wall thickness was moderately increased. Systolic function was mildly reduced. - Tricuspid valve: There was mild regurgitation. - Pulmonic valve: There was no regurgitation. - Pulmonary arteries: Systolic pressure was within the normal range. - Pericardium, extracardiac: A mild circumferential pericardial effusion was identified. Features were not consistent with tamponade physiology. Impressions: - Low normal LV function with hypokinesis in the basal inferoseptal, inferior and  inferolateral leads. Mildly reduced right ventricular systolic function.  Cardiac Cath: 11/20/2013 ANGIOGRAPHIC DATA: The left main coronary artery is widely patent.   The left anterior descending artery is a large vessel which wraps around the apex. There is minimal atherosclerosis in the LAD. There is a medium-sized first diagonal. There is one small branch off of the first diagonal which appears occluded, and fills by faint left to left collaterals.   The left circumflex artery is a large vessel. There is mild atherosclerosis in the mid vessel. There several small obtuse marginal vessels. There is one large, branching obtuse marginal vessel which is widely patent.   The right coronary artery is occluded in the midportion. After revascularization, it was noted that there is a medium-sized posterolateral artery. In a medium to large size posterior descending artery, both of which were widely patent.   LEFT VENTRICULOGRAM: Left  ventricular angiogram was not done. LVEDP was 17 mmHg.   PCI NARRATIVE: A JR 4 guiding catheter was used to engage the RCA. A pro-water wire was placed across the area disease in the mid RCA. IV heparin and eventually IV tirofiban were used for anticoagulation. ACT was used to check that the anticoagulation was therapeutic. A priority 1 aspiration catheter was used to perform aspiration thrombectomy. There is successful removal of thrombus and restoration of flow. A 2.5 x 20 balloon was used to predilate. A 2.75 x 28 promus drug-eluting stent was deployed across the area disease. The stent was post dilated with a 3.25 x 15 noncompliant balloon. There is no residual stenosis. Several doses of nitroglycerin were given. The patient tolerated the procedure well.   IMPRESSIONS:   Normal left main coronary artery. Widely patent left anterior descending artery. Trivial diagonal branch occluded. Mild atherosclerosis in the left circumflex artery and its branches. Occluded mid right coronary artery which was the culprit for his presentation. This was successfully treated with a 2.75 x 28 Promus drug-eluting stent, postdilated to 3.3 mm in diameter. Left ventricular systolic function not assessed. LVEDP 17 mmHg. RECOMMENDATION: Continue dual antiplatelet therapy for at least a year. He'll need aggressive secondary prevention including beta blocker, statin. He will be watched in the hospital for several days. We'll have to get his home medications restarted.   Risk Assessment/Calculations/Metrics:   {Does this patient have ATRIAL FIBRILLATION?:[phone]}          ASSESSMENT & PLAN:   No problem-specific Assessment & Plan notes found for this encounter.   CAD -no chest pain or SOB -continue current medications -ASA '81mg'$ , Lipitor '80mg'$  daily,  Lisinopril '10mg'$  daily, Metoprolol tartrate '50mg'$  BID, and nitro as needed No longer on antiplatelets with SDH   Mixed hyperlipidemia -no recent lipid panel, will  order today -continue lipitor   Hypertension -well controlled today, continue current medication regimen           {Are you ordering a CV Procedure (e.g. stress test, cath, DCCV, TEE, etc)?   Press F2        :[id number]   Dispo:  No follow-ups on file.   Medication Adjustments/Labs and Tests Ordered: Current medicines are reviewed at length with the patient today.  Concerns regarding medicines are outlined above.  Tests Ordered: No orders of the defined types were placed in this encounter.  Medication Changes: No orders of the defined types were placed in this encounter.  Signed, Ermalinda Barrios, PA-C  04/30/2022 1:17 PM    [address], [place], [place]  [address] Phone: [phone]; Fax: [phone]

## 2022-04-30 NOTE — Progress Notes (Signed)
SLP Cancellation Note  Patient Details Name: Statham Stormont MRN: [id number] DOB: Apr 17, 1960   Cancelled treatment:       Reason Eval/Treat Not Completed: Other (comment). RN able to complete Gertie Fey now that pt appropriately alert. Pt passed and RN advance diet to mech soft given missing dentition. No further SLP swallow eval needs.   Syble Picco, Katherene Ponto 04/30/2022, 3:00 PM

## 2022-04-30 NOTE — Procedures (Addendum)
Patient Name: Zaydon Doetsch  MRN: [id number]  Epilepsy Attending: Lora Havens  Referring Physician/Provider: Derek Jack, MD  Duration: 04/29/2022 1658 to 04/30/2022 0957   Patient history:  63 year old male who presented with generalized myoclonus. EEG to evaluate for seizure    Level of alertness: Awake   AEDs during EEG study: TPM   Technical aspects: This EEG study was done with scalp electrodes positioned according to the 10-20 International system of electrode placement. Electrical activity was reviewed with band pass filter of 1-'70Hz'$ , sensitivity of 7 uV/mm, display speed of 86m/sec with a '60Hz'$  notched filter applied as appropriate. EEG data were recorded continuously and digitally stored.  Video monitoring was available and reviewed as appropriate.   Description: The posterior dominant rhythm consists of 8 Hz activity of moderate voltage (25-35 uV) seen predominantly in posterior head regions, symmetric and reactive to eye opening and eye closing. Hyperventilation and photic stimulation were not performed.      IMPRESSION: This study is within normal limits. No seizures or epileptiform discharges were seen throughout the recording.  Please note a normal interictal EEG does not exclude  the diagnosis of epilepsy.  Gustavia Carie [name]

## 2022-04-30 NOTE — Progress Notes (Signed)
LTM EEG discontinued - no skin breakdown at unhook.   

## 2022-04-30 NOTE — Evaluation (Signed)
Physical Therapy Evaluation Patient Details Name: Oscar Elliott MRN: [id number] DOB: 04/16/1960 Today's Date: 04/30/2022  History of Present Illness  The pt is a 62 yo male presenting to [facility] 2/26 with headache and tremors, is alert but not oriented. Work up showed UTI, EEG on 2/28 showed 2 seizures, and transferred to [facility] for continuous EEG. 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  SDH stable on CT head on 3/2 and 3/3. Repeat LTM EEG on 3/4 within normal limits. PMHx of HTN, HLD, CAD c/b STEMI (s/p DES to RCA 10/2013), PVD (s/p L AKA), T2DM, CKD, schizophrenia   Clinical Impression  Pt in bed upon arrival of PT, agreeable to evaluation at this time. Prior to admission the pt was able to mobilize with use of RW and prosthetic, and reports he was able to don prosthetic without assist. The pt now presents with limitations in functional mobility, strength, activity tolerance, and seated balance due to above dx, and will continue to benefit from skilled PT to address these deficits. The pt was able to assist with transition to sitting EOB, but unable to maintain seated balance without minG-modA due to myoclonic jerking. VSS but due to poor endurance, pt returned to supine and unable to attempt transfer OOB at this time. Feel pt will benefit from SNF rehab to improve independence with OOB mobility prior to return to group home.          Recommendations for follow up therapy are one component of a multi-disciplinary discharge planning process, led by the attending physician.  Recommendations may be updated based on patient status, additional functional criteria and insurance authorization.  Follow Up Recommendations Skilled nursing-short term rehab (<3 hours/day) Can patient physically be transported by private vehicle: No    Assistance Recommended at Discharge Frequent or constant Supervision/Assistance  Patient can  return home with the following  A lot of help with walking and/or transfers;A lot of help with bathing/dressing/bathroom;Assistance with cooking/housework;Assistance with feeding;Direct supervision/assist for financial management;Direct supervision/assist for medications management;Help with stairs or ramp for entrance    Equipment Recommendations None recommended by PT  Recommendations for Other Services       Functional Status Assessment Patient has had a recent decline in their functional status and demonstrates the ability to make significant improvements in function in a reasonable and predictable amount of time.     Precautions / Restrictions Precautions Precautions: Fall Precaution Comments: L AKA, no prosthetic in room Restrictions Weight Bearing Restrictions: No      Mobility  Bed Mobility Overal bed mobility: Needs Assistance Bed Mobility: Rolling, Supine to Sit, Sit to Supine Rolling: Min guard   Supine to sit: Mod assist Sit to supine: Min assist   General bed mobility comments: Mod A to come into sitting (motor planning) but good use of BUEs to scoot to EOB and return to supine, patient requires external assist in order to maintain sitting balance due to myoclonic jerking      Modified Rankin (Stroke Patients Only) Modified Rankin (Stroke Patients Only) Pre-Morbid Rankin Score: Moderately severe disability Modified Rankin: Severe disability     Balance Overall balance assessment: Needs assistance Sitting-balance support: Bilateral upper extremity supported, Feet unsupported Sitting balance-Leahy Scale: Poor Sitting balance - Comments: minG-modA to maintain Postural control: Posterior lean  Pertinent Vitals/Pain Pain Assessment Pain Assessment: Faces Faces Pain Scale: Hurts little more Pain Location: headache Pain Descriptors / Indicators: Headache    Home Living Family/patient expects to be discharged  to:: Group home                   Additional Comments: Patient lives in a group home with 6 other guys per patient report, patient states that he has help to get dressed and usually takes a bath. Patient with waxing and waning cognition therefore PMH will need to be corroborated with group home staff    Prior Function Prior Level of Function : Needs assist             Mobility Comments: per patient, uses a RW and w/c ADLs Comments: Patient lives in a group home with 6 other guys per patient report, patient states that he has help to get dressed and usually takes a bath. Patient with waxing and waning cognition therefore PMH will need to be corroborated with group home staff     Hand Dominance   Dominant Hand: Right    Extremity/Trunk Assessment   Upper Extremity Assessment Upper Extremity Assessment: Generalized weakness;RUE deficits/detail;LUE deficits/detail RUE Deficits / Details: myclonic jerking throughout L>R RUE Coordination: decreased gross motor;decreased fine motor LUE Deficits / Details: myclonic jerking throughout L>R, increased edema in L hand LUE Coordination: decreased fine motor;decreased gross motor    Lower Extremity Assessment Lower Extremity Assessment: Defer to PT evaluation    Cervical / Trunk Assessment Cervical / Trunk Assessment: Kyphotic;Other exceptions Cervical / Trunk Exceptions: myoclonic jerking, mostly in arms but then pt unable to maintain upright trunk/posture with episodes  Communication   Communication: Other (comment) (Minimally garbled speech)  Cognition Arousal/Alertness: Awake/alert Behavior During Therapy: WFL for tasks assessed/performed Overall Cognitive Status: History of cognitive impairments - at baseline                                 General Comments: Patient able to state first name and birthdate, could not remember his last name. Patient also confused as to why he wasnt in the hospital in [facility]  as he usually goes there. Patient with sustained attention and minimally labile as he did not understand why PT and OT "were asking him all these questions"        General Comments General comments (skin integrity, edema, etc.): VSS on RA    Exercises     Assessment/Plan    PT Assessment Patient needs continued PT services  PT Problem List Decreased strength;Decreased balance;Decreased mobility;Decreased cognition       PT Treatment Interventions DME instruction;Gait training;Stair training;Functional mobility training;Therapeutic activities;Therapeutic exercise;Balance training;Patient/family education    PT Goals (Current goals can be found in the Care Plan section)  Acute Rehab PT Goals Patient Stated Goal: return to group home PT Goal Formulation: With patient Time For Goal Achievement: 05/14/22 Potential to Achieve Goals: Fair    Frequency Min 3X/week     Co-evaluation PT/OT/SLP Co-Evaluation/Treatment: Yes Reason for Co-Treatment: Necessary to address cognition/behavior during functional activity;For patient/therapist safety;To address functional/ADL transfers PT goals addressed during session: Mobility/safety with mobility;Balance;Strengthening/ROM OT goals addressed during session: ADL's and self-care;Strengthening/ROM       AM-PAC PT "6 Clicks" Mobility  Outcome Measure Help needed turning from your back to your side while in a flat bed without using bedrails?: A Little Help needed moving from lying on your back  to sitting on the side of a flat bed without using bedrails?: A Lot Help needed moving to and from a bed to a chair (including a wheelchair)?: A Lot Help needed standing up from a chair using your arms (e.g., wheelchair or bedside chair)?: Total Help needed to walk in hospital room?: Total Help needed climbing 3-5 steps with a railing? : Total 6 Click Score: 10    End of Session   Activity Tolerance: Patient tolerated treatment well;Patient limited  by fatigue Patient left: in bed;with call bell/phone within reach;with bed alarm set;with restraints reapplied Nurse Communication: Mobility status PT Visit Diagnosis: Other abnormalities of gait and mobility (R26.89);Muscle weakness (generalized) (M62.81)    Time: [id number] PT Time Calculation (min) (ACUTE ONLY): 29 min   Charges:   PT Evaluation $PT Eval Moderate Complexity: 1 Mod          West Carbo, PT, DPT   Acute Rehabilitation Department Office [place] Communication Preferred  Sandra Cockayne 04/30/2022, 4:49 PM

## 2022-04-30 NOTE — Progress Notes (Signed)
Subjective: No clinical seizures.  This morning, patient was awake, able to have our conversation.  Reported headache, unable to elaborate further.  ROS: negative except above  Examination  Vital signs in last 24 hours: Temp:  [98.4 F (36.9 C)-98.6 F (37 C)] 98.6 F (37 C) (03/04 0800) Pulse Rate:  [91-103] 103 (03/04 1300) Resp:  [12-28] 23 (03/04 1300) BP: (127-157)/(74-94) 138/81 (03/04 1300) SpO2:  [90 %-100 %] 100 % (03/04 1300) Weight:  [56.4 kg] 56.4 kg (03/04 0800)  General: lying in bed, NAD Neuro: Awake, alert, able to tell me his name but did not answer the orientation questions, follows simple one-step commands consistently, PERRLA, EOMI, rest of the cranial nerves II to XII appear grossly intact, 5/5 in right upper and right lower extremity, left upper extremity.  Left AKA  Basic Metabolic Panel: Recent Labs  Lab 04/24/22 0326 04/25/22 0618 04/26/22 0358 04/27/22 1116 04/28/22 0618 04/29/22 1230 04/30/22 0956  NA 139 149* 152* 148* 145 146* 145  K 3.7 3.1* 3.3* 3.1* 3.5 3.6 3.2*  CL 109 119* 123* 119* 118* 120* 118*  CO2 21* 20* 22 19* 21* 21* 24  GLUCOSE 173* 104* 94 159* 199* 138* 146*  BUN 51* 44* 37* '22 18 14 11  '$ CREATININE 2.83* 2.21* 2.23* 1.87* 1.68* 1.47* 1.44*  CALCIUM 7.6* 7.8* 7.9* 7.4* 7.6* 7.4* 7.3*  MG 2.0 2.5* 2.7* 2.3 2.1 2.0 1.9  PHOS 3.8 2.6 1.8* 2.1* 3.2  --   --     CBC: Recent Labs  Lab 04/26/22 0358 04/27/22 1116 04/28/22 0618 04/29/22 1230 04/30/22 0956  WBC 7.9 6.3 8.6 10.0 9.9  HGB 8.6* 8.0* 8.8* 8.5* 8.0*  HCT 25.5* 24.0* 25.9* 25.4* 23.5*  MCV 87.3 87.0 87.2 87.0 86.1  PLT 79* 72* 74* 87* 105*    Coagulation Studies: No results for input(s): "LABPROT", "INR" in the last 72 hours.  Imaging No new brain imaging overnight   ASSESSMENT AND PLAN: 62 year old male who presented with generalized myoclonus, EEG showed generalized spikes as well as 2 seizures with generalized onset.  Patient had prior history of  epilepsy.  Possibly had epilepsy but never symptomatic until now.  Idiopathic generalized epilepsy Subdural hematoma Headache -Continue topiramate 12.5 mg twice daily for epilepsy -Subdural hematoma has been stable.  Continue to hold antiplatelets -DC LTM EEG as no further seizures -One-time dose of Toradol for headache -Follow-up with Dr. April Manson at [facility] neurology associate.  Order placed -Of note at baseline per Dr. Cecil Cobbs note, patient can carry on a conversation but is not oriented.  He also extremely paranoid and lives in a group home.   -Continue seizure precautions -As needed IV Versed for clinical seizures while in the hospital  I have spent a total of   36 minutes with the patient reviewing hospital notes,  test results, labs and examining the patient as well as establishing an assessment and plan.  > 50% of time was spent in direct patient care.   Zeb Comfort Epilepsy [facility] For questions after 5pm please refer to AMION to reach the Neurologist on call

## 2022-04-30 NOTE — Progress Notes (Signed)
PROGRESS NOTE  Oscar Elliott [id number] DOB: 1960-04-15   PCP: Lauretta Grill, NP  Patient is from: Home.  DOA: 04/25/2022 LOS: 5  Chief complaints No chief complaint on file.    Brief Narrative / Interim history: 62 year old M with PMHx of HTN, HLD, CAD c/b STEMI (s/p DES to RCA 10/2013), PVD (s/p L AKA), T2DM, CKD, schizophrenia (baseline intermittent disorientation) and BPH presented to [facility] on 2/26 jerking right extremities and headache  In ED 2/26, he was jerking right extremities.  Afebrile, tachycardic to 117, BP soft 95/69, SpO2 99%. Labs were notable for WBC 8.4, Hgb 10.5, Plt 89. Electrolytes WNL. BUN 56 (18), Cr 3.2 (1.13). AST 45 (27), ALT 30 (32), Tbili WNL. CK 491, LA 2.9, PCT 20.51. UA with mod Hgb, large LE, mild protein.  UDS positive for tricyclic.  CXR and CT head without acute finding.  Renal US with stable nonobstructing 10m L renal calculus. Cultures obtained.  Started on broad-spectrum antibiotics and admitted.     Patient was noted to have E. coli bacteremia and E. coli UTI.antibiotic de-escalated to IV CTX.  Patient remained altered despite appropriate treatment of sepsis, he was having jerking movements, neurology was consulted, patient was loaded with valproic acid, then was started on 500 mg twice daily, [facility].  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  SDH stable on CT head on 3/2 and 3/3.  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  Antiepileptics changed to topiramate (see neuronote for rationale).  Repeat LTM EEG on 3/4 within normal limits.    Subjective: Seen and examined earlier this morning.  No major events overnight of this morning.  No complaints but not a great historian.  His speech is difficult to understand.  Feels "all right".  Talks about "airplane  flight".  He is oriented to self and follows command but not place or time.  Responds no to pain or difficulty breathing.  Objective: Vitals:   04/30/22 0700 04/30/22 0800 04/30/22 0900 04/30/22 1000  BP: 136/74 (!) 140/86 (!) 151/88 127/82  Pulse: 99 100 94 92  Resp: '18 16 16 19  '$ Temp:  98.6 F (37 C)    TempSrc:  Oral    SpO2: 100% 100% 100% 100%  Weight:  56.4 kg      Examination:  GENERAL: Appears frail.  No apparent distress. HEENT: MMM.  Vision and hearing grossly intact.  NECK: Supple.  No apparent JVD.  RESP:  No IWOB.  Fair aeration bilaterally. CVS:  RRR. Heart sounds normal.  ABD/GI/GU: BS+. Abd soft, NTND.  MSK/EXT:  Moves extremities.  Left AKA. SKIN: no apparent skin lesion or wound NEURO: Awake and alert.  Oriented to self but not time or place.  Follows commands.  No facial asymmetry.  PERRL.  No apparent focal neuro deficit. PSYCH: Calm. Normal affect.   Procedures:  Long-term EEG  Microbiology [facility]repeat blood culture NGTD  Assessment and plan: Active Problems:   Peripheral vascular disease (HCC)   Schizophrenia (HCC)   Essential hypertension   CAD -S/P RCA DES Sept 2015   Insulin dependent type 2 diabetes mellitus (HCC)   AKI ([place] kidney injury) ([place]   Acute metabolic encephalopathy   Oropharyngeal dysphagia   Normocytic anemia   Thrombocytopenia (  [place])   Physical deconditioning  Seizures disorder/seizure-like activity: Presented with jerking right extremity.  Initial CT head without acute finding.  MRI on 3/1 with 6 mm SDH which is a stable on subsequent CT.  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  Repeat LTM EEG on 3/4 within normal limits.  UDS with tricyclic's.  Antiepileptics changed to topiramate (see neuronote for rationale).   -Continue topiramate per neurology -Seizure precaution  Severe sepsis due to E. coli bacteremia and UTI:  POA.  Had some tachycardia, tachypnea, AKI and lactic acidosis on presentation.  Blood and urine cultures with pansensitive E. coli.  Repeat blood cultures NGTD. -Completed antibiotic course on 3/3.  Acute metabolic encephalopathy: Could be due to sepsis and possibly postictal.  Seems to be improving.  He is awake and alert but only oriented to self.  Follows commands. -Treat treatable causes -Continue IV thiamine -IV Haldol as needed.  Monitor EKG and optimize electrolytes -Minimize sedating medications -Reorientation and delirium precautions. -N.p.o. pending SLP eval  Schizophrenia with paranoia and visual hallucination: Reports seeing an airplane fight.  Otherwise stable. -Psychiatry following -On clozapine 100 mg daily and 500 mg at night  Oropharyngeal dysphagia: Evaluated by SLP on 2/29 and recommended dysphagia 3 diet.  Seems like he had worsening encephalopathy and now NPO. -Continue n.p.o. pending SLP eval  History of CAD with STEMI s/p DES 2015 PVD s/p L AKA -Off antiplatelets due to SDH. -Continue statin  Subdural hematoma: MRI on 3/1 showed 6 mm SDH with no mass effect.  Stable on subsequent CTs. -Continue monitoring clinically   AKI in the setting of sepsis: Resolving. Recent Labs    01/30/22 1419 04/23/22 1327 04/24/22 0326 04/25/22 0618 04/26/22 0358 04/27/22 1116 04/28/22 0618 04/29/22 1230 04/30/22 0956  BUN 18 56* 51* 44* 37* '22 18 14 11  '$ CREATININE 1.13 3.20* 2.83* 2.21* 2.23* 1.87* 1.68* 1.47* 1.44*  -Continue gentle IV fluid  Controlled IDDM-2 with hyperglycemia: A1c 6.7%. Recent Labs  Lab 04/29/22 1549 04/29/22 1944 04/29/22 2356 04/30/22 0351 04/30/22 0803  GLUCAP 135* 116* 138* 115* 133*  -Continue current insulin regimen  Normocytic anemia: H&H relatively stable. Recent Labs    04/23/22 1327 04/24/22 0326 04/25/22 0618 04/26/22 0358 04/27/22 1116 04/28/22 0618 04/29/22 1230 04/30/22 0956  HGB 10.5* 9.0* 9.2* 8.6* 8.0* 8.8* 8.5*  8.0*  -Continue monitoring -Check anemia panel in the morning  Thrombocytopenia: Improving. Recent Labs  Lab 04/23/22 1327 04/24/22 0326 04/25/22 0618 04/26/22 0358 04/27/22 1116 04/28/22 0618 04/29/22 1230 04/30/22 0956  PLT 89* 58* 73* 79* 72* 74* 87* 105*  -SCD for VTE prophylaxis  Hypernatremia: Mild. -Continue hypotonic fluid  Hypokalemia -Monitor replenish as appropriate  Physical deconditioning/left AKA -PT/OT    Body mass index is 23.49 kg/m.           DVT prophylaxis:  Place and maintain sequential compression device Start: 04/28/22 1301 SCDs Start: 04/25/22 1826  Code Status: Full code Family Communication: None at bedside Level of care: ICU.  Will transfer to progressive care. Status is: Inpatient Remains inpatient appropriate because: Possible seizure, encephalopathy, AKI and physical deconditioning   Final disposition: TBD Consultants:  Neurology Psychiatry  55 minutes with more than 50% spent in reviewing records, counseling patient/family and coordinating care.   Sch Meds:  Scheduled Meds:  Chlorhexidine Gluconate Cloth  6 each Topical Daily   cloZAPine  100 mg Oral Daily   cloZAPine  500 mg Oral QHS   insulin aspart  0-9 Units Subcutaneous Q4H   mouth rinse  15 mL Mouth Rinse 4 times per day   thiamine  100 mg Oral Daily   Or   thiamine (VITAMIN B1) injection  100 mg Intravenous Daily   topiramate  12.5 mg Oral BID   Continuous Infusions:  sodium chloride Stopped (04/29/22 1619)   cefTRIAXone (ROCEPHIN)  IV Stopped (04/29/22 1510)   dextrose 5 % and 0.45% NaCl 100 mL/hr at 04/30/22 0700   PRN Meds:.sodium chloride, docusate sodium, haloperidol lactate, LORazepam, ondansetron (ZOFRAN) IV, mouth rinse, mouth rinse, polyethylene glycol  Antimicrobials: Anti-infectives (From admission, onward)    Start     Dose/Rate Route Frequency Ordered Stop   04/26/22 1400  cefTRIAXone (ROCEPHIN) 2 g in sodium chloride 0.9 % 100 mL IVPB         2 g 200 mL/hr over 30 Minutes Intravenous Every 24 hours 04/25/22 1841 05/02/22 2359        I have personally reviewed the following labs and images: CBC: Recent Labs  Lab 04/23/22 1327 04/24/22 0326 04/26/22 0358 04/27/22 1116 04/28/22 0618 04/29/22 1230 04/30/22 0956  WBC 8.4   < > 7.9 6.3 8.6 10.0 9.9  NEUTROABS 7.1  --   --   --   --   --   --   HGB 10.5*   < > 8.6* 8.0* 8.8* 8.5* 8.0*  HCT 31.0*   < > 25.5* 24.0* 25.9* 25.4* 23.5*  MCV 86.6   < > 87.3 87.0 87.2 87.0 86.1  PLT 89*   < > 79* 72* 74* 87* 105*   < > = values in this interval not displayed.   BMP &GFR Recent Labs  Lab 04/24/22 0326 04/25/22 0618 04/26/22 0358 04/27/22 1116 04/28/22 0618 04/29/22 1230 04/30/22 0956  NA 139 149* 152* 148* 145 146* 145  K 3.7 3.1* 3.3* 3.1* 3.5 3.6 3.2*  CL 109 119* 123* 119* 118* 120* 118*  CO2 21* 20* 22 19* 21* 21* 24  GLUCOSE 173* 104* 94 159* 199* 138* 146*  BUN 51* 44* 37* '22 18 14 11  '$ CREATININE 2.83* 2.21* 2.23* 1.87* 1.68* 1.47* 1.44*  CALCIUM 7.6* 7.8* 7.9* 7.4* 7.6* 7.4* 7.3*  MG 2.0 2.5* 2.7* 2.3 2.1 2.0 1.9  PHOS 3.8 2.6 1.8* 2.1* 3.2  --   --    Estimated Creatinine Clearance: 39.3 mL/min (A) (by C-G formula based on SCr of 1.44 mg/dL (H)). Liver & Pancreas: Recent Labs  Lab 04/23/22 1327 04/26/22 0358 04/29/22 1230 04/30/22 0956  AST 45* 46* 33 25  ALT 30 33 23 21  ALKPHOS 87 63 61 66  BILITOT 0.8 1.0 0.6 0.6  PROT 6.5 5.3* 4.8* 5.1*  ALBUMIN 3.0* 2.0* 1.6* 1.7*   No results for input(s): "LIPASE", "AMYLASE" in the last 168 hours. Recent Labs  Lab 04/26/22 0358  AMMONIA 31   Diabetic: No results for input(s): "HGBA1C" in the last 72 hours. Recent Labs  Lab 04/29/22 1549 04/29/22 1944 04/29/22 2356 04/30/22 0351 04/30/22 0803  GLUCAP 135* 116* 138* 115* 133*   Cardiac Enzymes: Recent Labs  Lab 04/23/22 1327 04/24/22 0326 04/25/22 1538 04/30/22 0956  CKTOTAL 491* 323 233 71   No results for input(s): "PROBNP" in the  last 8760 hours. Coagulation Profile: Recent Labs  Lab 04/25/22 1620  INR 1.3*   Thyroid Function Tests: No results for input(s): "TSH", "T4TOTAL", "FREET4", "T3FREE", "THYROIDAB" in the last 72 hours. Lipid Profile: No results for input(s): "CHOL", "HDL", "LDLCALC", "TRIG", "  CHOLHDL", "LDLDIRECT" in the last 72 hours. Anemia Panel: No results for input(s): "VITAMINB12", "FOLATE", "FERRITIN", "TIBC", "IRON", "RETICCTPCT" in the last 72 hours. Urine analysis:    Component Value Date/Time   COLORURINE AMBER (A) 04/23/2022 1554   APPEARANCEUR CLOUDY (A) 04/23/2022 1554   APPEARANCEUR Clear 07/08/2013 1606   LABSPEC 1.014 04/23/2022 1554   LABSPEC 1.010 07/08/2013 1606   PHURINE 5.0 04/23/2022 1554   GLUCOSEU NEGATIVE 04/23/2022 1554   GLUCOSEU Negative 07/08/2013 1606   HGBUR MODERATE (A) 04/23/2022 1554   BILIRUBINUR NEGATIVE 04/23/2022 1554   BILIRUBINUR Negative 07/08/2013 1606   KETONESUR NEGATIVE 04/23/2022 1554   PROTEINUR 100 (A) 04/23/2022 1554   UROBILINOGEN 0.2 02/22/2014 1853   NITRITE NEGATIVE 04/23/2022 1554   LEUKOCYTESUR LARGE (A) 04/23/2022 1554   LEUKOCYTESUR 3+ 07/08/2013 1606   Sepsis Labs: Invalid input(s): "PROCALCITONIN", "LACTICIDVEN"  Microbiology: Recent Results (from the past 240 hour(s))  Urine Culture (for pregnant, neutropenic or urologic patients or patients with an indwelling urinary catheter)     Status: Abnormal   Collection Time: 04/23/22  3:53 PM   Specimen: Urine, Clean Catch  Result Value Ref Range Status   Specimen Description   Final    URINE, CLEAN CATCH Performed at [facility], [address], [place], [place] [address]    Special Requests   Final    NONE Performed at [facility], [address] [place], [place] [address]    Culture >=100,000 COLONIES/mL ESCHERICHIA COLI (A)  Final   Report Status 04/26/2022 FINAL  Final   Organism ID, Bacteria ESCHERICHIA COLI (A)  Final      Susceptibility    Escherichia coli - MIC*    AMPICILLIN 8 SENSITIVE Sensitive     CEFAZOLIN <=4 SENSITIVE Sensitive     CEFEPIME <=0.12 SENSITIVE Sensitive     CEFTRIAXONE <=0.25 SENSITIVE Sensitive     CIPROFLOXACIN <=0.25 SENSITIVE Sensitive     GENTAMICIN <=1 SENSITIVE Sensitive     IMIPENEM <=0.25 SENSITIVE Sensitive     NITROFURANTOIN <=16 SENSITIVE Sensitive     TRIMETH/SULFA <=20 SENSITIVE Sensitive     AMPICILLIN/SULBACTAM 4 SENSITIVE Sensitive     PIP/TAZO <=4 SENSITIVE Sensitive     * >=100,000 COLONIES/mL ESCHERICHIA COLI  Culture, blood (routine x 2)     Status: Abnormal   Collection Time: 04/23/22  3:54 PM   Specimen: BLOOD  Result Value Ref Range Status   Specimen Description   Final    BLOOD BLOOD RIGHT ARM Performed at [facility], [address] [place], [place] [address]    Special Requests   Final    BOTTLES DRAWN AEROBIC AND ANAEROBIC Blood Culture adequate volume Performed at [facility], [address] [place], [place] [address]    Culture  Setup Time   Final    GRAM NEGATIVE RODS IN BOTH AEROBIC AND ANAEROBIC BOTTLES CRITICAL RESULT CALLED TO, READ BACK BY AND VERIFIED WITHPamelia Hoit [facility] [id number] 04/24/22 HNM Performed at [facility], [address] [place], [place] [address]    Culture ESCHERICHIA COLI (A)  Final   Report Status 04/26/2022 FINAL  Final   Organism ID, Bacteria ESCHERICHIA COLI  Final      Susceptibility   Escherichia coli - MIC*    AMPICILLIN 8 SENSITIVE Sensitive     CEFEPIME <=0.12 SENSITIVE Sensitive     CEFTAZIDIME <=1 SENSITIVE Sensitive     CEFTRIAXONE <=0.25 SENSITIVE Sensitive     CIPROFLOXACIN <=0.25 SENSITIVE Sensitive  GENTAMICIN <=1 SENSITIVE Sensitive     IMIPENEM <=0.25 SENSITIVE Sensitive     TRIMETH/SULFA <=20 SENSITIVE Sensitive     AMPICILLIN/SULBACTAM <=2 SENSITIVE Sensitive     PIP/TAZO <=4 SENSITIVE Sensitive     * ESCHERICHIA COLI  Culture, blood (routine x 2)     Status: Abnormal    Collection Time: 04/23/22  3:54 PM   Specimen: BLOOD  Result Value Ref Range Status   Specimen Description   Final    BLOOD BLOOD LEFT ARM Performed at [facility], [address], [place], [place] [address]    Special Requests   Final    BOTTLES DRAWN AEROBIC AND ANAEROBIC Blood Culture adequate volume Performed at [facility], [place] [place], [place] [address]    Culture  Setup Time   Final    GRAM NEGATIVE RODS IN BOTH AEROBIC AND ANAEROBIC BOTTLES CRITICAL VALUE NOTED.  VALUE IS CONSISTENT WITH PREVIOUSLY REPORTED AND CALLED VALUE. Performed at [facility], [address] [place], [place] [address]    Culture (A)  Final    ESCHERICHIA COLI SUSCEPTIBILITIES PERFORMED ON PREVIOUS CULTURE WITHIN THE LAST 5 DAYS. Performed at [facility], [place] [address] [place], [place] [address]    Report Status 04/26/2022 FINAL  Final  Blood Culture ID Panel (Reflexed)     Status: Abnormal   Collection Time: 04/23/22  3:54 PM  Result Value Ref Range Status   Enterococcus faecalis NOT DETECTED NOT DETECTED Final   Enterococcus Faecium NOT DETECTED NOT DETECTED Final   Listeria monocytogenes NOT DETECTED NOT DETECTED Final   Staphylococcus species NOT DETECTED NOT DETECTED Final   Staphylococcus aureus (BCID) NOT DETECTED NOT DETECTED Final   Staphylococcus epidermidis NOT DETECTED NOT DETECTED Final   Staphylococcus lugdunensis NOT DETECTED NOT DETECTED Final   Streptococcus species NOT DETECTED NOT DETECTED Final   Streptococcus agalactiae NOT DETECTED NOT DETECTED Final   Streptococcus pneumoniae NOT DETECTED NOT DETECTED Final   Streptococcus pyogenes NOT DETECTED NOT DETECTED Final   A.calcoaceticus-baumannii NOT DETECTED NOT DETECTED Final   Bacteroides fragilis NOT DETECTED NOT DETECTED Final   Enterobacterales DETECTED (A) NOT DETECTED Corrected    Comment: Enterobacterales represent a large order of gram negative bacteria, not a  single organism. CRITICAL RESULT CALLED TO, READ BACK BY AND VERIFIED WITH: Pamelia Hoit PHARMD [id number] 04/24/22 HNM CORRECTED ON 02/27 AT [id number]: PREVIOUSLY REPORTED AS DETECTED Enterobacterales represent a large order of gram negative bacteria, not a single organism. CRITICAL RESULT CALLED TO, READ BACK BY AND VERIFIED WITH: MORGAN GOBBLE PHARMD 04/24/22 HNM    Enterobacter cloacae complex NOT DETECTED NOT DETECTED Final   Escherichia coli DETECTED (A) NOT DETECTED Corrected    Comment: CRITICAL RESULT CALLED TO, READ BACK BY AND VERIFIED WITH: Pamelia Hoit PHARMD [id number] 04/24/22 HNM CORRECTED ON 02/27 AT [id number]: PREVIOUSLY REPORTED AS DETECTED CRITICAL RESULT CALLED TO, READ BACK BY AND VERIFIED WITH: MORGAN GOBBLE PHARMD 04/24/22 HNM    Klebsiella aerogenes NOT DETECTED NOT DETECTED Final   Klebsiella oxytoca NOT DETECTED NOT DETECTED Final   Klebsiella pneumoniae NOT DETECTED NOT DETECTED Final   Proteus species NOT DETECTED NOT DETECTED Final   Salmonella species NOT DETECTED NOT DETECTED Final   Serratia marcescens NOT DETECTED NOT DETECTED Final   Haemophilus influenzae NOT DETECTED NOT DETECTED Final   Neisseria meningitidis NOT DETECTED NOT DETECTED Final   Pseudomonas aeruginosa NOT DETECTED NOT DETECTED Final   Stenotrophomonas maltophilia NOT DETECTED NOT DETECTED Final  Candida albicans NOT DETECTED NOT DETECTED Final   Candida auris NOT DETECTED NOT DETECTED Final   Candida glabrata NOT DETECTED NOT DETECTED Final   Candida krusei NOT DETECTED NOT DETECTED Final   Candida parapsilosis NOT DETECTED NOT DETECTED Final   Candida tropicalis NOT DETECTED NOT DETECTED Final   Cryptococcus neoformans/gattii NOT DETECTED NOT DETECTED Final   CTX-M ESBL NOT DETECTED NOT DETECTED Final   Carbapenem resistance IMP NOT DETECTED NOT DETECTED Final   Carbapenem resistance KPC NOT DETECTED NOT DETECTED Final   Carbapenem resistance NDM NOT DETECTED NOT DETECTED Final   Carbapenem resist OXA 48  LIKE NOT DETECTED NOT DETECTED Final   Carbapenem resistance VIM NOT DETECTED NOT DETECTED Final    Comment: Performed at [facility], [address], [place], [place] [address]  Urine Culture (for pregnant, neutropenic or urologic patients or patients with an indwelling urinary catheter)     Status: None   Collection Time: 04/24/22  8:23 AM   Specimen: Urine, Clean Catch  Result Value Ref Range Status   Specimen Description   Final    URINE, CLEAN CATCH Performed at [facility], [address] [place], [place] [address]    Special Requests   Final    NONE Performed at [facility], [address] [place], [place] [address]    Culture   Final    NO GROWTH Performed at [facility], [place] [address] [place], [place] [address]    Report Status 04/26/2022 FINAL  Final  Culture, blood (Routine X 2) w Reflex to ID Panel     Status: None   Collection Time: 04/25/22  6:18 AM   Specimen: BLOOD RIGHT ARM  Result Value Ref Range Status   Specimen Description BLOOD RIGHT ARM  Final   Special Requests IN PEDIATRIC BOTTLE Blood Culture adequate volume  Final   Culture   Final    NO GROWTH 5 DAYS Performed at [facility], [address], [place], [place] [address]    Report Status 04/30/2022 FINAL  Final  Culture, blood (Routine X 2) w Reflex to ID Panel     Status: None   Collection Time: 04/25/22  6:18 AM   Specimen: BLOOD  Result Value Ref Range Status   Specimen Description BLOOD RIGHT ARM  Final   Special Requests   Final    BOTTLES DRAWN AEROBIC ONLY Blood Culture adequate volume   Culture   Final    NO GROWTH 5 DAYS Performed at [facility], [address], [place], [place] [address]    Report Status 04/30/2022 FINAL  Final  MRSA Next Gen by PCR, Nasal     Status: None   Collection Time: 04/25/22  2:39 PM   Specimen: Nasal Mucosa; Nasal Swab  Result Value Ref Range Status   MRSA by PCR Next Gen NOT DETECTED NOT  DETECTED Final    Comment: (NOTE) The GeneXpert MRSA Assay (FDA approved for NASAL specimens only), is one component of a comprehensive MRSA colonization surveillance program. It is not intended to diagnose MRSA infection nor to guide or monitor treatment for MRSA infections. Test performance is not FDA approved in patients less than 12 years old. Performed at [facility], [address] [place],  [address]     Radiology Studies: Overnight EEG with video  Result Date: 04/30/2022 Lora Havens, MD     04/30/2022 10:21 AM Patient Name: Clanton Cureton MRN: [id number] Epilepsy Attending:  Lora Havens Referring Physician/Provider: Derek Jack, MD Duration: 04/29/2022 1658 to 04/30/2022 0957  Patient history:  62 year old male who presented with generalized myoclonus. EEG to evaluate for seizure  Level of alertness: Awake  AEDs during EEG study: TPM  Technical aspects: This EEG study was done with scalp electrodes positioned according to the 10-20 International system of electrode placement. Electrical activity was reviewed with band pass filter of 1-'70Hz'$ , sensitivity of 7 uV/mm, display speed of 42m/sec with a '60Hz'$  notched filter applied as appropriate. EEG data were recorded continuously and digitally stored.  Video monitoring was available and reviewed as appropriate.  Description: The posterior dominant rhythm consists of 8 Hz activity of moderate voltage (25-35 uV) seen predominantly in posterior head regions, symmetric and reactive to eye opening and eye closing. Hyperventilation and photic stimulation were not performed.    IMPRESSION: This study is within normal limits. No seizures or epileptiform discharges were seen throughout the recording. Please note a normal interictal EEG does not exclude  the diagnosis of epilepsy. PParnell  CT HEAD WO CONTRAST (5MM)  Result Date: 04/29/2022 CLINICAL DATA:  Neuro deficit, acute stroke suspected. EXAM: CT HEAD WITHOUT  CONTRAST TECHNIQUE: Contiguous axial images were obtained from the base of the skull through the vertex without intravenous contrast. RADIATION DOSE REDUCTION: This exam was performed according to the departmental dose-optimization program which includes automated exposure control, adjustment of the mA and/or kV according to patient size and/or use of iterative reconstruction technique. COMPARISON:  CT examination dated April 28, 2022 FINDINGS: Brain: Stable appearance of the subdural hemorrhage along the tentorium, posterior falx and right of the posterior parietal/occipital lobe. No evidence of hydrocephalus. No significant interval change. Vascular: No hyperdense vessel or unexpected calcification. Skull: Normal. Negative for fracture or focal lesion. Sinuses/Orbits: No acute finding. Other: None IMPRESSION: Stable appearance of the subdural hemorrhage along the tentorium, posterior falx and right of the posterior parietal/occipital lobe. No evidence of hydrocephalus. No significant interval change. Electronically Signed   By: IKeane PoliceD.O.   On: 04/29/2022 13:20      Vaidehi Braddy T. GHolden If 7PM-7AM, please contact night-coverage [email or web address] 04/30/2022, 10:49 AM

## 2022-05-01 DIAGNOSIS — E876 Hypokalemia: Secondary | ICD-10-CM

## 2022-05-01 LAB — LIPID PANEL
Cholesterol: 68 mg/dL (ref 0–200)
HDL: 18 mg/dL — ABNORMAL LOW (ref >40)
LDL Cholesterol: 37 mg/dL (ref 0–99)
Total CHOL/HDL Ratio: 3.8 RATIO
Triglycerides: 64 mg/dL (ref <150)
VLDL: 13 mg/dL (ref 0–40)

## 2022-05-01 LAB — GLUCOSE, CAPILLARY
Glucose-Capillary: 122 mg/dL — ABNORMAL HIGH (ref 70–99)
Glucose-Capillary: 135 mg/dL — ABNORMAL HIGH (ref 70–99)
Glucose-Capillary: 128 mg/dL — ABNORMAL HIGH (ref 70–99)
Glucose-Capillary: 124 mg/dL — ABNORMAL HIGH (ref 70–99)
Glucose-Capillary: 113 mg/dL — ABNORMAL HIGH (ref 70–99)
Glucose-Capillary: 107 mg/dL — ABNORMAL HIGH (ref 70–99)
Glucose-Capillary: 124 mg/dL — ABNORMAL HIGH (ref 70–99)

## 2022-05-01 LAB — CBC WITH DIFFERENTIAL/PLATELET
Abs Immature Granulocytes: 0.23 10*3/uL — ABNORMAL HIGH (ref 0.00–0.07)
Basophils Absolute: 0 10*3/uL (ref 0.0–0.1)
Basophils Relative: 0 %
Eosinophils Absolute: 0 10*3/uL (ref 0.0–0.5)
Eosinophils Relative: 0 %
HCT: 22.2 % — ABNORMAL LOW (ref 39.0–52.0)
Hemoglobin: 7.7 g/dL — ABNORMAL LOW (ref 13.0–17.0)
Immature Granulocytes: 3 %
Lymphocytes Relative: 15 %
Lymphs Abs: 1.3 10*3/uL (ref 0.7–4.0)
MCH: 29.3 pg (ref 26.0–34.0)
MCHC: 34.7 g/dL (ref 30.0–36.0)
MCV: 84.4 fL (ref 80.0–100.0)
Monocytes Absolute: 0.3 10*3/uL (ref 0.1–1.0)
Monocytes Relative: 4 %
Neutro Abs: 6.7 10*3/uL (ref 1.7–7.7)
Neutrophils Relative %: 78 %
Platelets: 120 10*3/uL — ABNORMAL LOW (ref 150–400)
RBC: 2.63 MIL/uL — ABNORMAL LOW (ref 4.22–5.81)
RDW: 17.3 % — ABNORMAL HIGH (ref 11.5–15.5)
WBC: 8.5 10*3/uL (ref 4.0–10.5)
nRBC: 0.2 % (ref 0.0–0.2)

## 2022-05-01 LAB — COMPREHENSIVE METABOLIC PANEL
ALT: 25 U/L (ref 0–44)
AST: 28 U/L (ref 15–41)
Albumin: 1.7 g/dL — ABNORMAL LOW (ref 3.5–5.0)
Alkaline Phosphatase: 72 U/L (ref 38–126)
Anion gap: 5 (ref 5–15)
BUN: 9 mg/dL (ref 8–23)
CO2: 23 mmol/L (ref 22–32)
Calcium: 7.7 mg/dL — ABNORMAL LOW (ref 8.9–10.3)
Chloride: 116 mmol/L — ABNORMAL HIGH (ref 98–111)
Creatinine, Ser: 1.42 mg/dL — ABNORMAL HIGH (ref 0.61–1.24)
GFR, Estimated: 56 mL/min — ABNORMAL LOW (ref >60)
Glucose, Bld: 134 mg/dL — ABNORMAL HIGH (ref 70–99)
Potassium: 3.5 mmol/L (ref 3.5–5.1)
Sodium: 144 mmol/L (ref 135–145)
Total Bilirubin: 0.6 mg/dL (ref 0.3–1.2)
Total Protein: 5.4 g/dL — ABNORMAL LOW (ref 6.5–8.1)

## 2022-05-01 LAB — FERRITIN: Ferritin: 336 ng/mL (ref 24–336)

## 2022-05-01 LAB — VITAMIN B12: Vitamin B-12: 964 pg/mL — ABNORMAL HIGH (ref 180–914)

## 2022-05-01 LAB — RETICULOCYTES
Immature Retic Fract: 31.8 % — ABNORMAL HIGH (ref 2.3–15.9)
RBC.: 2.62 MIL/uL — ABNORMAL LOW (ref 4.22–5.81)
Retic Count, Absolute: 34.6 10*3/uL (ref 19.0–186.0)
Retic Ct Pct: 1.3 % (ref 0.4–3.1)

## 2022-05-01 LAB — IRON AND TIBC
Iron: 41 ug/dL — ABNORMAL LOW (ref 45–182)
Saturation Ratios: 27 % (ref 17.9–39.5)
TIBC: 153 ug/dL — ABNORMAL LOW (ref 250–450)
UIBC: 112 ug/dL

## 2022-05-01 LAB — FOLATE: Folate: 7.1 ng/mL (ref >5.9)

## 2022-05-01 LAB — TSH: TSH: 1.957 u[IU]/mL (ref 0.350–4.500)

## 2022-05-01 LAB — MAGNESIUM: Magnesium: 1.7 mg/dL (ref 1.7–2.4)

## 2022-05-01 LAB — PHOSPHORUS: Phosphorus: 1.3 mg/dL — ABNORMAL LOW (ref 2.5–4.6)

## 2022-05-01 MED ORDER — HALOPERIDOL LACTATE 5 MG/ML IJ SOLN: 2.0000 mg | Freq: Four times a day (QID) | INTRAMUSCULAR | Status: DC | PRN

## 2022-05-01 MED ORDER — POTASSIUM PHOSPHATES 15 MMOLE/5ML IV SOLN
30.0000 mmol | Freq: Once | INTRAVENOUS | Status: AC
  Administered 2022-05-01: 30 mmol via INTRAVENOUS
  Filled 2022-05-01: qty 10

## 2022-05-01 NOTE — Progress Notes (Signed)
PROGRESS NOTE  Oscar Elliott [id number] DOB: 1960-11-14   PCP: Lauretta Grill, NP  Patient is from: Home.  DOA: 04/25/2022 LOS: 6  Chief complaints No chief complaint on file.    Brief Narrative / Interim history: 62 year old M with PMHx of HTN, HLD, CAD c/b STEMI (s/p DES to RCA 10/2013), PVD (s/p L AKA), T2DM, CKD, schizophrenia (baseline intermittent disorientation) and BPH presented to [facility] on 2/26 jerking right extremities and headache  In ED 2/26, he was jerking right extremities.  Afebrile, tachycardic to 117, BP soft 95/69, SpO2 99%. Labs were notable for WBC 8.4, Hgb 10.5, Plt 89. Electrolytes WNL. BUN 56 (18), Cr 3.2 (1.13). AST 45 (27), ALT 30 (32), Tbili WNL. CK 491, LA 2.9, PCT 20.51. UA with mod Hgb, large LE, mild protein.  UDS positive for tricyclic.  CXR and CT head without acute finding.  Renal US with stable nonobstructing 28m L renal calculus. Cultures obtained.  Started on broad-spectrum antibiotics and admitted.     Patient was noted to have E. coli bacteremia and E. coli UTI.antibiotic de-escalated to IV CTX.  Patient remained altered despite appropriate treatment of sepsis, he was having jerking movements, neurology was consulted, patient was loaded with valproic acid, then was started on 500 mg twice daily, [facility].  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  SDH stable on CT head on 3/2 and 3/3.  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  Antiepileptics changed to topiramate (see neuronote for rationale).  Repeat LTM EEG on 3/4 within normal limits.  Therapy recommended SNF.    Subjective: Seen and examined earlier this morning.  No major events overnight of this morning.  Reports hurting all over.  Does not appear to be in much distress.  He is not a great historian.  He  is only oriented to self.   Objective: Vitals:   04/30/22 2309 05/01/22 0315 05/01/22 0323 05/01/22 0746  BP: (!) 140/77 138/85  135/87  Pulse: 97 (!) 110  (!) 105  Resp: '18 14  18  '$ Temp: 98.9 F (37.2 C) 98.9 F (37.2 C)  98.2 F (36.8 C)  TempSrc: Oral     SpO2: 100% 100%  99%  Weight:   59.3 kg     Examination:  GENERAL: No apparent distress.  Nontoxic. HEENT: MMM.  Vision and hearing grossly intact.  NECK: Supple.  No apparent JVD.  RESP:  No IWOB.  Fair aeration bilaterally. CVS:  RRR. Heart sounds normal.  ABD/GI/GU: BS+. Abd soft, NTND.  MSK/EXT:   Left AKA.  Moves extremities.  Symmetric strength.  Trace edema in all extremities. SKIN: no apparent skin lesion or wound NEURO: Awake but not quite alert.  Oriented to self.  Follows commands.  No facial asymmetry.  PERRL.  No apparent focal neuro deficit. PSYCH: Calm. Normal affect.   Procedures:  Long-term EEG  Microbiology summarized[facility]-repeat blood culture NGTD  Assessment and plan: Active Problems:   Peripheral vascular disease (HCC)   Schizophrenia (HCC)   Essential hypertension   CAD -S/P RCA DES Sept 2015   Insulin dependent type 2 diabetes mellitus (HCC)   [place]   Acute metabolic encephalopathy   Oropharyngeal dysphagia   Normocytic anemia   Thrombocytopenia (  [place])   Physical deconditioning  Seizures disorder/seizure-like activity: Presented with jerking right extremity.  Initial CT head without acute finding.  MRI on 3/1 with 6 mm SDH which is a stable on subsequent CT.  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  Repeat LTM EEG on 3/4 within normal limits.  UDS with tricyclic's.  Antiepileptics changed to topiramate (see neuronote for rationale).   -Continue topiramate per neurology -Seizure precaution -Outpatient follow-up with neurology  Severe sepsis due to E. coli bacteremia and  UTI: POA.  Had some tachycardia, tachypnea, AKI and lactic acidosis on presentation.  Blood and urine cultures with pansensitive E. coli.  Repeat blood cultures NGTD. -Continue ceftriaxone through [id number]  Acute metabolic encephalopathy: Could be due to sepsis and possibly postictal.  Seems to be improving.  He is awake and alert but only oriented to self.  Follows commands. -Treat treatable causes -Continue IV thiamine -IV Haldol as needed.  Monitor EKG and optimize electrolytes -Minimize sedating medications -Reorientation and delirium precautions.  Schizophrenia with paranoia and visual hallucination: Reports seeing an Web designer.  Otherwise stable. -Appreciate psychiatry recs on 3/2  -Clozaril 100 mg PO Qam and 500 mg PO QHS  -Depakote 500 mg PO Q12.  Discontinued by neurology due to subdural hematoma -PRN Ativan and Haldol for agitation.  Patient did not require those.  Discontinued Ativan.  Decreased Haldol. -Restart trazodone 100 mg PO QHS if patient is having difficulty sleeping through the night.  Did not require.  Oropharyngeal dysphagia:  -Continue dysphagia 3 diet per SLP  History of CAD with STEMI s/p DES 2015 PVD s/p L AKA -Off antiplatelets due to SDH. -Continue statin  Subdural hematoma: MRI on 3/1 showed 6 mm SDH with no mass effect.  Stable on subsequent CTs. -Continue monitoring clinically   AKI in the setting of sepsis: Resolving. Recent Labs    01/30/22 1419 04/23/22 1327 04/24/22 0326 04/25/22 0618 04/26/22 0358 04/27/22 1116 04/28/22 0618 04/29/22 1230 04/30/22 0956 05/01/22 0823  BUN 18 56* 51* 44* 37* '22 18 14 11 9  '$ CREATININE 1.13 3.20* 2.83* 2.21* 2.23* 1.87* 1.68* 1.47* 1.44* 1.42*  -Discontinue IV fluid. -Encourage oral intake  Controlled IDDM-2 with hyperglycemia: A1c 6.7%. Recent Labs  Lab 04/30/22 1951 04/30/22 2151 04/30/22 2312 05/01/22 0314 05/01/22 0748  GLUCAP 122* 140* 153* 135* 128*  -Continue current insulin  regimen  Normocytic anemia: Slight drop in Hgb likely due to critical illness and hemodilution.  No obvious bleeding.  Anemia panel consistent with anemia of chronic disease. Recent Labs    04/23/22 1327 04/24/22 0326 04/25/22 0618 04/26/22 0358 04/27/22 1116 04/28/22 0618 04/29/22 1230 04/30/22 0956 05/01/22 0823  HGB 10.5* 9.0* 9.2* 8.6* 8.0* 8.8* 8.5* 8.0* 7.7*  -Discontinue IV fluid -Recheck in the morning  Thrombocytopenia: Improving. Recent Labs  Lab 04/25/22 0618 04/26/22 0358 04/27/22 1116 04/28/22 0618 04/29/22 1230 04/30/22 0956 05/01/22 0823  PLT 73* 79* 72* 74* 87* 105* 120*  -SCD for VTE prophylaxis in the setting of subdural hematoma  Hypernatremia: Resolved. -Continue hypotonic fluid  Hypokalemia/hypophosphatemia -IV potassium phosphate 30 mmol x 1  Physical deconditioning/left AKA -PT/OT-recommended SNF   Inadequate oral intake Body mass index is 24.7 kg/m. -Consult dietitian          DVT prophylaxis:  Place and maintain sequential compression device Start: 04/28/22 1301 SCDs Start: 04/25/22 1826  Code Status: Full code Family Communication: None at bedside Level of care: Progressive.  Will transfer to progressive care. Status  is: Inpatient Remains inpatient appropriate because: Seizure, sepsis, encephalopathy and physical deconditioning   Final disposition: SNF Consultants:  Neurology Psychiatry  55 minutes with more than 50% spent in reviewing records, counseling patient/family and coordinating care.   Sch Meds:  Scheduled Meds:  Chlorhexidine Gluconate Cloth  6 each Topical Daily   cloZAPine  100 mg Oral Daily   cloZAPine  500 mg Oral QHS   insulin aspart  0-9 Units Subcutaneous Q4H   thiamine  100 mg Oral Daily   Or   thiamine (VITAMIN B1) injection  100 mg Intravenous Daily   topiramate  12.5 mg Oral BID   Continuous Infusions:  sodium chloride Stopped (04/29/22 1619)   cefTRIAXone (ROCEPHIN)  IV Stopped (04/30/22  1521)   potassium PHOSPHATE IVPB (in mmol)     PRN Meds:.sodium chloride, docusate sodium, haloperidol lactate, ondansetron (ZOFRAN) IV, mouth rinse, polyethylene glycol  Antimicrobials: Anti-infectives (From admission, onward)    Start     Dose/Rate Route Frequency Ordered Stop   04/26/22 1400  cefTRIAXone (ROCEPHIN) 2 g in sodium chloride 0.9 % 100 mL IVPB        2 g 200 mL/hr over 30 Minutes Intravenous Every 24 hours 04/25/22 1841 05/02/22 2359        I have personally reviewed the following labs and images: CBC: Recent Labs  Lab 04/27/22 1116 04/28/22 0618 04/29/22 1230 04/30/22 0956 05/01/22 0823  WBC 6.3 8.6 10.0 9.9 8.5  NEUTROABS  --   --   --   --  6.7  HGB 8.0* 8.8* 8.5* 8.0* 7.7*  HCT 24.0* 25.9* 25.4* 23.5* 22.2*  MCV 87.0 87.2 87.0 86.1 84.4  PLT 72* 74* 87* 105* 120*   BMP &GFR Recent Labs  Lab 04/25/22 0618 04/26/22 0358 04/27/22 1116 04/28/22 0618 04/29/22 1230 04/30/22 0956 05/01/22 0823  NA 149* 152* 148* 145 146* 145 144  K 3.1* 3.3* 3.1* 3.5 3.6 3.2* 3.5  CL 119* 123* 119* 118* 120* 118* 116*  CO2 20* 22 19* 21* 21* 24 23  GLUCOSE 104* 94 159* 199* 138* 146* 134*  BUN 44* 37* '22 18 14 11 9  '$ CREATININE 2.21* 2.23* 1.87* 1.68* 1.47* 1.44* 1.42*  CALCIUM 7.8* 7.9* 7.4* 7.6* 7.4* 7.3* 7.7*  MG 2.5* 2.7* 2.3 2.1 2.0 1.9 1.7  PHOS 2.6 1.8* 2.1* 3.2  --   --  1.3*   Estimated Creatinine Clearance: 39.9 mL/min (A) (by C-G formula based on SCr of 1.42 mg/dL (H)). Liver & Pancreas: Recent Labs  Lab 04/26/22 0358 04/29/22 1230 04/30/22 0956 05/01/22 0823  AST 46* 33 25 28  ALT 33 '23 21 25  '$ ALKPHOS 63 61 66 72  BILITOT 1.0 0.6 0.6 0.6  PROT 5.3* 4.8* 5.1* 5.4*  ALBUMIN 2.0* 1.6* 1.7* 1.7*   No results for input(s): "LIPASE", "AMYLASE" in the last 168 hours. Recent Labs  Lab 04/26/22 0358  AMMONIA 31   Diabetic: No results for input(s): "HGBA1C" in the last 72 hours. Recent Labs  Lab 04/30/22 1951 04/30/22 2151 04/30/22 2312  05/01/22 0314 05/01/22 0748  GLUCAP 122* 140* 153* 135* 128*   Cardiac Enzymes: Recent Labs  Lab 04/25/22 1538 04/30/22 0956  CKTOTAL 233 71   No results for input(s): "PROBNP" in the last 8760 hours. Coagulation Profile: Recent Labs  Lab 04/25/22 1620  INR 1.3*   Thyroid Function Tests: Recent Labs    05/01/22 0823  TSH 1.957   Lipid Profile: Recent Labs    05/01/22 [id number]  CHOL 68  HDL 18*  LDLCALC 37  TRIG 64  CHOLHDL 3.8   Anemia Panel: Recent Labs    05/01/22 0823  VITAMINB12 964*  FOLATE 7.1  FERRITIN 336  TIBC 153*  IRON 41*  RETICCTPCT 1.3   Urine analysis:    Component Value Date/Time   COLORURINE AMBER (A) 04/23/2022 1554   APPEARANCEUR CLOUDY (A) 04/23/2022 1554   APPEARANCEUR Clear 07/08/2013 1606   LABSPEC 1.014 04/23/2022 1554   LABSPEC 1.010 07/08/2013 1606   PHURINE 5.0 04/23/2022 1554   GLUCOSEU NEGATIVE 04/23/2022 1554   GLUCOSEU Negative 07/08/2013 1606   HGBUR MODERATE (A) 04/23/2022 1554   BILIRUBINUR NEGATIVE 04/23/2022 1554   BILIRUBINUR Negative 07/08/2013 1606   KETONESUR NEGATIVE 04/23/2022 1554   PROTEINUR 100 (A) 04/23/2022 1554   UROBILINOGEN 0.2 02/22/2014 1853   NITRITE NEGATIVE 04/23/2022 1554   LEUKOCYTESUR LARGE (A) 04/23/2022 1554   LEUKOCYTESUR 3+ 07/08/2013 1606   Sepsis Labs: Invalid input(s): "PROCALCITONIN", "LACTICIDVEN"  Microbiology: Recent Results (from the past 240 hour(s))  Urine Culture (for pregnant, neutropenic or urologic patients or patients with an indwelling urinary catheter)     Status: Abnormal   Collection Time: 04/23/22  3:53 PM   Specimen: Urine, Clean Catch  Result Value Ref Range Status   Specimen Description   Final    URINE, CLEAN CATCH Performed at [facility], [address] [place], [place] [address]    Special Requests   Final    NONE Performed at [facility], [address] [place], [place] [address]    Culture >=100,000 COLONIES/mL ESCHERICHIA  COLI (A)  Final   Report Status 04/26/2022 FINAL  Final   Organism ID, Bacteria ESCHERICHIA COLI (A)  Final      Susceptibility   Escherichia coli - MIC*    AMPICILLIN 8 SENSITIVE Sensitive     CEFAZOLIN <=4 SENSITIVE Sensitive     CEFEPIME <=0.12 SENSITIVE Sensitive     CEFTRIAXONE <=0.25 SENSITIVE Sensitive     CIPROFLOXACIN <=0.25 SENSITIVE Sensitive     GENTAMICIN <=1 SENSITIVE Sensitive     IMIPENEM <=0.25 SENSITIVE Sensitive     NITROFURANTOIN <=16 SENSITIVE Sensitive     TRIMETH/SULFA <=20 SENSITIVE Sensitive     AMPICILLIN/SULBACTAM 4 SENSITIVE Sensitive     PIP/TAZO <=4 SENSITIVE Sensitive     * >=100,000 COLONIES/mL ESCHERICHIA COLI  Culture, blood (routine x 2)     Status: Abnormal   Collection Time: 04/23/22  3:54 PM   Specimen: BLOOD  Result Value Ref Range Status   Specimen Description   Final    BLOOD BLOOD RIGHT ARM Performed at [facility], [address] [place], [place] [address]    Special Requests   Final    BOTTLES DRAWN AEROBIC AND ANAEROBIC Blood Culture adequate volume Performed at [facility], [address] [place], [place] [address]    Culture  Setup Time   Final    GRAM NEGATIVE RODS IN BOTH AEROBIC AND ANAEROBIC BOTTLES CRITICAL RESULT CALLED TO, READ BACK BY AND VERIFIED WITHPamelia Hoit [facility] [id number] 04/24/22 HNM Performed at [facility], [address] [place], [place] [address]    Culture ESCHERICHIA COLI (A)  Final   Report Status 04/26/2022 FINAL  Final   Organism ID, Bacteria ESCHERICHIA COLI  Final      Susceptibility   Escherichia coli - MIC*    AMPICILLIN 8 SENSITIVE Sensitive     CEFEPIME <=0.12 SENSITIVE Sensitive  CEFTAZIDIME <=1 SENSITIVE Sensitive     CEFTRIAXONE <=0.25 SENSITIVE Sensitive     CIPROFLOXACIN <=0.25 SENSITIVE Sensitive     GENTAMICIN <=1 SENSITIVE Sensitive     IMIPENEM <=0.25 SENSITIVE Sensitive     TRIMETH/SULFA <=20 SENSITIVE Sensitive     AMPICILLIN/SULBACTAM <=2  SENSITIVE Sensitive     PIP/TAZO <=4 SENSITIVE Sensitive     * ESCHERICHIA COLI  Culture, blood (routine x 2)     Status: Abnormal   Collection Time: 04/23/22  3:54 PM   Specimen: BLOOD  Result Value Ref Range Status   Specimen Description   Final    BLOOD BLOOD LEFT ARM Performed at [facility], [address] [place], [place] [address]    Special Requests   Final    BOTTLES DRAWN AEROBIC AND ANAEROBIC Blood Culture adequate volume Performed at [facility], [address] [place], [place] [address]    Culture  Setup Time   Final    GRAM NEGATIVE RODS IN BOTH AEROBIC AND ANAEROBIC BOTTLES CRITICAL VALUE NOTED.  VALUE IS CONSISTENT WITH PREVIOUSLY REPORTED AND CALLED VALUE. Performed at [facility], [address] [place], [place] [address]    Culture (A)  Final    ESCHERICHIA COLI SUSCEPTIBILITIES PERFORMED ON PREVIOUS CULTURE WITHIN THE LAST 5 DAYS. Performed at [facility], [place] [address], [place], [place] [address]    Report Status 04/26/2022 FINAL  Final  Blood Culture ID Panel (Reflexed)     Status: Abnormal   Collection Time: 04/23/22  3:54 PM  Result Value Ref Range Status   Enterococcus faecalis NOT DETECTED NOT DETECTED Final   Enterococcus Faecium NOT DETECTED NOT DETECTED Final   Listeria monocytogenes NOT DETECTED NOT DETECTED Final   Staphylococcus species NOT DETECTED NOT DETECTED Final   Staphylococcus aureus (BCID) NOT DETECTED NOT DETECTED Final   Staphylococcus epidermidis NOT DETECTED NOT DETECTED Final   Staphylococcus lugdunensis NOT DETECTED NOT DETECTED Final   Streptococcus species NOT DETECTED NOT DETECTED Final   Streptococcus agalactiae NOT DETECTED NOT DETECTED Final   Streptococcus pneumoniae NOT DETECTED NOT DETECTED Final   Streptococcus pyogenes NOT DETECTED NOT DETECTED Final   A.calcoaceticus-baumannii NOT DETECTED NOT DETECTED Final   Bacteroides fragilis NOT DETECTED NOT DETECTED Final    Enterobacterales DETECTED (A) NOT DETECTED Corrected    Comment: Enterobacterales represent a large order of gram negative bacteria, not a single organism. CRITICAL RESULT CALLED TO, READ BACK BY AND VERIFIED WITH: Pamelia Hoit PHARMD [id number] 04/24/22 HNM CORRECTED ON 02/27 AT [id number]: PREVIOUSLY REPORTED AS DETECTED Enterobacterales represent a large order of gram negative bacteria, not a single organism. CRITICAL RESULT CALLED TO, READ BACK BY AND VERIFIED WITH: MORGAN GOBBLE PHARMD 04/24/22 HNM    Enterobacter cloacae complex NOT DETECTED NOT DETECTED Final   Escherichia coli DETECTED (A) NOT DETECTED Corrected    Comment: CRITICAL RESULT CALLED TO, READ BACK BY AND VERIFIED WITH: Pamelia Hoit PHARMD [id number] 04/24/22 HNM CORRECTED ON 02/27 AT [id number]: PREVIOUSLY REPORTED AS DETECTED CRITICAL RESULT CALLED TO, READ BACK BY AND VERIFIED WITH: MORGAN GOBBLE PHARMD 04/24/22 HNM    Klebsiella aerogenes NOT DETECTED NOT DETECTED Final   Klebsiella oxytoca NOT DETECTED NOT DETECTED Final   Klebsiella pneumoniae NOT DETECTED NOT DETECTED Final   Proteus species NOT DETECTED NOT DETECTED Final   Salmonella species NOT DETECTED NOT DETECTED Final   Serratia marcescens NOT DETECTED NOT DETECTED Final   Haemophilus influenzae NOT DETECTED NOT DETECTED Final   Neisseria  meningitidis NOT DETECTED NOT DETECTED Final   Pseudomonas aeruginosa NOT DETECTED NOT DETECTED Final   Stenotrophomonas maltophilia NOT DETECTED NOT DETECTED Final   Candida albicans NOT DETECTED NOT DETECTED Final   Candida auris NOT DETECTED NOT DETECTED Final   Candida glabrata NOT DETECTED NOT DETECTED Final   Candida krusei NOT DETECTED NOT DETECTED Final   Candida parapsilosis NOT DETECTED NOT DETECTED Final   Candida tropicalis NOT DETECTED NOT DETECTED Final   Cryptococcus neoformans/gattii NOT DETECTED NOT DETECTED Final   CTX-M ESBL NOT DETECTED NOT DETECTED Final   Carbapenem resistance IMP NOT DETECTED NOT DETECTED Final    Carbapenem resistance KPC NOT DETECTED NOT DETECTED Final   Carbapenem resistance NDM NOT DETECTED NOT DETECTED Final   Carbapenem resist OXA 48 LIKE NOT DETECTED NOT DETECTED Final   Carbapenem resistance VIM NOT DETECTED NOT DETECTED Final    Comment: Performed at [facility], [address], [place], [place] [address]  Urine Culture (for pregnant, neutropenic or urologic patients or patients with an indwelling urinary catheter)     Status: None   Collection Time: 04/24/22  8:23 AM   Specimen: Urine, Clean Catch  Result Value Ref Range Status   Specimen Description   Final    URINE, CLEAN CATCH Performed at [facility], [address], [place], [place] [address]    Special Requests   Final    NONE Performed at [facility], [address], [place], [place] [address]    Culture   Final    NO GROWTH Performed at [facility], [address], [place], [place] [address]    Report Status 04/26/2022 FINAL  Final  Culture, blood (Routine X 2) w Reflex to ID Panel     Status: None   Collection Time: 04/25/22  6:18 AM   Specimen: BLOOD RIGHT ARM  Result Value Ref Range Status   Specimen Description BLOOD RIGHT ARM  Final   Special Requests IN PEDIATRIC BOTTLE Blood Culture adequate volume  Final   Culture   Final    NO GROWTH 5 DAYS Performed at [facility], [address] [place], [place] [address]    Report Status 04/30/2022 FINAL  Final  Culture, blood (Routine X 2) w Reflex to ID Panel     Status: None   Collection Time: 04/25/22  6:18 AM   Specimen: BLOOD  Result Value Ref Range Status   Specimen Description BLOOD RIGHT ARM  Final   Special Requests   Final    BOTTLES DRAWN AEROBIC ONLY Blood Culture adequate volume   Culture   Final    NO GROWTH 5 DAYS Performed at [facility], [address], [place], [place] [address]    Report Status 04/30/2022 FINAL  Final  MRSA Next Gen by PCR, Nasal     Status: None    Collection Time: 04/25/22  2:39 PM   Specimen: Nasal Mucosa; Nasal Swab  Result Value Ref Range Status   MRSA by PCR Next Gen NOT DETECTED NOT DETECTED Final    Comment: (NOTE) The GeneXpert MRSA Assay (FDA approved for NASAL specimens only), is one component of a comprehensive MRSA colonization surveillance program. It is not intended to diagnose MRSA infection nor to guide or monitor treatment for MRSA infections. Test performance is not FDA approved in patients less than 36 years old. Performed at [facility], [address] [place], [place] [address]     Radiology Studies: No  results found.    Akif Weldy T. [place]  If 7PM-7AM, please contact night-coverage [email or web address] 05/01/2022, 10:00 AM

## 2022-05-01 NOTE — NC FL2 (Signed)
[place] LEVEL OF CARE FORM     IDENTIFICATION  Patient Name: Oscar Elliott Birthdate: 29-May-1960 Sex: male Admission Date (Current Location): 04/25/2022  [facility] and [place] Number:  Manufacturing engineer and Address:  [facility] [place], [place] [address] [place], [place] [address]      Provider Number: [id number]  Attending Physician Name and Address:  Mercy Riding, MD  Relative Name and Phone Number:       Current Level of Care: Hospital Recommended Level of Care: [place] Prior Approval Number:    Date Approved/Denied:   PASRR Number: Pending  Discharge Plan: SNF    Current Diagnoses: Patient Active Problem List   Diagnosis Date Noted   Oropharyngeal dysphagia 04/30/2022   Normocytic anemia 04/30/2022   Thrombocytopenia ([place]) 04/30/2022   Physical deconditioning 04/30/2022   Altered mental status 04/25/2022   AKI (acute kidney injury) ([place]) 123XX123   Acute metabolic encephalopathy 123XX123   History of seizure disorder 04/25/2022   Sepsis ([place]) 04/23/2022   Constipation 06/16/2019   Polypharmacy 03/21/2018   Hypoglycemia 05/30/2016   Insulin dependent type 2 diabetes mellitus ([place]) 10/25/2015   Encounter for screening colonoscopy 04/21/2014   Cholelithiases 04/21/2014   Hemorrhoids 04/21/2014   Elevated troponin 02/24/2014   Nausea and vomiting 02/23/2014   Abdominal pain    C. difficile colitis    Hypokalemia    Vomiting and diarrhea 02/22/2014   CAD -S/P RCA DES Sept 2015 12/02/2013   Hyperlipidemia 12/02/2013   Tobacco abuse    Peripheral vascular disease (HCC)    Schizophrenia (HCC)    Essential hypertension     Orientation RESPIRATION BLADDER Height & Weight     Self  Normal Incontinent Weight: 130 lb 11.7 oz (59.3 kg) Height:     BEHAVIORAL SYMPTOMS/MOOD NEUROLOGICAL BOWEL NUTRITION STATUS    Convulsions/Seizures Incontinent Diet (see DC summary)  AMBULATORY STATUS COMMUNICATION OF  NEEDS Skin   Extensive Assist Verbally Other (Comment) (non pressure wound, right thigh, no dressing)                       Personal Care Assistance Level of Assistance  Bathing, Feeding, Dressing Bathing Assistance: Maximum assistance Feeding assistance: Limited assistance Dressing Assistance: Maximum assistance     Functional Limitations Info  Sight, Speech Sight Info: Impaired   Speech Info: Impaired    SPECIAL CARE FACTORS FREQUENCY  PT (By licensed PT), OT (By licensed OT)     PT Frequency: 5x/wk OT Frequency: 5x/wk            Contractures Contractures Info: Not present    Additional Factors Info  Code Status, Allergies, Psychotropic Code Status Info: Full Allergies Info: Penicillins Psychotropic Info: Clozaril '100mg'$  in the morning and '500mg'$  at night         Current Medications (05/01/2022):  This is the current hospital active medication list Current Facility-Administered Medications  Medication Dose Route Frequency Provider Last Rate Last Admin   0.9 %  sodium chloride infusion   Intravenous PRN Freddi Starr, MD   Stopped at 04/29/22 1619   cefTRIAXone (ROCEPHIN) 2 g in sodium chloride 0.9 % 100 mL IVPB  2 g Intravenous Q24H [place] Crane M, PA-C   Stopped at 04/30/22 1521   Chlorhexidine Gluconate Cloth 2 % PADS 6 each  6 each Topical Daily Jacky Kindle, MD   6 each at 05/01/22 0802   cloZAPine (CLOZARIL) tablet 100 mg  100  mg Oral Daily Jacky Kindle, MD   100 mg at 05/01/22 0801   cloZAPine (CLOZARIL) tablet 500 mg  500 mg Oral QHS Nevada Crane M, PA-C   500 mg at 04/30/22 2257   docusate sodium (COLACE) capsule 100 mg  100 mg Oral BID PRN Jacky Kindle, MD       haloperidol lactate (HALDOL) injection 2 mg  2 mg Intravenous Q6H PRN Gonfa, Taye T, MD       insulin aspart (novoLOG) injection 0-9 Units  0-9 Units Subcutaneous Q4H Jacky Kindle, MD   1 Units at 05/01/22 0802   ondansetron (ZOFRAN) injection 4 mg  4 mg Intravenous Q6H PRN Jacky Kindle, MD   4 mg at 04/30/22 1334   Oral care mouth rinse  15 mL Mouth Rinse PRN Ogan, Kerry Kass, MD       polyethylene glycol (MIRALAX / GLYCOLAX) packet 17 g  17 g Oral Daily PRN Jacky Kindle, MD       potassium PHOSPHATE 30 mmol in dextrose 5 % 500 mL infusion  30 mmol Intravenous Once Wendee Beavers T, MD       thiamine (VITAMIN B1) tablet 100 mg  100 mg Oral Daily Dimple Nanas, RPH   100 mg at 05/01/22 [id number]   Or   thiamine (VITAMIN B1) injection 100 mg  100 mg Intravenous Daily Dimple Nanas, RPH   100 mg at 04/29/22 1004   topiramate (TOPAMAX) tablet 12.5 mg  12.5 mg Oral BID Derek Jack, MD   12.5 mg at 05/01/22 0801     Discharge Medications: Please see discharge summary for a list of discharge medications.  Relevant Imaging Results:  Relevant Lab Results:   Additional Information SS#: [id number]  Geralynn Ochs, LCSW

## 2022-05-01 NOTE — Care Management Important Message (Signed)
Important Message  Patient Details  Name: Oscar Elliott MRN: [id number] Date of Birth: Oct 06, 1960   Medicare Important Message Given:  Yes     [name] 05/01/2022, 2:33 PM

## 2022-05-01 NOTE — Progress Notes (Signed)
Name: Oscar Elliott DOB: 1960/04/25  Please be advised that the above-named patient will require a short-term nursing home stay -- anticipated 30 days or less for rehabilitation and strengthening. The plan is for return home.

## 2022-05-01 NOTE — TOC Initial Note (Addendum)
Transition of Care [facility]) - Initial/Assessment Note    Patient Details  Name: Oscar Elliott MRN: [id number] Date of Birth: November 12, 1960  Transition of Care [facility]) CM/SW Contact:    Geralynn Ochs, LCSW Phone Number: 05/01/2022, 12:49 PM  Clinical Narrative:           CSW spoke with group home owner, [name], to discuss recommendation for SNF. Patient has no guardian or family, but group home owner is willing to act as next of kin for decision making. Lonnie agreeable to SNF, no preference just close to the group home. CSW faxed out referral, will follow with bed offers.       UPDATE: CSW spoke with Lonnie to provide bed offers, and Lonnie chose [facility]. Lonnie expressed some concern that patient's psych meds were off, and CSW passed concern on to MD. CSW to follow.   Expected Discharge Plan: Skilled Nursing Facility Barriers to Discharge: Continued Medical Work up, Ship broker   Patient Goals and CMS Choice Patient states their goals for this hospitalization and ongoing recovery are:: patient unable to participate in goal setting, not fully oriented CMS Medicare.gov Compare Post Acute Care list provided to:: Patient Represenative (must comment) Choice offered to / list presented to :  (group home owner) [place] ownership interest in [facility]provided to::  (group home owner)    Expected Discharge Plan and [place] Choice: [place] Living arrangements for the past 2 months: Group Home                                      Prior Living Arrangements/Services Living arrangements for the past 2 months: Group Home Lives with:: Facility Resident Patient language and need for interpreter reviewed:: No Do you feel safe going back to the place where you live?: Yes      Need for Family Participation in Patient Care: Yes (Comment) Care giver support system in place?: Yes (comment) Current home services:  DME Criminal Activity/Legal Involvement Pertinent to Current Situation/Hospitalization: No - Comment as needed  Activities of Daily Living      Permission Sought/Granted Permission sought to share information with : Chartered certified accountant granted to share information with : Yes, Verbal Permission Granted  Share Information with NAME: [name]  Permission granted to share info w AGENCY: Group home, SNF  Permission granted to share info w Relationship: Group home owner     Emotional Assessment Appearance:: Appears stated age Attitude/Demeanor/Rapport: Unable to Assess Affect (typically observed): Unable to Assess Orientation: : Oriented to Self Alcohol / Substance Use: Not Applicable Psych Involvement: No (comment)  Admission diagnosis:  Seizure [facility]) [R56.9] Patient Active Problem List   Diagnosis Date Noted   Oropharyngeal dysphagia 04/30/2022   Normocytic anemia 04/30/2022   Thrombocytopenia ([place]) 04/30/2022   Physical deconditioning 04/30/2022   Altered mental status 04/25/2022   AKI (acute kidney injury) ([place]) 123XX123   Acute metabolic encephalopathy 123XX123   History of seizure disorder 04/25/2022   Sepsis ([place]) 04/23/2022   Constipation 06/16/2019   Polypharmacy 03/21/2018   Hypoglycemia 05/30/2016   Insulin dependent type 2 diabetes mellitus ([place]) 10/25/2015   Encounter for screening colonoscopy 04/21/2014   Cholelithiases 04/21/2014   Hemorrhoids 04/21/2014   Elevated troponin 02/24/2014   Nausea and vomiting 02/23/2014   Abdominal pain    C. difficile colitis    Hypokalemia  Vomiting and diarrhea 02/22/2014   CAD -S/P RCA DES Sept 2015 12/02/2013   Hyperlipidemia 12/02/2013   Tobacco abuse    Peripheral vascular disease ([place])    Schizophrenia ([place])    Essential hypertension    PCP:  Lauretta Grill, NP Pharmacy:   [facility], [place] [address] [place] [address] Phone: [phone] Fax:  [phone]     Social Determinants of Health (SDOH) Social History: SDOH Screenings   Tobacco Use: High Risk (04/28/2022)   SDOH Interventions:     Readmission Risk Interventions     No data to display

## 2022-05-02 LAB — RENAL FUNCTION PANEL
Albumin: 1.8 g/dL — ABNORMAL LOW (ref 3.5–5.0)
Anion gap: 5 (ref 5–15)
BUN: 9 mg/dL (ref 8–23)
CO2: 24 mmol/L (ref 22–32)
Calcium: 7.9 mg/dL — ABNORMAL LOW (ref 8.9–10.3)
Chloride: 117 mmol/L — ABNORMAL HIGH (ref 98–111)
Creatinine, Ser: 1.63 mg/dL — ABNORMAL HIGH (ref 0.61–1.24)
GFR, Estimated: 47 mL/min — ABNORMAL LOW (ref >60)
Glucose, Bld: 96 mg/dL (ref 70–99)
Phosphorus: 2.8 mg/dL (ref 2.5–4.6)
Potassium: 3.7 mmol/L (ref 3.5–5.1)
Sodium: 146 mmol/L — ABNORMAL HIGH (ref 135–145)

## 2022-05-02 LAB — CBC
HCT: 21.6 % — ABNORMAL LOW (ref 39.0–52.0)
Hemoglobin: 7.4 g/dL — ABNORMAL LOW (ref 13.0–17.0)
MCH: 29.4 pg (ref 26.0–34.0)
MCHC: 34.3 g/dL (ref 30.0–36.0)
MCV: 85.7 fL (ref 80.0–100.0)
Platelets: 157 10*3/uL (ref 150–400)
RBC: 2.52 MIL/uL — ABNORMAL LOW (ref 4.22–5.81)
RDW: 17.2 % — ABNORMAL HIGH (ref 11.5–15.5)
WBC: 8.6 10*3/uL (ref 4.0–10.5)
nRBC: 0.3 % — ABNORMAL HIGH (ref 0.0–0.2)

## 2022-05-02 LAB — GLUCOSE, CAPILLARY
Glucose-Capillary: 117 mg/dL — ABNORMAL HIGH (ref 70–99)
Glucose-Capillary: 123 mg/dL — ABNORMAL HIGH (ref 70–99)
Glucose-Capillary: 126 mg/dL — ABNORMAL HIGH (ref 70–99)
Glucose-Capillary: 156 mg/dL — ABNORMAL HIGH (ref 70–99)
Glucose-Capillary: 93 mg/dL (ref 70–99)
Glucose-Capillary: 99 mg/dL (ref 70–99)

## 2022-05-02 LAB — MAGNESIUM: Magnesium: 1.8 mg/dL (ref 1.7–2.4)

## 2022-05-02 MED ORDER — IBUPROFEN 200 MG PO TABS: 600.0000 mg | ORAL_TABLET | Freq: Four times a day (QID) | ORAL | Status: DC | PRN

## 2022-05-02 MED ORDER — ACETAMINOPHEN 325 MG PO TABS
325.0000 mg | ORAL_TABLET | Freq: Four times a day (QID) | ORAL | Status: DC | PRN
  Administered 2022-05-02 (×2): 325 mg via ORAL
  Filled 2022-05-02 (×2): qty 1

## 2022-05-02 MED ORDER — ADULT MULTIVITAMIN W/MINERALS CH
1.0000 | ORAL_TABLET | Freq: Every day | ORAL | Status: DC
  Administered 2022-05-02 – 2022-05-03 (×2): 1 via ORAL
  Filled 2022-05-02 (×2): qty 1

## 2022-05-02 MED ORDER — ENSURE ENLIVE PO LIQD
237.0000 mL | Freq: Two times a day (BID) | ORAL | Status: DC
  Administered 2022-05-02 – 2022-05-03 (×2): 237 mL via ORAL

## 2022-05-02 NOTE — Progress Notes (Addendum)
Pharmacy - Clozapine     This patient's order has been reviewed for prescribing contraindications.   Clozapine REMS enrollment Verified: yes Current Outpatient Monitoring: monthly  Home Regimen: Clozapine '100mg'$  in AM, '500mg'$  in PM  Labs:       Latest Ref Rng & Units 05/02/2022    7:43 AM 05/01/2022    8:23 AM 04/30/2022    9:56 AM  CBC  WBC 4.0 - 10.5 K/uL 8.6  8.5  9.9   Hemoglobin 13.0 - 17.0 g/dL 7.4  7.7  8.0   Hematocrit 39.0 - 52.0 % 21.6  22.2  23.5   Platelets 150 - 400 K/uL 157  120  105    02/13/22 ANC 4600 05/02/22 ANC 6600  The medication is being dispensed pursuant to the FDA REMS suspension order of 01/15/20 that allows for dispensing without a patient REMS dispense authorization (RDA).   Plan: Continue with home regimen of 100 mg AM and 500 mg HS Continue with weekly ANC labs while inpatient  Thank you for allowing pharmacy to be a part of this patient's care.  Ardyth Harps, PharmD Clinical Pharmacist

## 2022-05-02 NOTE — Progress Notes (Signed)
Physical Therapy Treatment Patient Details Name: Oscar Elliott MRN: [id number] DOB: [birth year] Today's Date: 05/02/2022   History of Present Illness The pt is a 62 yo male presenting to [facility] 2/26 with headache and tremors, is alert but not oriented. Work up showed UTI, EEG on 2/28 showed 2 seizures, and transferred to [facility] for continuous EEG. 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  SDH stable on CT head on 3/2 and 3/3. Repeat LTM EEG on 3/4 within normal limits. PMHx of HTN, HLD, CAD c/b STEMI (s/p DES to RCA 10/2013), PVD (s/p L AKA), T2DM, CKD, schizophrenia    PT Comments    Pt was able to progress to performing transfers today, but has impulsive tendencies to scoot/shift his weight anteriorly excessively with transfers, placing him at high risk for falls. Pt needed maxA to ensure safety with stand pivot transfers but modA with squat pivot or lateral scoot transfers. Will continue to follow acutely. Current recommendations remain appropriate.    Recommendations for follow up therapy are one component of a multi-disciplinary discharge planning process, led by the attending physician.  Recommendations may be updated based on patient status, additional functional criteria and insurance authorization.  Follow Up Recommendations  Skilled nursing-short term rehab (<3 hours/day) Can patient physically be transported by private vehicle: No   Assistance Recommended at Discharge Frequent or constant Supervision/Assistance  Patient can return home with the following A lot of help with walking and/or transfers;A lot of help with bathing/dressing/bathroom;Assistance with cooking/housework;Assistance with feeding;Direct supervision/assist for financial management;Direct supervision/assist for medications management;Help with stairs or ramp for entrance;Assist for transportation   Equipment Recommendations  None  recommended by PT    Recommendations for Other Services       Precautions / Restrictions Precautions Precautions: Fall Precaution Comments: L AKA, no prosthetic in room Restrictions Weight Bearing Restrictions: No     Mobility  Bed Mobility Overal bed mobility: Needs Assistance Bed Mobility: Supine to Sit     Supine to sit: Min assist, HOB elevated     General bed mobility comments: MinA to ascend trunk and provide stability with transitioning supine > sit R EOB    Transfers Overall transfer level: Needs assistance Equipment used: None Transfers: Sit to/from Stand, Bed to chair/wheelchair/BSC Sit to Stand: Mod assist Stand pivot transfers: Max assist   Squat pivot transfers: Mod assist    Lateral/Scoot Transfers: Mod assist General transfer comment: Pt demonstrating good strength through his UEs to unweight his buttocks with transfers. Pt coming to stand at EOB with modA and UE support on bed but displayed a tendency to continue to impulsively shift his weight anteriorly, needing maxA to ensure pt reached the chair with stand pivot transfer from EOB. Cued pt to perform squat pivot instead back to bed then lateral scoot back to the chair with modA again to prevent pt scooting anteriorly off surfaces.    Ambulation/Gait               General Gait Details: deferred   Stairs             Wheelchair Mobility    Modified Rankin (Stroke Patients Only) Modified Rankin (Stroke Patients Only) Pre-Morbid Rankin Score: Moderately severe disability Modified Rankin: Severe disability     Balance Overall balance assessment: Needs assistance Sitting-balance support: Bilateral upper extremity supported, Feet unsupported Sitting balance-Leahy Scale: Poor Sitting balance - Comments: minG-minA to maintain static sitting  balance   Standing balance support: Single extremity supported, Bilateral upper extremity supported Standing balance-Leahy Scale: Poor Standing  balance comment: UE support and mod-maxA to stand                            Cognition Arousal/Alertness: Awake/alert Behavior During Therapy: Impulsive Overall Cognitive Status: History of cognitive impairments - at baseline                                 General Comments: Pt perseverating on his birthday and stating "yeah" to most questions. Impulsive to scoot or transition weight anteriorly with transfers needing cues or assistance to maintain his safety        Exercises      General Comments        Pertinent Vitals/Pain Pain Assessment Pain Assessment: Faces Faces Pain Scale: No hurt Pain Intervention(s): Monitored during session    Home Living                          Prior Function            PT Goals (current goals can now be found in the care plan section) Acute Rehab PT Goals Patient Stated Goal: to get OOB and get warm PT Goal Formulation: With patient Time For Goal Achievement: 05/14/22 Potential to Achieve Goals: Fair Progress towards PT goals: Progressing toward goals    Frequency    Min 3X/week      PT Plan Current plan remains appropriate    Co-evaluation              AM-PAC PT "6 Clicks" Mobility   Outcome Measure  Help needed turning from your back to your side while in a flat bed without using bedrails?: A Little Help needed moving from lying on your back to sitting on the side of a flat bed without using bedrails?: A Little Help needed moving to and from a bed to a chair (including a wheelchair)?: A Lot Help needed standing up from a chair using your arms (e.g., wheelchair or bedside chair)?: A Lot Help needed to walk in hospital room?: Total Help needed climbing 3-5 steps with a railing? : Total 6 Click Score: 12    End of Session   Activity Tolerance: Patient tolerated treatment well Patient left: with call bell/phone within reach;in chair;with chair alarm set (posey belt on pt but not  secured to bed upon arrival and do not see active order for it, thus removed from pt's body) Nurse Communication: Mobility status (NT) PT Visit Diagnosis: Other abnormalities of gait and mobility (R26.89);Muscle weakness (generalized) (M62.81);Unsteadiness on feet (R26.81)     Time: [id number] PT Time Calculation (min) (ACUTE ONLY): 17 min  Charges:  $Therapeutic Activity: 8-22 mins                     Moishe Spice, PT, DPT Acute Rehabilitation Services  Office: [phone]    Orvan Falconer 05/02/2022, 2:28 PM

## 2022-05-02 NOTE — Progress Notes (Addendum)
Initial Nutrition Assessment  DOCUMENTATION CODES:  Non-severe (moderate) malnutrition in context of social or environmental circumstances  INTERVENTION:  Continue current diet as ordered Ensure Enlive po BID, each supplement provides 350 kcal and 20 grams of protein. Magic cup TID with meals, each supplement provides 290 kcal and 9 grams of protein MVI with minerals daily  NUTRITION DIAGNOSIS:  Moderate Malnutrition related to social / environmental circumstances as evidenced by moderate muscle depletion, severe muscle depletion.  GOAL:  Patient will meet greater than or equal to 90% of their needs  MONITOR:  PO intake, Supplement acceptance, Weight trends  REASON FOR ASSESSMENT:  Consult Poor PO  ASSESSMENT:  Pt with hx of CAD, HTN, HLD, MI, PVD, schizophrenia, and DM type 2 presented to [facility] ED from group home with tremors. Transferred to [facility] for continuous EEG monitoring.  2/26 - Presented to [facility] ED 2/28 - Transferred to [place]  Pt resting in bedside chair at the time of visit. Pt awake and alert, but unable to provide a reliable nutrition hx at this time. Pt reports that he has been eating good.  Discussed intake with RN. Reports that pt's appetite seems to taper off as the day goes on. Reports ate well at breakfast yesterday but by dinner was only eating a few bites. Did ask for a snack in the middle of the night. Pt does have muscle and fat deficits on exam suggestive of long term undernourishment. Will add nutrition supplements to augment intake.   Average Meal Intake: 2/29-3/5: 35% intake x 6 recorded meals  Nutritionally Relevant Medications: Scheduled Meds:  insulin aspart  0-9 Units Subcutaneous Q4H   thiamine  100 mg Oral Daily   Continuous Infusions:  cefTRIAXone (ROCEPHIN)  IV 2 g (05/01/22 1235)   PRN Meds: docusate sodium, ondansetron, polyethylene glycol  Labs Reviewed: Na 146, chloride 117 Creatinine 1.63 CBG ranges from 93-128 mg/dL  over the last 24 hours   NUTRITION - FOCUSED PHYSICAL EXAM: Flowsheet Row Most Recent Value  Orbital Region Mild depletion  Upper Arm Region Moderate depletion  Thoracic and Lumbar Region Moderate depletion  Buccal Region Mild depletion  Temple Region Moderate depletion  Clavicle Bone Region Mild depletion  Clavicle and Acromion Bone Region Mild depletion  Scapular Bone Region Mild depletion  Dorsal Hand Mild depletion  Patellar Region Severe depletion  Anterior Thigh Region Severe depletion  Posterior Calf Region Severe depletion  Edema (RD Assessment) None  Hair Reviewed  Eyes Reviewed  Mouth Reviewed  Skin Reviewed  Nails Reviewed   Diet Order:   Diet Order             DIET DYS 3 Room service appropriate? Yes; Fluid consistency: Thin  Diet effective now                   EDUCATION NEEDS:  Not appropriate for education at this time  Skin:  Skin Assessment: Reviewed RN Assessment (wound to the right distal thigh)  Last BM:  3/1 - type 2  Height:  Ht Readings from Last 1 Encounters:  04/23/22 '5\' 1"'$  (1.549 m)    Weight:  Wt Readings from Last 1 Encounters:  05/02/22 58 kg    Ideal Body Weight:  50.9 kg  BMI:  Body mass index is 24.16 kg/m.  Estimated Nutritional Needs:  Kcal:  1500-1800 kcal/d Protein:  75-90g/d Fluid:  >/=1.5L/d    Ranell Patrick, RD, LDN Clinical Dietitian RD pager # available in [facility]  After hours/weekend  pager # available in [facility]

## 2022-05-02 NOTE — Progress Notes (Signed)
PROGRESS NOTE    Oscar Elliott  [id number] DOB: [birth year] DOA: 04/25/2022 PCP: Lauretta Grill, NP   Brief Narrative:  Oscar Elliott is a 62 y.o. 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 CXR and CT head without acute finding. Renal US with stable nonobstructing 81m L renal calculus.Cultures obtained.  Started on broad-spectrum antibiotics and admitted.     Patient was noted to have E. coli bacteremia and E. coli UTI.antibiotic de-escalated to IV CTX.  Patient remained altered despite appropriate treatment of sepsis, he was having jerking movements, neurology was consulted, patient was loaded with valproic acid, then was started on 500 mg twice daily, [facility].  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  SDH stable on CT head on 3/2 and 3/3.   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  Antiepileptics changed to topiramate (see neuronote for rationale).  Repeat LTM EEG on 3/4 within normal limits.  Therapy recommended SNF.   Assessment & Plan:   Active Problems:   Peripheral vascular disease (HCC)   Schizophrenia (HCC)   Essential hypertension   CAD -S/P RCA DES Sept 2015   Insulin dependent type 2 diabetes mellitus (HCC)   AKI (acute kidney injury) ([place]   Acute metabolic encephalopathy   Oropharyngeal dysphagia   Normocytic anemia   Thrombocytopenia (HCC)   Physical deconditioning   Seizures disorder/seizure-like activity:  -Initial CT head without acute finding.   -MRI on 3/1 with 6 mm SDH which is a stable on subsequent CT.   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    -Repeat LTM EEG on 3/4 within normal limits.   -Antiepileptics changed to topiramate (see neuro note for rationale).   -Seizure precaution -Outpatient follow-up with neurology   Severe sepsis due to E. coli bacteremia and UTI: POA.   -Tachycardia, tachypnea, AKI and lactic acidosis on presentation.  -Blood and urine cultures with pansensitive E. coli.  Repeat blood cultures NGTD. -Complete ceftriaxone [id number]  Acute metabolic encephalopathy, resolving:  -Could be due to sepsis and possibly postictal.  Seems to be improving.   -He is awake and alert but only oriented to self.  Follows commands. -Continue IV thiamine -IV Haldol as needed.  Monitor EKG and optimize electrolytes -Minimize sedating medications -Reorientation and delirium precautions.   Schizophrenia with paranoia and visual hallucination: Reports seeing an aWeb designer  Otherwise stable. -Appreciate psychiatry recs on 3/2             -Clozaril 100 mg PO Qam and 500 mg PO QHS  -Depakote 500 mg PO Q12.  Discontinued by neurology due to subdural hematoma -PRN Ativan and Haldol for agitation.  Patient did not require those.  Discontinued Ativan.  Decreased Haldol. -Restart trazodone 100 mg PO QHS if patient is having difficulty sleeping through the night.  Did not require.   Oropharyngeal dysphagia:  -Continue dysphagia 3 diet per SLP   History of CAD with STEMI s/p DES 2015 PVD s/p L AKA -Off antiplatelets due to SDH. -Continue statin   Subdural hematoma: MRI on 3/1 showed 6 mm SDH with no mass effect.  Stable on subsequent CTs. -Continue monitoring clinically   AKI  in the setting of sepsis: Resolving.  Controlled IDDM-2 with hyperglycemia: A1c 6.7%.  Normocytic anemia: Slight drop in Hgb likely due to critical illness and hemodilution.  No obvious bleeding.  Anemia panel consistent with anemia of chronic disease.   Thrombocytopenia: Improving.   Hypernatremia: Resolved. -Continue hypotonic fluid    Hypokalemia/hypophosphatemia -IV potassium phosphate 30 mmol x 1   Physical deconditioning/left AKA -PT/OT-recommended SNF   Inadequate oral intake Body mass index is 24.7 kg/m. -Consult dietitian  DVT prophylaxis: SCDs Code Status: Full Family Communication: None present  Status is: Inpt  Dispo: The patient is from: Home              Anticipated d/c is to: SNF              Anticipated d/c date is: Imminent              Patient currently IS medically stable for discharge  Consultants:  Neuro, Psych, PCCM  Procedures:  None  Antimicrobials:  Completed   Subjective: No acute issues/events overnight  Objective: Vitals:   05/01/22 2006 05/01/22 2315 05/02/22 0322 05/02/22 0332  BP: (!) 148/85 114/71 125/78   Pulse: (!) 108 (!) 110 (!) 105   Resp: '19 20 19   '$ Temp: 99 F (37.2 C) 98 F (36.7 C) 98.8 F (37.1 C)   TempSrc: Oral Axillary Axillary   SpO2: 100% 100% 100%   Weight:    58 kg    Intake/Output Summary (Last 24 hours) at 05/02/2022 0729 Last data filed at 05/02/2022 0648 Gross per 24 hour  Intake 658.73 ml  Output 2100 ml  Net -1441.27 ml   Filed Weights   04/30/22 0800 05/01/22 0323 05/02/22 0332  Weight: 56.4 kg 59.3 kg 58 kg    Examination:  GENERAL: No apparent distress.  Nontoxic. HEENT: MMM.  Vision and hearing grossly intact.  NECK: Supple.  No apparent JVD.  RESP:  No IWOB.  Fair aeration bilaterally. CVS:  RRR. Heart sounds normal.  ABD/GI/GU: BS+. Abd soft, NTND.  MSK/EXT:   Left AKA.  Moves extremities.  Symmetric strength.  Trace edema in all extremities. SKIN: no apparent skin lesion or wound NEURO: Awake but not quite alert.  Oriented to self.  Follows commands.  No facial asymmetry.  PERRL.  No apparent focal neuro deficit. PSYCH: Calm. Normal affect.     Data Reviewed: I have personally reviewed following labs and imaging studies  CBC: Recent Labs  Lab 04/27/22 1116 04/28/22 0618 04/29/22 1230 04/30/22 0956  05/01/22 0823  WBC 6.3 8.6 10.0 9.9 8.5  NEUTROABS  --   --   --   --  6.7  HGB 8.0* 8.8* 8.5* 8.0* 7.7*  HCT 24.0* 25.9* 25.4* 23.5* 22.2*  MCV 87.0 87.2 87.0 86.1 84.4  PLT 72* 74* 87* 105* 123456*   Basic Metabolic Panel: Recent Labs  Lab 04/26/22 0358 04/27/22 1116 04/28/22 0618 04/29/22 1230 04/30/22 0956 05/01/22 0823  NA 152* 148* 145 146* 145 144  K 3.3* 3.1* 3.5 3.6 3.2* 3.5  CL 123* 119* 118* 120* 118* 116*  CO2 22 19* 21* 21* 24 23  GLUCOSE 94 159* 199* 138* 146* 134*  BUN 37* '22 18 14 11 9  '$ CREATININE 2.23* 1.87* 1.68* 1.47* 1.44* 1.42*  CALCIUM 7.9* 7.4* 7.6* 7.4* 7.3* 7.7*  MG 2.7* 2.3 2.1 2.0 1.9 1.7  PHOS 1.8* 2.1* 3.2  --   --  1.3*   GFR: Estimated Creatinine Clearance: 39.9 mL/min (A) (  by C-G formula based on SCr of 1.42 mg/dL (H)). Liver Function Tests: Recent Labs  Lab 04/26/22 0358 04/29/22 1230 04/30/22 0956 05/01/22 0823  AST 46* 33 25 28  ALT 33 '23 21 25  '$ ALKPHOS 63 61 66 72  BILITOT 1.0 0.6 0.6 0.6  PROT 5.3* 4.8* 5.1* 5.4*  ALBUMIN 2.0* 1.6* 1.7* 1.7*   No results for input(s): "LIPASE", "AMYLASE" in the last 168 hours. Recent Labs  Lab 04/26/22 0358  AMMONIA 31   Coagulation Profile: Recent Labs  Lab 04/25/22 1620  INR 1.3*   Cardiac Enzymes: Recent Labs  Lab 04/25/22 1538 04/30/22 0956  CKTOTAL 233 71   BNP (last 3 results) No results for input(s): "PROBNP" in the last 8760 hours. HbA1C: No results for input(s): "HGBA1C" in the last 72 hours. CBG: Recent Labs  Lab 05/01/22 1140 05/01/22 1526 05/01/22 1949 05/01/22 2322 05/02/22 0321  GLUCAP 124* 113* 107* 124* 99   Lipid Profile: Recent Labs    05/01/22 0823  CHOL 68  HDL 18*  LDLCALC 37  TRIG 64  CHOLHDL 3.8   Thyroid Function Tests: Recent Labs    05/01/22 0823  TSH 1.957   Anemia Panel: Recent Labs    05/01/22 0823  VITAMINB12 964*  FOLATE 7.1  FERRITIN 336  TIBC 153*  IRON 41*  RETICCTPCT 1.3   Sepsis Labs: No results for input(s):  "PROCALCITON", "LATICACIDVEN" in the last 168 hours.  Recent Results (from the past 240 hour(s))  Urine Culture (for pregnant, neutropenic or urologic patients or patients with an indwelling urinary catheter)     Status: Abnormal   Collection Time: 04/23/22  3:53 PM   Specimen: Urine, Clean Catch  Result Value Ref Range Status   Specimen Description   Final    URINE, CLEAN CATCH Performed at [facility], [address] [place], [place] [address]    Special Requests   Final    NONE Performed at [facility], [address] [place], [place] [address]    Culture >=100,000 COLONIES/mL ESCHERICHIA COLI (A)  Final   Report Status 04/26/2022 FINAL  Final   Organism ID, Bacteria ESCHERICHIA COLI (A)  Final      Susceptibility   Escherichia coli - MIC*    AMPICILLIN 8 SENSITIVE Sensitive     CEFAZOLIN <=4 SENSITIVE Sensitive     CEFEPIME <=0.12 SENSITIVE Sensitive     CEFTRIAXONE <=0.25 SENSITIVE Sensitive     CIPROFLOXACIN <=0.25 SENSITIVE Sensitive     GENTAMICIN <=1 SENSITIVE Sensitive     IMIPENEM <=0.25 SENSITIVE Sensitive     NITROFURANTOIN <=16 SENSITIVE Sensitive     TRIMETH/SULFA <=20 SENSITIVE Sensitive     AMPICILLIN/SULBACTAM 4 SENSITIVE Sensitive     PIP/TAZO <=4 SENSITIVE Sensitive     * >=100,000 COLONIES/mL ESCHERICHIA COLI  Culture, blood (routine x 2)     Status: Abnormal   Collection Time: 04/23/22  3:54 PM   Specimen: BLOOD  Result Value Ref Range Status   Specimen Description   Final    BLOOD BLOOD RIGHT ARM Performed at [facility], [address] [place], [place] [address]    Special Requests   Final    BOTTLES DRAWN AEROBIC AND ANAEROBIC Blood Culture adequate volume Performed at [facility], [address] [place], [place] [address]    Culture  Setup Time   Final    GRAM NEGATIVE RODS IN BOTH AEROBIC AND ANAEROBIC BOTTLES CRITICAL RESULT CALLED TO, READ BACK BY  AND VERIFIED WITHPamelia Hoit [facility] [id number]  04/24/22 HNM Performed at [facility], [address] [place], [place] [address]    Culture ESCHERICHIA COLI (A)  Final   Report Status 04/26/2022 FINAL  Final   Organism ID, Bacteria ESCHERICHIA COLI  Final      Susceptibility   Escherichia coli - MIC*    AMPICILLIN 8 SENSITIVE Sensitive     CEFEPIME <=0.12 SENSITIVE Sensitive     CEFTAZIDIME <=1 SENSITIVE Sensitive     CEFTRIAXONE <=0.25 SENSITIVE Sensitive     CIPROFLOXACIN <=0.25 SENSITIVE Sensitive     GENTAMICIN <=1 SENSITIVE Sensitive     IMIPENEM <=0.25 SENSITIVE Sensitive     TRIMETH/SULFA <=20 SENSITIVE Sensitive     AMPICILLIN/SULBACTAM <=2 SENSITIVE Sensitive     PIP/TAZO <=4 SENSITIVE Sensitive     * ESCHERICHIA COLI  Culture, blood (routine x 2)     Status: Abnormal   Collection Time: 04/23/22  3:54 PM   Specimen: BLOOD  Result Value Ref Range Status   Specimen Description   Final    BLOOD BLOOD LEFT ARM Performed at [facility], [address] [place], [place] [address]    Special Requests   Final    BOTTLES DRAWN AEROBIC AND ANAEROBIC Blood Culture adequate volume Performed at [facility], [address] [place], [place] [address]    Culture  Setup Time   Final    GRAM NEGATIVE RODS IN BOTH AEROBIC AND ANAEROBIC BOTTLES CRITICAL VALUE NOTED.  VALUE IS CONSISTENT WITH PREVIOUSLY REPORTED AND CALLED VALUE. Performed at [facility], [address] [place], [place] [address]    Culture (A)  Final    ESCHERICHIA COLI SUSCEPTIBILITIES PERFORMED ON PREVIOUS CULTURE WITHIN THE LAST 5 DAYS. Performed at [facility], [place] [address], [place], [place] [address]    Report Status 04/26/2022 FINAL  Final  Blood Culture ID Panel (Reflexed)     Status: Abnormal   Collection Time: 04/23/22  3:54 PM  Result Value Ref Range Status   Enterococcus faecalis NOT DETECTED NOT DETECTED Final   Enterococcus Faecium NOT DETECTED NOT DETECTED Final   Listeria monocytogenes NOT  DETECTED NOT DETECTED Final   Staphylococcus species NOT DETECTED NOT DETECTED Final   Staphylococcus aureus (BCID) NOT DETECTED NOT DETECTED Final   Staphylococcus epidermidis NOT DETECTED NOT DETECTED Final   Staphylococcus lugdunensis NOT DETECTED NOT DETECTED Final   Streptococcus species NOT DETECTED NOT DETECTED Final   Streptococcus agalactiae NOT DETECTED NOT DETECTED Final   Streptococcus pneumoniae NOT DETECTED NOT DETECTED Final   Streptococcus pyogenes NOT DETECTED NOT DETECTED Final   A.calcoaceticus-baumannii NOT DETECTED NOT DETECTED Final   Bacteroides fragilis NOT DETECTED NOT DETECTED Final   Enterobacterales DETECTED (A) NOT DETECTED Corrected    Comment: Enterobacterales represent a large order of gram negative bacteria, not a single organism. CRITICAL RESULT CALLED TO, READ BACK BY AND VERIFIED WITH: Pamelia Hoit PHARMD [id number] 04/24/22 HNM CORRECTED ON 02/27 AT [id number]: PREVIOUSLY REPORTED AS DETECTED Enterobacterales represent a large order of gram negative bacteria, not a single organism. CRITICAL RESULT CALLED TO, READ BACK BY AND VERIFIED WITH: MORGAN GOBBLE PHARMD 04/24/22 HNM    Enterobacter cloacae complex NOT DETECTED NOT DETECTED Final   Escherichia coli DETECTED (A) NOT DETECTED Corrected    Comment: CRITICAL RESULT CALLED TO, READ BACK BY AND VERIFIED WITHPamelia Hoit PHARMD [id number] 04/24/22 HNM CORRECTED ON 02/27 AT [id number]: PREVIOUSLY REPORTED AS DETECTED CRITICAL RESULT CALLED TO,  READ BACK BY AND VERIFIED WITH: MORGAN GOBBLE PHARMD 04/24/22 HNM    Klebsiella aerogenes NOT DETECTED NOT DETECTED Final   Klebsiella oxytoca NOT DETECTED NOT DETECTED Final   Klebsiella pneumoniae NOT DETECTED NOT DETECTED Final   Proteus species NOT DETECTED NOT DETECTED Final   Salmonella species NOT DETECTED NOT DETECTED Final   Serratia marcescens NOT DETECTED NOT DETECTED Final   Haemophilus influenzae NOT DETECTED NOT DETECTED Final   Neisseria meningitidis NOT DETECTED NOT  DETECTED Final   Pseudomonas aeruginosa NOT DETECTED NOT DETECTED Final   Stenotrophomonas maltophilia NOT DETECTED NOT DETECTED Final   Candida albicans NOT DETECTED NOT DETECTED Final   Candida auris NOT DETECTED NOT DETECTED Final   Candida glabrata NOT DETECTED NOT DETECTED Final   Candida krusei NOT DETECTED NOT DETECTED Final   Candida parapsilosis NOT DETECTED NOT DETECTED Final   Candida tropicalis NOT DETECTED NOT DETECTED Final   Cryptococcus neoformans/gattii NOT DETECTED NOT DETECTED Final   CTX-M ESBL NOT DETECTED NOT DETECTED Final   Carbapenem resistance IMP NOT DETECTED NOT DETECTED Final   Carbapenem resistance KPC NOT DETECTED NOT DETECTED Final   Carbapenem resistance NDM NOT DETECTED NOT DETECTED Final   Carbapenem resist OXA 48 LIKE NOT DETECTED NOT DETECTED Final   Carbapenem resistance VIM NOT DETECTED NOT DETECTED Final    Comment: Performed at [facility], [address], [place], [place] [address]  Urine Culture (for pregnant, neutropenic or urologic patients or patients with an indwelling urinary catheter)     Status: None   Collection Time: 04/24/22  8:23 AM   Specimen: Urine, Clean Catch  Result Value Ref Range Status   Specimen Description   Final    URINE, CLEAN CATCH Performed at [facility], [address] [place], [place] [address]    Special Requests   Final    NONE Performed at [facility], [address] [place], [place] [address]    Culture   Final    NO GROWTH Performed at [facility], [address], [place], [place] [address]    Report Status 04/26/2022 FINAL  Final  Culture, blood (Routine X 2) w Reflex to ID Panel     Status: None   Collection Time: 04/25/22  6:18 AM   Specimen: BLOOD RIGHT ARM  Result Value Ref Range Status   Specimen Description BLOOD RIGHT ARM  Final   Special Requests IN PEDIATRIC BOTTLE Blood Culture adequate volume  Final   Culture   Final    NO GROWTH 5 DAYS Performed  at [facility], [address], [place], [place] [address]    Report Status 04/30/2022 FINAL  Final  Culture, blood (Routine X 2) w Reflex to ID Panel     Status: None   Collection Time: 04/25/22  6:18 AM   Specimen: BLOOD  Result Value Ref Range Status   Specimen Description BLOOD RIGHT ARM  Final   Special Requests   Final    BOTTLES DRAWN AEROBIC ONLY Blood Culture adequate volume   Culture   Final    NO GROWTH 5 DAYS Performed at [facility], [address] [place], [place] [address]    Report Status 04/30/2022 FINAL  Final  MRSA Next Gen by PCR, Nasal     Status: None   Collection Time: 04/25/22  2:39 PM   Specimen: Nasal Mucosa; Nasal Swab  Result Value Ref Range Status   MRSA by PCR Next Gen NOT  DETECTED NOT DETECTED Final    Comment: (NOTE) The GeneXpert MRSA Assay (FDA approved for NASAL specimens only), is one component of a comprehensive MRSA colonization surveillance program. It is not intended to diagnose MRSA infection nor to guide or monitor treatment for MRSA infections. Test performance is not FDA approved in patients less than 30 years old. Performed at [facility], [address] [place], [place] [address]          Radiology Studies: Overnight EEG with video  Result Date: 04/30/2022 Lora Havens, MD     04/30/2022 10:21 AM Patient Name: Oscar Elliott MRN: [id number] Epilepsy Attending: Lora Havens Referring Physician/Provider: Derek Jack, MD Duration: 04/29/2022 1658 to 04/30/2022 0957  Patient history:  62 year old male who presented with generalized myoclonus. EEG to evaluate for seizure  Level of alertness: Awake  AEDs during EEG study: TPM  Technical aspects: This EEG study was done with scalp electrodes positioned according to the 10-20 International system of electrode placement. Electrical activity was reviewed with band pass filter of 1-'70Hz'$ , sensitivity of 7 uV/mm, display speed of 66m/sec with a '60Hz'$   notched filter applied as appropriate. EEG data were recorded continuously and digitally stored.  Video monitoring was available and reviewed as appropriate.  Description: The posterior dominant rhythm consists of 8 Hz activity of moderate voltage (25-35 uV) seen predominantly in posterior head regions, symmetric and reactive to eye opening and eye closing. Hyperventilation and photic stimulation were not performed.    IMPRESSION: This study is within normal limits. No seizures or epileptiform discharges were seen throughout the recording. Please note a normal interictal EEG does not exclude  the diagnosis of epilepsy. Priyanka [name]       Scheduled Meds:  Chlorhexidine Gluconate Cloth  6 each Topical Daily   cloZAPine  100 mg Oral Daily   cloZAPine  500 mg Oral QHS   insulin aspart  0-9 Units Subcutaneous Q4H   thiamine  100 mg Oral Daily   Or   thiamine (VITAMIN B1) injection  100 mg Intravenous Daily   topiramate  12.5 mg Oral BID   Continuous Infusions:  sodium chloride Stopped (04/29/22 1619)   cefTRIAXone (ROCEPHIN)  IV 2 g (05/01/22 1235)     LOS: 7 days   Time spent: 272m  Alany Borman C Colan Laymon, DO [facility]  If 7PM-7AM, please contact night-coverage [email or web address]  05/02/2022, 7:29 AM

## 2022-05-02 NOTE — Plan of Care (Signed)

## 2022-05-02 NOTE — TOC Progression Note (Signed)
Transition of Care [facility]) - Progression Note    Patient Details  Name: Oscar Elliott MRN: [id number] Date of Birth: 1960-12-26  Transition of Care [facility]) CM/SW [place], [place] Phone Number: 05/02/2022, 12:10 PM  Clinical Narrative:     Josem Kaufmann pending as of 915am. TOC will continue to follow for auth approval and DC to SNF.   Expected Discharge Plan: [place] Barriers to Discharge: Continued Medical Work up, Ship broker  Expected Discharge Plan and [place] Choice: [place] Living arrangements for the past 2 months: Group Home                                       Social Determinants of Health (SDOH) Interventions SDOH Screenings   Tobacco Use: High Risk (04/28/2022)    Readmission Risk Interventions     No data to display

## 2022-05-03 ENCOUNTER — Encounter (HOSPITAL_COMMUNITY): Payer: Self-pay | Admitting: Internal Medicine

## 2022-05-03 ENCOUNTER — Other Ambulatory Visit: Payer: Self-pay

## 2022-05-03 LAB — GLUCOSE, CAPILLARY
Glucose-Capillary: 109 mg/dL — ABNORMAL HIGH (ref 70–99)
Glucose-Capillary: 116 mg/dL — ABNORMAL HIGH (ref 70–99)
Glucose-Capillary: 121 mg/dL — ABNORMAL HIGH (ref 70–99)

## 2022-05-03 MED ORDER — ADULT MULTIVITAMIN W/MINERALS CH: 1.0000 | ORAL_TABLET | Freq: Every day | ORAL | 2 refills | Status: AC

## 2022-05-03 MED ORDER — VITAMIN B-1 100 MG PO TABS: 100.0000 mg | ORAL_TABLET | Freq: Every day | ORAL | 2 refills | Status: AC

## 2022-05-03 MED ORDER — ENSURE ENLIVE PO LIQD: 237.0000 mL | Freq: Two times a day (BID) | ORAL | 12 refills | Status: AC

## 2022-05-03 MED ORDER — TOPIRAMATE 25 MG PO TABS: 12.5000 mg | ORAL_TABLET | Freq: Two times a day (BID) | ORAL | 0 refills | Status: DC

## 2022-05-03 NOTE — Progress Notes (Signed)
Occupational Therapy Treatment Patient Details Name: Oscar Elliott MRN: [id number] DOB: January 21, 1961 Today's Date: 05/03/2022   History of present illness The pt is a 62 yo male presenting to [facility] 2/26 with headache and tremors, is alert but not oriented. Work up showed UTI, EEG on 2/28 showed 2 seizures, and transferred to [facility] for continuous EEG. 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  SDH stable on CT head on 3/2 and 3/3. Repeat LTM EEG on 3/4 within normal limits. PMHx of HTN, HLD, CAD c/b STEMI (s/p DES to RCA 10/2013), PVD (s/p L AKA), T2DM, CKD, schizophrenia   OT comments  Pt admitted as above. Patient presenting with cognitive impairments (potentially baseline),c/o headache/dizziness in sitting EOB (VSS) and need for assist to complete ADLs. Pt was seen for ADL's sitting up at EOB unsupported/close supervision level for grooming (vc's and step by step instruction to maintain task at hand), bathing/dressing UB with Min A sitting up at EOB, LB bathing and dressing with min-mod A. Pt benefits from vc's to stay on task throughout session. Pt bed linens were wet therefore his sheets were changed and NT notified that ADL's were completed.  Continue to recommend SNF.   Recommendations for follow up therapy are one component of a multi-disciplinary discharge planning process, led by the attending physician.  Recommendations may be updated based on patient status, additional functional criteria and insurance authorization.    Follow Up Recommendations  Skilled nursing-short term rehab (<3 hours/day)     Assistance Recommended at Discharge Frequent or constant Supervision/Assistance  Patient can return home with the following  Two people to help with walking and/or transfers;Assistance with feeding;Direct supervision/assist for medications management;Direct supervision/assist for financial management;Assist for  transportation;Help with stairs or ramp for entrance;A little help with bathing/dressing/bathroom   Equipment Recommendations  Other (comment) (defer to next venue)    Recommendations for Other Services      Precautions / Restrictions Precautions Precautions: Fall Precaution Comments: L AKA, no prosthetic in room       Mobility Bed Mobility Overal bed mobility: Needs Assistance Bed Mobility: Supine to Sit, Sit to Supine     Supine to sit: Supervision, HOB elevated Sit to supine: Supervision   General bed mobility comments: Close supervision and verbal and tactile cues    Transfers Overall transfer level:  (NT)   General transfer comment: HR in 120's throughout session noted     Balance Overall balance assessment: Needs assistance Sitting-balance support: Single extremity supported, No upper extremity supported, Feet unsupported, Feet supported Sitting balance-Leahy Scale: Fair Sitting balance - Comments: Close supervision for sitting at EOB during ADL's, no hands on assist, 1x vc to maintain midline     ADL either performed or assessed with clinical judgement   ADL Overall ADL's : Needs assistance/impaired Eating/Feeding: Minimal assistance;Bed level Eating/Feeding Details (indicate cue type and reason): HOB elevated Grooming: Minimal assistance;Sitting Grooming Details (indicate cue type and reason): Sitting up at EOB with set-up and step by step instruction Upper Body Bathing: Minimal assistance;Sitting   Lower Body Bathing: Minimal assistance;Moderate assistance;Sitting/lateral leans;Bed level Lower Body Bathing Details (indicate cue type and reason): Sitting up at EOB and sitting/lat leans at bed level Upper Body Dressing : Minimal assistance;Sitting   Lower Body Dressing: Moderate assistance;Maximal assistance;Bed level;Sitting/lateral leans Lower Body Dressing Details (indicate cue type and reason): sitting up at EOB   Functional mobility during ADLs:   (NT) General  ADL Comments: Patient presenting with cognitive impairments (potentially baseline),c/o headache/dizziness in sitting EOB (VSS) and need for assist to complete ADLs. Pt was seen for ADL's sitting up at EOB unsupported/close supervision level for grooming (vc's and step by step instruction to maintain task at hand), bathing/dressing UB with Min A sitting up at EOB, LB bathing and dressing with min-mod A. Pt benefits from vc's to stay on task throughout session. Pt bed linens were wet therefore his sheets were changed and NT notified that ADL's were completed.  Continue to recommend SNF.    Extremity/Trunk Assessment Upper Extremity Assessment Upper Extremity Assessment: Generalized weakness;Defer to OT evaluation   Lower Extremity Assessment Lower Extremity Assessment: Defer to PT evaluation        Vision Baseline Vision/History: 1 Wears glasses Ability to See in Adequate Light: 0 Adequate Patient Visual Report: Other (comment) (Continue to assess in functional context)            Cognition Arousal/Alertness: Awake/alert Behavior During Therapy: Impulsive Overall Cognitive Status: History of cognitive impairments - at baseline              General Comments VSS on RA    Pertinent Vitals/ Pain       Pain Assessment Pain Assessment: Faces Faces Pain Scale: Hurts a little bit Pain Location: Headache Pain Descriptors / Indicators: Headache Pain Intervention(s): Limited activity within patient's tolerance, Monitored during session, Repositioned (RN staff made aware)  Home Living Family/patient expects to be discharged to:: Skilled nursing facility      Prior Functioning/Environment   Refer to initial OT assessment, was in group home per chart review   Frequency  Min 2X/week        Progress Toward Goals  OT Goals(current goals can now be found in the care plan section)     Acute Rehab OT Goals Patient Stated Goal: Feel better OT Goal Formulation: With  patient Time For Goal Achievement: 05/14/22 Potential to Achieve Goals: [place]   SNF rehab      AM-PAC OT "6 Clicks" Daily Activity     Outcome Measure   Help from another person eating meals?: A Lot Help from another person taking care of personal grooming?: A Little Help from another person toileting, which includes using toliet, bedpan, or urinal?: Total Help from another person bathing (including washing, rinsing, drying)?: A Little Help from another person to put on and taking off regular upper body clothing?: A Little Help from another person to put on and taking off regular lower body clothing?: A Lot 6 Click Score: 14    End of Session    OT Visit Diagnosis: Unsteadiness on feet (R26.81);Other abnormalities of gait and mobility (R26.89);Muscle weakness (generalized) (M62.81);Other symptoms and signs involving cognitive function;Other symptoms and signs involving the nervous system (R29.898);Dizziness and giddiness (R42)   Activity Tolerance Patient tolerated treatment well   Patient Left in bed;with call bell/phone within reach;with bed alarm set   Nurse Communication Mobility status;Other (comment) (NT to replace pure wick)        Time: [id number] OT Time Calculation (min): 27 min  Charges: OT General Charges $OT Visit: 1 Visit OT Treatments $Self Care/Home Management : 23-37 mins   Almyra Deforest, OTR/L 05/03/2022, 9:42 AM

## 2022-05-03 NOTE — Progress Notes (Signed)
Attempted to call report without success; Will attempt again. Bedside and LCSW notified.

## 2022-05-03 NOTE — TOC Transition Note (Signed)
Transition of Care [facility]) - CM/SW Discharge Note   Patient Details  Name: Oscar Elliott MRN: [id number] Date of Birth: [birth year]  [facility]) CM/SW Contact:  Geralynn Ochs, LCSW Phone Number: 05/03/2022, 12:59 PM   Clinical Narrative:   CSW notified by Milus Glazier that insurance was approved for patient to admit. CSW updated MD, confirmed medical stability. CSW sent discharge information. CSW updated group home director, who was in agreement. Transport arranged with [facility] for next available.  Nurse to call report to [phone], Room 505B.    Final next level of care: Skilled Nursing Facility Barriers to Discharge: Barriers Resolved   Patient Goals and CMS Choice CMS Medicare.gov Compare Post Acute Care list provided to:: Patient Represenative (must comment) Choice offered to / list presented to :  (group home owner)  Discharge Placement                Patient chooses bed at: [facility] Patient to be transferred to facility by: [facility] Name of family member notified: Lonnie Patient and family notified of of transfer: 05/03/22  Discharge Plan and Services Additional resources added to the After Visit Summary for       Post Acute Care Choice: [place]                               Social Determinants of Health (SDOH) Interventions SDOH Screenings   Tobacco Use: High Risk (05/03/2022)     Readmission Risk Interventions     No data to display

## 2022-05-03 NOTE — Discharge Summary (Addendum)
Physician Discharge Summary  Oscar Elliott [id number] DOB: 09/01/1960 DOA: 04/25/2022  PCP: Lauretta Grill, NP  Admit date: 04/25/2022 Discharge date: 05/03/2022  Admitted From: Home Disposition: SNF  Recommendations for Outpatient Follow-up:  Follow up with PCP in 1-2 weeks Follow home glucose eatings closely given insulin has been discontinued:  Discharge Condition: Stable CODE STATUS: Full Diet recommendation: Low-salt low-fat diabetic diet  Brief/Interim Summary: Oscar Elliott is a 62 y.o. 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 CXR and CT head without acute finding. Renal US with stable nonobstructing 49m L renal calculus.Cultures obtained.  Started on broad-spectrum antibiotics and admitted.     Patient was noted to have E. coli bacteremia and E. coli UTI.antibiotic de-escalated to IV CTX.  Patient remained altered despite appropriate treatment of sepsis, he was having jerking movements, neurology was consulted, patient was loaded with valproic acid, then was started on 500 mg twice daily, [facility].  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  SDH stable on CT head on 3/2 and 3/3.  Patient's insulin was discontinued given marked improvement in glucose levels with close dietary management.   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  Antiepileptics changed to topiramate (see neuronote for rationale).  Repeat LTM EEG on 3/4 within normal limits.  Therapy recommended SNF -excepted to facility today, insurance approved will transition to SNF for ongoing physical therapy and treatment as above.  Discharge  Diagnoses:  Active Problems:   Peripheral vascular disease (HCC)   Schizophrenia (HCC)   Essential hypertension   CAD -S/P RCA DES Sept 2015   Insulin dependent type 2 diabetes mellitus (HCC)   [place]) [place]   Acute metabolic encephalopathy   Oropharyngeal dysphagia   Normocytic anemia   Thrombocytopenia (HCC)   Physical deconditioning  Seizures disorder/seizure-like activity:  -Initial CT head without acute finding.   -MRI on 3/1 with 6 mm SDH which is a stable on subsequent CT.   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   -Repeat LTM EEG on 3/4 within normal limits.   -Antiepileptics changed to topiramate (see neuro note for rationale).   -Seizure precaution -Outpatient follow-up with neurology   Severe sepsis due to E. coli bacteremia and UTI: POA.   -Tachycardia, tachypnea, AKI and lactic acidosis on presentation.  -Blood and urine cultures with pansensitive E. coli.  Repeat blood cultures NGTD. -Completed ceftriaxone [id number]  Acute metabolic encephalopathy, resolving:  -Could be due to sepsis and possibly postictal.  Resolving, likely at baseline -Minimize sedating medications discontinue multiple home medications as below   Schizophrenia with paranoia and visual hallucination: -Reports seeing an airplane fight. Otherwise stable. -Appreciate psychiatry recs on 3/2             -Clozaril 100 mg PO Qam and 500 mg PO QHS  -Depakote 500 mg PO Q12.  Discontinued by neurology due to subdural hematoma -PRN Ativan and Haldol for agitation (he has not required in days, discontinue both) -DC trazodone.   Oropharyngeal dysphagia:  -Continue to advance diet as tolerated, dysphagia 3 diet most recently   History of CAD with STEMI s/p DES 2015 PVD s/p L AKA -Off  antiplatelets due to SDH. -Continue statin   Subdural hematoma: MRI on 3/1 showed 6 mm SDH with no mass effect.  Stable on subsequent CTs. -Continue monitoring  clinically   AKI in the setting of sepsis: Resolved.   Controlled IDDM-2: A1c 6.7% -insulin discontinued given control over the past few days on diet   Normocytic anemia: Slight drop in Hgb likely due to critical illness and hemodilution.  No obvious bleeding.  Anemia panel consistent with anemia of chronic disease.    Thrombocytopenia: Improving.    Hypernatremia: Resolved. -Continue hypotonic fluid   Hypokalemia/hypophosphatemia -Stable   Physical deconditioning/left AKA -PT/OT-recommending SNF   Inadequate oral intake Body mass index is 24.7 kg/m. -Consult dietitian  Discharge Instructions  Discharge Instructions     Ambulatory referral to Neurology   Complete by: As directed    An appointment is requested in approximately: 8 -12 weeks      Allergies as of 05/03/2022       Reactions   Penicillins Other (See Comments)   DIZZINESS         Medication List     STOP taking these medications    aspirin EC 81 MG tablet   cefTRIAXone 2 g in sodium chloride 0.9 % 100 mL   clonazePAM 0.5 MG tablet Commonly known as: KLONOPIN   clopidogrel 75 MG tablet Commonly known as: PLAVIX   gabapentin 100 MG capsule Commonly known as: NEURONTIN   insulin aspart 100 UNIT/ML injection Commonly known as: novoLOG   insulin detemir 100 UNIT/ML injection Commonly known as: LEVEMIR   sodium chloride 0.9 % SOLN 50 mL with thiamine 100 MG/ML SOLN 500 mg   valproate 500 mg in dextrose 5 % 50 mL       TAKE these medications    acetaminophen 500 MG tablet Commonly known as: TYLENOL Take 500 mg by mouth every 4 (four) hours as needed for moderate pain.   cloZAPine 100 MG tablet Commonly known as: CLOZARIL 100 mg in AM and 500 mg at night What changed:  how much to take how to take this when to take this additional instructions   cyanocobalamin 1000 MCG tablet Take 1 tablet (1,000 mcg total) by mouth daily.   docusate sodium 100 MG capsule Commonly known as:  COLACE Take 100 mg by mouth 2 (two) times daily.   EasyMax Test test strip Generic drug: glucose blood 1 each by Other route as needed.   feeding supplement Liqd Take 237 mLs by mouth 2 (two) times daily with a meal.   ketoconazole 2 % shampoo Commonly known as: NIZORAL Apply 1 application topically 2 (two) times a week.   latanoprost 0.005 % ophthalmic solution Commonly known as: XALATAN Place 1 drop into both eyes at bedtime.   Linzess 145 MCG Caps capsule Generic drug: linaclotide TAKE 1 CAPSULE BY MOUTH ONCE DAILY BEFORE BREAKFAST. What changed: See the new instructions.   multivitamin with minerals Tabs tablet Take 1 tablet by mouth daily. Start taking on: May 04, 2022   omeprazole 40 MG capsule Commonly known as: PRILOSEC Take 40 mg by mouth 2 (two) times daily.   polyethylene glycol 17 g packet Commonly known as: MIRALAX / GLYCOLAX Take 17 g by mouth daily as needed for moderate constipation.   tamsulosin 0.4 MG Caps capsule Commonly known as: FLOMAX Take 0.4 mg by mouth daily.   thiamine 100 MG tablet Commonly known as: Vitamin B-1 Take 1 tablet (100 mg total) by mouth daily. Start taking on:  May 04, 2022   topiramate 25 MG tablet Commonly known as: TOPAMAX Take 0.5 tablets (12.5 mg total) by mouth 2 (two) times daily.   Vitamin D 50 MCG (2000 UT) Caps Take 2,000 Units by mouth once a week.        Contact information for after-discharge care     Destination     [facility] .   Service: Skilled Nursing Contact information: [address] [place] [phone]                    Allergies  Allergen Reactions   Penicillins Other (See Comments)    DIZZINESS     Consultations: Neurology, psychiatry   Procedures/Studies: Overnight EEG with video  Result Date: 04/30/2022 Lora Havens, MD     04/30/2022 10:21 AM Patient Name: Oscar Elliott MRN: [id number] Epilepsy  Attending: Lora Havens Referring Physician/Provider: Derek Jack, MD Duration: 04/29/2022 1658 to 04/30/2022 0957  Patient history:  62 year old male who presented with generalized myoclonus. EEG to evaluate for seizure  Level of alertness: Awake  AEDs during EEG study: TPM  Technical aspects: This EEG study was done with scalp electrodes positioned according to the 10-20 International system of electrode placement. Electrical activity was reviewed with band pass filter of 1-'70Hz'$ , sensitivity of 7 uV/mm, display speed of 59m/sec with a '60Hz'$  notched filter applied as appropriate. EEG data were recorded continuously and digitally stored.  Video monitoring was available and reviewed as appropriate.  Description: The posterior dominant rhythm consists of 8 Hz activity of moderate voltage (25-35 uV) seen predominantly in posterior head regions, symmetric and reactive to eye opening and eye closing. Hyperventilation and photic stimulation were not performed.    IMPRESSION: This study is within normal limits. No seizures or epileptiform discharges were seen throughout the recording. Please note a normal interictal EEG does not exclude  the diagnosis of epilepsy. [place]  CT HEAD WO CONTRAST (5MM)  Result Date: 04/29/2022 CLINICAL DATA:  Neuro deficit, acute stroke suspected. EXAM: CT HEAD WITHOUT CONTRAST TECHNIQUE: Contiguous axial images were obtained from the base of the skull through the vertex without intravenous contrast. RADIATION DOSE REDUCTION: This exam was performed according to the departmental dose-optimization program which includes automated exposure control, adjustment of the mA and/or kV according to patient size and/or use of iterative reconstruction technique. COMPARISON:  CT examination dated April 28, 2022 FINDINGS: Brain: Stable appearance of the subdural hemorrhage along the tentorium, posterior falx and right of the posterior parietal/occipital lobe. No evidence of hydrocephalus.  No significant interval change. Vascular: No hyperdense vessel or unexpected calcification. Skull: Normal. Negative for fracture or focal lesion. Sinuses/Orbits: No acute finding. Other: None IMPRESSION: Stable appearance of the subdural hemorrhage along the tentorium, posterior falx and right of the posterior parietal/occipital lobe. No evidence of hydrocephalus. No significant interval change. Electronically Signed   By: IKeane PoliceD.O.   On: 04/29/2022 13:20   DG CHEST PORT 1 VIEW  Result Date: 04/29/2022 CLINICAL DATA:  Cough EXAM: PORTABLE CHEST 1 VIEW COMPARISON:  Chest x-ray April 17, 2022 FINDINGS: No pneumothorax. The cardiomediastinal silhouette is stable. Bibasilar opacities and suspected small layering left effusion. No other acute abnormalities. IMPRESSION: Bibasilar opacities and suspected small layering left effusion. Findings could represent developing infiltrates given history. Recommend short-term follow-up imaging to ensure resolution. Electronically Signed   By: DDorise BullionIII M.D.   On: 04/29/2022 10:36   CT HEAD WO  CONTRAST (5MM)  Result Date: 04/28/2022 CLINICAL DATA:  Somnolence EXAM: CT HEAD WITHOUT CONTRAST TECHNIQUE: Contiguous axial images were obtained from the base of the skull through the vertex without intravenous contrast. RADIATION DOSE REDUCTION: This exam was performed according to the departmental dose-optimization program which includes automated exposure control, adjustment of the mA and/or kV according to patient size and/or use of iterative reconstruction technique. COMPARISON:  Head CT 04/23/2022.  Brain MRI from yesterday FINDINGS: Brain: Subdural hematoma along the bilateral tentorium, posterior falx, and right occipital parietal convexity only measuring up to 4 mm in thickness along the right cerebral convexity. No progression compared to prior MRI. No superimposed infarct, hydrocephalus, or collection. Pituitary lesion better seen by preceding MRI.  Vascular: No hyperdense vessel or unexpected calcification. Skull: Normal. Negative for fracture or focal lesion. Sinuses/Orbits: No acute finding. IMPRESSION: Thin subdural hemorrhage along the tentorium, posterior falx, and right posterior cerebral convexity. No progression since MRI yesterday. No new abnormality. Electronically Signed   By: Jorje Guild M.D.   On: 04/28/2022 04:25   MR BRAIN WO CONTRAST  Result Date: 04/28/2022 CLINICAL DATA:  Initial evaluation for seizure disorder, clinical change. EXAM: MRI HEAD WITHOUT CONTRAST TECHNIQUE: Multiplanar, multiecho pulse sequences of the brain and surrounding structures were obtained without intravenous contrast. COMPARISON:  Prior CT from 04/23/2022. FINDINGS: Brain: Examination degraded by motion artifact. Cerebral volume within normal limits for age. No significant cerebral white matter disease. Small remote lacunar infarct noted at the left basal ganglia (series 16, image 13). Small subdural hematoma seen overlying the posterior right cerebral convexity. This measures up to 6 mm in maximal thickness at the level of the right parietal lobe (series 19, image 35). No significant mass effect. Additional trace hemorrhage noted overlying the contralateral posterior left cerebral hemisphere and cerebellum. 6 mm focus of apparent restricted diffusion involving the peripheral right temporal cortex (series 17, image 69). Associated susceptibility artifact present at this location (series 12, image 24). Finding is favored to be related to the underlying subdural hemorrhage, although a small cortical infarct with petechial blood products is difficult to exclude. No other evidence for acute or subacute ischemia. Gray-white matter differentiation otherwise maintained. No other areas of chronic cortical infarction. No other chronic intracranial blood products. 1.1 cm T1/FLAIR hyperintense lesion seen involving the pituitary gland (series 9, image 13), indeterminate.  Associated mild suprasellar extension. No other mass lesion, mass effect or midline shift. No hydrocephalus or extra-axial fluid collection. Vascular: Major intracranial vascular flow voids are maintained. Skull and upper cervical spine: Craniocervical junction grossly within normal limits. Bone marrow signal intensity grossly normal. No visible scalp soft tissue abnormality. Plagiocephaly noted. Sinuses/Orbits: Prior bilateral ocular lens replacement. Paranasal sinuses are largely clear. No significant mastoid effusion. Other: None. IMPRESSION: 1. Motion degraded exam. 2. Small subdural hematoma overlying the posterior right cerebral convexity, measuring up to 6 mm in maximal thickness. No significant mass effect. 3. 6 mm focus of restricted diffusion involving the peripheral right temporal cortex. Finding is favored to be related to the underlying subdural hemorrhage, although a small cortical infarct with petechial blood products is difficult to exclude. 4. 1.1 cm T1/FLAIR hyperintense lesion involving the pituitary gland, indeterminate. Primary differential considerations include a proteinaceous and/or hemorrhagic Rathke's cleft cyst or adenoma. Possible apoplexy could be considered in the correct clinical setting. Correlation with laboratory values recommended. Further evaluation dedicated pituitary protocol MRI could be performed for further evaluation as warranted. 5. Small remote left basal ganglia lacunar infarct. These results were  communicated to Dr. Lorrin Goodell at 3:12 am on 04/28/2022 by text page via the [facility] messaging system. Electronically Signed   By: Jeannine Boga M.D.   On: 04/28/2022 03:13   DG Abd 1 View  Result Date: 04/27/2022 CLINICAL DATA:  [id number] Metal foreign body in abdomen [id number] EXAM: ABDOMEN - 1 VIEW COMPARISON:  None Available. FINDINGS: Nonobstructive bowel gas pattern. There is a 1.3 cm calcification overlying the lower pole of the left kidney. No evidence of bowel  obstruction. Degenerative changes of the spine and hips. There is femoral head avascular necrosis on the left and possibly of the right femoral head with mild-to-moderate bilateral hip osteoarthritis. No evidence of metallic foreign body. IMPRESSION: No evidence of metallic foreign body. 1.3 cm left lower pole renal stone. Nonobstructive bowel gas pattern. 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 Electronically Signed   By: Maurine Simmering M.D.   On: 04/27/2022 15:12   ECHOCARDIOGRAM COMPLETE  Result Date: 04/26/2022    ECHOCARDIOGRAM REPORT   Patient Name:   ANTERRIO RANDELL Date of Exam: 04/26/2022 Medical Rec #:  [id number]       Height:       61.0 in Accession #:    [id number]      Weight:       122.1 lb Date of Birth:  Aug 11, 1960       BSA:          1.532 m Patient Age:    38 years        BP:           114/63 mmHg Patient Gender: M               HR:           91 bpm. Exam Location:  Inpatient Procedure: 2D Echo, Cardiac Doppler and Color Doppler Indications:    CHF-Acute Systolic [id number]  History:        Patient has prior history of Echocardiogram examinations, most                 recent 05/31/2016. CAD and Previous Myocardial Infarction,                 Signs/Symptoms:Altered Mental Status; Risk Factors:Diabetes,                 Hypertension and Dyslipidemia.  Sonographer:    Greer Pickerel Referring Phys: [id number] STEPHANIE M REESE  Sonographer Comments: Technically challenging study due to limited acoustic windows. Image acquisition challenging due to uncooperative patient and Image acquisition challenging due to respiratory motion. IMPRESSIONS  1. Left ventricular ejection fraction, by estimation, is 60 to 65%. The left ventricle has normal function. The left ventricle has no regional wall motion abnormalities. Left ventricular diastolic parameters are indeterminate.  2. Right ventricular systolic function is normal. The right ventricular  size is normal.  3. A small pericardial effusion is present. The pericardial effusion is circumferential.  4. The mitral valve is normal in structure. Mild mitral valve regurgitation. No evidence of mitral stenosis.  5. The aortic valve is normal in structure. Aortic valve regurgitation is not visualized. No aortic stenosis is present.  6. The inferior vena cava is normal in size with greater than 50% respiratory variability, suggesting right atrial pressure of 3 mmHg. FINDINGS  Left Ventricle: Left ventricular ejection fraction, by estimation, is 60 to 65%. The left ventricle has normal function. The left ventricle has no  regional wall motion abnormalities. The left ventricular internal cavity size was normal in size. There is  no left ventricular hypertrophy. Left ventricular diastolic parameters are indeterminate. Right Ventricle: The right ventricular size is normal. No increase in right ventricular wall thickness. Right ventricular systolic function is normal. Left Atrium: Left atrial size was normal in size. Right Atrium: Right atrial size was normal in size. Pericardium: A small pericardial effusion is present. The pericardial effusion is circumferential. Presence of epicardial fat layer. Mitral Valve: The mitral valve is normal in structure. Mild mitral valve regurgitation. No evidence of mitral valve stenosis. Tricuspid Valve: The tricuspid valve is normal in structure. Tricuspid valve regurgitation is not demonstrated. No evidence of tricuspid stenosis. Aortic Valve: The aortic valve is normal in structure. Aortic valve regurgitation is not visualized. No aortic stenosis is present. Pulmonic Valve: The pulmonic valve was normal in structure. Pulmonic valve regurgitation is not visualized. No evidence of pulmonic stenosis. Aorta: The aortic root is normal in size and structure. Venous: The inferior vena cava is normal in size with greater than 50% respiratory variability, suggesting right atrial pressure of  3 mmHg. IAS/Shunts: No atrial level shunt detected by color flow Doppler.  LEFT VENTRICLE PLAX 2D LVIDd:         4.80 cm LVIDs:         3.30 cm LV PW:         1.00 cm LV IVS:        0.80 cm LVOT diam:     1.80 cm LVOT Area:     2.54 cm  LEFT ATRIUM         Index LA diam:    2.70 cm 1.76 cm/m   AORTA Ao Root diam: 3.00 cm MITRAL VALVE MV Area (PHT): 5.42 cm     SHUNTS MV Decel Time: 140 msec     Systemic Diam: 1.80 cm MR Peak grad: 49.0 mmHg MR Vmax:      350.00 cm/s MV E velocity: 105.00 cm/s MV A velocity: 122.00 cm/s MV E/A ratio:  0.86 Kardie Tobb DO Electronically signed by Berniece Salines DO Signature Date/Time: 04/26/2022/5:13:59 PM    Final    Overnight EEG with video  Result Date: 04/26/2022 Lora Havens, MD     04/27/2022 10:50 AM Patient Name: Oscar Elliott MRN: [id number] Epilepsy Attending: Lora Havens Referring Physician/Provider: Derek Jack, MD Duration: 04/25/2022 2148 to 04/26/2022 2148 Patient history:  62 year old male who presented with generalized myoclonus. EEG to evaluate for seizure Level of alertness: Awake AEDs during EEG study: VPA Technical aspects: This EEG study was done with scalp electrodes positioned according to the 10-20 International system of electrode placement. Electrical activity was reviewed with band pass filter of 1-'70Hz'$ , sensitivity of 7 uV/mm, display speed of 48m/sec with a '60Hz'$  notched filter applied as appropriate. EEG data were recorded continuously and digitally stored.  Video monitoring was available and reviewed as appropriate. Description: The posterior dominant rhythm consists of 8 Hz activity of moderate voltage (25-35 uV) seen predominantly in posterior head regions, symmetric and reactive to eye opening and eye closing. Generalized Spikes were noted frequently. Hyperventilation and photic stimulation were not performed.   ABNORMALITY -Spike, generalized IMPRESSION: This study is consistent with idiopathic generalized epilepsy. No seizures were  seen throughout the recording. Priyanka [name]  Rapid EEG  Result Date: 04/25/2022 YLora Havens MD     04/26/2022  8:39 AM Patient Name: ADaqwon BurketteMRN: [id number] Attending: PCecil Cranker  Hortense Ramal Referring Physician/Provider: Greta Doom, MD Duration: 04/25/2022 1437 to 1721 Patient history: 62 year old male who presented with generalized myoclonus. EEG to evaluate for seizure Level of alertness: lethargic AEDs during EEG study: GBP, Clonazepam Technical aspects: This EEG was obtained using a 10 lead EEG system positioned circumferentially without any parasagittal coverage (rapid EEG). Computer selected EEG is reviewed as  well as background features and all clinically significant events. Description: EEG initially showed continuous generalized 3 to 7 Hz theta-delta slowing. Generalized spikes were also noted, every few seconds. Seizure was noted arising from left/right frontal/temporal/parietal/occipital region. At 1441, generalized and lateralized right hemisphere  rhythmic sharply contoured 6-'7hz'$  theta slowing admixed with 12-'14hz'$  beta activity which then evolved to 2-'3hz'$  delta slowing and involved left hemisphere. Video is not available due to rapid eeg but this is concerning for seizure lasting for 50 seconds.  At 1533, EEG showed run of generalized spikes followed by rhythmic myogenic artifact and eventually generalized eeg attenuation. Per staff, patient had generalized tonic clonic seizure, lasting about 45 seconds.   IV ativan was administered at 1541 after which spikes resolved and eeg showed 6-'8hz'$  theta-alpha activity.   Hyperventilation and photic stimulation were not performed.   ABNORMALITY - Seizures, generalized - Spike,generalized - Continuous slow, generalized IMPRESSION: This limited ceribell EEG initially showed evidence of epileptogenicity with generalized onset and moderate diffuse encephalopathy. One seizure without clinical signs was noted at 1441,  appeared  generalized and lateralized right hemisphere in onset, lasting 50 seconds. Second seizure was noted at 1533, was generalized tonic-clonic per staff, appeared generalized in onset, lasting 45 seconds. IV ativan was administered at 1541 after which eeg improved and was suggestive of mild to moderate diffuse encephalopathy. Dr. Leonel Ramsay was notified. Lora Havens   DG Chest Port 1 View  Result Date: 04/25/2022 CLINICAL DATA:  Shortness of breath. EXAM: PORTABLE CHEST 1 VIEW COMPARISON:  Chest x-ray dated April 23, 2022. FINDINGS: The heart size and mediastinal contours are within normal limits. Normal pulmonary vascularity. Streaky linear opacities at both lung bases consistent with atelectasis. No focal consolidation, pleural effusion, or pneumothorax. No acute osseous abnormality. IMPRESSION: 1. Bibasilar atelectasis. Electronically Signed   By: Titus Dubin M.D.   On: 04/25/2022 12:13   EEG adult  Result Date: 04/24/2022 Greta Doom, MD     04/25/2022  1:49 PM History: 62 yo M with Sedation: None Technique: This EEG was acquired with electrodes placed according to the International 10-20 electrode system (including Fp1, Fp2, F3, F4, C3, C4, P3, P4, O1, O2, T3, T4, T5, T6, A1, A2, Fz, Cz, Pz). The following electrodes were missing or displaced: none. Background: The background consists of intermixed alpha and beta activities. There is a well defined posterior dominant rhythm of  8 Hz that attenuates with eye opening. Sleep is not recorded. There are very frequent generalized spike and wave discharges which are sometimes associated with myoclonus.  At times these occur in isolation, but at other times they do recur with a frequency of 4 to 5 Hz, but always lasting less than a second total.  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 Roland Rack, MD [facility] [phone] If 7pm- 7am, please page neurology on call as listed in [place].   US RENAL  Result Date: 04/23/2022 CLINICAL DATA:  Acute kidney injury EXAM: RENAL / URINARY  TRACT ULTRASOUND COMPLETE COMPARISON:  Ultrasound dated 05/14/2017, CT abdomen and pelvis dated 02/23/2014 FINDINGS: Right Kidney: Length = 10.9 cm AP renal pelvis diameter = <10 mm Normal parenchymal echogenicity with preserved corticomedullary differentiation. No urinary tract dilation or shadowing calculi. The ureter is not seen. Left Kidney: Length = 11.5 cm AP renal pelvis diameter = <10 mm Normal parenchymal echogenicity with preserved corticomedullary differentiation. No urinary tract dilation. Shadowing lower pole stone measures 14 mm, similar to prior examination. The ureter is not seen. Bladder: Appears normal for degree of bladder distention. Other: Incidentally noted thickened appearance of the right adrenal gland, better characterized on prior CT. IMPRESSION: 1. Nonobstructing 14 mm left lower pole renal calculus, similar to prior examination. 2. No urinary tract dilation. Normal sonographic parenchymal echogenicity of the kidneys. 3. Incidentally noted thickened appearance of the right adrenal gland, better characterized on prior CT. Electronically Signed   By: Darrin Nipper M.D.   On: 04/23/2022 17:13   CT Head Wo Contrast  Result Date: 04/23/2022 CLINICAL DATA:  Mental status change, unknown cause. Headaches and tremor beginning today. Jerking of the arms and legs EXAM: CT HEAD WITHOUT CONTRAST TECHNIQUE: Contiguous axial images were obtained from the base of the skull through the vertex without intravenous contrast. RADIATION DOSE REDUCTION: This exam was performed according to the departmental dose-optimization program which includes automated exposure control, adjustment of the mA and/or kV according to  patient size and/or use of iterative reconstruction technique. COMPARISON:  05/30/2016 FINDINGS: Brain: Generalized volume loss without lobar predominance. No focal abnormality seen affecting the brainstem or cerebellum. No evidence of large vessel stroke or advanced small-vessel disease. No cortical or large vessel territory abnormality. No mass, hemorrhage, hydrocephalus or extra-axial collection. Vascular: No abnormal vascular finding. Skull: Chronic plagiocephaly.  No acute finding. Sinuses/Orbits: Clear/normal Other: None IMPRESSION: No acute CT finding. Generalized volume loss without lobar predominance. No evidence of large vessel stroke or advanced small-vessel disease. Electronically Signed   By: Nelson Chimes M.D.   On: 04/23/2022 15:39   DG Chest 2 View  Result Date: 04/23/2022 CLINICAL DATA:  Trauma EXAM: CHEST - 2 VIEW COMPARISON:  CXR 02/27/20 FINDINGS: No pleural effusion. No pneumothorax. Unchanged and normal cardiac and mediastinal contours. No focal airspace opacity. Visualized upper abdomen is notable for colonic gaseous distention. No radiographically apparent displaced rib fractures. Vertebral body heights are maintained. IMPRESSION: 1. No active cardiopulmonary disease. 2. Colonic gaseous distention. Electronically Signed   By: Marin Roberts M.D.   On: 04/23/2022 14:45     Subjective: No acute issues or events overnight   Discharge Exam: Vitals:   05/03/22 0927 05/03/22 0937  BP:    Pulse: (!) 120 (!) 120  Resp:    Temp:    SpO2:     Vitals:   05/03/22 0600 05/03/22 0756 05/03/22 0927 05/03/22 0937  BP:  132/83    Pulse:  100 (!) 120 (!) 120  Resp: 17 (!) 21    Temp:  98.3 F (36.8 C)    TempSrc:  Oral    SpO2:  100%    Weight:      Height:        General: Pt is alert, awake, not in acute distress Cardiovascular: RRR, S1/S2 +, no rubs, no gallops Respiratory: CTA bilaterally, no wheezing, no rhonchi Abdominal: Soft, NT, ND, bowel sounds + Extremities: no edema,  no cyanosis    The results of significant diagnostics from this hospitalization (including imaging, microbiology, ancillary and laboratory) are  listed below for reference.     Microbiology: Recent Results (from the past 240 hour(s))  Urine Culture (for pregnant, neutropenic or urologic patients or patients with an indwelling urinary catheter)     Status: Abnormal   Collection Time: 04/23/22  3:53 PM   Specimen: Urine, Clean Catch  Result Value Ref Range Status   Specimen Description   Final    URINE, CLEAN CATCH Performed at [facility], [address], [place], [place] [address]    Special Requests   Final    NONE Performed at [facility], [address] [place], [place] [address]    Culture >=100,000 COLONIES/mL ESCHERICHIA COLI (A)  Final   Report Status 04/26/2022 FINAL  Final   Organism ID, Bacteria ESCHERICHIA COLI (A)  Final      Susceptibility   Escherichia coli - MIC*    AMPICILLIN 8 SENSITIVE Sensitive     CEFAZOLIN <=4 SENSITIVE Sensitive     CEFEPIME <=0.12 SENSITIVE Sensitive     CEFTRIAXONE <=0.25 SENSITIVE Sensitive     CIPROFLOXACIN <=0.25 SENSITIVE Sensitive     GENTAMICIN <=1 SENSITIVE Sensitive     IMIPENEM <=0.25 SENSITIVE Sensitive     NITROFURANTOIN <=16 SENSITIVE Sensitive     TRIMETH/SULFA <=20 SENSITIVE Sensitive     AMPICILLIN/SULBACTAM 4 SENSITIVE Sensitive     PIP/TAZO <=4 SENSITIVE Sensitive     * >=100,000 COLONIES/mL ESCHERICHIA COLI  Culture, blood (routine x 2)     Status: Abnormal   Collection Time: 04/23/22  3:54 PM   Specimen: BLOOD  Result Value Ref Range Status   Specimen Description   Final    BLOOD BLOOD RIGHT ARM Performed at [facility], [address], [place], [place] [address]    Special Requests   Final    BOTTLES DRAWN AEROBIC AND ANAEROBIC Blood Culture adequate volume Performed at [facility], [address] [place], [place] [address]    Culture  Setup Time   Final    GRAM  NEGATIVE RODS IN BOTH AEROBIC AND ANAEROBIC BOTTLES CRITICAL RESULT CALLED TO, READ BACK BY AND VERIFIED WITHPamelia Hoit [facility] [id number] 04/24/22 HNM Performed at [facility], [address] [place], [place] [address]    Culture ESCHERICHIA COLI (A)  Final   Report Status 04/26/2022 FINAL  Final   Organism ID, Bacteria ESCHERICHIA COLI  Final      Susceptibility   Escherichia coli - MIC*    AMPICILLIN 8 SENSITIVE Sensitive     CEFEPIME <=0.12 SENSITIVE Sensitive     CEFTAZIDIME <=1 SENSITIVE Sensitive     CEFTRIAXONE <=0.25 SENSITIVE Sensitive     CIPROFLOXACIN <=0.25 SENSITIVE Sensitive     GENTAMICIN <=1 SENSITIVE Sensitive     IMIPENEM <=0.25 SENSITIVE Sensitive     TRIMETH/SULFA <=20 SENSITIVE Sensitive     AMPICILLIN/SULBACTAM <=2 SENSITIVE Sensitive     PIP/TAZO <=4 SENSITIVE Sensitive     * ESCHERICHIA COLI  Culture, blood (routine x 2)     Status: Abnormal   Collection Time: 04/23/22  3:54 PM   Specimen: BLOOD  Result Value Ref Range Status   Specimen Description   Final    BLOOD BLOOD LEFT ARM Performed at [facility], [address], [place], [place] [address]    Special Requests   Final    BOTTLES DRAWN AEROBIC AND ANAEROBIC Blood Culture adequate volume Performed at [facility], [address], [place], [place] [address]    Culture  Setup Time   Final    GRAM NEGATIVE RODS IN BOTH AEROBIC AND ANAEROBIC BOTTLES CRITICAL VALUE NOTED.  VALUE IS CONSISTENT WITH PREVIOUSLY REPORTED AND CALLED VALUE. Performed at [facility], [address] [place], [place] [address]    Culture (A)  Final    ESCHERICHIA COLI SUSCEPTIBILITIES PERFORMED ON PREVIOUS CULTURE WITHIN THE LAST 5 DAYS. Performed at [facility], [place] [address] [place], [place] [address]    Report Status 04/26/2022 FINAL  Final  Blood Culture ID Panel (Reflexed)     Status: Abnormal   Collection Time: 04/23/22  3:54 PM  Result Value Ref Range Status    Enterococcus faecalis NOT DETECTED NOT DETECTED Final   Enterococcus Faecium NOT DETECTED NOT DETECTED Final   Listeria monocytogenes NOT DETECTED NOT DETECTED Final   Staphylococcus species NOT DETECTED NOT DETECTED Final   Staphylococcus aureus (BCID) NOT DETECTED NOT DETECTED Final   Staphylococcus epidermidis NOT DETECTED NOT DETECTED Final   Staphylococcus lugdunensis NOT DETECTED NOT DETECTED Final   Streptococcus species NOT DETECTED NOT DETECTED Final   Streptococcus agalactiae NOT DETECTED NOT DETECTED Final   Streptococcus pneumoniae NOT DETECTED NOT DETECTED Final   Streptococcus pyogenes NOT DETECTED NOT DETECTED Final   A.calcoaceticus-baumannii NOT DETECTED NOT DETECTED Final   Bacteroides fragilis NOT DETECTED NOT DETECTED Final   Enterobacterales DETECTED (A) NOT DETECTED Corrected    Comment: Enterobacterales represent a large order of gram negative bacteria, not a single organism. CRITICAL RESULT CALLED TO, READ BACK BY AND VERIFIED WITH: Pamelia Hoit PHARMD [id number] 04/24/22 HNM CORRECTED ON 02/27 AT [id number]: PREVIOUSLY REPORTED AS DETECTED Enterobacterales represent a large order of gram negative bacteria, not a single organism. CRITICAL RESULT CALLED TO, READ BACK BY AND VERIFIED WITH: MORGAN GOBBLE PHARMD 04/24/22 HNM    Enterobacter cloacae complex NOT DETECTED NOT DETECTED Final   Escherichia coli DETECTED (A) NOT DETECTED Corrected    Comment: CRITICAL RESULT CALLED TO, READ BACK BY AND VERIFIED WITH: Pamelia Hoit PHARMD [id number] 04/24/22 HNM CORRECTED ON 02/27 AT [id number]: PREVIOUSLY REPORTED AS DETECTED CRITICAL RESULT CALLED TO, READ BACK BY AND VERIFIED WITH: MORGAN GOBBLE PHARMD 04/24/22 HNM    Klebsiella aerogenes NOT DETECTED NOT DETECTED Final   Klebsiella oxytoca NOT DETECTED NOT DETECTED Final   Klebsiella pneumoniae NOT DETECTED NOT DETECTED Final   Proteus species NOT DETECTED NOT DETECTED Final   Salmonella species NOT DETECTED NOT DETECTED Final   Serratia  marcescens NOT DETECTED NOT DETECTED Final   Haemophilus influenzae NOT DETECTED NOT DETECTED Final   Neisseria meningitidis NOT DETECTED NOT DETECTED Final   Pseudomonas aeruginosa NOT DETECTED NOT DETECTED Final   Stenotrophomonas maltophilia NOT DETECTED NOT DETECTED Final   Candida albicans NOT DETECTED NOT DETECTED Final   Candida auris NOT DETECTED NOT DETECTED Final   Candida glabrata NOT DETECTED NOT DETECTED Final   Candida krusei NOT DETECTED NOT DETECTED Final   Candida parapsilosis NOT DETECTED NOT DETECTED Final   Candida tropicalis NOT DETECTED NOT DETECTED Final   Cryptococcus neoformans/gattii NOT DETECTED NOT DETECTED Final   CTX-M ESBL NOT DETECTED NOT DETECTED Final   Carbapenem resistance IMP NOT DETECTED NOT DETECTED Final   Carbapenem resistance KPC NOT DETECTED NOT DETECTED Final   Carbapenem resistance NDM NOT DETECTED NOT DETECTED Final   Carbapenem resist OXA 48 LIKE NOT DETECTED NOT DETECTED Final   Carbapenem resistance VIM NOT DETECTED NOT DETECTED Final    Comment: Performed at [facility], [address] [place], [place] [address]  Urine Culture (for pregnant, neutropenic or urologic patients or patients with an indwelling urinary catheter)     Status: None   Collection Time: 04/24/22  8:23 AM   Specimen: Urine, Clean Catch  Result Value Ref Range Status   Specimen Description   Final    URINE, CLEAN CATCH Performed at [facility], [address], [place], [place] [address]    Special Requests   Final    NONE Performed at [facility], [address], [place], [place] [address]    Culture   Final    NO GROWTH Performed at [facility], [place] [address] [place], [place] [address]    Report Status 04/26/2022 FINAL  Final  Culture, blood (Routine X 2) w Reflex to ID Panel     Status: None   Collection Time: 04/25/22  6:18 AM   Specimen: BLOOD RIGHT ARM  Result Value Ref Range Status   Specimen Description BLOOD  RIGHT ARM  Final   Special Requests IN PEDIATRIC BOTTLE Blood Culture adequate volume  Final   Culture   Final    NO GROWTH 5 DAYS Performed at [facility], [address], [place], [place] [address]    Report Status 04/30/2022 FINAL  Final  Culture, blood (Routine X 2) w Reflex to ID Panel     Status: None   Collection Time: 04/25/22  6:18 AM   Specimen: BLOOD  Result Value Ref Range Status   Specimen Description BLOOD RIGHT ARM  Final   Special Requests   Final    BOTTLES DRAWN AEROBIC ONLY Blood Culture adequate volume   Culture   Final    NO GROWTH 5 DAYS Performed at [facility], [address], [place], [place] [address]    Report Status 04/30/2022 FINAL  Final  MRSA Next Gen by PCR, Nasal     Status: None   Collection Time: 04/25/22  2:39 PM   Specimen: Nasal Mucosa; Nasal Swab  Result Value Ref Range Status   MRSA by PCR Next Gen NOT DETECTED NOT DETECTED Final    Comment: (NOTE) The GeneXpert MRSA Assay (FDA approved for NASAL specimens only), is one component of a comprehensive MRSA colonization surveillance program. It is not intended to diagnose MRSA infection nor to guide or monitor treatment for MRSA infections. Test performance is not FDA approved in patients less than 28 years old. Performed at [facility], [address] [place], [place] [address]      Labs: BNP (last 3 results) No results for input(s): "BNP" in the last 8760 hours. Basic Metabolic Panel: Recent Labs  Lab 04/27/22 1116 04/28/22 0618 04/29/22 1230 04/30/22 0956 05/01/22 0823 05/02/22 0743  NA 148* 145 146* 145 144 146*  K 3.1* 3.5 3.6 3.2* 3.5 3.7  CL 119* 118* 120* 118* 116* 117*  CO2 19* 21* 21* '24 23 24  '$ GLUCOSE 159* 199* 138* 146* 134* 96  BUN '22 18 14 11 9 9  '$ CREATININE 1.87* 1.68* 1.47* 1.44* 1.42* 1.63*  CALCIUM 7.4* 7.6* 7.4* 7.3* 7.7* 7.9*  MG 2.3 2.1 2.0 1.9 1.7 1.8  PHOS 2.1* 3.2  --   --  1.3* 2.8   Liver Function Tests: Recent  Labs  Lab 04/29/22 1230 04/30/22 0956 05/01/22 0823 05/02/22 0743  AST 33 25 28  --   ALT '23 21 25  '$ --   ALKPHOS 61 66 72  --   BILITOT 0.6  0.6 0.6  --   PROT 4.8* 5.1* 5.4*  --   ALBUMIN 1.6* 1.7* 1.7* 1.8*   No results for input(s): "LIPASE", "AMYLASE" in the last 168 hours. No results for input(s): "AMMONIA" in the last 168 hours. CBC: Recent Labs  Lab 04/28/22 0618 04/29/22 1230 04/30/22 0956 05/01/22 0823 05/02/22 0743  WBC 8.6 10.0 9.9 8.5 8.6  NEUTROABS  --   --   --  6.7  --   HGB 8.8* 8.5* 8.0* 7.7* 7.4*  HCT 25.9* 25.4* 23.5* 22.2* 21.6*  MCV 87.2 87.0 86.1 84.4 85.7  PLT 74* 87* 105* 120* 157   Cardiac Enzymes: Recent Labs  Lab 04/30/22 0956  CKTOTAL 71   BNP: Invalid input(s): "POCBNP" CBG: Recent Labs  Lab 05/02/22 1519 05/02/22 1933 05/02/22 2344 05/03/22 0323 05/03/22 0758  GLUCAP 117* 156* 123* 109* 116*   D-Dimer No results for input(s): "DDIMER" in the last 72 hours. Hgb A1c No results for input(s): "HGBA1C" in the last 72 hours. Lipid Profile Recent Labs    05/01/22 0823  CHOL 68  HDL 18*  LDLCALC 37  TRIG 64  CHOLHDL 3.8   Thyroid function studies Recent Labs    05/01/22 0823  TSH 1.957   Anemia work up Recent Labs    05/01/22 0823  VITAMINB12 964*  FOLATE 7.1  FERRITIN 336  TIBC 153*  IRON 41*  RETICCTPCT 1.3   Urinalysis    Component Value Date/Time   COLORURINE AMBER (A) 04/23/2022 1554   APPEARANCEUR CLOUDY (A) 04/23/2022 1554   APPEARANCEUR Clear 07/08/2013 1606   LABSPEC 1.014 04/23/2022 1554   LABSPEC 1.010 07/08/2013 1606   PHURINE 5.0 04/23/2022 1554   GLUCOSEU NEGATIVE 04/23/2022 1554   GLUCOSEU Negative 07/08/2013 1606   HGBUR MODERATE (A) 04/23/2022 1554   BILIRUBINUR NEGATIVE 04/23/2022 1554   BILIRUBINUR Negative 07/08/2013 1606   KETONESUR NEGATIVE 04/23/2022 1554   PROTEINUR 100 (A) 04/23/2022 1554   UROBILINOGEN 0.2 02/22/2014 1853   NITRITE NEGATIVE 04/23/2022 1554   LEUKOCYTESUR  LARGE (A) 04/23/2022 1554   LEUKOCYTESUR 3+ 07/08/2013 1606   Sepsis Labs Recent Labs  Lab 04/29/22 1230 04/30/22 0956 05/01/22 0823 05/02/22 0743  WBC 10.0 9.9 8.5 8.6   Microbiology Recent Results (from the past 240 hour(s))  Urine Culture (for pregnant, neutropenic or urologic patients or patients with an indwelling urinary catheter)     Status: Abnormal   Collection Time: 04/23/22  3:53 PM   Specimen: Urine, Clean Catch  Result Value Ref Range Status   Specimen Description   Final    URINE, CLEAN CATCH Performed at [facility], [address], [place], [place] [address]    Special Requests   Final    NONE Performed at [facility], [address] [place], [place] [address]    Culture >=100,000 COLONIES/mL ESCHERICHIA COLI (A)  Final   Report Status 04/26/2022 FINAL  Final   Organism ID, Bacteria ESCHERICHIA COLI (A)  Final      Susceptibility   Escherichia coli - MIC*    AMPICILLIN 8 SENSITIVE Sensitive     CEFAZOLIN <=4 SENSITIVE Sensitive     CEFEPIME <=0.12 SENSITIVE Sensitive     CEFTRIAXONE <=0.25 SENSITIVE Sensitive     CIPROFLOXACIN <=0.25 SENSITIVE Sensitive     GENTAMICIN <=1 SENSITIVE Sensitive     IMIPENEM <=0.25 SENSITIVE Sensitive     NITROFURANTOIN <=16 SENSITIVE Sensitive     TRIMETH/SULFA <=20 SENSITIVE Sensitive     AMPICILLIN/SULBACTAM 4  SENSITIVE Sensitive     PIP/TAZO <=4 SENSITIVE Sensitive     * >=100,000 COLONIES/mL ESCHERICHIA COLI  Culture, blood (routine x 2)     Status: Abnormal   Collection Time: 04/23/22  3:54 PM   Specimen: BLOOD  Result Value Ref Range Status   Specimen Description   Final    BLOOD BLOOD RIGHT ARM Performed at [facility], [address] [place], [place] [address]    Special Requests   Final    BOTTLES DRAWN AEROBIC AND ANAEROBIC Blood Culture adequate volume Performed at [facility], [address] [place], [place] [address]    Culture  Setup Time   Final    GRAM  NEGATIVE RODS IN BOTH AEROBIC AND ANAEROBIC BOTTLES CRITICAL RESULT CALLED TO, READ BACK BY AND VERIFIED WITHPamelia Hoit [facility] [id number] 04/24/22 HNM Performed at [facility], [address] [place], [place] [address]    Culture ESCHERICHIA COLI (A)  Final   Report Status 04/26/2022 FINAL  Final   Organism ID, Bacteria ESCHERICHIA COLI  Final      Susceptibility   Escherichia coli - MIC*    AMPICILLIN 8 SENSITIVE Sensitive     CEFEPIME <=0.12 SENSITIVE Sensitive     CEFTAZIDIME <=1 SENSITIVE Sensitive     CEFTRIAXONE <=0.25 SENSITIVE Sensitive     CIPROFLOXACIN <=0.25 SENSITIVE Sensitive     GENTAMICIN <=1 SENSITIVE Sensitive     IMIPENEM <=0.25 SENSITIVE Sensitive     TRIMETH/SULFA <=20 SENSITIVE Sensitive     AMPICILLIN/SULBACTAM <=2 SENSITIVE Sensitive     PIP/TAZO <=4 SENSITIVE Sensitive     * ESCHERICHIA COLI  Culture, blood (routine x 2)     Status: Abnormal   Collection Time: 04/23/22  3:54 PM   Specimen: BLOOD  Result Value Ref Range Status   Specimen Description   Final    BLOOD BLOOD LEFT ARM Performed at [facility], [address], [place], [place] [address]    Special Requests   Final    BOTTLES DRAWN AEROBIC AND ANAEROBIC Blood Culture adequate volume Performed at [facility], [address] [place], [place] [address]    Culture  Setup Time   Final    GRAM NEGATIVE RODS IN BOTH AEROBIC AND ANAEROBIC BOTTLES CRITICAL VALUE NOTED.  VALUE IS CONSISTENT WITH PREVIOUSLY REPORTED AND CALLED VALUE. Performed at [facility], [address] [place], [place] [address]    Culture (A)  Final    ESCHERICHIA COLI SUSCEPTIBILITIES PERFORMED ON PREVIOUS CULTURE WITHIN THE LAST 5 DAYS. Performed at [facility], [place] [address] [place], [place] [address]    Report Status 04/26/2022 FINAL  Final  Blood Culture ID Panel (Reflexed)     Status: Abnormal   Collection Time: 04/23/22  3:54 PM  Result Value Ref Range Status    Enterococcus faecalis NOT DETECTED NOT DETECTED Final   Enterococcus Faecium NOT DETECTED NOT DETECTED Final   Listeria monocytogenes NOT DETECTED NOT DETECTED Final   Staphylococcus species NOT DETECTED NOT DETECTED Final   Staphylococcus aureus (BCID) NOT DETECTED NOT DETECTED Final   Staphylococcus epidermidis NOT DETECTED NOT DETECTED Final   Staphylococcus lugdunensis NOT DETECTED NOT DETECTED Final   Streptococcus species NOT DETECTED NOT DETECTED Final   Streptococcus agalactiae NOT DETECTED NOT DETECTED Final   Streptococcus pneumoniae NOT DETECTED NOT DETECTED Final   Streptococcus pyogenes NOT DETECTED NOT DETECTED Final   A.calcoaceticus-baumannii NOT DETECTED NOT DETECTED Final   Bacteroides fragilis  NOT DETECTED NOT DETECTED Final   Enterobacterales DETECTED (A) NOT DETECTED Corrected    Comment: Enterobacterales represent a large order of gram negative bacteria, not a single organism. CRITICAL RESULT CALLED TO, READ BACK BY AND VERIFIED WITH: Pamelia Hoit PHARMD [id number] 04/24/22 HNM CORRECTED ON 02/27 AT [id number]: PREVIOUSLY REPORTED AS DETECTED Enterobacterales represent a large order of gram negative bacteria, not a single organism. CRITICAL RESULT CALLED TO, READ BACK BY AND VERIFIED WITH: MORGAN GOBBLE PHARMD 04/24/22 HNM    Enterobacter cloacae complex NOT DETECTED NOT DETECTED Final   Escherichia coli DETECTED (A) NOT DETECTED Corrected    Comment: CRITICAL RESULT CALLED TO, READ BACK BY AND VERIFIED WITH: Pamelia Hoit PHARMD [id number] 04/24/22 HNM CORRECTED ON 02/27 AT [id number]: PREVIOUSLY REPORTED AS DETECTED CRITICAL RESULT CALLED TO, READ BACK BY AND VERIFIED WITH: MORGAN GOBBLE PHARMD 04/24/22 HNM    Klebsiella aerogenes NOT DETECTED NOT DETECTED Final   Klebsiella oxytoca NOT DETECTED NOT DETECTED Final   Klebsiella pneumoniae NOT DETECTED NOT DETECTED Final   Proteus species NOT DETECTED NOT DETECTED Final   Salmonella species NOT DETECTED NOT DETECTED Final   Serratia  marcescens NOT DETECTED NOT DETECTED Final   Haemophilus influenzae NOT DETECTED NOT DETECTED Final   Neisseria meningitidis NOT DETECTED NOT DETECTED Final   Pseudomonas aeruginosa NOT DETECTED NOT DETECTED Final   Stenotrophomonas maltophilia NOT DETECTED NOT DETECTED Final   Candida albicans NOT DETECTED NOT DETECTED Final   Candida auris NOT DETECTED NOT DETECTED Final   Candida glabrata NOT DETECTED NOT DETECTED Final   Candida krusei NOT DETECTED NOT DETECTED Final   Candida parapsilosis NOT DETECTED NOT DETECTED Final   Candida tropicalis NOT DETECTED NOT DETECTED Final   Cryptococcus neoformans/gattii NOT DETECTED NOT DETECTED Final   CTX-M ESBL NOT DETECTED NOT DETECTED Final   Carbapenem resistance IMP NOT DETECTED NOT DETECTED Final   Carbapenem resistance KPC NOT DETECTED NOT DETECTED Final   Carbapenem resistance NDM NOT DETECTED NOT DETECTED Final   Carbapenem resist OXA 48 LIKE NOT DETECTED NOT DETECTED Final   Carbapenem resistance VIM NOT DETECTED NOT DETECTED Final    Comment: Performed at [facility], [address], [place], [place] [address]  Urine Culture (for pregnant, neutropenic or urologic patients or patients with an indwelling urinary catheter)     Status: None   Collection Time: 04/24/22  8:23 AM   Specimen: Urine, Clean Catch  Result Value Ref Range Status   Specimen Description   Final    URINE, CLEAN CATCH Performed at [facility], [address], [place], [place] [address]    Special Requests   Final    NONE Performed at [facility], [address], [place], [place] [address]    Culture   Final    NO GROWTH Performed at [facility], [address] [place] [address] [place], [place] [address]    Report Status 04/26/2022 FINAL  Final  Culture, blood (Routine X 2) w Reflex to ID Panel     Status: None   Collection Time: 04/25/22  6:18 AM   Specimen: BLOOD RIGHT ARM  Result Value Ref Range Status   Specimen Description BLOOD  RIGHT ARM  Final   Special Requests IN PEDIATRIC BOTTLE Blood Culture adequate volume  Final   Culture   Final    NO GROWTH 5 DAYS Performed at [facility], [address] [place], [place] [address]    Report Status 04/30/2022 FINAL  Final  Culture,  blood (Routine X 2) w Reflex to ID Panel     Status: None   Collection Time: 04/25/22  6:18 AM   Specimen: BLOOD  Result Value Ref Range Status   Specimen Description BLOOD RIGHT ARM  Final   Special Requests   Final    BOTTLES DRAWN AEROBIC ONLY Blood Culture adequate volume   Culture   Final    NO GROWTH 5 DAYS Performed at [facility], [address] [place], [place] [address]    Report Status 04/30/2022 FINAL  Final  MRSA Next Gen by PCR, Nasal     Status: None   Collection Time: 04/25/22  2:39 PM   Specimen: Nasal Mucosa; Nasal Swab  Result Value Ref Range Status   MRSA by PCR Next Gen NOT DETECTED NOT DETECTED Final    Comment: (NOTE) The GeneXpert MRSA Assay (FDA approved for NASAL specimens only), is one component of a comprehensive MRSA colonization surveillance program. It is not intended to diagnose MRSA infection nor to guide or monitor treatment for MRSA infections. Test performance is not FDA approved in patients less than 67 years old. Performed at [facility], [address] [place], [place] [address]      Time coordinating discharge: Over 30 minutes  SIGNED:   Little Ishikawa, DO [facility] 05/03/2022, 11:12 AM Pager   If 7PM-7AM, please contact night-coverage [email or web address]

## 2022-05-08 ENCOUNTER — Ambulatory Visit: Payer: Medicare (Managed Care) | Admitting: Physician Assistant

## 2022-06-22 ENCOUNTER — Ambulatory Visit (INDEPENDENT_AMBULATORY_CARE_PROVIDER_SITE_OTHER): Payer: Medicare (Managed Care) | Admitting: Neurology

## 2022-06-22 ENCOUNTER — Encounter: Payer: Self-pay | Admitting: Neurology

## 2022-06-22 VITALS — BP 131/87 | HR 111 | Ht 61.0 in | Wt 123.5 lb

## 2022-06-22 DIAGNOSIS — G40309 Generalized idiopathic epilepsy and epileptic syndromes, not intractable, without status epilepticus: Secondary | ICD-10-CM | POA: Diagnosis not present

## 2022-06-22 MED ORDER — TOPIRAMATE 25 MG PO TABS: 25.0000 mg | ORAL_TABLET | Freq: Two times a day (BID) | ORAL | 11 refills | Status: DC

## 2022-06-22 NOTE — Progress Notes (Signed)
[facility]  PATIENT: Oscar Elliott DOB: August 10, 1960  REQUESTING CLINICIAN: Charlsie Quest, MD HISTORY FROM: Chart review and caregiver  REASON FOR VISIT: Generalized seizure   HISTORICAL  CHIEF COMPLAINT:  Chief Complaint  Patient presents with   New Patient (Initial Visit)    Pt in 12 with caregiver  Caregiver states pt had seizure 2 months ago Pt has not had seizure since     HISTORY OF PRESENT ILLNESS:  This is a 62 a gentleman past medical history of schizophrenia, hyperlipidemia, diabetes, renal failure who is presenting after being admitted to the hospital for generalized epilepsy.  Patient is initially presented to the hospital and on February 28 for tremor and jerks. His initial EEG showed generalized discharges consistent with generalized epilepsy.  He did have a long-term EEG that captured a couple generalized seizures.  Initially he was put on Depakote but did have spontaneous subdural hemorrhage, low platelets, due to that Depakote has been discontinued to topiramate, renal dosage, 12.5 mg twice daily.  He has been doing well since leaving the hospital, caregiver has not reported any seizures.  She reports witnessing a seizure 3 years ago, again same generalized shaking or jerks and per chart review there was a possible seizure 15 years ago.  Currently he is doing well, has no complaints.  He is back to his normal self. History mainly obtained by chart review.   Handedness: Right handed   Onset: Possibly 3 years ago but was seen on EEG February 28  Seizure Type: Generalized seizure  Current frequency: Last 04 April 2018 when she was hospitalized prior to that 3 years ago.  There is a possibility of also patient had a seizure 15 years ago  Any injuries from seizures: Denies  Seizure risk factors: None reported he has a history of schizophrenia  Previous ASMs: None  Currenty ASMs: Topiramate 12.5 mg  ASMs side effects: Denies  Brain  Images: Generalized volume loss but repeat imaging showed subdural hematoma  Previous EEGs: Generalized spike consistent with generalized epilepsy   OTHER MEDICAL CONDITIONS: Schizophrenia, hypertension, hyperlipidemia, diabetes mellitus, CKD  REVIEW OF SYSTEMS: Full 14 system review of systems performed and negative with exception of: Unable to fully obtain due to mental status   ALLERGIES: Allergies  Allergen Reactions   Penicillins Other (See Comments)    DIZZINESS     HOME MEDICATIONS: Outpatient Medications Prior to Visit  Medication Sig Dispense Refill   acetaminophen (TYLENOL) 500 MG tablet Take 500 mg by mouth every 4 (four) hours as needed for moderate pain.     Cholecalciferol (VITAMIN D) 50 MCG (2000 UT) CAPS Take 2,000 Units by mouth once a week.      cloZAPine (CLOZARIL) 100 MG tablet 100 mg in AM and 500 mg at night (Patient taking differently: Take 100-500 mg by mouth See admin instructions. Take 100 mg by mouth in morning and 500 mg at night) 180 tablet 0   cyanocobalamin 1000 MCG tablet Take 1 tablet (1,000 mcg total) by mouth daily.     docusate sodium (COLACE) 100 MG capsule Take 100 mg by mouth 2 (two) times daily.     EASYMAX TEST test strip 1 each by Other route as needed.     feeding supplement (ENSURE ENLIVE / ENSURE PLUS) LIQD Take 237 mLs by mouth 2 (two) times daily with a meal. 237 mL 12   ketoconazole (NIZORAL) 2 % shampoo Apply 1 application topically 2 (two) times a week.  latanoprost (XALATAN) 0.005 % ophthalmic solution Place 1 drop into both eyes at bedtime.     linaclotide (LINZESS) 145 MCG CAPS capsule TAKE 1 CAPSULE BY MOUTH ONCE DAILY BEFORE BREAKFAST. (Patient taking differently: Take 145 mcg by mouth daily before breakfast.) 30 capsule 11   Multiple Vitamin (MULTIVITAMIN WITH MINERALS) TABS tablet Take 1 tablet by mouth daily. 30 tablet 2   omeprazole (PRILOSEC) 40 MG capsule Take 40 mg by mouth 2 (two) times daily.      polyethylene glycol  (MIRALAX / GLYCOLAX) 17 g packet Take 17 g by mouth daily as needed for moderate constipation.     tamsulosin (FLOMAX) 0.4 MG CAPS capsule Take 0.4 mg by mouth daily.     thiamine (VITAMIN B-1) 100 MG tablet Take 1 tablet (100 mg total) by mouth daily. 30 tablet 2   topiramate (TOPAMAX) 25 MG tablet Take 0.5 tablets (12.5 mg total) by mouth 2 (two) times daily. 30 tablet 0   No facility-administered medications prior to visit.    PAST MEDICAL HISTORY: Past Medical History:  Diagnosis Date   CAD (coronary artery disease)    a. 10/2013 Lateral STEMI/PCI: LM nl, LAD min irregs, D1 occluded sub branch, LCX min irregs mid, OM large/patent, RCA 179m (2.75x28 Promus DES).   Cholelithiasis    CKD (chronic kidney disease)    Essential hypertension    Hyperlipidemia    MI (myocardial infarction) (HCC)    Nephrolithiasis    Peripheral vascular disease (HCC)    a. 06/2007: s/p L AKA @ [facility]   Schizophrenia (HCC)    Type 2 diabetes mellitus (HCC)     PAST SURGICAL HISTORY: Past Surgical History:  Procedure Laterality Date   Above-the-knee amputation Left    COLONOSCOPY WITH PROPOFOL N/A 12/09/2018   fields: nonthrombosed hemorrhoids, decreased anal sphincter tone. poor prep, colonoscopy aborted   FLEXIBLE SIGMOIDOSCOPY  11/17/2019   Procedure: FLEXIBLE SIGMOIDOSCOPY;  Surgeon: Lanelle Bal, DO;  Location: [facility];  Service: Endoscopy;;   LEFT HEART CATHETERIZATION WITH CORONARY ANGIOGRAM N/A 11/20/2013   Procedure: LEFT HEART CATHETERIZATION WITH CORONARY ANGIOGRAM;  Surgeon: Corky Crafts, MD;  Location: [facility] CATH LAB;  Service: Cardiovascular;  Laterality: N/A;    FAMILY HISTORY: Family History  Problem Relation Age of Onset   Other Other        no premature CAD.   Seizures Neg Hx     SOCIAL HISTORY: Social History   Socioeconomic History   Marital status: Single    Spouse name: Not on file   Number of children: Not on file   Years of education: Not on file    Highest education level: Not on file  Occupational History   Not on file  Tobacco Use   Smoking status: Every Day    Packs/day: 1.00    Years: 20.00    Additional pack years: 0.00    Total pack years: 20.00    Types: Cigars, Cigarettes   Smokeless tobacco: Never   Tobacco comments:    1-2 cig daily  Vaping Use   Vaping Use: Never used  Substance and Sexual Activity   Alcohol use: No    Alcohol/week: 0.0 standard drinks of alcohol   Drug use: No   Sexual activity: Never  Other Topics Concern   Not on file  Social History Narrative   Single, no children      Currently living in a group home      Outpatient psychiatrist month Dr. Candace Cruise  Social Determinants of Health   Financial Resource Strain: Not on file  Food Insecurity: Not on file  Transportation Needs: Not on file  Physical Activity: Not on file  Stress: Not on file  Social Connections: Not on file  Intimate Partner Violence: Not on file    PHYSICAL EXAM  GENERAL EXAM/CONSTITUTIONAL: Vitals:  Vitals:   06/22/22 0836  BP: 131/87  Pulse: (!) 111  Weight: 123 lb 8 oz (56 kg)  Height: 5\' 1"  (1.549 m)   Body mass index is 23.34 kg/m. Wt Readings from Last 3 Encounters:  06/22/22 123 lb 8 oz (56 kg)  05/03/22 130 lb 1.1 oz (59 kg)  04/24/22 118 lb 6.2 oz (53.7 kg)   Patient is in no distress; well developed, nourished and groomed; neck is supple  MUSCULOSKELETAL: Gait, strength, tone, movements noted in Neurologic exam below  NEUROLOGIC: MENTAL STATUS:      No data to display         awake, alert, oriented to person, place and time recent and remote memory intact normal attention and concentration language fluent, comprehension intact, naming intact fund of knowledge appropriate  CRANIAL NERVE:  2nd, 3rd, 4th, 6th - Visual fields full to confrontation, extraocular muscles intact, no nystagmus 5th - facial sensation symmetric 7th - facial strength symmetric 8th - hearing intact 9th -  palate elevates symmetrically, uvula midline 11th - shoulder shrug symmetric 12th - tongue protrusion midline  MOTOR:  normal bulk and tone, full strength in the BUE, BLE  SENSORY:  normal and symmetric to light touch  COORDINATION:  finger-nose-finger, fine finger movements normal  GAIT/STATION:  normal     DIAGNOSTIC DATA (LABS, IMAGING, TESTING) - I reviewed patient records, labs, notes, testing and imaging myself where available.  Lab Results  Component Value Date   WBC 8.6 05/02/2022   HGB 7.4 (L) 05/02/2022   HCT 21.6 (L) 05/02/2022   MCV 85.7 05/02/2022   PLT 157 05/02/2022      Component Value Date/Time   NA 146 (H) 05/02/2022 0743   NA 144 01/30/2022 1419   NA 139 07/08/2013 1428   K 3.7 05/02/2022 0743   K 3.9 07/08/2013 1428   CL 117 (H) 05/02/2022 0743   CL 108 (H) 07/08/2013 1428   CO2 24 05/02/2022 0743   CO2 24 07/08/2013 1428   GLUCOSE 96 05/02/2022 0743   GLUCOSE 63 (L) 07/08/2013 1428   BUN 9 05/02/2022 0743   BUN 18 01/30/2022 1419   BUN 19 (H) 07/08/2013 1428   CREATININE 1.63 (H) 05/02/2022 0743   CREATININE 1.46 (H) 07/08/2013 1428   CALCIUM 7.9 (L) 05/02/2022 0743   CALCIUM 9.2 07/08/2013 1428   PROT 5.4 (L) 05/01/2022 0823   PROT 6.2 01/30/2022 1419   PROT 7.7 07/08/2013 1428   ALBUMIN 1.8 (L) 05/02/2022 0743   ALBUMIN 4.1 01/30/2022 1419   ALBUMIN 3.7 07/08/2013 1428   AST 28 05/01/2022 0823   AST 29 07/08/2013 1428   ALT 25 05/01/2022 0823   ALT 19 07/08/2013 1428   ALKPHOS 72 05/01/2022 0823   ALKPHOS 95 07/08/2013 1428   BILITOT 0.6 05/01/2022 0823   BILITOT 0.2 01/30/2022 1419   BILITOT 0.4 07/08/2013 1428   GFRNONAA 47 (L) 05/02/2022 0743   GFRNONAA 54 (L) 07/08/2013 1428   GFRAA >60 11/17/2019 1137   GFRAA >60 07/08/2013 1428   Lab Results  Component Value Date   CHOL 68 05/01/2022   HDL 18 (L) 05/01/2022  LDLCALC 37 05/01/2022   TRIG 64 05/01/2022   Lab Results  Component Value Date   HGBA1C 6.7 (H)  04/23/2022   Lab Results  Component Value Date   VITAMINB12 964 (H) 05/01/2022   Lab Results  Component Value Date   TSH 1.957 05/01/2022    Routine EEG 04/24/2022 EEG Abnormalities: 1) frequent generalized epileptiform discharges sometimes associated with myoclonus Clinical Interpretation: This EEG is consistent with an idiopathic generalized epilepsy with associated myoclonus  Rapid EEG 04/25/22 This limited ceribell EEG initially showed evidence of epileptogenicity with generalized onset and moderate diffuse encephalopathy. One seizure without clinical signs was noted at 1441,  appeared generalized and lateralized right hemisphere in onset, lasting 50 seconds. Second seizure was noted at 1533, was generalized tonic-clonic per staff, appeared generalized in onset, lasting 45 seconds. IV ativan was administered at 1541 after which eeg improved and was suggestive of mild to moderate diffuse encephalopathy.     ASSESSMENT AND PLAN  62 y.o. year old male  with schizophrenia, hyperlipidemia, hypertension, CKD who is presenting for follow-up after being admitted to hospital for generalized epilepsy.  He was started on topiramate 12.5 mg renal dose, denies any seizure or seizure activity since leaving the hospital.  Overall is doing well.  Plan will be to further increase the topiramate to 25 mg twice daily and repeat a EEG.  Advised caregiver to contact me if patient has a breakthrough seizure I will see them in 6 months for follow-up or sooner if worse    1. Generalized idiopathic epilepsy and epileptic syndromes, not intractable, without status epilepticus [facility])     Patient Instructions  Increase topiramate to 25 mg twice daily Repeat EEG Continue current medications Follow-up in 6 months or sooner if worse   Per [facility] statutes, patients with seizures are not allowed to drive until they have been seizure-free for six months.  Other recommendations include using caution  when using heavy equipment or power tools. Avoid working on ladders or at heights. Take showers instead of baths.  Do not swim alone.  Ensure the water temperature is not too high on the home water heater. Do not go swimming alone. Do not lock yourself in a room alone (i.e. bathroom). When caring for infants or small children, sit down when holding, feeding, or changing them to minimize risk of injury to the child in the event you have a seizure. Maintain good sleep hygiene. Avoid alcohol.  Also recommend adequate sleep, hydration, good diet and minimize stress.   During the Seizure  - First, ensure adequate ventilation and place patients on the floor on their left side  Loosen clothing around the neck and ensure the airway is patent. If the patient is clenching the teeth, do not force the mouth open with any object as this can cause severe damage - Remove all items from the surrounding that can be hazardous. The patient may be oblivious to what's happening and may not even know what he or she is doing. If the patient is confused and wandering, either gently guide him/her away and block access to outside areas - Reassure the individual and be comforting - Call 911. In most cases, the seizure ends before EMS arrives. However, there are cases when seizures may last over 3 to 5 minutes. Or the individual may have developed breathing difficulties or severe injuries. If a pregnant patient or a person with diabetes develops a seizure, it is prudent to call an ambulance. - Finally, if  the patient does not regain full consciousness, then call EMS. Most patients will remain confused for about 45 to 90 minutes after a seizure, so you must use judgment in calling for help. - Avoid restraints but make sure the patient is in a bed with padded side rails - Place the individual in a lateral position with the neck slightly flexed; this will help the saliva drain from the mouth and prevent the tongue from falling  backward - Remove all nearby furniture and other hazards from the area - Provide verbal assurance as the individual is regaining consciousness - Provide the patient with privacy if possible - Call for help and start treatment as ordered by the caregiver   After the Seizure (Postictal Stage)  After a seizure, most patients experience confusion, fatigue, muscle pain and/or a headache. Thus, one should permit the individual to sleep. For the next few days, reassurance is essential. Being calm and helping reorient the person is also of importance.  Most seizures are painless and end spontaneously. Seizures are not harmful to others but can lead to complications such as stress on the lungs, brain and the heart. Individuals with prior lung problems may develop labored breathing and respiratory distress.     Orders Placed This Encounter  Procedures   EEG adult    Meds ordered this encounter  Medications   topiramate (TOPAMAX) 25 MG tablet    Sig: Take 1 tablet (25 mg total) by mouth 2 (two) times daily.    Dispense:  60 tablet    Refill:  11    Return in about 6 months (around 12/22/2022).    Windell Norfolk, MD 06/22/2022, 9:39 AM  [facility] [address] [place], [place] [address] [phone]

## 2022-06-22 NOTE — Patient Instructions (Signed)
Increase topiramate to 25 mg twice daily Repeat EEG Continue current medications Follow-up in 6 months or sooner if worse

## 2022-07-05 ENCOUNTER — Ambulatory Visit (INDEPENDENT_AMBULATORY_CARE_PROVIDER_SITE_OTHER): Payer: Medicare (Managed Care) | Admitting: Neurology

## 2022-07-05 DIAGNOSIS — G40309 Generalized idiopathic epilepsy and epileptic syndromes, not intractable, without status epilepticus: Secondary | ICD-10-CM | POA: Diagnosis not present

## 2022-07-05 NOTE — Procedures (Signed)
    History:  62 year old man with seizure   EEG classification: Awake and drowsy  Description of the recording: The background rhythms of this recording consists of a fairly well modulated medium amplitude alpha rhythm of 9.5 Hz that is reactive to eye opening and closure. Present in the anterior head region is a 15-20 Hz beta activity. Photic stimulation was performed, did not show any abnormalities. Hyperventilation was also performed, did not show any abnormalities. Drowsiness was manifested by background fragmentation. No abnormal epileptiform discharges seen during this recording. There was no focal slowing. There were no electrographic seizure identified.   Abnormality: None   Impression: This is a normal EEG recorded while drowsy and awake. No evidence of interictal epileptiform discharges. Normal EEGs, however, do not rule out epilepsy.    Windell Norfolk, MD [facility]

## 2022-07-10 NOTE — Progress Notes (Signed)
Please call and inform patient that his recent EEG (Brain wave test) was normal. In particular, there were no epileptiform discharges and no seizures. No further action is required on this test at this time. Please keep any upcoming appointments or tests and  call us with any interim questions, concerns, problems or updates. Thanks,   Zavian Slowey, MD 

## 2022-07-23 ENCOUNTER — Other Ambulatory Visit: Payer: Self-pay | Admitting: Cardiology

## 2022-07-23 DIAGNOSIS — E7800 Pure hypercholesterolemia, unspecified: Secondary | ICD-10-CM

## 2022-08-10 ENCOUNTER — Other Ambulatory Visit: Payer: Self-pay | Admitting: Gastroenterology

## 2022-10-23 NOTE — Progress Notes (Signed)
Cardiology Office Note:  .   Date:  11/06/2022  ID:  Oscar Elliott, DOB 06-21-1960, MRN [id number] PCP: Anselm Jungling, NP   HeartCare Providers Cardiologist:  Nona Dell, MD Cardiology APP:  Dyann Kief, PA-C    History of Present Illness: .   Oscar Elliott is a 62 y.o. male  with past medical history of CAD/MI with prior history of DES to the RCA in 2015, hypertension, hyperlipidemia, PVD, DM2, Left AKA and chronic LE edema right leg.  Hospitalized 04/2022 with seizure disorder in the setting of severe sepsis.   Patient comes in with Aide from family care home he lives in. Main complaint is phantom pain. No chest pain, dyspnea, palpitations, edema. Walks with a walker and not very active. Smokes 1/2 ppd. He is no longer on ASA/Plavix-stopped 04/2022 for SDH, metoprolol or lisinopril- stopped in Feb due to low BP.   ROS:    Studies Reviewed: Marland Kitchen         Prior CV Studies:   Echo 03/2022 IMPRESSIONS     1. Left ventricular ejection fraction, by estimation, is 60 to 65%. The  left ventricle has normal function. The left ventricle has no regional  wall motion abnormalities. Left ventricular diastolic parameters are  indeterminate.   2. Right ventricular systolic function is normal. The right ventricular  size is normal.   3. A small pericardial effusion is present. The pericardial effusion is  circumferential.   4. The mitral valve is normal in structure. Mild mitral valve  regurgitation. No evidence of mitral stenosis.   5. The aortic valve is normal in structure. Aortic valve regurgitation is  not visualized. No aortic stenosis is present.   6. The inferior vena cava is normal in size with greater than 50%  respiratory variability, suggesting right atrial pressure of 3 mmHg.    Risk Assessment/Calculations:             Physical Exam:   VS:  BP 110/64   Pulse (!) 104   Ht 5\' 3"  (1.6 m)   Wt 130 lb 12.8 oz (59.3 kg)   SpO2 98%   BMI 23.17 kg/m     Wt Readings from Last 3 Encounters:  11/06/22 130 lb 12.8 oz (59.3 kg)  06/22/22 123 lb 8 oz (56 kg)  05/03/22 130 lb 1.1 oz (59 kg)    GEN: Well nourished, well developed in no acute distress NECK: No JVD; No carotid bruits CARDIAC: RRR, no murmurs, rubs, gallops RESPIRATORY:  decreased breath sounds but Clear to auscultation without rales, wheezing or rhonchi  ABDOMEN: Soft, non-tender, non-distended EXTREMITIES:  LBKA, right No edema; No deformity   ASSESSMENT AND PLAN: .    CAD in native artery -S/P MI with prior history of DES to the RCA in 2015 -no angina -off ASA/Plavix due to SDH 04/2022- -off metoprolol and lisinopril 03/2022 due to low BP. Pulse up today. Will restart low dose metoprolol 25 mg bid.  -echo 03/2022 normal LVEF    Mixed hyperlipidemia -LDL goal less than 70 -was 95 07/2022 -restart  atorvastatin 80 mg daily  -repeat FLP and LFT's in 3 months     Essential hypertension -BP reviewed from facility and running high 145-170/80's, HR 104. Restart metoprolol 25 mg bid -facility to send BP/P readings in 2 weeks.   SDH 2-04/2022 ASA and Plavix stopped  Tobacco abuse-smoking cessation discussed but unwilling to quit.           Dispo: f/u in 1  yr  Signed, [name], PA-C

## 2022-11-06 ENCOUNTER — Encounter: Payer: Self-pay | Admitting: Physician Assistant

## 2022-11-06 ENCOUNTER — Ambulatory Visit: Payer: Medicare (Managed Care) | Attending: Physician Assistant | Admitting: Physician Assistant

## 2022-11-06 VITALS — BP 110/64 | HR 104 | Ht 63.0 in | Wt 130.8 lb

## 2022-11-06 DIAGNOSIS — E785 Hyperlipidemia, unspecified: Secondary | ICD-10-CM

## 2022-11-06 DIAGNOSIS — I2510 Atherosclerotic heart disease of native coronary artery without angina pectoris: Secondary | ICD-10-CM

## 2022-11-06 DIAGNOSIS — I10 Essential (primary) hypertension: Secondary | ICD-10-CM

## 2022-11-06 DIAGNOSIS — S065XAA Traumatic subdural hemorrhage with loss of consciousness status unknown, initial encounter: Secondary | ICD-10-CM

## 2022-11-06 DIAGNOSIS — Z720 Tobacco use: Secondary | ICD-10-CM

## 2022-11-06 MED ORDER — ATORVASTATIN CALCIUM 80 MG PO TABS: 80.0000 mg | ORAL_TABLET | Freq: Every day | ORAL | 3 refills | Status: DC

## 2022-11-06 MED ORDER — METOPROLOL TARTRATE 25 MG PO TABS: 25.0000 mg | ORAL_TABLET | Freq: Two times a day (BID) | ORAL | 3 refills | Status: DC

## 2022-11-06 NOTE — Patient Instructions (Signed)
Medication Instructions:  Your physician has recommended you make the following change in your medication:   Restart Lipitor 80 mg Daily  Restart Lopressor 25 mg Two Times Daily   Please record blood pressure and heart rate for two weeks and then call to office  *If you need a refill on your cardiac medications before your next appointment, please call your pharmacy*   Lab Work: Your physician recommends that you return for lab work in: 3 months  ( Fasting )   If you have labs (blood work) drawn today and your tests are completely normal, you will receive your results only by: MyChart Message (if you have MyChart) OR A paper copy in the mail If you have any lab test that is abnormal or we need to change your treatment, we will call you to review the results.   Testing/Procedures: NONE    Follow-Up: At [facility], you and your health needs are our priority.  As part of our continuing mission to provide you with exceptional heart care, we have created designated Provider Care Teams.  These Care Teams include your primary Cardiologist (physician) and Advanced Practice Providers (APPs -  Physician Assistants and Nurse Practitioners) who all work together to provide you with the care you need, when you need it.  We recommend signing up for the patient portal called "MyChart".  Sign up information is provided on this After Visit Summary.  MyChart is used to connect with patients for Virtual Visits (Telemedicine).  Patients are able to view lab/test results, encounter notes, upcoming appointments, etc.  Non-urgent messages can be sent to your provider as well.   To learn more about what you can do with MyChart, go to [email or web address].    Your next appointment:   1 year(s)  Provider:   You may see Nona Dell, MD or one of the following Advanced Practice Providers on your designated Care Team:   Randall An, PA-C  [name], PA-C     Other  Instructions Thank you for choosing [facility]!

## 2022-12-26 ENCOUNTER — Ambulatory Visit (INDEPENDENT_AMBULATORY_CARE_PROVIDER_SITE_OTHER): Payer: Medicare (Managed Care) | Admitting: Neurology

## 2022-12-26 ENCOUNTER — Encounter: Payer: Self-pay | Admitting: Neurology

## 2022-12-26 VITALS — BP 131/85 | HR 89 | Resp 14 | Ht 63.0 in

## 2022-12-26 DIAGNOSIS — G40309 Generalized idiopathic epilepsy and epileptic syndromes, not intractable, without status epilepticus: Secondary | ICD-10-CM

## 2022-12-26 DIAGNOSIS — Z5181 Encounter for therapeutic drug level monitoring: Secondary | ICD-10-CM

## 2022-12-26 MED ORDER — TOPIRAMATE 25 MG PO TABS: 25.0000 mg | ORAL_TABLET | Freq: Two times a day (BID) | ORAL | 3 refills | Status: DC

## 2022-12-26 NOTE — Patient Instructions (Signed)
Continue with topiramate 25 mg by twice daily, refill given Continue other medication Follow-up in 1 year or sooner if worse.

## 2022-12-26 NOTE — Progress Notes (Signed)
[facility]  PATIENT: Oscar Elliott DOB: Dec 04, 1960  REQUESTING CLINICIAN: Anselm Jungling, NP HISTORY FROM: Chart review and caregiver  REASON FOR VISIT: Generalized seizure   HISTORICAL  CHIEF COMPLAINT:  Chief Complaint  Patient presents with   Seizures    Rm12, caregiver, last sz 6-7 months ago, no problems concerns   INTERVAL HISTORY 12/26/2022: Patient presents today for follow-up, he is accompanied by caregiver.  Last visit was in April, since then he has been doing well on topiramate 25 mg twice daily, denies any seizure or seizure activity.  His routine EEG was completed and it was normal. He also denies any side effect from the medication.  In term of the seizures, he is doing well, no complaint.     HISTORY OF PRESENT ILLNESS:  This is a 34 a gentleman past medical history of schizophrenia, hyperlipidemia, diabetes, renal failure who is presenting after being admitted to the hospital for generalized epilepsy.  Patient is initially presented to the hospital and on February 28 for tremor and jerks. His initial EEG showed generalized discharges consistent with generalized epilepsy.  He did have a long-term EEG that captured a couple generalized seizures.  Initially he was put on Depakote but did have spontaneous subdural hemorrhage, low platelets, due to that Depakote has been discontinued to topiramate, renal dosage, 12.5 mg twice daily.  He has been doing well since leaving the hospital, caregiver has not reported any seizures.  She reports witnessing a seizure 3 years ago, again same generalized shaking or jerks and per chart review there was a possible seizure 15 years ago.  Currently he is doing well, has no complaints.  He is back to his normal self. History mainly obtained by chart review.   Handedness: Right handed   Onset: Possibly 3 years ago but was seen on EEG February 28  Seizure Type: Generalized seizure  Current frequency: Last sz in  February when she was hospitalized prior to that 3 years ago.  There is a possibility of also patient had a seizure 15 years ago  Any injuries from seizures: Denies  Seizure risk factors: None reported he has a history of schizophrenia  Previous ASMs: None  Currenty ASMs: Topiramate 25 mg  ASMs side effects: Denies  Brain Images: Generalized volume loss but repeat imaging showed subdural hematoma  Previous EEGs: Generalized spike consistent with generalized epilepsy   OTHER MEDICAL CONDITIONS: Schizophrenia, hypertension, hyperlipidemia, diabetes mellitus, CKD  REVIEW OF SYSTEMS: Full 14 system review of systems performed and negative with exception of: Unable to fully obtain due to mental status   ALLERGIES: Allergies  Allergen Reactions   Penicillins Other (See Comments)    DIZZINESS     HOME MEDICATIONS: Outpatient Medications Prior to Visit  Medication Sig Dispense Refill   acetaminophen (TYLENOL) 500 MG tablet Take 500 mg by mouth every 4 (four) hours as needed for moderate pain.     atorvastatin (LIPITOR) 80 MG tablet Take 1 tablet (80 mg total) by mouth daily. 90 tablet 3   atropine 1 % ophthalmic solution Place 1-2 drops under the tongue every 4 (four) hours as needed (secretions).     Cholecalciferol (VITAMIN D) 50 MCG (2000 UT) CAPS Take 2,000 Units by mouth daily at 12 noon.     cloZAPine (CLOZARIL) 100 MG tablet 100 mg in AM and 500 mg at night (Patient taking differently: Take 100-500 mg by mouth See admin instructions. Take 100 mg by mouth in morning and 500 mg at  night) 180 tablet 0   cyanocobalamin 1000 MCG tablet Take 1 tablet (1,000 mcg total) by mouth daily.     docusate sodium (COLACE) 100 MG capsule Take 100 mg by mouth 2 (two) times daily.     EASYMAX TEST test strip 1 each by Other route as needed.     feeding supplement (ENSURE ENLIVE / ENSURE PLUS) LIQD Take 237 mLs by mouth 2 (two) times daily with a meal. (Patient taking differently: Take 237 mLs by  mouth 2 (two) times daily as needed.) 237 mL 12   gabapentin (NEURONTIN) 100 MG capsule Take 100 mg by mouth 3 (three) times daily.     Insulin Degludec (TRESIBA) 100 UNIT/ML SOLN Inject 33 Units into the skin at bedtime.     ketoconazole (NIZORAL) 2 % shampoo Apply 1 application topically 2 (two) times a week.     latanoprost (XALATAN) 0.005 % ophthalmic solution Place 1 drop into both eyes at bedtime.     LINZESS 145 MCG CAPS capsule TAKE 1 CAPSULE BY MOUTH ONCE DAILY BEFORE BREAKFAST. 30 capsule 10   metoprolol tartrate (LOPRESSOR) 25 MG tablet Take 1 tablet (25 mg total) by mouth 2 (two) times daily. 180 tablet 3   Multiple Vitamin (MULTIVITAMIN WITH MINERALS) TABS tablet Take 1 tablet by mouth daily. 30 tablet 2   nitroGLYCERIN (NITROSTAT) 0.4 MG SL tablet Place 0.4 mg under the tongue every 5 (five) minutes x 3 doses as needed (if no relief after 3rd dose, procee to ED or call 911).     NOVOLOG 100 UNIT/ML injection Inject 2 Units into the skin 3 (three) times daily as needed for high blood sugar (before meals).     omeprazole (PRILOSEC) 40 MG capsule Take 40 mg by mouth 2 (two) times daily.      polyethylene glycol (MIRALAX / GLYCOLAX) 17 g packet Take 17 g by mouth daily as needed for moderate constipation.     tamsulosin (FLOMAX) 0.4 MG CAPS capsule Take 0.4 mg by mouth daily.     thiamine (VITAMIN B-1) 100 MG tablet Take 1 tablet (100 mg total) by mouth daily. 30 tablet 2   traZODone (DESYREL) 50 MG tablet Take 50 mg by mouth at bedtime.     topiramate (TOPAMAX) 25 MG tablet Take 1 tablet (25 mg total) by mouth 2 (two) times daily. 60 tablet 11   No facility-administered medications prior to visit.    PAST MEDICAL HISTORY: Past Medical History:  Diagnosis Date   CAD (coronary artery disease)    a. 10/2013 Lateral STEMI/PCI: LM nl, LAD min irregs, D1 occluded sub branch, LCX min irregs mid, OM large/patent, RCA 152m (2.75x28 Promus DES).   Cholelithiasis    CKD (chronic kidney  disease)    Essential hypertension    Hyperlipidemia    MI (myocardial infarction) (HCC)    Nephrolithiasis    Peripheral vascular disease (HCC)    a. 06/2007: s/p L AKA @ [facility]   Schizophrenia (HCC)    Type 2 diabetes mellitus (HCC)     PAST SURGICAL HISTORY: Past Surgical History:  Procedure Laterality Date   Above-the-knee amputation Left    COLONOSCOPY WITH PROPOFOL N/A 12/09/2018   fields: nonthrombosed hemorrhoids, decreased anal sphincter tone. poor prep, colonoscopy aborted   FLEXIBLE SIGMOIDOSCOPY  11/17/2019   Procedure: FLEXIBLE SIGMOIDOSCOPY;  Surgeon: Lanelle Bal, DO;  Location: [facility];  Service: Endoscopy;;   LEFT HEART CATHETERIZATION WITH CORONARY ANGIOGRAM N/A 11/20/2013   Procedure: LEFT HEART CATHETERIZATION  WITH CORONARY ANGIOGRAM;  Surgeon: Corky Crafts, MD;  Location: [facility] CATH LAB;  Service: Cardiovascular;  Laterality: N/A;    FAMILY HISTORY: Family History  Problem Relation Age of Onset   Other Other        no premature CAD.   Seizures Neg Hx     SOCIAL HISTORY: Social History   Socioeconomic History   Marital status: Single    Spouse name: Not on file   Number of children: Not on file   Years of education: Not on file   Highest education level: Not on file  Occupational History   Not on file  Tobacco Use   Smoking status: Every Day    Current packs/day: 1.00    Average packs/day: 1 pack/day for 20.0 years (20.0 ttl pk-yrs)    Types: Cigars, Cigarettes   Smokeless tobacco: Never   Tobacco comments:    1-2 cig daily  Vaping Use   Vaping status: Never Used  Substance and Sexual Activity   Alcohol use: No    Alcohol/week: 0.0 standard drinks of alcohol   Drug use: No   Sexual activity: Never  Other Topics Concern   Not on file  Social History Narrative   Single, no children      Currently living in a group home      Outpatient psychiatrist month Dr. Candace Cruise    Social Determinants of Health   Financial Resource  Strain: Not on file  Food Insecurity: Not on file  Transportation Needs: Not on file  Physical Activity: Not on file  Stress: Not on file  Social Connections: Not on file  Intimate Partner Violence: Not on file    PHYSICAL EXAM  GENERAL EXAM/CONSTITUTIONAL: Vitals:  Vitals:   12/26/22 1007  BP: 131/85  Pulse: 89  Resp: 14  Height: 5\' 3"  (1.6 m)   Body mass index is 23.17 kg/m. Wt Readings from Last 3 Encounters:  11/06/22 130 lb 12.8 oz (59.3 kg)  06/22/22 123 lb 8 oz (56 kg)  05/03/22 130 lb 1.1 oz (59 kg)   Patient is in no distress; well developed, nourished and groomed; neck is supple  MUSCULOSKELETAL: Gait, strength, tone, movements noted in Neurologic exam below  NEUROLOGIC: MENTAL STATUS:      No data to display         awake, alert, oriented to person, place but not date recent and remote memory intact Able to tell me the number of quarter in $1 but not $1.75 language fluent, comprehension intact, naming intact fund of knowledge appropriate  CRANIAL NERVE:  2nd, 3rd, 4th, 6th - Visual fields full to confrontation, extraocular muscles intact, no nystagmus 5th - facial sensation symmetric 7th - facial strength symmetric 8th - hearing intact 9th - palate elevates symmetrically, uvula midline 11th - shoulder shrug symmetric 12th - tongue protrusion midline  MOTOR:  normal bulk and tone, full strength in the BUE, and RLE. He has an AKA on the left   SENSORY:  normal and symmetric to light touch  COORDINATION:  finger-nose-finger, fine finger movements normal  GAIT/STATION:  With a  walker    DIAGNOSTIC DATA (LABS, IMAGING, TESTING) - I reviewed patient records, labs, notes, testing and imaging myself where available.  Lab Results  Component Value Date   WBC 8.6 05/02/2022   HGB 7.4 (L) 05/02/2022   HCT 21.6 (L) 05/02/2022   MCV 85.7 05/02/2022   PLT 157 05/02/2022      Component Value Date/Time  NA 146 (H) 05/02/2022 0743   NA 144  01/30/2022 1419   NA 139 07/08/2013 1428   K 3.7 05/02/2022 0743   K 3.9 07/08/2013 1428   CL 117 (H) 05/02/2022 0743   CL 108 (H) 07/08/2013 1428   CO2 24 05/02/2022 0743   CO2 24 07/08/2013 1428   GLUCOSE 96 05/02/2022 0743   GLUCOSE 63 (L) 07/08/2013 1428   BUN 9 05/02/2022 0743   BUN 18 01/30/2022 1419   BUN 19 (H) 07/08/2013 1428   CREATININE 1.63 (H) 05/02/2022 0743   CREATININE 1.46 (H) 07/08/2013 1428   CALCIUM 7.9 (L) 05/02/2022 0743   CALCIUM 9.2 07/08/2013 1428   PROT 5.4 (L) 05/01/2022 0823   PROT 6.2 01/30/2022 1419   PROT 7.7 07/08/2013 1428   ALBUMIN 1.8 (L) 05/02/2022 0743   ALBUMIN 4.1 01/30/2022 1419   ALBUMIN 3.7 07/08/2013 1428   AST 28 05/01/2022 0823   AST 29 07/08/2013 1428   ALT 25 05/01/2022 0823   ALT 19 07/08/2013 1428   ALKPHOS 72 05/01/2022 0823   ALKPHOS 95 07/08/2013 1428   BILITOT 0.6 05/01/2022 0823   BILITOT 0.2 01/30/2022 1419   BILITOT 0.4 07/08/2013 1428   GFRNONAA 47 (L) 05/02/2022 0743   GFRNONAA 54 (L) 07/08/2013 1428   GFRAA >60 11/17/2019 1137   GFRAA >60 07/08/2013 1428   Lab Results  Component Value Date   CHOL 68 05/01/2022   HDL 18 (L) 05/01/2022   LDLCALC 37 05/01/2022   TRIG 64 05/01/2022   Lab Results  Component Value Date   HGBA1C 6.7 (H) 04/23/2022   Lab Results  Component Value Date   VITAMINB12 964 (H) 05/01/2022   Lab Results  Component Value Date   TSH 1.957 05/01/2022    Routine EEG 04/24/2022 EEG Abnormalities: 1) frequent generalized epileptiform discharges sometimes associated with myoclonus Clinical Interpretation: This EEG is consistent with an idiopathic generalized epilepsy with associated myoclonus  Rapid EEG 04/25/22 This limited ceribell EEG initially showed evidence of epileptogenicity with generalized onset and moderate diffuse encephalopathy. One seizure without clinical signs was noted at 1441,  appeared generalized and lateralized right hemisphere in onset, lasting 50 seconds. Second  seizure was noted at 1533, was generalized tonic-clonic per staff, appeared generalized in onset, lasting 45 seconds. IV ativan was administered at 1541 after which eeg improved and was suggestive of mild to moderate diffuse encephalopathy.   Routine EEG 07/05/2022: Normal    ASSESSMENT AND PLAN  62 y.o. year old male  with schizophrenia, hyperlipidemia, hypertension, CKD and seizure who is presenting for follow-up.  He is doing well on topiramate 25 mg twice daily, denies any seizure or seizure-like activity.  He also denies any side effect from the medication.  Plan will be to continue patient on topiramate 25 mg twice daily and I will see him in a year for follow-up.  Will also obtain a topiramate level today and CMP.  Follow-up sooner if worse.   1. Generalized idiopathic epilepsy and epileptic syndromes, not intractable, without status epilepticus (HCC)   2. Therapeutic drug monitoring     Patient Instructions  Continue with topiramate 25 mg by twice daily, refill given Continue other medication Follow-up in 1 year or sooner if worse.   Per [facility] statutes, patients with seizures are not allowed to drive until they have been seizure-free for six months.  Other recommendations include using caution when using heavy equipment or power tools. Avoid working on ladders or at  heights. Take showers instead of baths.  Do not swim alone.  Ensure the water temperature is not too high on the home water heater. Do not go swimming alone. Do not lock yourself in a room alone (i.e. bathroom). When caring for infants or small children, sit down when holding, feeding, or changing them to minimize risk of injury to the child in the event you have a seizure. Maintain good sleep hygiene. Avoid alcohol.  Also recommend adequate sleep, hydration, good diet and minimize stress.   During the Seizure  - First, ensure adequate ventilation and place patients on the floor on their left side  Loosen  clothing around the neck and ensure the airway is patent. If the patient is clenching the teeth, do not force the mouth open with any object as this can cause severe damage - Remove all items from the surrounding that can be hazardous. The patient may be oblivious to what's happening and may not even know what he or she is doing. If the patient is confused and wandering, either gently guide him/her away and block access to outside areas - Reassure the individual and be comforting - Call 911. In most cases, the seizure ends before EMS arrives. However, there are cases when seizures may last over 3 to 5 minutes. Or the individual may have developed breathing difficulties or severe injuries. If a pregnant patient or a person with diabetes develops a seizure, it is prudent to call an ambulance. - Finally, if the patient does not regain full consciousness, then call EMS. Most patients will remain confused for about 45 to 90 minutes after a seizure, so you must use judgment in calling for help. - Avoid restraints but make sure the patient is in a bed with padded side rails - Place the individual in a lateral position with the neck slightly flexed; this will help the saliva drain from the mouth and prevent the tongue from falling backward - Remove all nearby furniture and other hazards from the area - Provide verbal assurance as the individual is regaining consciousness - Provide the patient with privacy if possible - Call for help and start treatment as ordered by the caregiver   After the Seizure (Postictal Stage)  After a seizure, most patients experience confusion, fatigue, muscle pain and/or a headache. Thus, one should permit the individual to sleep. For the next few days, reassurance is essential. Being calm and helping reorient the person is also of importance.  Most seizures are painless and end spontaneously. Seizures are not harmful to others but can lead to complications such as stress on the  lungs, brain and the heart. Individuals with prior lung problems may develop labored breathing and respiratory distress.     Orders Placed This Encounter  Procedures   CMP   Topiramate Level    Meds ordered this encounter  Medications   topiramate (TOPAMAX) 25 MG tablet    Sig: Take 1 tablet (25 mg total) by mouth 2 (two) times daily.    Dispense:  180 tablet    Refill:  3    Return in about 1 year (around 12/26/2023).    Windell Norfolk, MD 12/26/2022, 10:36 AM  [facility] [address] [place], [place] [address] [phone]

## 2022-12-27 LAB — COMPREHENSIVE METABOLIC PANEL
ALT: 16 [IU]/L (ref 0–44)
AST: 19 [IU]/L (ref 0–40)
Albumin: 4.3 g/dL (ref 3.9–4.9)
Alkaline Phosphatase: 113 [IU]/L (ref 44–121)
BUN/Creatinine Ratio: 11 (ref 10–24)
BUN: 17 mg/dL (ref 8–27)
Bilirubin Total: 0.3 mg/dL (ref 0.0–1.2)
CO2: 19 mmol/L — ABNORMAL LOW (ref 20–29)
Calcium: 9.6 mg/dL (ref 8.6–10.2)
Chloride: 107 mmol/L — ABNORMAL HIGH (ref 96–106)
Creatinine, Ser: 1.53 mg/dL — ABNORMAL HIGH (ref 0.76–1.27)
Globulin, Total: 2.7 g/dL (ref 1.5–4.5)
Glucose: 259 mg/dL — ABNORMAL HIGH (ref 70–99)
Potassium: 4.2 mmol/L (ref 3.5–5.2)
Sodium: 142 mmol/L (ref 134–144)
Total Protein: 7 g/dL (ref 6.0–8.5)
eGFR: 51 mL/min/{1.73_m2} — ABNORMAL LOW (ref >59)

## 2022-12-27 LAB — TOPIRAMATE LEVEL: Topiramate Lvl: 2.5 ug/mL (ref 2.0–25.0)

## 2022-12-28 NOTE — Progress Notes (Signed)
Please call and advise the patient that the recent labs we checked showed a normal topiramate level, but elevated glucose and his kidney function is stable. No further action is required on these tests at this time. Please remind patient to keep any upcoming appointments or tests and to call us with any interim questions, concerns, problems or updates. Thanks,   Windell Norfolk, MD

## 2023-04-21 ENCOUNTER — Other Ambulatory Visit: Payer: Self-pay

## 2023-04-21 ENCOUNTER — Emergency Department
Admission: EM | Admit: 2023-04-21 | Discharge: 2023-04-21 | Disposition: A | Discharge status: Home / Self Care | Payer: Medicare Other | Attending: Emergency Medicine | Admitting: Emergency Medicine

## 2023-04-21 DIAGNOSIS — B9689 Other specified bacterial agents as the cause of diseases classified elsewhere: Secondary | ICD-10-CM | POA: Insufficient documentation

## 2023-04-21 DIAGNOSIS — N189 Chronic kidney disease, unspecified: Secondary | ICD-10-CM | POA: Diagnosis not present

## 2023-04-21 DIAGNOSIS — E119 Type 2 diabetes mellitus without complications: Secondary | ICD-10-CM | POA: Diagnosis not present

## 2023-04-21 DIAGNOSIS — I2510 Atherosclerotic heart disease of native coronary artery without angina pectoris: Secondary | ICD-10-CM | POA: Diagnosis not present

## 2023-04-21 DIAGNOSIS — K5909 Other constipation: Secondary | ICD-10-CM | POA: Diagnosis not present

## 2023-04-21 DIAGNOSIS — R1084 Generalized abdominal pain: Secondary | ICD-10-CM

## 2023-04-21 DIAGNOSIS — N390 Urinary tract infection, site not specified: Secondary | ICD-10-CM | POA: Insufficient documentation

## 2023-04-21 LAB — CBC
HCT: 35.9 % — ABNORMAL LOW (ref 39.0–52.0)
Hemoglobin: 11.8 g/dL — ABNORMAL LOW (ref 13.0–17.0)
MCH: 29.6 pg (ref 26.0–34.0)
MCHC: 32.9 g/dL (ref 30.0–36.0)
MCV: 90.2 fL (ref 80.0–100.0)
Platelets: 115 10*3/uL — ABNORMAL LOW (ref 150–400)
RBC: 3.98 MIL/uL — ABNORMAL LOW (ref 4.22–5.81)
RDW: 15.5 % (ref 11.5–15.5)
WBC: 6.4 10*3/uL (ref 4.0–10.5)
nRBC: 0 % (ref 0.0–0.2)

## 2023-04-21 LAB — COMPREHENSIVE METABOLIC PANEL
ALT: 15 U/L (ref 0–44)
AST: 21 U/L (ref 15–41)
Albumin: 3.4 g/dL — ABNORMAL LOW (ref 3.5–5.0)
Alkaline Phosphatase: 80 U/L (ref 38–126)
Anion gap: 10 (ref 5–15)
BUN: 16 mg/dL (ref 8–23)
CO2: 19 mmol/L — ABNORMAL LOW (ref 22–32)
Calcium: 8.8 mg/dL — ABNORMAL LOW (ref 8.9–10.3)
Chloride: 113 mmol/L — ABNORMAL HIGH (ref 98–111)
Creatinine, Ser: 1.6 mg/dL — ABNORMAL HIGH (ref 0.61–1.24)
GFR, Estimated: 48 mL/min — ABNORMAL LOW (ref >60)
Glucose, Bld: 113 mg/dL — ABNORMAL HIGH (ref 70–99)
Potassium: 3.8 mmol/L (ref 3.5–5.1)
Sodium: 142 mmol/L (ref 135–145)
Total Bilirubin: 0.5 mg/dL (ref 0.0–1.2)
Total Protein: 6.4 g/dL — ABNORMAL LOW (ref 6.5–8.1)

## 2023-04-21 LAB — URINALYSIS, ROUTINE W REFLEX MICROSCOPIC
Bilirubin Urine: NEGATIVE
Glucose, UA: NEGATIVE mg/dL
Hgb urine dipstick: NEGATIVE
Ketones, ur: NEGATIVE mg/dL
Nitrite: NEGATIVE
Protein, ur: NEGATIVE mg/dL
Specific Gravity, Urine: 1.011 (ref 1.005–1.030)
WBC, UA: >50 WBC/hpf (ref 0–5)
pH: 5 (ref 5.0–8.0)

## 2023-04-21 LAB — LIPASE, BLOOD: Lipase: 28 U/L (ref 11–51)

## 2023-04-21 MED ORDER — POLYETHYLENE GLYCOL 3350 17 G PO PACK: 17.0000 g | PACK | Freq: Every day | ORAL | 10 refills | Status: DC | PRN

## 2023-04-21 MED ORDER — CEFUROXIME AXETIL 250 MG PO TABS: 250.0000 mg | ORAL_TABLET | Freq: Two times a day (BID) | ORAL | 0 refills | Status: DC

## 2023-04-21 MED ORDER — POLYETHYLENE GLYCOL 3350 17 G PO PACK: 17.0000 g | PACK | Freq: Every day | ORAL | 10 refills | Status: AC | PRN

## 2023-04-21 MED ORDER — CEFUROXIME AXETIL 250 MG PO TABS
250.0000 mg | ORAL_TABLET | Freq: Once | ORAL | Status: AC
  Administered 2023-04-21: 250 mg via ORAL
  Filled 2023-04-21: qty 1

## 2023-04-21 NOTE — ED Triage Notes (Addendum)
 Pt comes via EMS from [facility] with ongoing belly pain. Pt has hx of constipation for year and abnormal BM. Pt states headache and pain in leg. Pt is BKA on left.   Pt denies any N/V. Pt states pain all over belly.

## 2023-04-21 NOTE — ED Provider Notes (Signed)
 [facility] Provider Note   Event Date/Time   First MD Initiated Contact with Patient 04/21/23 1832     (approximate) History  Abdominal Pain  HPI Oscar Elliott is a 63 y.o. male with a past medical history of type 2 diabetes, CAD, schizophrenia, CKD, chronic constipation and a left above-the-knee amputation who presents complaining of generalized abdominal pain as well as dysuria.  Patient states that he is also been having difficulty with bowel movements which is not uncommon for him.  Patient denies any recent travel or sick contacts.  Patient denies any nausea/vomiting/diarrhea.  Patient denies any subjective fevers. ROS: Patient currently denies any vision changes, tinnitus, difficulty speaking, facial droop, sore throat, chest pain, shortness of breath, nausea/vomiting/diarrhea   Physical Exam  Triage Vital Signs: ED Triage Vitals  Encounter Vitals Group     BP 04/21/23 1608 (!) 135/94     Systolic BP Percentile --      Diastolic BP Percentile --      Pulse Rate 04/21/23 1608 88     Resp 04/21/23 1608 18     Temp 04/21/23 1608 97.6 F (36.4 C)     Temp src --      SpO2 04/21/23 1608 100 %     Weight --      Height --      Head Circumference --      Peak Flow --      Pain Score 04/21/23 1550 8     Pain Loc --      Pain Education --      Exclude from Growth Chart --    Most recent vital signs: Vitals:   04/21/23 1608  BP: (!) 135/94  Pulse: 88  Resp: 18  Temp: 97.6 F (36.4 C)  SpO2: 100%   General: Awake, oriented x4. CV:  Good peripheral perfusion.  Resp:  Normal effort.  Abd:  No distention.  Other:  Left AKA.  Middle-age well-developed, well-nourished African-American male resting comfortably in no acute distress ED Results / Procedures / Treatments  Labs (all labs ordered are listed, but only abnormal results are displayed) Labs Reviewed  COMPREHENSIVE METABOLIC PANEL - Abnormal; Notable for the following components:       Result Value   Chloride 113 (*)    CO2 19 (*)    Glucose, Bld 113 (*)    Creatinine, Ser 1.60 (*)    Calcium 8.8 (*)    Total Protein 6.4 (*)    Albumin 3.4 (*)    GFR, Estimated 48 (*)    All other components within normal limits  CBC - Abnormal; Notable for the following components:   RBC 3.98 (*)    Hemoglobin 11.8 (*)    HCT 35.9 (*)    Platelets 115 (*)    All other components within normal limits  URINALYSIS, ROUTINE W REFLEX MICROSCOPIC - Abnormal; Notable for the following components:   Color, Urine YELLOW (*)    APPearance HAZY (*)    Leukocytes,Ua MODERATE (*)    Bacteria, UA MANY (*)    All other components within normal limits  LIPASE, BLOOD   PROCEDURES: Critical Care performed: No Procedures MEDICATIONS ORDERED IN ED: Medications  cefUROXime (CEFTIN) tablet 250 mg (has no administration in time range)   IMPRESSION / MDM / ASSESSMENT AND PLAN / ED COURSE  I reviewed the triage vital signs and the nursing notes.  The patient is on the cardiac monitor to evaluate for evidence of arrhythmia and/or significant heart rate changes. Patient's presentation is most consistent with acute presentation with potential threat to life or bodily function. No e/o epididymo-orchitis on exam and low suspicion for rectal abscess, prostatitis, other GU deep space infection, gonorrhea/chlamydia. Unlikely Infected Urolithiasis, AAA, cholecystitis, pancreatitis, SBO, appendicitis, or other acute abdomen. Workup: UA: None Rx: Ceftin 250 mg twice daily x5 days Patient currently on Linzess MiraLAX.  Patient further instructions on using MiraLAX and a refill on these medications Disposition: Discharge home. SRP discussed. Advise follow up with primary care provider within 24-72 hours.   FINAL CLINICAL IMPRESSION(S) / ED DIAGNOSES   Final diagnoses:  Generalized abdominal pain  Urinary tract infection without hematuria, site unspecified  Chronic  constipation   Rx / DC Orders   ED Discharge Orders          Ordered    cefUROXime (CEFTIN) 250 MG tablet  2 times daily with meals        04/21/23 1837    polyethylene glycol (MIRALAX / GLYCOLAX) 17 g packet  Daily PRN        04/21/23 1838           Note:  This document was prepared using Dragon voice recognition software and may include unintentional dictation errors.   Merwyn Katos, MD 04/21/23 [phone]

## 2023-07-11 ENCOUNTER — Other Ambulatory Visit: Payer: Self-pay | Admitting: Internal Medicine

## 2023-07-12 NOTE — Telephone Encounter (Signed)
 We have not seen patient in over 3 years. Cannot provide refills.

## 2023-09-04 ENCOUNTER — Other Ambulatory Visit: Payer: Self-pay

## 2023-09-04 ENCOUNTER — Emergency Department

## 2023-09-04 ENCOUNTER — Inpatient Hospital Stay

## 2023-09-04 ENCOUNTER — Inpatient Hospital Stay
Admission: EM | Admit: 2023-09-04 | Discharge: 2023-09-06 | DRG: 269 | Disposition: A | Discharge status: Home / Self Care | Source: Skilled Nursing Facility | Attending: Internal Medicine | Admitting: Internal Medicine

## 2023-09-04 DIAGNOSIS — R1084 Generalized abdominal pain: Secondary | ICD-10-CM | POA: Diagnosis not present

## 2023-09-04 DIAGNOSIS — F17210 Nicotine dependence, cigarettes, uncomplicated: Secondary | ICD-10-CM | POA: Diagnosis present

## 2023-09-04 DIAGNOSIS — Z79899 Other long term (current) drug therapy: Secondary | ICD-10-CM

## 2023-09-04 DIAGNOSIS — F209 Schizophrenia, unspecified: Secondary | ICD-10-CM | POA: Diagnosis present

## 2023-09-04 DIAGNOSIS — N1831 Chronic kidney disease, stage 3a: Secondary | ICD-10-CM | POA: Diagnosis present

## 2023-09-04 DIAGNOSIS — Z880 Allergy status to penicillin: Secondary | ICD-10-CM

## 2023-09-04 DIAGNOSIS — Z7982 Long term (current) use of aspirin: Secondary | ICD-10-CM

## 2023-09-04 DIAGNOSIS — Z89612 Acquired absence of left leg above knee: Secondary | ICD-10-CM

## 2023-09-04 DIAGNOSIS — G8929 Other chronic pain: Secondary | ICD-10-CM | POA: Diagnosis present

## 2023-09-04 DIAGNOSIS — Z955 Presence of coronary angioplasty implant and graft: Secondary | ICD-10-CM

## 2023-09-04 DIAGNOSIS — I9789 Other postprocedural complications and disorders of the circulatory system, not elsewhere classified: Secondary | ICD-10-CM | POA: Diagnosis not present

## 2023-09-04 DIAGNOSIS — I129 Hypertensive chronic kidney disease with stage 1 through stage 4 chronic kidney disease, or unspecified chronic kidney disease: Secondary | ICD-10-CM | POA: Diagnosis present

## 2023-09-04 DIAGNOSIS — I723 Aneurysm of iliac artery: Secondary | ICD-10-CM | POA: Diagnosis present

## 2023-09-04 DIAGNOSIS — I7143 Infrarenal abdominal aortic aneurysm, without rupture: Secondary | ICD-10-CM | POA: Diagnosis not present

## 2023-09-04 DIAGNOSIS — E785 Hyperlipidemia, unspecified: Secondary | ICD-10-CM | POA: Diagnosis present

## 2023-09-04 DIAGNOSIS — Z7984 Long term (current) use of oral hypoglycemic drugs: Secondary | ICD-10-CM

## 2023-09-04 DIAGNOSIS — R160 Hepatomegaly, not elsewhere classified: Secondary | ICD-10-CM | POA: Diagnosis present

## 2023-09-04 DIAGNOSIS — I252 Old myocardial infarction: Secondary | ICD-10-CM | POA: Diagnosis not present

## 2023-09-04 DIAGNOSIS — Y832 Surgical operation with anastomosis, bypass or graft as the cause of abnormal reaction of the patient, or of later complication, without mention of misadventure at the time of the procedure: Secondary | ICD-10-CM | POA: Diagnosis not present

## 2023-09-04 DIAGNOSIS — Z794 Long term (current) use of insulin: Secondary | ICD-10-CM

## 2023-09-04 DIAGNOSIS — I714 Abdominal aortic aneurysm, without rupture, unspecified: Secondary | ICD-10-CM

## 2023-09-04 DIAGNOSIS — R109 Unspecified abdominal pain: Secondary | ICD-10-CM | POA: Diagnosis present

## 2023-09-04 DIAGNOSIS — F17290 Nicotine dependence, other tobacco product, uncomplicated: Secondary | ICD-10-CM | POA: Diagnosis present

## 2023-09-04 DIAGNOSIS — E1151 Type 2 diabetes mellitus with diabetic peripheral angiopathy without gangrene: Secondary | ICD-10-CM | POA: Diagnosis present

## 2023-09-04 DIAGNOSIS — T82330A Leakage of aortic (bifurcation) graft (replacement), initial encounter: Secondary | ICD-10-CM | POA: Diagnosis not present

## 2023-09-04 DIAGNOSIS — I2510 Atherosclerotic heart disease of native coronary artery without angina pectoris: Secondary | ICD-10-CM | POA: Diagnosis present

## 2023-09-04 DIAGNOSIS — Z720 Tobacco use: Secondary | ICD-10-CM

## 2023-09-04 DIAGNOSIS — I70202 Unspecified atherosclerosis of native arteries of extremities, left leg: Secondary | ICD-10-CM | POA: Diagnosis not present

## 2023-09-04 DIAGNOSIS — E1122 Type 2 diabetes mellitus with diabetic chronic kidney disease: Secondary | ICD-10-CM | POA: Diagnosis present

## 2023-09-04 DIAGNOSIS — Z7902 Long term (current) use of antithrombotics/antiplatelets: Secondary | ICD-10-CM | POA: Diagnosis not present

## 2023-09-04 DIAGNOSIS — N3000 Acute cystitis without hematuria: Secondary | ICD-10-CM

## 2023-09-04 LAB — COMPREHENSIVE METABOLIC PANEL WITH GFR
ALT: 20 U/L (ref 0–44)
AST: 27 U/L (ref 15–41)
Albumin: 3.7 g/dL (ref 3.5–5.0)
Alkaline Phosphatase: 78 U/L (ref 38–126)
Anion gap: 9 (ref 5–15)
BUN: 17 mg/dL (ref 8–23)
CO2: 23 mmol/L (ref 22–32)
Calcium: 9.1 mg/dL (ref 8.9–10.3)
Chloride: 110 mmol/L (ref 98–111)
Creatinine, Ser: 1.61 mg/dL — ABNORMAL HIGH (ref 0.61–1.24)
GFR, Estimated: 48 mL/min — ABNORMAL LOW (ref >60)
Glucose, Bld: 82 mg/dL (ref 70–99)
Potassium: 3.8 mmol/L (ref 3.5–5.1)
Sodium: 142 mmol/L (ref 135–145)
Total Bilirubin: 0.9 mg/dL (ref 0.0–1.2)
Total Protein: 6.7 g/dL (ref 6.5–8.1)

## 2023-09-04 LAB — CBC WITH DIFFERENTIAL/PLATELET
Abs Immature Granulocytes: 0.03 K/uL (ref 0.00–0.07)
Basophils Absolute: 0 K/uL (ref 0.0–0.1)
Basophils Relative: 0 %
Eosinophils Absolute: 0 K/uL (ref 0.0–0.5)
Eosinophils Relative: 0 %
HCT: 38.3 % — ABNORMAL LOW (ref 39.0–52.0)
Hemoglobin: 13 g/dL (ref 13.0–17.0)
Immature Granulocytes: 0 %
Lymphocytes Relative: 25 %
Lymphs Abs: 1.7 K/uL (ref 0.7–4.0)
MCH: 30.2 pg (ref 26.0–34.0)
MCHC: 33.9 g/dL (ref 30.0–36.0)
MCV: 88.9 fL (ref 80.0–100.0)
Monocytes Absolute: 0.5 K/uL (ref 0.1–1.0)
Monocytes Relative: 7 %
Neutro Abs: 4.6 K/uL (ref 1.7–7.7)
Neutrophils Relative %: 68 %
Platelets: 111 K/uL — ABNORMAL LOW (ref 150–400)
RBC: 4.31 MIL/uL (ref 4.22–5.81)
RDW: 14.7 % (ref 11.5–15.5)
WBC: 6.9 K/uL (ref 4.0–10.5)
nRBC: 0 % (ref 0.0–0.2)

## 2023-09-04 LAB — CBG MONITORING, ED
Glucose-Capillary: 81 mg/dL (ref 70–99)
Glucose-Capillary: 92 mg/dL (ref 70–99)
Glucose-Capillary: 120 mg/dL — ABNORMAL HIGH (ref 70–99)

## 2023-09-04 LAB — GLUCOSE, CAPILLARY: Glucose-Capillary: 159 mg/dL — ABNORMAL HIGH (ref 70–99)

## 2023-09-04 LAB — URINALYSIS, W/ REFLEX TO CULTURE (INFECTION SUSPECTED)
Bilirubin Urine: NEGATIVE
Glucose, UA: NEGATIVE mg/dL
Hgb urine dipstick: NEGATIVE
Ketones, ur: NEGATIVE mg/dL
Nitrite: NEGATIVE
Protein, ur: NEGATIVE mg/dL
Specific Gravity, Urine: 1.011 (ref 1.005–1.030)
pH: 6 (ref 5.0–8.0)

## 2023-09-04 LAB — TYPE AND SCREEN
ABO/RH(D): O POS
Antibody Screen: NEGATIVE

## 2023-09-04 LAB — LIPASE, BLOOD: Lipase: 25 U/L (ref 11–51)

## 2023-09-04 MED ORDER — POLYETHYLENE GLYCOL 3350 17 G PO PACK: 17.0000 g | PACK | Freq: Every day | ORAL | Status: DC | PRN

## 2023-09-04 MED ORDER — INSULIN ASPART 100 UNIT/ML IJ SOLN
0.0000 [IU] | Freq: Three times a day (TID) | INTRAMUSCULAR | Status: DC
  Administered 2023-09-06: 2 [IU] via SUBCUTANEOUS
  Administered 2023-09-06: 1 [IU] via SUBCUTANEOUS
  Filled 2023-09-04: qty 2
  Filled 2023-09-04: qty 1

## 2023-09-04 MED ORDER — GABAPENTIN 100 MG PO CAPS
100.0000 mg | ORAL_CAPSULE | Freq: Three times a day (TID) | ORAL | Status: DC
  Administered 2023-09-04 – 2023-09-06 (×6): 100 mg via ORAL
  Filled 2023-09-04 (×6): qty 1

## 2023-09-04 MED ORDER — MUPIROCIN 2 % EX OINT
1.0000 | TOPICAL_OINTMENT | Freq: Two times a day (BID) | CUTANEOUS | Status: DC
Start: 2023-09-05 — End: 2023-09-06
  Administered 2023-09-05 – 2023-09-06 (×3): 1 via NASAL
  Filled 2023-09-04: qty 22

## 2023-09-04 MED ORDER — CHLORHEXIDINE GLUCONATE 4 % EX SOLN
60.0000 mL | Freq: Once | CUTANEOUS | Status: AC
  Administered 2023-09-04: 4 via TOPICAL

## 2023-09-04 MED ORDER — ENSURE ENLIVE PO LIQD
237.0000 mL | Freq: Two times a day (BID) | ORAL | Status: DC
  Administered 2023-09-04 – 2023-09-06 (×2): 237 mL via ORAL
  Filled 2023-09-04: qty 237

## 2023-09-04 MED ORDER — ONDANSETRON HCL 4 MG/2ML IJ SOLN: 4.0000 mg | Freq: Four times a day (QID) | INTRAMUSCULAR | Status: DC | PRN

## 2023-09-04 MED ORDER — ACETAMINOPHEN 500 MG PO TABS
500.0000 mg | ORAL_TABLET | ORAL | Status: DC | PRN
  Administered 2023-09-05 – 2023-09-06 (×3): 500 mg via ORAL
  Filled 2023-09-04 (×3): qty 1

## 2023-09-04 MED ORDER — CLOZAPINE 100 MG PO TABS: 100.0000 mg | ORAL_TABLET | ORAL | Status: DC

## 2023-09-04 MED ORDER — ONDANSETRON HCL 4 MG PO TABS: 4.0000 mg | ORAL_TABLET | Freq: Four times a day (QID) | ORAL | Status: DC | PRN

## 2023-09-04 MED ORDER — HYDROMORPHONE HCL 1 MG/ML IJ SOLN
0.5000 mg | INTRAMUSCULAR | Status: DC | PRN
  Administered 2023-09-05: 1 mg via INTRAVENOUS
  Filled 2023-09-04: qty 1

## 2023-09-04 MED ORDER — LINACLOTIDE 145 MCG PO CAPS
145.0000 ug | ORAL_CAPSULE | Freq: Every day | ORAL | Status: DC
  Administered 2023-09-05 – 2023-09-06 (×2): 145 ug via ORAL
  Filled 2023-09-04 (×2): qty 1

## 2023-09-04 MED ORDER — DOCUSATE SODIUM 100 MG PO CAPS
100.0000 mg | ORAL_CAPSULE | Freq: Two times a day (BID) | ORAL | Status: DC
  Administered 2023-09-04 – 2023-09-06 (×5): 100 mg via ORAL
  Filled 2023-09-04 (×5): qty 1

## 2023-09-04 MED ORDER — TOPIRAMATE 25 MG PO TABS
25.0000 mg | ORAL_TABLET | Freq: Two times a day (BID) | ORAL | Status: DC
  Administered 2023-09-04 – 2023-09-06 (×4): 25 mg via ORAL
  Filled 2023-09-04 (×4): qty 1

## 2023-09-04 MED ORDER — LATANOPROST 0.005 % OP SOLN
1.0000 [drp] | Freq: Every day | OPHTHALMIC | Status: DC
  Administered 2023-09-04 – 2023-09-05 (×2): 1 [drp] via OPHTHALMIC
  Filled 2023-09-04: qty 2.5

## 2023-09-04 MED ORDER — IOHEXOL 300 MG/ML  SOLN
100.0000 mL | Freq: Once | INTRAMUSCULAR | Status: AC | PRN
  Administered 2023-09-04: 80 mL via INTRAVENOUS

## 2023-09-04 MED ORDER — ONDANSETRON HCL 4 MG/2ML IJ SOLN
4.0000 mg | Freq: Once | INTRAMUSCULAR | Status: AC
  Administered 2023-09-04: 4 mg via INTRAVENOUS
  Filled 2023-09-04: qty 2

## 2023-09-04 MED ORDER — LABETALOL HCL 5 MG/ML IV SOLN: 10.0000 mg | INTRAVENOUS | Status: DC | PRN

## 2023-09-04 MED ORDER — METOPROLOL TARTRATE 25 MG PO TABS
25.0000 mg | ORAL_TABLET | Freq: Two times a day (BID) | ORAL | Status: DC
  Administered 2023-09-04 – 2023-09-06 (×5): 25 mg via ORAL
  Filled 2023-09-04 (×5): qty 1

## 2023-09-04 MED ORDER — ATORVASTATIN CALCIUM 80 MG PO TABS
80.0000 mg | ORAL_TABLET | Freq: Every day | ORAL | Status: DC
  Administered 2023-09-04 – 2023-09-05 (×2): 80 mg via ORAL
  Filled 2023-09-04: qty 4
  Filled 2023-09-04: qty 1

## 2023-09-04 MED ORDER — SODIUM CHLORIDE 0.9 % IV BOLUS
1000.0000 mL | Freq: Once | INTRAVENOUS | Status: AC
  Administered 2023-09-04: 1000 mL via INTRAVENOUS

## 2023-09-04 MED ORDER — CHLORHEXIDINE GLUCONATE 4 % EX SOLN
60.0000 mL | Freq: Once | CUTANEOUS | Status: AC
  Administered 2023-09-05: 4 via TOPICAL

## 2023-09-04 MED ORDER — CLOZAPINE 100 MG PO TABS
100.0000 mg | ORAL_TABLET | Freq: Every day | ORAL | Status: DC
  Administered 2023-09-05 – 2023-09-06 (×2): 100 mg via ORAL
  Filled 2023-09-04 (×2): qty 1

## 2023-09-04 MED ORDER — SODIUM CHLORIDE 0.9 % IV SOLN
1.0000 g | Freq: Once | INTRAVENOUS | Status: AC
  Administered 2023-09-04: 1 g via INTRAVENOUS
  Filled 2023-09-04: qty 10

## 2023-09-04 MED ORDER — TAMSULOSIN HCL 0.4 MG PO CAPS
0.4000 mg | ORAL_CAPSULE | Freq: Every day | ORAL | Status: DC
  Administered 2023-09-05 – 2023-09-06 (×2): 0.4 mg via ORAL
  Filled 2023-09-04 (×3): qty 1

## 2023-09-04 MED ORDER — CLOZAPINE 100 MG PO TABS
500.0000 mg | ORAL_TABLET | Freq: Every day | ORAL | Status: DC
  Administered 2023-09-04 – 2023-09-05 (×2): 500 mg via ORAL
  Filled 2023-09-04 (×2): qty 5

## 2023-09-04 MED ORDER — PANTOPRAZOLE SODIUM 40 MG PO TBEC
40.0000 mg | DELAYED_RELEASE_TABLET | Freq: Every day | ORAL | Status: DC
  Administered 2023-09-04 – 2023-09-06 (×3): 40 mg via ORAL
  Filled 2023-09-04 (×3): qty 1

## 2023-09-04 MED ORDER — TRAZODONE HCL 50 MG PO TABS
50.0000 mg | ORAL_TABLET | Freq: Every day | ORAL | Status: DC
  Administered 2023-09-04 – 2023-09-05 (×2): 50 mg via ORAL
  Filled 2023-09-04 (×2): qty 1

## 2023-09-04 NOTE — ED Triage Notes (Signed)
 EMS reports they were called out for patient feeling weak; BG 60 on arrival. Was given oral glucose and BG 69.

## 2023-09-04 NOTE — H&P (Signed)
 History and Physical    Oscar Elliott FMW:[id number] DOB: 05-12-1960 DOA: 09/04/2023  PCP: Gwenith Shuck, NP (Confirm with patient/family/NH records and if not entered, this has to be entered at [facility] point of entry) Patient coming from: Assisted living  I have personally briefly reviewed patient's old medical records in [facility]  Chief Complaint: Belly pain  HPI: Oscar Elliott is a 63 y.o. male with medical history significant of schizophrenia, HTN, HLD, IDDM, PVD status post left AKA, CKD stage IIIa, sent from his [facility] for evaluation of worsening of abdominal pain.  Patient reported that he has been having worsening of a chronic abdominal pain this morning.  For the last 3+ months patient has had occasional global abdominal pain, sharp-like worsening with eating, denied any nauseous vomiting or diarrhea.  He usually takes pain medication and the pain subside on its own.  This morning, he started to have severe abdominal pain, sharp like, located above umbilical area.  Denied any nausea vomiting diarrhea no fever or chills.  The facility checked patient glucose which was low and EMS was called.  ED Course: Afebrile, blood pressure 120/90 O2 saturation 100% room air.  CT abdominal pelvis showed large fusiform aneurysm dilation of the infrarenal aorta measuring 9 cm in craniocaudal length and 6.6 cm in diameter.  Incidental finding of liver mass.  Glucose 82 BUN 17 creatinine 1.6 bicarb 23.  Review of Systems: As per HPI otherwise 14 point review of systems negative.    Past Medical History:  Diagnosis Date   CAD (coronary artery disease)    a. 10/2013 Lateral STEMI/PCI: LM nl, LAD min irregs, D1 occluded sub branch, LCX min irregs mid, OM large/patent, RCA 16m (2.75x28 Promus DES).   Cholelithiasis    CKD (chronic kidney disease)    Essential hypertension    Hyperlipidemia    MI (myocardial infarction) (HCC)    Nephrolithiasis    Peripheral vascular disease (HCC)    a.  06/2007: s/p L AKA @ [facility]   Schizophrenia (HCC)    Type 2 diabetes mellitus (HCC)     Past Surgical History:  Procedure Laterality Date   Above-the-knee amputation Left    COLONOSCOPY WITH PROPOFOL  N/A 12/09/2018   fields: nonthrombosed hemorrhoids, decreased anal sphincter tone. poor prep, colonoscopy aborted   FLEXIBLE SIGMOIDOSCOPY  11/17/2019   Procedure: FLEXIBLE SIGMOIDOSCOPY;  Surgeon: Cindie Carlin POUR, DO;  Location: [facility];  Service: Endoscopy;;   LEFT HEART CATHETERIZATION WITH CORONARY ANGIOGRAM N/A 11/20/2013   Procedure: LEFT HEART CATHETERIZATION WITH CORONARY ANGIOGRAM;  Surgeon: Candyce GORMAN Reek, MD;  Location: [facility] CATH LAB;  Service: Cardiovascular;  Laterality: N/A;     reports that he has been smoking cigars and cigarettes. He has a 20 pack-year smoking history. He has never used smokeless tobacco. He reports that he does not drink alcohol  and does not use drugs.  Allergies  Allergen Reactions   Penicillins Other (See Comments)    DIZZINESS     Family History  Problem Relation Age of Onset   Other Other        no premature CAD.   Seizures Neg Hx      Prior to Admission medications   Medication Sig Start Date End Date Taking? Authorizing Provider  acetaminophen  (TYLENOL ) 500 MG tablet Take 500 mg by mouth every 4 (four) hours as needed for moderate pain.    [provider]  atorvastatin  (LIPITOR ) 80 MG tablet Take 1 tablet (80 mg total) by mouth daily. 11/06/22  02/04/23  Parthenia Olivia HERO, PA-C  atropine 1 % ophthalmic solution Place 1-2 drops under the tongue every 4 (four) hours as needed (secretions).    [provider]  cefUROXime  (CEFTIN ) 250 MG tablet Take 1 tablet (250 mg total) by mouth 2 (two) times daily with a meal. 04/21/23   Margrette, Myah A, PA-C  Cholecalciferol (VITAMIN D ) 50 MCG (2000 UT) CAPS Take 2,000 Units by mouth daily at 12 noon.    [provider]  cloZAPine  (CLOZARIL ) 100 MG tablet 100 mg in AM and 500 mg  at night Patient taking differently: Take 100-500 mg by mouth See admin instructions. Take 100 mg by mouth in morning and 500 mg at night 03/26/19   Vincente Murrain, MD  cyanocobalamin  1000 MCG tablet Take 1 tablet (1,000 mcg total) by mouth daily. 04/26/22   Von Bellis, MD  docusate sodium  (COLACE) 100 MG capsule Take 100 mg by mouth 2 (two) times daily.    [provider]  EASYMAX TEST test strip 1 each by Other route as needed. 07/06/20   [provider]  feeding supplement (ENSURE ENLIVE / ENSURE PLUS) LIQD Take 237 mLs by mouth 2 (two) times daily with a meal. Patient taking differently: Take 237 mLs by mouth 2 (two) times daily as needed. 05/03/22   Lue Elsie BROCKS, MD  gabapentin  (NEURONTIN ) 100 MG capsule Take 100 mg by mouth 3 (three) times daily. 10/22/22   [provider]  Insulin  Degludec (TRESIBA) 100 UNIT/ML SOLN Inject 33 Units into the skin at bedtime.    [provider]  ketoconazole (NIZORAL) 2 % shampoo Apply 1 application topically 2 (two) times a week.    [provider]  latanoprost  (XALATAN ) 0.005 % ophthalmic solution Place 1 drop into both eyes at bedtime.    [provider]  LINZESS  145 MCG CAPS capsule TAKE 1 CAPSULE BY MOUTH ONCE DAILY BEFORE BREAKFAST. 08/10/22   Rourk, Lamar HERO, MD  metoprolol  tartrate (LOPRESSOR ) 25 MG tablet Take 1 tablet (25 mg total) by mouth 2 (two) times daily. 11/06/22 02/04/23  Parthenia Olivia HERO, PA-C  Multiple Vitamin (MULTIVITAMIN WITH MINERALS) TABS tablet Take 1 tablet by mouth daily. 05/04/22   Lue Elsie BROCKS, MD  nitroGLYCERIN  (NITROSTAT ) 0.4 MG SL tablet Place 0.4 mg under the tongue every 5 (five) minutes x 3 doses as needed (if no relief after 3rd dose, procee to ED or call 911). 06/28/22   [provider]  NOVOLOG  100 UNIT/ML injection Inject 2 Units into the skin 3 (three) times daily as needed for high blood sugar (before meals). 10/15/22   [provider]   omeprazole (PRILOSEC) 40 MG capsule Take 40 mg by mouth 2 (two) times daily.     [provider]  polyethylene glycol (MIRALAX  / GLYCOLAX ) 17 g packet Take 17 g by mouth daily as needed for moderate constipation. 04/21/23   Margrette, Myah A, PA-C  tamsulosin  (FLOMAX ) 0.4 MG CAPS capsule Take 0.4 mg by mouth daily.    [provider]  thiamine  (VITAMIN B-1) 100 MG tablet Take 1 tablet (100 mg total) by mouth daily. 05/04/22   Lue Elsie BROCKS, MD  topiramate  (TOPAMAX ) 25 MG tablet Take 1 tablet (25 mg total) by mouth 2 (two) times daily. 12/26/22 12/21/23  Camara, Amadou, MD  traZODone  (DESYREL ) 50 MG tablet Take 50 mg by mouth at bedtime.    [provider]    Physical Exam: Vitals:   09/04/23 1000 09/04/23 1130 09/04/23  1230 09/04/23 1330  BP: 120/79 (!) 129/92 (!) 135/91 (!) 142/128  Pulse: 73 79 79 79  Resp: 18 (!) 24 (!) 0 (!) 33  Temp:      TempSrc:      SpO2: 99% 100% 100% 100%  Weight:        Constitutional: NAD, calm, comfortable Vitals:   09/04/23 1000 09/04/23 1130 09/04/23 1230 09/04/23 1330  BP: 120/79 (!) 129/92 (!) 135/91 (!) 142/128  Pulse: 73 79 79 79  Resp: 18 (!) 24 (!) 0 (!) 33  Temp:      TempSrc:      SpO2: 99% 100% 100% 100%  Weight:       Eyes: PERRL, lids and conjunctivae normal ENMT: Mucous membranes are moist. Posterior pharynx clear of any exudate or lesions.Normal dentition.  Neck: normal, supple, no masses, no thyromegaly Respiratory: clear to auscultation bilaterally, no wheezing, no crackles. Normal respiratory effort. No accessory muscle use.  Cardiovascular: Regular rate and rhythm, no murmurs / rubs / gallops. No extremity edema. 2+ pedal pulses. No carotid bruits.  Abdomen: mild tenderness on periumbilical area, no rebound no guarding, no masses palpated. No hepatosplenomegaly. Bowel sounds positive.  Musculoskeletal: no clubbing / cyanosis. No joint deformity upper and lower extremities. Good ROM, no contractures.  Normal muscle tone.  Skin: no rashes, lesions, ulcers. No induration Neurologic: CN 2-12 grossly intact. Sensation intact, DTR normal. Strength 5/5 in all 4.  Psychiatric: Normal judgment and insight. Alert and oriented x 3. Normal mood.     Labs on Admission: I have personally reviewed following labs and imaging studies  CBC: Recent Labs  Lab 09/04/23 0916  WBC 6.9  NEUTROABS 4.6  HGB 13.0  HCT 38.3*  MCV 88.9  PLT 111*   Basic Metabolic Panel: Recent Labs  Lab 09/04/23 0916  NA 142  K 3.8  CL 110  CO2 23  GLUCOSE 82  BUN 17  CREATININE 1.61*  CALCIUM  9.1   GFR: CrCl cannot be calculated (Unknown ideal weight.). Liver Function Tests: Recent Labs  Lab 09/04/23 0916  AST 27  ALT 20  ALKPHOS 78  BILITOT 0.9  PROT 6.7  ALBUMIN 3.7   Recent Labs  Lab 09/04/23 0916  LIPASE 25   No results for input(s): AMMONIA in the last 168 hours. Coagulation Profile: No results for input(s): INR, PROTIME in the last 168 hours. Cardiac Enzymes: No results for input(s): CKTOTAL, CKMB, CKMBINDEX, TROPONINI in the last 168 hours. BNP (last 3 results) No results for input(s): PROBNP in the last 8760 hours. HbA1C: No results for input(s): HGBA1C in the last 72 hours. CBG: Recent Labs  Lab 09/04/23 0912 09/04/23 1014  GLUCAP 81 92   Lipid Profile: No results for input(s): CHOL, HDL, LDLCALC, TRIG, CHOLHDL, LDLDIRECT in the last 72 hours. Thyroid Function Tests: No results for input(s): TSH, T4TOTAL, FREET4, T3FREE, THYROIDAB in the last 72 hours. Anemia Panel: No results for input(s): VITAMINB12, FOLATE, FERRITIN, TIBC, IRON, RETICCTPCT in the last 72 hours. Urine analysis:    Component Value Date/Time   COLORURINE YELLOW (A) 09/04/2023 0920   APPEARANCEUR HAZY (A) 09/04/2023 0920   APPEARANCEUR Clear 07/08/2013 1606   LABSPEC 1.011 09/04/2023 0920   LABSPEC 1.010 07/08/2013 1606   PHURINE 6.0 09/04/2023 0920    GLUCOSEU NEGATIVE 09/04/2023 0920   GLUCOSEU Negative 07/08/2013 1606   HGBUR NEGATIVE 09/04/2023 0920   BILIRUBINUR NEGATIVE 09/04/2023 0920   BILIRUBINUR Negative 07/08/2013 1606   KETONESUR NEGATIVE 09/04/2023 0920  PROTEINUR NEGATIVE 09/04/2023 0920   UROBILINOGEN 0.2 02/22/2014 1853   NITRITE NEGATIVE 09/04/2023 0920   LEUKOCYTESUR MODERATE (A) 09/04/2023 0920   LEUKOCYTESUR 3+ 07/08/2013 1606    Radiological Exams on Admission: CT Head Wo Contrast Result Date: 09/04/2023 CLINICAL DATA:  Mental status change, unknown cause. EXAM: CT HEAD WITHOUT CONTRAST TECHNIQUE: Contiguous axial images were obtained from the base of the skull through the vertex without intravenous contrast. RADIATION DOSE REDUCTION: This exam was performed according to the departmental dose-optimization program which includes automated exposure control, adjustment of the mA and/or kV according to patient size and/or use of iterative reconstruction technique. COMPARISON:  CT scan head from 04/29/2022. FINDINGS: Brain: No evidence of acute infarction, hemorrhage, hydrocephalus, extra-axial collection or mass lesion/mass effect. Ventricles are normal. Cerebral volume is age appropriate. Vascular: No hyperdense vessel or unexpected calcification. Skull: Normal. Negative for fracture or focal lesion. Sinuses/Orbits: No acute finding. Other: Visualized mastoid air cells are unremarkable. No mastoid effusion. IMPRESSION: *No acute intracranial abnormality. Electronically Signed   By: Ree Molt M.D.   On: 09/04/2023 10:45   CT ABDOMEN PELVIS W CONTRAST Result Date: 09/04/2023 CLINICAL DATA:  Abdominal pain, acute, nonlocalized.  Weakness. EXAM: CT ABDOMEN AND PELVIS WITH CONTRAST TECHNIQUE: Multidetector CT imaging of the abdomen and pelvis was performed using the standard protocol following bolus administration of intravenous contrast. RADIATION DOSE REDUCTION: This exam was performed according to the departmental  dose-optimization program which includes automated exposure control, adjustment of the mA and/or kV according to patient size and/or use of iterative reconstruction technique. CONTRAST:  80mL OMNIPAQUE  IOHEXOL  300 MG/ML  SOLN COMPARISON:  CT scan abdomen and pelvis from 02/23/2014. FINDINGS: Lower chest: There are patchy atelectatic changes in the visualized lung bases. No overt consolidation. No pleural effusion. The heart is normal in size. No pericardial effusion. Hepatobiliary: The liver is normal in size. Non-cirrhotic configuration. There is an ill-defined approximately 8 x 11 mm hypoattenuating lesion in the right hepatic dome, segment 8, which is incompletely characterized on the current exam. The lesion is likely new since the prior study from 2015. If prior imaging is available, comparison can be made. Otherwise, consider evaluation with ultrasound versus multiphasic contrast-enhanced MRI or CT scan abdomen as per hepatic mass protocol. No intrahepatic or extrahepatic bile duct dilation. Small volume dependent sub 5 mm calcified gallstones noted without imaging signs of acute cholecystitis. Normal gallbladder wall thickness. No pericholecystic inflammatory changes. Pancreas: Unremarkable. No pancreatic ductal dilatation or surrounding inflammatory changes. Spleen: Within normal limits. No focal lesion. Adrenals/Urinary Tract: Adrenal glands are unremarkable. No suspicious renal mass. There are at least 3 nonobstructing calculi in the left kidney with largest in the lower pole measuring 6 x 11 mm. There are at least 3, 1-2 mm nonobstructing calculi in the right kidney. No obstructive uropathy on either side. Unremarkable urinary bladder. Stomach/Bowel: No disproportionate dilation of the small or large bowel loops. No evidence of abnormal bowel wall thickening or inflammatory changes. The appendix is unremarkable. Vascular/Lymphatic: No ascites or pneumoperitoneum. No abdominal or pelvic lymphadenopathy, by  size criteria. There is fusiform aneurysmal dilation of infrarenal aorta measuring up to 9 cm in craniocaudal length. The maximum diameter of aneurysmal sac is up to 6.6 cm. There is large intraluminal thrombus/hematoma. No periaortic fat stranding. 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 There is also moderate intraluminal thrombus/hematoma. There are mild peripheral atherosclerotic vascular calcifications of the aorta and its major  branches. Reproductive: Normal size prostate. Symmetric seminal vesicles. Other: There is a tiny fat containing umbilical hernia. The soft tissues and abdominal wall are otherwise unremarkable. Musculoskeletal: No suspicious osseous lesions. There are mild - moderate multilevel degenerative changes in the visualized spine. 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 IMPRESSION: 1. There is fusiform aneurysmal dilation of infrarenal aorta measuring up to 9 cm in craniocaudal length and 6.6 cm in diameter. 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 Recommend referral to a vascular specialist. 2. There is an ill-defined approximately 8 x 11 mm hypoattenuating lesion in the right hepatic dome, segment 8, which is incompletely characterized on the current exam. If prior imaging is available, comparison can be made. Otherwise, consider evaluation with ultrasound versus multiphasic contrast-enhanced MRI or CT scan abdomen as per hepatic mass protocol. 3. Multiple other nonacute observations, as described above. Aortic aneurysm NOS (ICD10-I71.9). Aortic Atherosclerosis (ICD10-I70.0). Electronically Signed   By: Ree Molt M.D.   On: 09/04/2023 10:42    EKG: Independently reviewed.  Sinus rhythm, no acute ST-T changes.  Assessment/Plan Principal Problem:   AAA (abdominal aortic aneurysm) (HCC) Active Problems:   Abdominal pain  (please  populate well all problems here in Problem List. (For example, if patient is on BP meds at home and you resume or decide to hold them, it is a problem that needs to be her. Same for CAD, COPD, HLD and so on)  Acute on chronic abdominal pain Large infrarenal aorta aneurysm - Requiring aneurysm repair.  Vascular surgeon is to contact patient's legal guardian for consent. - Strict control of blood pressure, continue metoprolol , add as needed labetalol  for breakthrough HTN. - Hold off chemical DVT prophylaxis  Hypoglycemia IDDM - Secondary to poor intake from ongoing abdominal pain.  Hold off Lantus and standing dose of Humalog - SSI for now  Incidental finding of liver mass - Normal LFTs - As per recommendation from radiology, RUQ ultrasound ordered - Outpatient follow-up for MRI  HTN - As above  CKD stage III - Euvolemic and already level stable.  History of schizophrenia - Mentation at baseline - Continue Abilify , clozapine  and Cymbalta  and Neurontin   DVT prophylaxis: SCD Code Status: Full code Family Communication: None at bedside Disposition Plan: Patient is sick with large intra-abdominal aortic aneurysm requiring repair, expect more than 2 midnight hospitalist Consults called: Vascular surgeon Admission status: Tele admit   Cort ONEIDA Mana MD [facility] Pager [phone]  09/04/2023, 2:10 PM

## 2023-09-04 NOTE — ED Notes (Signed)
 Provided Pt with urinal, emptied and gave a new pair of underwear.

## 2023-09-04 NOTE — ED Provider Notes (Signed)
 [facility] Provider Note    Event Date/Time   First MD Initiated Contact with Patient 09/04/23 [phone]     (approximate)   History   Hypoglycemia   HPI  Nell Schrack is a 63 year old male with history of T2DM, CAD presenting to the emergency department for evaluation of weakness.  Patient reports he has felt generally weak over the past few days.  Staff report that he has not been eating, had been receiving insulin  until this morning where it was held as he is not eating.  Patient tells me that he feels nauseous, denies vomiting.  Reports abdominal pain, but says this is not new for him.  Reports ongoing dysuria for years.  Initial BGL 60 with EMS, given oral glucose.      Physical Exam   Triage Vital Signs: ED Triage Vitals  Encounter Vitals Group     BP 09/04/23 0914 (!) 129/95     Girls Systolic BP Percentile --      Girls Diastolic BP Percentile --      Boys Systolic BP Percentile --      Boys Diastolic BP Percentile --      Pulse Rate 09/04/23 0914 85     Resp 09/04/23 0914 18     Temp 09/04/23 0914 98.2 F (36.8 C)     Temp Source 09/04/23 0914 Oral     SpO2 09/04/23 0914 99 %     Weight 09/04/23 0913 118 lb 11.2 oz (53.8 kg)     Height --      Head Circumference --      Peak Flow --      Pain Score 09/04/23 0912 0     Pain Loc --      Pain Education --      Exclude from Growth Chart --     Most recent vital signs: Vitals:   09/04/23 1130 09/04/23 1230  BP: (!) 129/92 (!) 135/91  Pulse: 79 79  Resp: (!) 24 (!) 0  Temp:    SpO2: 100% 100%     General: Awake, interactive  CV:  Regular rate, good peripheral perfusion.  Resp:  Unlabored respirations, lungs clear to auscultation Abd:  Nondistended, soft, mild generalized tenderness without rebound or guarding Neuro:  Symmetric facial movement, fluid speech, 5 out of 5 strength of bilateral upper extremities.  Left AKA present, 5-5 strength with hip flexion bilaterally.   ED  Results / Procedures / Treatments   Labs (all labs ordered are listed, but only abnormal results are displayed) Labs Reviewed  CBC WITH DIFFERENTIAL/PLATELET - Abnormal; Notable for the following components:      Result Value   HCT 38.3 (*)    Platelets 111 (*)    All other components within normal limits  COMPREHENSIVE METABOLIC PANEL WITH GFR - Abnormal; Notable for the following components:   Creatinine, Ser 1.61 (*)    GFR, Estimated 48 (*)    All other components within normal limits  URINALYSIS, W/ REFLEX TO CULTURE (INFECTION SUSPECTED) - Abnormal; Notable for the following components:   Color, Urine YELLOW (*)    APPearance HAZY (*)    Leukocytes,Ua MODERATE (*)    Bacteria, UA RARE (*)    All other components within normal limits  URINE CULTURE  LIPASE, BLOOD  CBG MONITORING, ED  CBG MONITORING, ED     EKG EKG independently reviewed and interpreted by myself demonstrates:  EKG demonstrates sinus rhythm at a rate of 84,  PR 153, QRS 95, QTc 465, no acute ST changes  RADIOLOGY Imaging independently reviewed and interpreted by myself demonstrates:  CT head without acute bleed CT abdomen pelvis demonstrates large infrarenal aneurysm.  Radiology measured this as 9 x 6.6 cm.  Notes additional aneurysm of left common iliac at 2.7 cm as well as liver lesion of unclear etiology   Formal Radiology Read:  CT Head Wo Contrast Result Date: 09/04/2023 CLINICAL DATA:  Mental status change, unknown cause. EXAM: CT HEAD WITHOUT CONTRAST TECHNIQUE: Contiguous axial images were obtained from the base of the skull through the vertex without intravenous contrast. RADIATION DOSE REDUCTION: This exam was performed according to the departmental dose-optimization program which includes automated exposure control, adjustment of the mA and/or kV according to patient size and/or use of iterative reconstruction technique. COMPARISON:  CT scan head from 04/29/2022. FINDINGS: Brain: No evidence of  acute infarction, hemorrhage, hydrocephalus, extra-axial collection or mass lesion/mass effect. Ventricles are normal. Cerebral volume is age appropriate. Vascular: No hyperdense vessel or unexpected calcification. Skull: Normal. Negative for fracture or focal lesion. Sinuses/Orbits: No acute finding. Other: Visualized mastoid air cells are unremarkable. No mastoid effusion. IMPRESSION: *No acute intracranial abnormality. Electronically Signed   By: Ree Molt M.D.   On: 09/04/2023 10:45   CT ABDOMEN PELVIS W CONTRAST Result Date: 09/04/2023 CLINICAL DATA:  Abdominal pain, acute, nonlocalized.  Weakness. EXAM: CT ABDOMEN AND PELVIS WITH CONTRAST TECHNIQUE: Multidetector CT imaging of the abdomen and pelvis was performed using the standard protocol following bolus administration of intravenous contrast. RADIATION DOSE REDUCTION: This exam was performed according to the departmental dose-optimization program which includes automated exposure control, adjustment of the mA and/or kV according to patient size and/or use of iterative reconstruction technique. CONTRAST:  80mL OMNIPAQUE  IOHEXOL  300 MG/ML  SOLN COMPARISON:  CT scan abdomen and pelvis from 02/23/2014. FINDINGS: Lower chest: There are patchy atelectatic changes in the visualized lung bases. No overt consolidation. No pleural effusion. The heart is normal in size. No pericardial effusion. Hepatobiliary: The liver is normal in size. Non-cirrhotic configuration. There is an ill-defined approximately 8 x 11 mm hypoattenuating lesion in the right hepatic dome, segment 8, which is incompletely characterized on the current exam. The lesion is likely new since the prior study from 2015. If prior imaging is available, comparison can be made. Otherwise, consider evaluation with ultrasound versus multiphasic contrast-enhanced MRI or CT scan abdomen as per hepatic mass protocol. No intrahepatic or extrahepatic bile duct dilation. Small volume dependent sub 5 mm  calcified gallstones noted without imaging signs of acute cholecystitis. Normal gallbladder wall thickness. No pericholecystic inflammatory changes. Pancreas: Unremarkable. No pancreatic ductal dilatation or surrounding inflammatory changes. Spleen: Within normal limits. No focal lesion. Adrenals/Urinary Tract: Adrenal glands are unremarkable. No suspicious renal mass. There are at least 3 nonobstructing calculi in the left kidney with largest in the lower pole measuring 6 x 11 mm. There are at least 3, 1-2 mm nonobstructing calculi in the right kidney. No obstructive uropathy on either side. Unremarkable urinary bladder. Stomach/Bowel: No disproportionate dilation of the small or large bowel loops. No evidence of abnormal bowel wall thickening or inflammatory changes. The appendix is unremarkable. Vascular/Lymphatic: No ascites or pneumoperitoneum. No abdominal or pelvic lymphadenopathy, by size criteria. There is fusiform aneurysmal dilation of infrarenal aorta measuring up to 9 cm in craniocaudal length. The maximum diameter of aneurysmal sac is up to 6.6 cm. There is large intraluminal thrombus/hematoma. No periaortic fat stranding. 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 There is also moderate intraluminal thrombus/hematoma. There are mild peripheral atherosclerotic vascular calcifications of the aorta and its major branches. Reproductive: Normal size prostate. Symmetric seminal vesicles. Other: There is a tiny fat containing umbilical hernia. The soft tissues and abdominal wall are otherwise unremarkable. Musculoskeletal: No suspicious osseous lesions. There are mild - moderate multilevel degenerative changes in the visualized spine. 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 IMPRESSION: 1. There is fusiform aneurysmal dilation of infrarenal aorta measuring up to 9 cm in craniocaudal length and 6.6 cm in diameter. 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 Recommend referral to a vascular specialist. 2. There is an ill-defined approximately 8 x 11 mm hypoattenuating lesion in the right hepatic dome, segment 8, which is incompletely characterized on the current exam. If prior imaging is available, comparison can be made. Otherwise, consider evaluation with ultrasound versus multiphasic contrast-enhanced MRI or CT scan abdomen as per hepatic mass protocol. 3. Multiple other nonacute observations, as described above. Aortic aneurysm NOS (ICD10-I71.9). Aortic Atherosclerosis (ICD10-I70.0). Electronically Signed   By: Ree Molt M.D.   On: 09/04/2023 10:42    PROCEDURES:  Critical Care performed: No  Procedures   MEDICATIONS ORDERED IN ED: Medications  ondansetron  (ZOFRAN ) injection 4 mg (4 mg Intravenous Given 09/04/23 0931)  sodium chloride  0.9 % bolus 1,000 mL (0 mLs Intravenous Stopped 09/04/23 1222)  iohexol  (OMNIPAQUE ) 300 MG/ML solution 100 mL (80 mLs Intravenous Contrast Given 09/04/23 0957)  cefTRIAXone  (ROCEPHIN ) 1 g in sodium chloride  0.9 % 100 mL IVPB (0 g Intravenous Stopped 09/04/23 1103)     IMPRESSION / MDM / ASSESSMENT AND PLAN / ED COURSE  I reviewed the triage vital signs and the nursing notes.  Differential diagnosis includes, but is not limited to, weakness due to hypoglycemia in the setting of poor p.o. intake, acute intra-abdominal process, consideration for acute intracranial process though reassuring neurologic exam, UTI, anemia, electrolyte abnormality  Patient's presentation is most consistent with acute presentation with potential threat to life or bodily function.  63 year old male presenting with weakness and mild hypoglycemia.  BGL improved to the 80s here.  Will obtain labs, CT head and abdomen pelvis to further evaluate.  Ordered for Zofran , IV fluids.  With improved glucose, do think it is reasonable to hold off on additional  dextrose  currently.  Clinical Course as of 09/04/23 1313  Wed Sep 04, 2023  1154 Case reviewed with APP Delores with vascular surgery.  She will review the case with Dr. Marea and provide additional recommendations.  Will keep patient n.p.o. for now for possible operative intervention. [NR]  1207 Received update from APP [facility].  She reviewed the case with Dr. Marea and further reviewed the imaging.  She does recommend admission to medicine service with cardiology consult for medical clearance with anticipated operative intervention tomorrow.  Okay to eat today. [NR]  1250 Received update from [facility] pace with vascular surgery team who evaluated patient at bedside.  He will contact the patient's legal guardian to see if they can arrange consent for intervention, tomorrow at the earliest.  Will reach out to hospitalist team to discuss admission. [NR]  1311 Case reviewed with Dr. Laurita.  He will evaluate for anticipated admission. [NR]    Clinical Course User Index [NR] Levander Slate, MD     FINAL CLINICAL IMPRESSION(S) / ED DIAGNOSES   Final  diagnoses:  Infrarenal abdominal aortic aneurysm (AAA) without rupture (HCC)  Acute cystitis without hematuria     Rx / DC Orders   ED Discharge Orders     None        Note:  This document was prepared using Dragon voice recognition software and may include unintentional dictation errors.   Levander Slate, MD 09/04/23 1314

## 2023-09-04 NOTE — Consult Note (Signed)
 Hospital Consult    Reason for Consult:  AAA Requesting Physician:  Dr Nilsa Cross MD MRN #:  [id number]  History of Present Illness: This is a 63 y.o. male who presents to [facility] emergency department from [facility] care center for evaluation of weakness.  Patient reports weeks of not being able to eat and feeling nauseated.  He denies any vomiting.  Denies any back pain.  Patient's initial BGL was 60 with EMS and was given oral glucose.  Patient does not appear to be hypoglycemic at this time.  Patient underwent CT of the abdomen pelvis as part of his workup.  He was noted to have a 6.6 cm abdominal aortic aneurysm.  Vascular surgery was consulted to evaluate.  Past Medical History:  Diagnosis Date   CAD (coronary artery disease)    a. 10/2013 Lateral STEMI/PCI: LM nl, LAD min irregs, D1 occluded sub branch, LCX min irregs mid, OM large/patent, RCA 149m (2.75x28 Promus DES).   Cholelithiasis    CKD (chronic kidney disease)    Essential hypertension    Hyperlipidemia    MI (myocardial infarction) (HCC)    Nephrolithiasis    Peripheral vascular disease (HCC)    a. 06/2007: s/p L AKA @ [facility]   Schizophrenia (HCC)    Type 2 diabetes mellitus (HCC)     Past Surgical History:  Procedure Laterality Date   Above-the-knee amputation Left    COLONOSCOPY WITH PROPOFOL  N/A 12/09/2018   fields: nonthrombosed hemorrhoids, decreased anal sphincter tone. poor prep, colonoscopy aborted   FLEXIBLE SIGMOIDOSCOPY  11/17/2019   Procedure: FLEXIBLE SIGMOIDOSCOPY;  Surgeon: Cindie Carlin POUR, DO;  Location: [facility];  Service: Endoscopy;;   LEFT HEART CATHETERIZATION WITH CORONARY ANGIOGRAM N/A 11/20/2013   Procedure: LEFT HEART CATHETERIZATION WITH CORONARY ANGIOGRAM;  Surgeon: Candyce GORMAN Reek, MD;  Location: [facility] CATH LAB;  Service: Cardiovascular;  Laterality: N/A;    Allergies  Allergen Reactions   Penicillins Other (See Comments)    DIZZINESS     Prior to Admission medications   Medication  Sig Start Date End Date Taking? Authorizing Provider  acetaminophen  (TYLENOL ) 500 MG tablet Take 500 mg by mouth every 4 (four) hours as needed for moderate pain.    [provider]  atorvastatin  (LIPITOR ) 80 MG tablet Take 1 tablet (80 mg total) by mouth daily. 11/06/22 02/04/23  Parthenia Olivia HERO, PA-C  atropine 1 % ophthalmic solution Place 1-2 drops under the tongue every 4 (four) hours as needed (secretions).    [provider]  cefUROXime  (CEFTIN ) 250 MG tablet Take 1 tablet (250 mg total) by mouth 2 (two) times daily with a meal. 04/21/23   Margrette, Myah A, PA-C  Cholecalciferol (VITAMIN D ) 50 MCG (2000 UT) CAPS Take 2,000 Units by mouth daily at 12 noon.    [provider]  cloZAPine  (CLOZARIL ) 100 MG tablet 100 mg in AM and 500 mg at night Patient taking differently: Take 100-500 mg by mouth See admin instructions. Take 100 mg by mouth in morning and 500 mg at night 03/26/19   Vincente Murrain, MD  cyanocobalamin  1000 MCG tablet Take 1 tablet (1,000 mcg total) by mouth daily. 04/26/22   Von Bellis, MD  docusate sodium  (COLACE) 100 MG capsule Take 100 mg by mouth 2 (two) times daily.    [provider]  EASYMAX TEST test strip 1 each by Other route as needed. 07/06/20   [provider]  feeding supplement (ENSURE ENLIVE / ENSURE PLUS) LIQD Take 237 mLs  by mouth 2 (two) times daily with a meal. Patient taking differently: Take 237 mLs by mouth 2 (two) times daily as needed. 05/03/22   Lue Elsie BROCKS, MD  gabapentin  (NEURONTIN ) 100 MG capsule Take 100 mg by mouth 3 (three) times daily. 10/22/22   [provider]  Insulin  Degludec (TRESIBA) 100 UNIT/ML SOLN Inject 33 Units into the skin at bedtime.    [provider]  ketoconazole (NIZORAL) 2 % shampoo Apply 1 application topically 2 (two) times a week.    [provider]  latanoprost  (XALATAN ) 0.005 % ophthalmic solution Place 1 drop into both eyes at bedtime.    [provider]  LINZESS  145 MCG CAPS capsule TAKE 1 CAPSULE BY MOUTH ONCE DAILY BEFORE BREAKFAST. 08/10/22   Rourk, Lamar HERO, MD  metoprolol  tartrate (LOPRESSOR ) 25 MG tablet Take 1 tablet (25 mg total) by mouth 2 (two) times daily. 11/06/22 02/04/23  Parthenia Olivia HERO, PA-C  Multiple Vitamin (MULTIVITAMIN WITH MINERALS) TABS tablet Take 1 tablet by mouth daily. 05/04/22   Lue Elsie BROCKS, MD  nitroGLYCERIN  (NITROSTAT ) 0.4 MG SL tablet Place 0.4 mg under the tongue every 5 (five) minutes x 3 doses as needed (if no relief after 3rd dose, procee to ED or call 911). 06/28/22   [provider]  NOVOLOG  100 UNIT/ML injection Inject 2 Units into the skin 3 (three) times daily as needed for high blood sugar (before meals). 10/15/22   [provider]  omeprazole (PRILOSEC) 40 MG capsule Take 40 mg by mouth 2 (two) times daily.     [provider]  polyethylene glycol (MIRALAX  / GLYCOLAX ) 17 g packet Take 17 g by mouth daily as needed for moderate constipation. 04/21/23   Margrette, Myah A, PA-C  tamsulosin  (FLOMAX ) 0.4 MG CAPS capsule Take 0.4 mg by mouth daily.    [provider]  thiamine  (VITAMIN B-1) 100 MG tablet Take 1 tablet (100 mg total) by mouth daily. 05/04/22   Lue Elsie BROCKS, MD  topiramate  (TOPAMAX ) 25 MG tablet Take 1 tablet (25 mg total) by mouth 2 (two) times daily. 12/26/22 12/21/23  Camara, Amadou, MD  traZODone  (DESYREL ) 50 MG tablet Take 50 mg by mouth at bedtime.    [provider]    Social History   Socioeconomic History   Marital status: Single    Spouse name: Not on file   Number of children: Not on file   Years of education: Not on file   Highest education level: Not on file  Occupational History   Not on file  Tobacco Use   Smoking status: Every Day    Current packs/day: 1.00    Average packs/day: 1 pack/day for 20.0 years (20.0 ttl pk-yrs)    Types: Cigars, Cigarettes   Smokeless tobacco: Never   Tobacco comments:    1-2  cig daily  Vaping Use   Vaping status: Never Used  Substance and Sexual Activity   Alcohol  use: No    Alcohol /week: 0.0 standard drinks of alcohol    Drug use: No   Sexual activity: Never  Other Topics Concern   Not on file  Social History Narrative   Single, no children      Currently living in a group home      Outpatient psychiatrist month Dr. Susann    Social Drivers of Health   Financial Resource Strain: Not on file  Food Insecurity: Not on file  Transportation Needs: Not on file  Physical Activity: Not on  file  Stress: Not on file  Social Connections: Not on file  Intimate Partner Violence: Not on file     Family History  Problem Relation Age of Onset   Other Other        no premature CAD.   Seizures Neg Hx     ROS: Otherwise negative unless mentioned in HPI  Physical Examination  Vitals:   09/04/23 1130 09/04/23 1230  BP: (!) 129/92 (!) 135/91  Pulse: 79 79  Resp: (!) 24 (!) 0  Temp:    SpO2: 100% 100%   Body mass index is 21.03 kg/m.  General:  WDWN in NAD Gait: Not observed HENT: WNL, normocephalic Pulmonary: normal non-labored breathing, without Rales, rhonchi,  wheezing Cardiac: regular, without  Murmurs, rubs or gallops; without carotid bruits Abdomen: Positive bowel sounds throughout, soft, positive tenderness with palpation in the center of his abdomen./ND, no masses Skin: without rashes Vascular Exam/Pulses: Palpable pulses throughout with warm extremities. Extremities: without ischemic changes, without Gangrene , without cellulitis; without open wounds;  Musculoskeletal: no muscle wasting or atrophy  Neurologic: A&O X 3;  No focal weakness or paresthesias are detected; speech is fluent/normal Psychiatric:  The pt has Abnormal- schizophrenic affect.  Patient is unable to complete thoughts in complete sentences.  Poor historian.  Asking for family relatives that he has been seen for 10 years or more. Lymph:  Unremarkable  CBC     Component Value Date/Time   WBC 6.9 09/04/2023 0916   RBC 4.31 09/04/2023 0916   HGB 13.0 09/04/2023 0916   HGB 16.8 07/08/2013 1428   HCT 38.3 (L) 09/04/2023 0916   HCT 49.7 07/08/2013 1428   PLT 111 (L) 09/04/2023 0916   PLT 175 07/08/2013 1428   MCV 88.9 09/04/2023 0916   MCV 90 07/08/2013 1428   MCH 30.2 09/04/2023 0916   MCHC 33.9 09/04/2023 0916   RDW 14.7 09/04/2023 0916   RDW 16.2 (H) 07/08/2013 1428   LYMPHSABS 1.7 09/04/2023 0916   LYMPHSABS 2.5 07/08/2013 1428   MONOABS 0.5 09/04/2023 0916   MONOABS 0.8 07/08/2013 1428   EOSABS 0.0 09/04/2023 0916   EOSABS 0.0 07/08/2013 1428   BASOSABS 0.0 09/04/2023 0916   BASOSABS 0.1 07/08/2013 1428    BMET    Component Value Date/Time   NA 142 09/04/2023 0916   NA 142 12/26/2022 1032   NA 139 07/08/2013 1428   K 3.8 09/04/2023 0916   K 3.9 07/08/2013 1428   CL 110 09/04/2023 0916   CL 108 (H) 07/08/2013 1428   CO2 23 09/04/2023 0916   CO2 24 07/08/2013 1428   GLUCOSE 82 09/04/2023 0916   GLUCOSE 63 (L) 07/08/2013 1428   BUN 17 09/04/2023 0916   BUN 17 12/26/2022 1032   BUN 19 (H) 07/08/2013 1428   CREATININE 1.61 (H) 09/04/2023 0916   CREATININE 1.46 (H) 07/08/2013 1428   CALCIUM  9.1 09/04/2023 0916   CALCIUM  9.2 07/08/2013 1428   GFRNONAA 48 (L) 09/04/2023 0916   GFRNONAA 54 (L) 07/08/2013 1428   GFRAA >60 11/17/2019 1137   GFRAA >60 07/08/2013 1428    COAGS: Lab Results  Component Value Date   INR 1.3 (H) 04/25/2022     Non-Invasive Vascular Imaging:   EXAM:09/04/2023 CT ABDOMEN AND PELVIS WITH CONTRAST   TECHNIQUE: Multidetector CT imaging of the abdomen and pelvis was performed using the standard protocol following bolus administration of intravenous contrast.   RADIATION DOSE REDUCTION: This exam was performed according to the departmental  dose-optimization program which includes automated exposure control, adjustment of the mA and/or kV according to patient size and/or use of iterative  reconstruction technique.   CONTRAST:  80mL OMNIPAQUE  IOHEXOL  300 MG/ML  SOLN   COMPARISON:  CT scan abdomen and pelvis from 02/23/2014.   FINDINGS: Lower chest: There are patchy atelectatic changes in the visualized lung bases. No overt consolidation. No pleural effusion. The heart is normal in size. No pericardial effusion.   Hepatobiliary: The liver is normal in size. Non-cirrhotic configuration. There is an ill-defined approximately 8 x 11 mm hypoattenuating lesion in the right hepatic dome, segment 8, which is incompletely characterized on the current exam. The lesion is likely new since the prior study from 2015. If prior imaging is available, comparison can be made. Otherwise, consider evaluation with ultrasound versus multiphasic contrast-enhanced MRI or CT scan abdomen as per hepatic mass protocol. No intrahepatic or extrahepatic bile duct dilation. Small volume dependent sub 5 mm calcified gallstones noted without imaging signs of acute cholecystitis. Normal gallbladder wall thickness. No pericholecystic inflammatory changes.   Pancreas: Unremarkable. No pancreatic ductal dilatation or surrounding inflammatory changes.   Spleen: Within normal limits. No focal lesion.   Adrenals/Urinary Tract: Adrenal glands are unremarkable. No suspicious renal mass. There are at least 3 nonobstructing calculi in the left kidney with largest in the lower pole measuring 6 x 11 mm. There are at least 3, 1-2 mm nonobstructing calculi in the right kidney. No obstructive uropathy on either side. Unremarkable urinary bladder.   Stomach/Bowel: No disproportionate dilation of the small or large bowel loops. No evidence of abnormal bowel wall thickening or inflammatory changes. The appendix is unremarkable.   Vascular/Lymphatic: No ascites or pneumoperitoneum. No abdominal or pelvic lymphadenopathy, by size criteria. There is fusiform aneurysmal dilation of infrarenal aorta measuring up to  9 cm in craniocaudal length. The maximum diameter of aneurysmal sac is up to 6.6 cm. There is large intraluminal thrombus/hematoma. No periaortic fat stranding.   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 There is also moderate intraluminal thrombus/hematoma. There are mild peripheral atherosclerotic vascular calcifications of the aorta and its major branches.   Reproductive: Normal size prostate. Symmetric seminal vesicles.   Other: There is a tiny fat containing umbilical hernia. The soft tissues and abdominal wall are otherwise unremarkable.   Musculoskeletal: No suspicious osseous lesions. There are mild - moderate multilevel degenerative changes in the visualized spine. 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   IMPRESSION: 1. There is fusiform aneurysmal dilation of infrarenal aorta measuring up to 9 cm in craniocaudal length and 6.6 cm in diameter. 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 Recommend referral to a vascular specialist. 2. There is an ill-defined approximately 8 x 11 mm hypoattenuating lesion in the right hepatic dome, segment 8, which is incompletely characterized on the current exam. If prior imaging is available, comparison can be made. Otherwise, consider evaluation with ultrasound versus multiphasic contrast-enhanced MRI or CT scan abdomen as per hepatic mass protocol. 3. Multiple other nonacute observations, as described above.   Aortic aneurysm NOS (ICD10-I71.9).   Aortic Atherosclerosis (ICD10-I70.0).  Statin:  Yes.   Beta Blocker:  Yes.   Aspirin :  No. ACEI:  No. ARB:  No. CCB use:  No Other antiplatelets/anticoagulants:  No.    ASSESSMENT/PLAN: This is a 63 y.o. male who presents to [facility] emergency  department with generalized weakness.  EMS checked patient's blood sugar which was 60 or below he was  given an amp of D50 for hypoglycemia.  According to nursing staff patient is much more alert and oriented after receiving some sugar.  He was also complaining of abdominal pain and underwent a CT of his abdomen and pelvis.  This revealed a fusiform aneurysmal dilation of the infrarenal aorta measuring up to 9 cm in craniocaudal length and 6.6 cm in diameter.  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  The abdominal aortic aneurysm measuring 6.6 in diameter I believe is correlated to his chronic abdominal pain which the patient describes he has now had for the last 3 weeks.  At this measurement patient is at moderate to high risk for aneurysm rupture.  Immediate repair of this abdominal aortic aneurysm is needed.  According to the patient's chart he appears to be a ward of the state and lives in [facility] care center.  It appears she is under the care of Lawanda Ray.  I reached out to both the facility and her private phone number which was given to you by the facility to speak with her concerning the patient's need for care of his abdominal aortic aneurysm.  Unfortunately I was only able to leave a message with Ms. [name].  I believe it would be in the patient's best interest that we go ahead and emergently without consent from the caretaker if we are unable to reach her within the next 24 hours.  I did speak with the patient who to the best of his knowledge I believe understands he has a critical situation that needs to be repaired and verbally consents with me to proceed as soon as possible.  Therefore vascular surgery plans on taking the patient to the vascular lab tomorrow on 09/05/2023 for an endovascular abdominal aortic aneurysm repair.  I did discuss in detail with the patient the procedure, benefits, risks and complications.  He verbalizes his understanding to the best of his ability.  I answered all his questions today.   Patient will be made n.p.o. for the procedure tomorrow.   -I discussed the case in detail with Dr. Selinda Gu MD and he understands that this case may need to be declared an emergent case as the aneurysm he has could be fatal.  He agrees with the plan.   Gwendlyn JONELLE Shank [facility] 09/04/2023 12:45 PM

## 2023-09-04 NOTE — ED Notes (Signed)
 Patient provided diet soda as requested. Sec notified to order box meals as the stock is empty. Patient updated on food.

## 2023-09-04 NOTE — ED Notes (Signed)
 Patient up in bed feeding self dinner tray which was set up by this nurse.

## 2023-09-04 NOTE — ED Notes (Signed)
 CCMD notified of monitoring order

## 2023-09-05 ENCOUNTER — Encounter
Admission: EM | Disposition: A | Discharge status: Home / Self Care | Payer: Self-pay | Source: Skilled Nursing Facility | Attending: Internal Medicine

## 2023-09-05 ENCOUNTER — Inpatient Hospital Stay: Admitting: Certified Registered Nurse Anesthetist

## 2023-09-05 DIAGNOSIS — R1084 Generalized abdominal pain: Secondary | ICD-10-CM | POA: Diagnosis not present

## 2023-09-05 DIAGNOSIS — I7143 Infrarenal abdominal aortic aneurysm, without rupture: Principal | ICD-10-CM

## 2023-09-05 DIAGNOSIS — I714 Abdominal aortic aneurysm, without rupture, unspecified: Secondary | ICD-10-CM

## 2023-09-05 DIAGNOSIS — T82330A Leakage of aortic (bifurcation) graft (replacement), initial encounter: Secondary | ICD-10-CM

## 2023-09-05 DIAGNOSIS — I723 Aneurysm of iliac artery: Secondary | ICD-10-CM

## 2023-09-05 DIAGNOSIS — I70202 Unspecified atherosclerosis of native arteries of extremities, left leg: Secondary | ICD-10-CM

## 2023-09-05 HISTORY — PX: ENDOVASCULAR REPAIR/STENT GRAFT: CATH118280

## 2023-09-05 LAB — CBC
HCT: 33.9 % — ABNORMAL LOW (ref 39.0–52.0)
Hemoglobin: 11.2 g/dL — ABNORMAL LOW (ref 13.0–17.0)
MCH: 29 pg (ref 26.0–34.0)
MCHC: 33 g/dL (ref 30.0–36.0)
MCV: 87.8 fL (ref 80.0–100.0)
Platelets: 108 K/uL — ABNORMAL LOW (ref 150–400)
RBC: 3.86 MIL/uL — ABNORMAL LOW (ref 4.22–5.81)
RDW: 14.7 % (ref 11.5–15.5)
WBC: 6.2 K/uL (ref 4.0–10.5)
nRBC: 0 % (ref 0.0–0.2)

## 2023-09-05 LAB — BASIC METABOLIC PANEL WITH GFR
Anion gap: 9 (ref 5–15)
BUN: 15 mg/dL (ref 8–23)
CO2: 22 mmol/L (ref 22–32)
Calcium: 8.9 mg/dL (ref 8.9–10.3)
Chloride: 110 mmol/L (ref 98–111)
Creatinine, Ser: 1.27 mg/dL — ABNORMAL HIGH (ref 0.61–1.24)
GFR, Estimated: >60 mL/min (ref >60)
Glucose, Bld: 101 mg/dL — ABNORMAL HIGH (ref 70–99)
Potassium: 4 mmol/L (ref 3.5–5.1)
Sodium: 141 mmol/L (ref 135–145)

## 2023-09-05 LAB — SURGICAL PCR SCREEN
MRSA, PCR: NEGATIVE
Staphylococcus aureus: POSITIVE — AB

## 2023-09-05 LAB — HEMOGLOBIN A1C
Hgb A1c MFr Bld: 5.8 % — ABNORMAL HIGH (ref 4.8–5.6)
Mean Plasma Glucose: 120 mg/dL

## 2023-09-05 LAB — GLUCOSE, CAPILLARY
Glucose-Capillary: 113 mg/dL — ABNORMAL HIGH (ref 70–99)
Glucose-Capillary: 96 mg/dL (ref 70–99)
Glucose-Capillary: 107 mg/dL — ABNORMAL HIGH (ref 70–99)
Glucose-Capillary: 117 mg/dL — ABNORMAL HIGH (ref 70–99)
Glucose-Capillary: 128 mg/dL — ABNORMAL HIGH (ref 70–99)
Glucose-Capillary: 120 mg/dL — ABNORMAL HIGH (ref 70–99)
Glucose-Capillary: 254 mg/dL — ABNORMAL HIGH (ref 70–99)

## 2023-09-05 LAB — HIV ANTIBODY (ROUTINE TESTING W REFLEX): HIV Screen 4th Generation wRfx: NONREACTIVE

## 2023-09-05 SURGERY — AORTIC INTERVENTION
Anesthesia: Moderate Sedation

## 2023-09-05 SURGERY — ENDOVASCULAR STENT GRAFT (AAA)
Anesthesia: General

## 2023-09-05 MED ORDER — VASOPRESSIN 20 UNIT/ML IV SOLN
INTRAVENOUS | Status: AC
  Filled 2023-09-05: qty 1

## 2023-09-05 MED ORDER — VASOPRESSIN 20 UNIT/ML IV SOLN
INTRAVENOUS | Status: DC | PRN
Start: 2023-09-05 — End: 2023-09-05
  Administered 2023-09-05: 2 [IU] via INTRAVENOUS

## 2023-09-05 MED ORDER — ONDANSETRON HCL 4 MG/2ML IJ SOLN
INTRAMUSCULAR | Status: DC | PRN
  Administered 2023-09-05: 4 mg via INTRAVENOUS

## 2023-09-05 MED ORDER — ROCURONIUM BROMIDE 100 MG/10ML IV SOLN
INTRAVENOUS | Status: DC | PRN
  Administered 2023-09-05: 50 mg via INTRAVENOUS
  Administered 2023-09-05: 10 mg via INTRAVENOUS

## 2023-09-05 MED ORDER — HEPARIN SODIUM (PORCINE) 1000 UNIT/ML IJ SOLN
INTRAMUSCULAR | Status: AC
  Filled 2023-09-05: qty 10

## 2023-09-05 MED ORDER — IODIXANOL 320 MG/ML IV SOLN
INTRAVENOUS | Status: DC | PRN
  Administered 2023-09-05: 65 mL

## 2023-09-05 MED ORDER — EPHEDRINE 5 MG/ML INJ
INTRAVENOUS | Status: AC
  Filled 2023-09-05: qty 5

## 2023-09-05 MED ORDER — CHLORHEXIDINE GLUCONATE CLOTH 2 % EX PADS
6.0000 | MEDICATED_PAD | Freq: Every day | CUTANEOUS | Status: DC
  Administered 2023-09-05 (×2): 6 via TOPICAL

## 2023-09-05 MED ORDER — HEPARIN (PORCINE) IN NACL 2000-0.9 UNIT/L-% IV SOLN
INTRAVENOUS | Status: DC | PRN
  Administered 2023-09-05: 1000 mL

## 2023-09-05 MED ORDER — CEFAZOLIN SODIUM-DEXTROSE 1-4 GM/50ML-% IV SOLN
INTRAVENOUS | Status: DC | PRN
  Administered 2023-09-05: 2 g via INTRAVENOUS

## 2023-09-05 MED ORDER — ONDANSETRON HCL 4 MG/2ML IJ SOLN
INTRAMUSCULAR | Status: AC
  Filled 2023-09-05: qty 2

## 2023-09-05 MED ORDER — HEPARIN SODIUM (PORCINE) 1000 UNIT/ML IJ SOLN
INTRAMUSCULAR | Status: DC | PRN
Start: 2023-09-05 — End: 2023-09-05
  Administered 2023-09-05: 6000 [IU] via INTRAVENOUS

## 2023-09-05 MED ORDER — PROPOFOL 10 MG/ML IV BOLUS
INTRAVENOUS | Status: AC
Start: 2023-09-05 — End: 2023-09-05
  Filled 2023-09-05: qty 20

## 2023-09-05 MED ORDER — EPHEDRINE SULFATE-NACL 50-0.9 MG/10ML-% IV SOSY
PREFILLED_SYRINGE | INTRAVENOUS | Status: DC | PRN
  Administered 2023-09-05: 10 mg via INTRAVENOUS
  Administered 2023-09-05: 15 mg via INTRAVENOUS

## 2023-09-05 MED ORDER — ASPIRIN 81 MG PO TBEC
81.0000 mg | DELAYED_RELEASE_TABLET | Freq: Every day | ORAL | Status: DC
  Administered 2023-09-05 – 2023-09-06 (×2): 81 mg via ORAL
  Filled 2023-09-05 (×2): qty 1

## 2023-09-05 MED ORDER — OXYCODONE HCL 5 MG PO TABS: 5.0000 mg | ORAL_TABLET | Freq: Once | ORAL | Status: DC | PRN

## 2023-09-05 MED ORDER — PHENYLEPHRINE 80 MCG/ML (10ML) SYRINGE FOR IV PUSH (FOR BLOOD PRESSURE SUPPORT)
PREFILLED_SYRINGE | INTRAVENOUS | Status: DC | PRN
  Administered 2023-09-05 (×2): 160 ug via INTRAVENOUS

## 2023-09-05 MED ORDER — CLOPIDOGREL BISULFATE 75 MG PO TABS
75.0000 mg | ORAL_TABLET | Freq: Every day | ORAL | Status: DC
  Administered 2023-09-05 – 2023-09-06 (×2): 75 mg via ORAL
  Filled 2023-09-05 (×3): qty 1

## 2023-09-05 MED ORDER — HEPARIN (PORCINE) IN NACL 1000-0.9 UT/500ML-% IV SOLN
INTRAVENOUS | Status: DC | PRN
  Administered 2023-09-05: 1000 mL

## 2023-09-05 MED ORDER — PROPOFOL 10 MG/ML IV BOLUS
INTRAVENOUS | Status: DC | PRN
  Administered 2023-09-05: 120 mg via INTRAVENOUS

## 2023-09-05 MED ORDER — FENTANYL CITRATE (PF) 100 MCG/2ML IJ SOLN
INTRAMUSCULAR | Status: DC | PRN
  Administered 2023-09-05 (×2): 50 ug via INTRAVENOUS

## 2023-09-05 MED ORDER — FENTANYL CITRATE (PF) 100 MCG/2ML IJ SOLN: 25.0000 ug | INTRAMUSCULAR | Status: DC | PRN

## 2023-09-05 MED ORDER — ROCURONIUM BROMIDE 10 MG/ML (PF) SYRINGE
PREFILLED_SYRINGE | INTRAVENOUS | Status: AC
  Filled 2023-09-05: qty 10

## 2023-09-05 MED ORDER — DEXAMETHASONE SODIUM PHOSPHATE 10 MG/ML IJ SOLN
INTRAMUSCULAR | Status: AC
  Filled 2023-09-05: qty 1

## 2023-09-05 MED ORDER — PHENYLEPHRINE HCL-NACL 20-0.9 MG/250ML-% IV SOLN
INTRAVENOUS | Status: AC
  Filled 2023-09-05: qty 250

## 2023-09-05 MED ORDER — SODIUM CHLORIDE 0.9 % IV SOLN: INTRAVENOUS | Status: DC

## 2023-09-05 MED ORDER — DEXAMETHASONE SODIUM PHOSPHATE 10 MG/ML IJ SOLN
INTRAMUSCULAR | Status: DC | PRN
  Administered 2023-09-05: 5 mg via INTRAVENOUS

## 2023-09-05 MED ORDER — FENTANYL CITRATE (PF) 100 MCG/2ML IJ SOLN
INTRAMUSCULAR | Status: AC
  Filled 2023-09-05: qty 2

## 2023-09-05 MED ORDER — LACTATED RINGERS IV SOLN: INTRAVENOUS | Status: DC | PRN

## 2023-09-05 MED ORDER — LIDOCAINE HCL (CARDIAC) PF 100 MG/5ML IV SOSY
PREFILLED_SYRINGE | INTRAVENOUS | Status: DC | PRN
  Administered 2023-09-05: 60 mg via INTRAVENOUS

## 2023-09-05 MED ORDER — OXYCODONE HCL 5 MG/5ML PO SOLN: 5.0000 mg | Freq: Once | ORAL | Status: DC | PRN

## 2023-09-05 MED ORDER — PHENYLEPHRINE 80 MCG/ML (10ML) SYRINGE FOR IV PUSH (FOR BLOOD PRESSURE SUPPORT)
PREFILLED_SYRINGE | INTRAVENOUS | Status: AC
Start: 2023-09-05 — End: 2023-09-05
  Filled 2023-09-05: qty 10

## 2023-09-05 MED ORDER — SUGAMMADEX SODIUM 200 MG/2ML IV SOLN
INTRAVENOUS | Status: DC | PRN
  Administered 2023-09-05: 200 mg via INTRAVENOUS

## 2023-09-05 MED ORDER — PHENYLEPHRINE 80 MCG/ML (10ML) SYRINGE FOR IV PUSH (FOR BLOOD PRESSURE SUPPORT)
PREFILLED_SYRINGE | INTRAVENOUS | Status: AC
  Filled 2023-09-05: qty 10

## 2023-09-05 MED ORDER — POLYVINYL ALCOHOL 1.4 % OP SOLN
1.0000 [drp] | OPHTHALMIC | Status: DC | PRN
  Filled 2023-09-05: qty 15

## 2023-09-05 MED ORDER — LIDOCAINE HCL (PF) 2 % IJ SOLN
INTRAMUSCULAR | Status: AC
Start: 2023-09-05 — End: 2023-09-05
  Filled 2023-09-05: qty 5

## 2023-09-05 MED ORDER — CEFAZOLIN SODIUM-DEXTROSE 2-4 GM/100ML-% IV SOLN
INTRAVENOUS | Status: AC
  Filled 2023-09-05: qty 100

## 2023-09-05 SURGICAL SUPPLY — 33 items
BALLOON DORADO 10X80X80 (BALLOONS) IMPLANT
CATH ACCU-VU SIZ PIG 5F 70CM (CATHETERS) IMPLANT
CATH BALLN CODA 9X100X32 (BALLOONS) IMPLANT
CATH BEACON 5.038 65CM KMP-01 (CATHETERS) IMPLANT
CATH MICROCATH PRGRT 2.8F 110 (CATHETERS) IMPLANT
CATH TEMPO 5F RIM 65CM (CATHETERS) IMPLANT
CLOSURE PERCLOSE PROSTYLE (VASCULAR PRODUCTS) IMPLANT
COIL 400 COMPLEX SOFT 8X35CM (Vascular Products) IMPLANT
COIL 400 COMPLEX SOFT 8X60CM (Vascular Products) IMPLANT
COVER PROBE ULTRASOUND 5X96 (MISCELLANEOUS) IMPLANT
DEVICE OCCLUSION POD5 (Vascular Products) IMPLANT
DEVICE PRESTO INFLATION (MISCELLANEOUS) IMPLANT
DEVICE SAFEGUARD 24CM (GAUZE/BANDAGES/DRESSINGS) IMPLANT
DEVICE TORQUE (MISCELLANEOUS) IMPLANT
EXCLUDER TNK 28.5X14.5X12 16F (Endovascular Graft) IMPLANT
EXTENDER ENDOPROSTHESIS 10X7 (Endovascular Graft) IMPLANT
GLIDEWIRE STIFF .35X180X3 HYDR (WIRE) IMPLANT
HANDLE DETACHMENT COIL (MISCELLANEOUS) IMPLANT
LEG CONTRALATERAL 16X14.5X10 (Vascular Products) IMPLANT
LEG CONTRALATERAL 16X18X9.5 (Endovascular Graft) IMPLANT
PACK ANGIOGRAPHY (CUSTOM PROCEDURE TRAY) ×1 IMPLANT
PACK BASIN MAJOR ARMC (MISCELLANEOUS) IMPLANT
SHEATH BRITE TIP 6FRX11 (SHEATH) IMPLANT
SHEATH BRITE TIP 8FRX11 (SHEATH) IMPLANT
SHEATH DRYSEAL FLEX 12FR 33CM (SHEATH) IMPLANT
SHEATH DRYSEAL FLEX 18FR 33CM (SHEATH) IMPLANT
SPONGE XRAY 4X4 16PLY STRL (MISCELLANEOUS) IMPLANT
SUTURE MNCRL 4-0 27XMF (SUTURE) IMPLANT
SYR MEDRAD MARK 7 150ML (SYRINGE) IMPLANT
TRAY FOLEY SLVR 16FR LF STAT (SET/KITS/TRAYS/PACK) IMPLANT
TUBING CONTRAST HIGH PRESS 72 (TUBING) IMPLANT
WIRE AMPLATZ SSTIFF .035X260CM (WIRE) IMPLANT
WIRE J 3MM .035X145CM (WIRE) IMPLANT

## 2023-09-05 NOTE — Anesthesia Procedure Notes (Signed)
 Procedure Name: Intubation Date/Time: 09/05/2023 1:22 PM  Performed by: Niki Manus SAUNDERS, CRNAPre-anesthesia Checklist: Patient identified, Patient being monitored, Timeout performed, Emergency Drugs available and Suction available Patient Re-evaluated:Patient Re-evaluated prior to induction Oxygen Delivery Method: Circle system utilized Preoxygenation: Pre-oxygenation with 100% oxygen Induction Type: IV induction Ventilation: Mask ventilation without difficulty Laryngoscope Size: McGrath and 4 Grade View: Grade I Tube type: Oral Tube size: 7.0 mm Number of attempts: 1 Airway Equipment and Method: Stylet Placement Confirmation: ETT inserted through vocal cords under direct vision, positive ETCO2 and breath sounds checked- equal and bilateral Secured at: 21 cm Tube secured with: Tape Dental Injury: Teeth and Oropharynx as per pre-operative assessment

## 2023-09-05 NOTE — Interval H&P Note (Signed)
 History and Physical Interval Note:  09/05/2023 12:03 PM  Oscar Elliott  has presented today for surgery, with the diagnosis of symptomatic AAA.  The various methods of treatment have been discussed with the patient and family. After consideration of risks, benefits and other options for treatment, the patient has consented to  Procedure(s) with comments: ENDOVASCULAR REPAIR/STENT GRAFT (N/A) - with coil embolization of left internal iliac artery as a surgical intervention.  The patient's history has been reviewed, patient examined, no change in status, stable for surgery.  I have reviewed the patient's chart and labs.  Questions were answered to the patient's satisfaction.     Bellagrace Sylvan

## 2023-09-05 NOTE — H&P (View-Only) (Signed)
 Progress Note    09/05/2023 9:48 AM * Day of Surgery *  Subjective:  Oscar Elliott is a 63 y.o. male who presents to [facility] emergency department from [facility] care center for evaluation of weakness.  Patient reports weeks of not being able to eat and feeling nauseated.  He denies any vomiting.  Denies any back pain.  Patient's initial BGL was 60 with EMS and was given oral glucose.  Patient does not appear to be hypoglycemic at this time.  Patient underwent CT of the abdomen pelvis as part of his workup.  He was noted to have a 6.6 cm abdominal aortic aneurysm.   Patient is resting, morning.  Denies any abdominal pain.  No complaints overnight.  Vitals all remained stable.  Patient was informed we will proceed with procedure today.   Vitals:   09/05/23 0754 09/05/23 0833  BP: (!) 138/95 (!) 138/95  Pulse: 88   Resp: 16   Temp: 97.9 F (36.6 C)   SpO2: 100%    Physical Exam: Cardiac:  RRR, normal S1 and S2.  No rubs clicks gallops or murmurs noted. Lungs: Normal labored breathing, clear throughout on auscultation.  No rales rhonchi or wheezing. Incisions: None Extremities: Palpable pulses throughout all extremities.  All extremities are warm to touch. Abdomen: Positive bowel sounds throughout, soft, generalized tenderness throughout, nondistended. Neurologic: Alert and oriented x 3, answers questions and follows commands.  CBC    Component Value Date/Time   WBC 6.2 09/05/2023 0435   RBC 3.86 (L) 09/05/2023 0435   HGB 11.2 (L) 09/05/2023 0435   HGB 16.8 07/08/2013 1428   HCT 33.9 (L) 09/05/2023 0435   HCT 49.7 07/08/2013 1428   PLT 108 (L) 09/05/2023 0435   PLT 175 07/08/2013 1428   MCV 87.8 09/05/2023 0435   MCV 90 07/08/2013 1428   MCH 29.0 09/05/2023 0435   MCHC 33.0 09/05/2023 0435   RDW 14.7 09/05/2023 0435   RDW 16.2 (H) 07/08/2013 1428   LYMPHSABS 1.7 09/04/2023 0916   LYMPHSABS 2.5 07/08/2013 1428   MONOABS 0.5 09/04/2023 0916   MONOABS 0.8 07/08/2013 1428    EOSABS 0.0 09/04/2023 0916   EOSABS 0.0 07/08/2013 1428   BASOSABS 0.0 09/04/2023 0916   BASOSABS 0.1 07/08/2013 1428    BMET    Component Value Date/Time   NA 141 09/05/2023 0435   NA 142 12/26/2022 1032   NA 139 07/08/2013 1428   K 4.0 09/05/2023 0435   K 3.9 07/08/2013 1428   CL 110 09/05/2023 0435   CL 108 (H) 07/08/2013 1428   CO2 22 09/05/2023 0435   CO2 24 07/08/2013 1428   GLUCOSE 101 (H) 09/05/2023 0435   GLUCOSE 63 (L) 07/08/2013 1428   BUN 15 09/05/2023 0435   BUN 17 12/26/2022 1032   BUN 19 (H) 07/08/2013 1428   CREATININE 1.27 (H) 09/05/2023 0435   CREATININE 1.46 (H) 07/08/2013 1428   CALCIUM  8.9 09/05/2023 0435   CALCIUM  9.2 07/08/2013 1428   GFRNONAA >60 09/05/2023 0435   GFRNONAA 54 (L) 07/08/2013 1428   GFRAA >60 11/17/2019 1137   GFRAA >60 07/08/2013 1428    INR    Component Value Date/Time   INR 1.3 (H) 04/25/2022 1620     Intake/Output Summary (Last 24 hours) at 09/05/2023 0948 Last data filed at 09/05/2023 0849 Gross per 24 hour  Intake 290 ml  Output 2275 ml  Net -1985 ml     Assessment/Plan:  63 y.o. male who presents to  [facility] emergency department from [facility] care center for evaluation of generalized weakness.  Patient underwent CT of the abdomen pelvis and was found to have a 6.6 cm abdominal aortic aneurysm repair.* Day of Surgery *   There is some confusion about getting consent for this patient.  He lives in [facility] care center and listed in the computer is the order of this center this [name].  She is listed as his legal guardian but after speaking with her this morning that is not the case.  He lives in a facility she owns but the patient makes all his own decisions regarding his health care.  He is considered competent.  He was the one who made decision to come to the emergency room due to his weakness yesterday.  Therefore as his legal guardian he states he makes all his own medical decisions.  He also does not have any  family to note.  Vascular surgery plans on taking the patient to the vascular lab today on 09/05/2023 for endovascular abdominal aortic aneurysm repair.  I discussed in detail with the patient the procedure, benefits, risk, and complications.  Patient verbalizes understanding and wishes to proceed.  Patient has been n.p.o. since midnight last night for the procedure today.  Patient currently is not on any anticoagulation or aspirin .  I discussed the case in detail with Dr. Selinda Gu MD and he agrees with the plan.   Gwendlyn JONELLE Shank [facility] 09/05/2023 9:48 AM

## 2023-09-05 NOTE — Anesthesia Preprocedure Evaluation (Signed)
 Anesthesia Evaluation  Patient identified by MRN, date of birth, ID band Patient confused    Reviewed: H&P , NPO status , Patient's Chart, lab work & pertinent test results  Airway Mallampati: III  TM Distance: >3 FB Neck ROM: full    Dental  (+) Chipped   Pulmonary neg pulmonary ROS, Current Smoker and Patient abstained from smoking.   Pulmonary exam normal        Cardiovascular hypertension, + CAD, + Past MI and + Peripheral Vascular Disease  Normal cardiovascular exam     Neuro/Psych  PSYCHIATRIC DISORDERS    Schizophrenia  negative neurological ROS     GI/Hepatic negative GI ROS, Neg liver ROS,,,  Endo/Other  negative endocrine ROSdiabetes    Renal/GU Renal disease     Musculoskeletal   Abdominal   Peds  Hematology  (+) Blood dyscrasia, anemia   Anesthesia Other Findings Patient has a PMH of schizophrenia and is A&O to self. Patient knows he is at the hospital but doesn't know which one and doesn't know why. Complaining about a headache and blurry vision but states that those are not new and he has glasses for his vision problems. Patient is unable to consent for himself due to his mental status and has no family to contact for consent. Per the surgeon, will declare the case emergent and proceed with the case based on necessity.   Past Medical History: No date: CAD (coronary artery disease)     Comment:  a. 10/2013 Lateral STEMI/PCI: LM nl, LAD min irregs, D1               occluded sub branch, LCX min irregs mid, OM large/patent,              RCA 113m (2.75x28 Promus DES). No date: Cholelithiasis No date: CKD (chronic kidney disease) No date: Essential hypertension No date: Hyperlipidemia No date: MI (myocardial infarction) (HCC) No date: Nephrolithiasis No date: Peripheral vascular disease (HCC)     Comment:  a. 06/2007: s/p L AKA @ [facility] No date: Schizophrenia (HCC) No date: Type 2 diabetes mellitus  (HCC)  Past Surgical History: No date: Above-the-knee amputation; Left 12/09/2018: COLONOSCOPY WITH PROPOFOL ; N/A     Comment:  fields: nonthrombosed hemorrhoids, decreased anal               sphincter tone. poor prep, colonoscopy aborted 11/17/2019: FLEXIBLE SIGMOIDOSCOPY     Comment:  Procedure: FLEXIBLE SIGMOIDOSCOPY;  Surgeon: Cindie Carlin POUR, DO;  Location: [facility];  Service:               Endoscopy;; 11/20/2013: LEFT HEART CATHETERIZATION WITH CORONARY ANGIOGRAM; N/A     Comment:  Procedure: LEFT HEART CATHETERIZATION WITH CORONARY               ANGIOGRAM;  Surgeon: Candyce GORMAN Reek, MD;  Location:               [facility] CATH LAB;  Service: Cardiovascular;  Laterality: N/A;  BMI    Body Mass Index: 16.97 kg/m      Reproductive/Obstetrics negative OB ROS                              Anesthesia Physical Anesthesia Plan  ASA: 3  Anesthesia Plan: General ETT   Post-op Pain Management:    Induction: Intravenous  PONV Risk  Score and Plan: 2 and Dexamethasone  and Ondansetron   Airway Management Planned: Oral ETT  Additional Equipment:   Intra-op Plan:   Post-operative Plan: Extubation in OR  Informed Consent: I have reviewed the patients History and Physical, chart, labs and discussed the procedure including the risks, benefits and alternatives for the proposed anesthesia with the patient or authorized representative who has indicated his/her understanding and acceptance.     Dental Advisory Given  Plan Discussed with: Anesthesiologist, CRNA and Surgeon  Anesthesia Plan Comments: (Patient unable to consent for risks of anesthesia. Patient has no family on file to contact. Will proceed based on surgeon declaring case an emergent based on necessity.  )        Anesthesia Quick Evaluation

## 2023-09-05 NOTE — Progress Notes (Signed)
 Patient continuously complaining of foley catheter and wanting it removed. Per Dr. Marea okay to remove foley catheter with verbal order to re-insert if patient does not void in 6 hours. Catheter removed at 1800.

## 2023-09-05 NOTE — Op Note (Signed)
 OPERATIVE NOTE   PROCEDURE: US  guidance for vascular access, bilateral femoral arteries Catheter placement into aorta from bilateral femoral approaches Catheter placement to left internal iliac artery from left femoral approach Coil embolization of the left hypogastric artery without 3 Ruby coils Placement of a 28 mm proximal conformable C3 Gore Excluder Endoprosthesis main body right with a 14 mm diameter by 10 cm length left iliac contralateral limb Placement of the right iliac extender with an 18 mm diameter by 10 cm length limb Placement of a left iliac extender into the left external iliac artery due to 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 male who presents with severe abdominal pain with no other obvious source with a greater than 6.5 cm abdominal aortic aneurysm.  This is considered a symptomatic aneurysm. The anatomy was suitable for endovascular repair.  Risks and benefits of repair in an endovascular fashion were discussed and informed consent was obtained.   DESCRIPTION: After obtaining full informed written consent, the patient was brought back to the operating room and placed supine upon the operating table.  The patient received IV antibiotics prior to induction.  After obtaining adequate anesthesia, the patient was prepped and draped in the standard fashion for endovascular AAA repair.  We then began by gaining access to both femoral arteries with US  guidance.  The femoral arteries were found to be patent and accessed without difficulty with a needle under ultrasound guidance  without difficulty on each side and permanent images were recorded.  We then placed 2 proglide devices on each side in a pre-close fashion and placed 8 French sheaths. The patient was then given 6000 units of intravenous heparin .  I began by performing a retrograde angiogram through the left femoral sheath with an LAO projection to splay open the origin of the internal iliac artery.  It had some stenosis proximally with poststenotic dilatation and the termination of the left common iliac artery aneurysm was close enough to this internal iliac artery that it would require coil embolization of the left hypogastric artery to avoid endoleak.  I then cannulated the left hypogastric artery with a rim catheter and then advanced a Progreat microcatheter down with selective imaging of the left internal iliac artery was performed.  I then began coil embolization at the location of the primary branches of the left internal iliac artery.  We began with a pod 30 coil followed by 8 mm diameter by 35 cm length soft and an 8 mm diameter by 60 cm length soft.  This came back to the proximal left internal iliac artery with excellent packing of the coils in the left internal iliac artery with successful embolization.  The Progreat microcatheter and rim catheter were then removed.   The Pigtail catheter was placed into the aorta from the left side. Using this image, we selected a 28 mm diameter conformable Gore C3 excluder Main body device.  Over a stiff wire, an 16 French sheath was placed up the right side. The main body was then placed through the 16 French sheath. A magnified image at the renal arteries was performed.  The main body was then deployed just below the lowest renal artery which was the left renal artery. The Kumpe catheter was used to cannulate the contralateral gate without difficulty and successful cannulation was confirmed by twirling the pigtail catheter in the main body. We then placed a stiff wire and a  retrograde arteriogram was performed through the left femoral sheath. We upsized to the 12 [place] sheath on the left side for the contralateral limb and a 14 mm diameter by 10 cm length left iliac limb was selected and deployed.  Due to the aneurysmal degeneration down to the left internal iliac artery, an additional iliac limb was required to get down into the left external iliac artery.  A 10 mm diameter by 7 cm length left iliac extender was taken down about 3 to 4 cm into the left external iliac artery.  The main body deployment was then completed. Based off the angiographic findings, extension limbs were necessary.  An 18 mm diameter by 10 cm length right iliac limb was taken down to just above the right hypogastric artery. All junction points and seals zones were treated with the compliant balloon and a 10 mm diameter high-pressure angioplasty balloon was used in the left common iliac artery for occlusive disease with good result. The pigtail catheter was then replaced and a completion angiogram was performed.  A small type II endoleak was detected on completion angiography.  No type I or III endoleak's were identified.  The renal arteries were found to be widely patent.  The right hypogastric artery was widely patent.  The left hypogastric artery had been successfully coil embolized and then excluded with a stent graft. At this point we elected to terminate the procedure. We secured the pro glide devices for hemostasis on the femoral arteries. The skin incision was closed with a 4-0 Monocryl. Dermabond and pressure dressing were placed. The patient was taken to the recovery room in stable condition having tolerated the procedure well.  COMPLICATIONS: none  CONDITION: stable  Selinda Gu  09/05/2023, 2:34 PM   This note was created with Dragon Medical transcription system. Any errors in dictation are purely unintentional.

## 2023-09-05 NOTE — Progress Notes (Signed)
 PROGRESS NOTE    Oscar Elliott  FMW:[id number] DOB: 05/16/1960 DOA: 09/04/2023 PCP: Gwenith Shuck, NP    Brief Narrative:    63 y.o. male with medical history significant of schizophrenia, HTN, HLD, IDDM, PVD status post left AKA, CKD stage IIIa, sent from his facility for evaluation of worsening of abdominal pain.   Patient reported that he has been having worsening of a chronic abdominal pain this morning.  For the last 3+ months patient has had occasional global abdominal pain, sharp-like worsening with eating, denied any nauseous vomiting or diarrhea.  He usually takes pain medication and the pain subside on its own.  This morning, he started to have severe abdominal pain, sharp like, located above umbilical area.  Denied any nausea vomiting diarrhea no fever or chills.  The facility checked patient glucose which was low and EMS was called.   Assessment & Plan:   Principal Problem:   AAA (abdominal aortic aneurysm) (HCC) Active Problems:   Abdominal pain   Acute on chronic abdominal pain Large infrarenal aorta aneurysm 6.6 cm diameter.  Symptomatic.  Concern for impending rupture Emergent authorization obtained Questionable medical decision-making capability however group home director was contacted and stated clearly the patient does make his own medical decisions Plan: OR today for endovascular repair/stent with coil embolization of left internal iliac artery Strict blood pressure control Hold VTE prophylaxis   Hypoglycemia IDDM Secondary to poor intake from ongoing abdominal pain.   Plan: Hold off Lantus and standing dose of Humalog SSI for now   Incidental finding of liver mass - Normal LFTs - As per recommendation from radiology, RUQ ultrasound ordered - Outpatient follow-up for MRI   HTN Continue current regimen   CKD stage III Euvolemic.  Kidney function at baseline.  Continue to monitor.   History of schizophrenia - Mentation at baseline - Continue  Abilify , clozapine  and Cymbalta  and Neurontin      DVT prophylaxis: On hold for OR Code Status: Full Family Communication: None Disposition Plan: Status is: Inpatient Remains inpatient appropriate because: AAA requiring surgical repair   Level of care: Telemetry Medical  Consultants:  Vascular surgery  Procedures:  Endovascular repair AAA  Antimicrobials: None   Subjective: Seen and examined.  History limited by baseline level of mentation.  Reports hunger  Objective: Vitals:   09/05/23 0754 09/05/23 0833 09/05/23 1207 09/05/23 1233  BP: (!) 138/95 (!) 138/95 (!) 132/99 (!) 127/93  Pulse: 88  87 82  Resp: 16  18 (!) 22  Temp: 97.9 F (36.6 C)  98.3 F (36.8 C) 98.1 F (36.7 C)  TempSrc:   Oral Temporal  SpO2: 100%  100% 99%  Weight:      Height:        Intake/Output Summary (Last 24 hours) at 09/05/2023 1258 Last data filed at 09/05/2023 0849 Gross per 24 hour  Intake 240 ml  Output 2275 ml  Net -2035 ml   Filed Weights   09/04/23 0913 09/04/23 1459  Weight: 53.8 kg 46.3 kg    Examination:  General exam: NAD Respiratory system: Clear.  No work of breathing.  Room air Cardiovascular system: S1-S2, RRR, no murmurs, no pedal edema Gastrointestinal system: Soft, nondistended, tender to palpation mid epigastrium Central nervous system: A.  Unable to assess orientation.  No focal deficits Extremities: Status post left AKA Skin: No rashes, lesions or ulcers Psychiatry: Judgement and insight appear impaired. Mood & affect flattened.     Data Reviewed: I have personally reviewed following labs and  imaging studies  CBC: Recent Labs  Lab 09/04/23 0916 09/05/23 0435  WBC 6.9 6.2  NEUTROABS 4.6  --   HGB 13.0 11.2*  HCT 38.3* 33.9*  MCV 88.9 87.8  PLT 111* 108*   Basic Metabolic Panel: Recent Labs  Lab 09/04/23 0916 09/05/23 0435  NA 142 141  K 3.8 4.0  CL 110 110  CO2 23 22  GLUCOSE 82 101*  BUN 17 15  CREATININE 1.61* 1.27*  CALCIUM  9.1  8.9   GFR: Estimated Creatinine Clearance: 39 mL/min (A) (by C-G formula based on SCr of 1.27 mg/dL (H)). Liver Function Tests: Recent Labs  Lab 09/04/23 0916  AST 27  ALT 20  ALKPHOS 78  BILITOT 0.9  PROT 6.7  ALBUMIN 3.7   Recent Labs  Lab 09/04/23 0916  LIPASE 25   No results for input(s): AMMONIA in the last 168 hours. Coagulation Profile: No results for input(s): INR, PROTIME in the last 168 hours. Cardiac Enzymes: No results for input(s): CKTOTAL, CKMB, CKMBINDEX, TROPONINI in the last 168 hours. BNP (last 3 results) No results for input(s): PROBNP in the last 8760 hours. HbA1C: Recent Labs    09/04/23 0916  HGBA1C 5.8*   CBG: Recent Labs  Lab 09/04/23 1658 09/04/23 2003 09/05/23 0754 09/05/23 1122 09/05/23 1205  GLUCAP 120* 159* 113* 96 107*   Lipid Profile: No results for input(s): CHOL, HDL, LDLCALC, TRIG, CHOLHDL, LDLDIRECT in the last 72 hours. Thyroid Function Tests: No results for input(s): TSH, T4TOTAL, FREET4, T3FREE, THYROIDAB in the last 72 hours. Anemia Panel: No results for input(s): VITAMINB12, FOLATE, FERRITIN, TIBC, IRON, RETICCTPCT in the last 72 hours. Sepsis Labs: No results for input(s): PROCALCITON, LATICACIDVEN in the last 168 hours.  Recent Results (from the past 240 hours)  Urine Culture     Status: Abnormal (Preliminary result)   Collection Time: 09/04/23  9:20 AM   Specimen: Urine, Random  Result Value Ref Range Status   Specimen Description   Final    URINE, RANDOM Performed at [facility], [address] [place], [place] [address]    Special Requests   Final    NONE Reflexed from [id number] Performed at [facility], [address] [place], [place] [address]    Culture (A)  Final    >=100,000 COLONIES/mL CITROBACTER KOSERI SUSCEPTIBILITIES TO FOLLOW Performed at [facility], [address], [place], [place] [address]    Report Status  PENDING  Incomplete  Surgical PCR screen     Status: Abnormal   Collection Time: 09/04/23 11:57 PM   Specimen: Nasal Mucosa; Nasal Swab  Result Value Ref Range Status   MRSA, PCR NEGATIVE NEGATIVE Final   Staphylococcus aureus POSITIVE (A) NEGATIVE Final    Comment: (NOTE) The Xpert SA Assay (FDA approved for NASAL specimens in patients 68 years of age and older), is one component of a comprehensive surveillance program. It is not intended to diagnose infection nor to guide or monitor treatment. Performed at [facility], [address] [place], [place] [address]          Radiology Studies: US  Abdomen Limited RUQ (LIVER/GB) Result Date: 09/04/2023 CLINICAL DATA:  Indeterminate liver lesion on recent CT EXAM: ULTRASOUND ABDOMEN LIMITED RIGHT UPPER QUADRANT COMPARISON:  09/04/2023 FINDINGS: Gallbladder: Multiple shadowing gallstones are identified measuring up to 7 mm. No gallbladder wall thickening or pericholecystic fluid. Negative sonographic Murphy sign. Common bile duct: Diameter: 3 mm Liver: There is an 8 x 8 x 11  mm hypoechoic lesion within the anterior right lobe liver, demonstrating mild posterior acoustic enhancement, most consistent with a small cyst. This corresponds to the abnormality on CT. The remainder of the liver is unremarkable. No biliary duct dilation. Portal vein is patent on color Doppler imaging with normal direction of blood flow towards the liver. Other: None. IMPRESSION: 1. Probable 11 mm right lobe liver cyst, corresponding to the CT abnormality. Given the appearance and characteristics, this is likely benign and no specific imaging follow-up is recommended. 2. Cholelithiasis without evidence of cholecystitis. Electronically Signed   By: Ozell Daring M.D.   On: 09/04/2023 15:33   CT Head Wo Contrast Result Date: 09/04/2023 CLINICAL DATA:  Mental status change, unknown cause. EXAM: CT HEAD WITHOUT CONTRAST TECHNIQUE: Contiguous axial images were obtained  from the base of the skull through the vertex without intravenous contrast. RADIATION DOSE REDUCTION: This exam was performed according to the departmental dose-optimization program which includes automated exposure control, adjustment of the mA and/or kV according to patient size and/or use of iterative reconstruction technique. COMPARISON:  CT scan head from 04/29/2022. FINDINGS: Brain: No evidence of acute infarction, hemorrhage, hydrocephalus, extra-axial collection or mass lesion/mass effect. Ventricles are normal. Cerebral volume is age appropriate. Vascular: No hyperdense vessel or unexpected calcification. Skull: Normal. Negative for fracture or focal lesion. Sinuses/Orbits: No acute finding. Other: Visualized mastoid air cells are unremarkable. No mastoid effusion. IMPRESSION: *No acute intracranial abnormality. Electronically Signed   By: Ree Molt M.D.   On: 09/04/2023 10:45   CT ABDOMEN PELVIS W CONTRAST Result Date: 09/04/2023 CLINICAL DATA:  Abdominal pain, acute, nonlocalized.  Weakness. EXAM: CT ABDOMEN AND PELVIS WITH CONTRAST TECHNIQUE: Multidetector CT imaging of the abdomen and pelvis was performed using the standard protocol following bolus administration of intravenous contrast. RADIATION DOSE REDUCTION: This exam was performed according to the departmental dose-optimization program which includes automated exposure control, adjustment of the mA and/or kV according to patient size and/or use of iterative reconstruction technique. CONTRAST:  80mL OMNIPAQUE  IOHEXOL  300 MG/ML  SOLN COMPARISON:  CT scan abdomen and pelvis from 02/23/2014. FINDINGS: Lower chest: There are patchy atelectatic changes in the visualized lung bases. No overt consolidation. No pleural effusion. The heart is normal in size. No pericardial effusion. Hepatobiliary: The liver is normal in size. Non-cirrhotic configuration. There is an ill-defined approximately 8 x 11 mm hypoattenuating lesion in the right hepatic dome,  segment 8, which is incompletely characterized on the current exam. The lesion is likely new since the prior study from 2015. If prior imaging is available, comparison can be made. Otherwise, consider evaluation with ultrasound versus multiphasic contrast-enhanced MRI or CT scan abdomen as per hepatic mass protocol. No intrahepatic or extrahepatic bile duct dilation. Small volume dependent sub 5 mm calcified gallstones noted without imaging signs of acute cholecystitis. Normal gallbladder wall thickness. No pericholecystic inflammatory changes. Pancreas: Unremarkable. No pancreatic ductal dilatation or surrounding inflammatory changes. Spleen: Within normal limits. No focal lesion. Adrenals/Urinary Tract: Adrenal glands are unremarkable. No suspicious renal mass. There are at least 3 nonobstructing calculi in the left kidney with largest in the lower pole measuring 6 x 11 mm. There are at least 3, 1-2 mm nonobstructing calculi in the right kidney. No obstructive uropathy on either side. Unremarkable urinary bladder. Stomach/Bowel: No disproportionate dilation of the small or large bowel loops. No evidence of abnormal bowel wall thickening or inflammatory changes. The appendix is unremarkable. Vascular/Lymphatic: No ascites or pneumoperitoneum. No abdominal or pelvic lymphadenopathy, by size criteria.  There is fusiform aneurysmal dilation of infrarenal aorta measuring up to 9 cm in craniocaudal length. The maximum diameter of aneurysmal sac is up to 6.6 cm. There is large intraluminal thrombus/hematoma. No periaortic fat stranding. 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 There is also moderate intraluminal thrombus/hematoma. There are mild peripheral atherosclerotic vascular calcifications of the aorta and its major branches. Reproductive: Normal size prostate. Symmetric seminal vesicles. Other: There is a tiny fat containing umbilical hernia. The  soft tissues and abdominal wall are otherwise unremarkable. Musculoskeletal: No suspicious osseous lesions. There are mild - moderate multilevel degenerative changes in the visualized spine. 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 IMPRESSION: 1. There is fusiform aneurysmal dilation of infrarenal aorta measuring up to 9 cm in craniocaudal length and 6.6 cm in diameter. 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 Recommend referral to a vascular specialist. 2. There is an ill-defined approximately 8 x 11 mm hypoattenuating lesion in the right hepatic dome, segment 8, which is incompletely characterized on the current exam. If prior imaging is available, comparison can be made. Otherwise, consider evaluation with ultrasound versus multiphasic contrast-enhanced MRI or CT scan abdomen as per hepatic mass protocol. 3. Multiple other nonacute observations, as described above. Aortic aneurysm NOS (ICD10-I71.9). Aortic Atherosclerosis (ICD10-I70.0). Electronically Signed   By: Ree Molt M.D.   On: 09/04/2023 10:42        Scheduled Meds:  [MAR Hold] atorvastatin   80 mg Oral Daily   [MAR Hold] cloZAPine   100 mg Oral Daily   [MAR Hold] cloZAPine   500 mg Oral QHS   [MAR Hold] docusate sodium   100 mg Oral BID   [MAR Hold] feeding supplement  237 mL Oral BID WC   [MAR Hold] gabapentin   100 mg Oral TID   [MAR Hold] insulin  aspart  0-9 Units Subcutaneous TID WC   [MAR Hold] latanoprost   1 drop Both Eyes QHS   [MAR Hold] linaclotide   145 mcg Oral QAC breakfast   [MAR Hold] metoprolol  tartrate  25 mg Oral BID   [MAR Hold] mupirocin  ointment  1 Application Nasal BID   [MAR Hold] pantoprazole   40 mg Oral Daily   [MAR Hold] tamsulosin   0.4 mg Oral Daily   [MAR Hold] topiramate   25 mg Oral BID   [MAR Hold] traZODone   50 mg Oral QHS   Continuous Infusions:  sodium chloride  75 mL/hr at 09/05/23 0849     LOS: 1 day      Calvin KATHEE Robson, MD [facility]   If 7PM-7AM, please contact night-coverage  09/05/2023, 12:58 PM

## 2023-09-05 NOTE — Transfer of Care (Signed)
 Immediate Anesthesia Transfer of Care Note  Patient: Oscar Elliott  Procedure(s) Performed: ENDOVASCULAR REPAIR/STENT GRAFT  Patient Location: PACU  Anesthesia Type:General  Level of Consciousness: drowsy  Airway & Oxygen Therapy: Patient Spontanous Breathing and Patient connected to face mask oxygen  Post-op Assessment: Report given to RN and Post -op Vital signs reviewed and stable  Post vital signs: Reviewed and stable  Last Vitals:  Vitals Value Taken Time  BP 150/93 09/05/23 14:43  Temp    Pulse 94 09/05/23 14:45  Resp 26 09/05/23 14:45  SpO2 100 % 09/05/23 14:45  Vitals shown include unfiled device data.  Last Pain:  Vitals:   09/05/23 1233  TempSrc: Temporal  PainSc: 0-No pain         Complications: There were no known notable events for this encounter.

## 2023-09-05 NOTE — Anesthesia Postprocedure Evaluation (Signed)
 Anesthesia Post Note  Patient: Oscar Elliott  Procedure(s) Performed: ENDOVASCULAR REPAIR/STENT GRAFT  Patient location during evaluation: PACU Anesthesia Type: General Level of consciousness: awake and alert Pain management: pain level controlled Vital Signs Assessment: post-procedure vital signs reviewed and stable Respiratory status: spontaneous breathing, nonlabored ventilation, respiratory function stable and patient connected to nasal cannula oxygen Cardiovascular status: blood pressure returned to baseline and stable Postop Assessment: no apparent nausea or vomiting Anesthetic complications: no   There were no known notable events for this encounter.   Last Vitals:  Vitals:   09/05/23 1450 09/05/23 1500  BP: 129/81 124/80  Pulse: 85 79  Resp: 15 19  Temp:    SpO2: 100% 100%    Last Pain:  Vitals:   09/05/23 1500  TempSrc:   PainSc: Asleep                 Debby Mines

## 2023-09-05 NOTE — Progress Notes (Signed)
 Progress Note    09/05/2023 9:48 AM * Day of Surgery *  Subjective:  Oscar Elliott is a 63 y.o. male who presents to [facility] emergency department from [facility] care center for evaluation of weakness.  Patient reports weeks of not being able to eat and feeling nauseated.  He denies any vomiting.  Denies any back pain.  Patient's initial BGL was 60 with EMS and was given oral glucose.  Patient does not appear to be hypoglycemic at this time.  Patient underwent CT of the abdomen pelvis as part of his workup.  He was noted to have a 6.6 cm abdominal aortic aneurysm.   Patient is resting, morning.  Denies any abdominal pain.  No complaints overnight.  Vitals all remained stable.  Patient was informed we will proceed with procedure today.   Vitals:   09/05/23 0754 09/05/23 0833  BP: (!) 138/95 (!) 138/95  Pulse: 88   Resp: 16   Temp: 97.9 F (36.6 C)   SpO2: 100%    Physical Exam: Cardiac:  RRR, normal S1 and S2.  No rubs clicks gallops or murmurs noted. Lungs: Normal labored breathing, clear throughout on auscultation.  No rales rhonchi or wheezing. Incisions: None Extremities: Palpable pulses throughout all extremities.  All extremities are warm to touch. Abdomen: Positive bowel sounds throughout, soft, generalized tenderness throughout, nondistended. Neurologic: Alert and oriented x 3, answers questions and follows commands.  CBC    Component Value Date/Time   WBC 6.2 09/05/2023 0435   RBC 3.86 (L) 09/05/2023 0435   HGB 11.2 (L) 09/05/2023 0435   HGB 16.8 07/08/2013 1428   HCT 33.9 (L) 09/05/2023 0435   HCT 49.7 07/08/2013 1428   PLT 108 (L) 09/05/2023 0435   PLT 175 07/08/2013 1428   MCV 87.8 09/05/2023 0435   MCV 90 07/08/2013 1428   MCH 29.0 09/05/2023 0435   MCHC 33.0 09/05/2023 0435   RDW 14.7 09/05/2023 0435   RDW 16.2 (H) 07/08/2013 1428   LYMPHSABS 1.7 09/04/2023 0916   LYMPHSABS 2.5 07/08/2013 1428   MONOABS 0.5 09/04/2023 0916   MONOABS 0.8 07/08/2013 1428    EOSABS 0.0 09/04/2023 0916   EOSABS 0.0 07/08/2013 1428   BASOSABS 0.0 09/04/2023 0916   BASOSABS 0.1 07/08/2013 1428    BMET    Component Value Date/Time   NA 141 09/05/2023 0435   NA 142 12/26/2022 1032   NA 139 07/08/2013 1428   K 4.0 09/05/2023 0435   K 3.9 07/08/2013 1428   CL 110 09/05/2023 0435   CL 108 (H) 07/08/2013 1428   CO2 22 09/05/2023 0435   CO2 24 07/08/2013 1428   GLUCOSE 101 (H) 09/05/2023 0435   GLUCOSE 63 (L) 07/08/2013 1428   BUN 15 09/05/2023 0435   BUN 17 12/26/2022 1032   BUN 19 (H) 07/08/2013 1428   CREATININE 1.27 (H) 09/05/2023 0435   CREATININE 1.46 (H) 07/08/2013 1428   CALCIUM  8.9 09/05/2023 0435   CALCIUM  9.2 07/08/2013 1428   GFRNONAA >60 09/05/2023 0435   GFRNONAA 54 (L) 07/08/2013 1428   GFRAA >60 11/17/2019 1137   GFRAA >60 07/08/2013 1428    INR    Component Value Date/Time   INR 1.3 (H) 04/25/2022 1620     Intake/Output Summary (Last 24 hours) at 09/05/2023 0948 Last data filed at 09/05/2023 0849 Gross per 24 hour  Intake 290 ml  Output 2275 ml  Net -1985 ml     Assessment/Plan:  63 y.o. male who presents to  [facility] emergency department from [facility] care center for evaluation of generalized weakness.  Patient underwent CT of the abdomen pelvis and was found to have a 6.6 cm abdominal aortic aneurysm repair.* Day of Surgery *   There is some confusion about getting consent for this patient.  He lives in [facility] care center and listed in the computer is the order of this center this [name].  She is listed as his legal guardian but after speaking with her this morning that is not the case.  He lives in a facility she owns but the patient makes all his own decisions regarding his health care.  He is considered competent.  He was the one who made decision to come to the emergency room due to his weakness yesterday.  Therefore as his legal guardian he states he makes all his own medical decisions.  He also does not have any  family to note.  Vascular surgery plans on taking the patient to the vascular lab today on 09/05/2023 for endovascular abdominal aortic aneurysm repair.  I discussed in detail with the patient the procedure, benefits, risk, and complications.  Patient verbalizes understanding and wishes to proceed.  Patient has been n.p.o. since midnight last night for the procedure today.  Patient currently is not on any anticoagulation or aspirin .  I discussed the case in detail with Dr. Selinda Gu MD and he agrees with the plan.   Gwendlyn JONELLE Shank [facility] 09/05/2023 9:48 AM

## 2023-09-05 NOTE — Plan of Care (Signed)
  Problem: Coping: Goal: Ability to adjust to condition or change in health will improve Outcome: Progressing   Problem: Metabolic: Goal: Ability to maintain appropriate glucose levels will improve Outcome: Progressing   Problem: Nutritional: Goal: Maintenance of adequate nutrition will improve Outcome: Progressing   Problem: Skin Integrity: Goal: Risk for impaired skin integrity will decrease Outcome: Progressing   Problem: Tissue Perfusion: Goal: Adequacy of tissue perfusion will improve Outcome: Progressing   Problem: Nutrition: Goal: Adequate nutrition will be maintained Outcome: Progressing

## 2023-09-05 NOTE — Plan of Care (Signed)

## 2023-09-05 NOTE — Progress Notes (Signed)
 Patient with questionable decision-making capacity, but has a large symptomatic abdominal aortic aneurysm which is an urgent/emergent problem and is medically necessary to fix due to the likelihood of impending rupture and death.  We can consider this medically necessary and urgent.

## 2023-09-05 NOTE — Progress Notes (Signed)
 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

## 2023-09-06 ENCOUNTER — Encounter: Payer: Self-pay | Admitting: Vascular Surgery

## 2023-09-06 DIAGNOSIS — I7143 Infrarenal abdominal aortic aneurysm, without rupture: Secondary | ICD-10-CM | POA: Diagnosis not present

## 2023-09-06 LAB — GLUCOSE, CAPILLARY
Glucose-Capillary: 135 mg/dL — ABNORMAL HIGH (ref 70–99)
Glucose-Capillary: 189 mg/dL — ABNORMAL HIGH (ref 70–99)

## 2023-09-06 MED ORDER — CLOPIDOGREL BISULFATE 75 MG PO TABS: 75.0000 mg | ORAL_TABLET | Freq: Every day | ORAL | 0 refills | Status: DC

## 2023-09-06 MED ORDER — ASPIRIN 81 MG PO TBEC: 81.0000 mg | DELAYED_RELEASE_TABLET | Freq: Every day | ORAL | 0 refills | Status: AC

## 2023-09-06 MED ORDER — ASPIRIN 81 MG PO TBEC: 81.0000 mg | DELAYED_RELEASE_TABLET | Freq: Every day | ORAL | Status: DC

## 2023-09-06 MED ORDER — ORAL CARE MOUTH RINSE: 15.0000 mL | OROMUCOSAL | Status: DC | PRN

## 2023-09-06 NOTE — Care Management Important Message (Signed)
 Important Message  Patient Details  Name: Oscar Elliott MRN: [id number] Date of Birth: [birth year]   Important Message Given:  Yes - Medicare IM     [name] 09/06/2023, 1:25 PM

## 2023-09-06 NOTE — Plan of Care (Signed)
  Problem: Coping: Goal: Ability to adjust to condition or change in health will improve Outcome: Progressing   Problem: Fluid Volume: Goal: Ability to maintain a balanced intake and output will improve Outcome: Progressing   Problem: Nutritional: Goal: Maintenance of adequate nutrition will improve Outcome: Progressing   Problem: Skin Integrity: Goal: Risk for impaired skin integrity will decrease Outcome: Progressing   Problem: Tissue Perfusion: Goal: Adequacy of tissue perfusion will improve Outcome: Progressing   Problem: Clinical Measurements: Goal: Ability to maintain clinical measurements within normal limits will improve Outcome: Progressing Goal: Will remain free from infection Outcome: Progressing Goal: Diagnostic test results will improve Outcome: Progressing   Problem: Pain Managment: Goal: General experience of comfort will improve and/or be controlled Outcome: Progressing   Problem: Safety: Goal: Ability to remain free from injury will improve Outcome: Progressing

## 2023-09-06 NOTE — Progress Notes (Signed)
 Progress Note    09/06/2023 8:41 AM 1 Day Post-Op  Subjective:  Oscar Elliott is a 63 yo male now POD #1 from:  PROCEDURE: US  guidance for vascular access, bilateral femoral arteries Catheter placement into aorta from bilateral femoral approaches Catheter placement to left internal iliac artery from left femoral approach Coil embolization of the left hypogastric artery without 3 Ruby coils Placement of a 28 mm proximal conformable C3 Gore Excluder Endoprosthesis main body right with a 14 mm diameter by 10 cm length left iliac contralateral limb Placement of the right iliac extender with an 18 mm diameter by 10 cm length limb Placement of a left 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  This is considered a symptomatic aneurysm.   Patient rest comfortably in bed this morning.  Denies any abdominal pain this morning.  He does endorse that bilateral groins are sore as expected.  Vitals are remained stable.  Patient urinating and eating well.  No complications overnight vitals all remained stable.  Vitals:   09/06/23 0630 09/06/23 0700  BP:  124/74  Pulse: 92 87  Resp: 20 17  Temp:    SpO2: 99% 100%   Physical Exam: Cardiac:  RRR, normal S1 and S2.  No rubs clicks gallops murmurs. Lungs: Normal labored breathing, lungs clear on auscultation throughout but diminished in the bases.  No rales rhonchi or wheezing noted Incisions: Bilateral groin puncture sites with dressing clean dry and intact.  No hematoma seroma noted. Extremities: All extremities warm to touch this morning with palpable pulses. Abdomen: Positive bowel sounds throughout, soft, nontender and nondistended. Neurologic: Alert  and oriented x 3, answers questions and follows commands appropriately.  CBC    Component Value Date/Time   WBC 6.2 09/05/2023 0435   RBC 3.86 (L) 09/05/2023 0435   HGB 11.2 (L) 09/05/2023 0435   HGB 16.8 07/08/2013 1428   HCT 33.9 (L) 09/05/2023 0435   HCT 49.7 07/08/2013 1428   PLT 108 (L) 09/05/2023 0435   PLT 175 07/08/2013 1428   MCV 87.8 09/05/2023 0435   MCV 90 07/08/2013 1428   MCH 29.0 09/05/2023 0435   MCHC 33.0 09/05/2023 0435   RDW 14.7 09/05/2023 0435   RDW 16.2 (H) 07/08/2013 1428   LYMPHSABS 1.7 09/04/2023 0916   LYMPHSABS 2.5 07/08/2013 1428   MONOABS 0.5 09/04/2023 0916   MONOABS 0.8 07/08/2013 1428   EOSABS 0.0 09/04/2023 0916   EOSABS 0.0 07/08/2013 1428   BASOSABS 0.0 09/04/2023 0916   BASOSABS 0.1 07/08/2013 1428    BMET    Component Value Date/Time   NA 141 09/05/2023 0435   NA 142 12/26/2022 1032   NA 139 07/08/2013 1428   K 4.0 09/05/2023 0435   K 3.9 07/08/2013 1428   CL 110 09/05/2023 0435   CL 108 (H) 07/08/2013 1428   CO2 22 09/05/2023 0435   CO2 24 07/08/2013 1428   GLUCOSE 101 (H) 09/05/2023 0435   GLUCOSE 63 (L) 07/08/2013 1428   BUN 15 09/05/2023 0435   BUN 17 12/26/2022 1032   BUN 19 (H) 07/08/2013 1428   CREATININE 1.27 (H) 09/05/2023 0435   CREATININE 1.46 (H) 07/08/2013 1428   CALCIUM  8.9 09/05/2023 0435  CALCIUM  9.2 07/08/2013 1428   GFRNONAA >60 09/05/2023 0435   GFRNONAA 54 (L) 07/08/2013 1428   GFRAA >60 11/17/2019 1137   GFRAA >60 07/08/2013 1428    INR    Component Value Date/Time   INR 1.3 (H) 04/25/2022 1620     Intake/Output Summary (Last 24 hours) at 09/06/2023 0841 Last data filed at 09/06/2023 0640 Gross per 24 hour  Intake 1499.08 ml  Output 1895 ml  Net -395.92 ml     Assessment/Plan:  63 y.o. male is s/p See Above 1 Day Post-Op   PLAN Patient does not complain of any abdominal symptoms this morning.  He is urinating and eating well.  Bilateral groins are sore as expected as he is recovering.   No complications postprocedure to note.  Patient's vitals all remained stable.  Okay per vascular surgery for the patient to discharge home today.  Patient has been placed on aspirin  81 mg daily Plavix  75 mg daily and Lipitor  20 mg daily.  I had a conversation with him this morning to reinforce he cannot miss or skip any of these medications as they will affect the outcome of his procedure.  He verbalizes understanding.  He will be discharged back to his care facility which is [facility] care center.  I have spoken to Ms. Oscar Elliott this morning to alert her of the patient's pending discharge.  She is listed as his legal guardian and Oscar Elliott lives in her care center.  She will arrange a ride for him to return to the facility.  Patient will also follow-up with vein and vascular surgery as scheduled in 3 weeks with duplex ultrasounds.   DVT prophylaxis: ASA 81 mg daily and Plavix  75 mg daily.   Gwendlyn JONELLE Shank [facility] 09/06/2023 8:41 AM

## 2023-09-06 NOTE — Progress Notes (Signed)
 Pt discharged to [facility] at this time. Pt taken to [facility] entrance by wheelchair. All discharge education sent with patient.

## 2023-09-06 NOTE — Progress Notes (Signed)
 Patient voided 200 ml clear yellow urine using urinal with RN assistance.

## 2023-09-06 NOTE — Discharge Summary (Addendum)
 Physician Discharge Summary  Oscar Elliott FMW:[id number] DOB: May 21, 1960 DOA: 09/04/2023  PCP: Gwenith Shuck, NP  Admit date: 09/04/2023 Discharge date: 09/06/2023  Admitted From: Home Disposition:  Home (group home)  Recommendations for Outpatient Follow-up:  Follow up with PCP in 1-2 weeks Follow up vascular surgery 3 weeks  Home Health:No  Equipment/Devices:None   Discharge Condition:Stable  CODE STATUS:FULL  Diet recommendation: Reg  Brief/Interim Summary:    63 y.o. male with medical history significant of schizophrenia, HTN, HLD, IDDM, PVD status post left AKA, CKD stage IIIa, sent from his facility for evaluation of worsening of abdominal pain.   Patient reported that he has been having worsening of a chronic abdominal pain this morning.  For the last 3+ months patient has had occasional global abdominal pain, sharp-like worsening with eating, denied any nauseous vomiting or diarrhea.  He usually takes pain medication and the pain subside on its own.  This morning, he started to have severe abdominal pain, sharp like, located above umbilical area.  Denied any nausea vomiting diarrhea no fever or chills.  The facility checked patient glucose which was low and EMS was called.  S/p endovascular repair of AAA.  Stable post procedure, tolerated well.  Appropriate for return to group home.  DAPT ASA and Plavix  prescribed on DC.  Continue remainder of home medications.  Follow up outpatient PCP 1-2 weeks.  Follow up outpatient vascular surgery 3 weeks.   Discharge Diagnoses:  Principal Problem:   AAA (abdominal aortic aneurysm) (HCC) Active Problems:   Abdominal pain  Acute on chronic abdominal pain Large infrarenal aorta aneurysm 6.6 cm diameter.  Symptomatic.  Concern for impending rupture Emergent authorization obtained Questionable medical decision-making capability however group home director was contacted and stated clearly the patient does make his own medical  decisions Plan:  S/p endovascular repair of AAA.  Stable post procedure, tolerated well.  Appropriate for return to group home.  DAPT ASA and Plavix  prescribed on DC.  Continue remainder of home medications.  Follow up outpatient PCP 1-2 weeks.  Follow up outpatient vascular surgery 3 weeks.     Discharge Instructions  Discharge Instructions     Ambulatory Referral for Lung Cancer Scre   Complete by: As directed    Diet - low sodium heart healthy   Complete by: As directed    Increase activity slowly   Complete by: As directed       Allergies as of 09/06/2023       Reactions   Penicillins Other (See Comments)   DIZZINESS         Medication List     STOP taking these medications    cefUROXime  250 MG tablet Commonly known as: CEFTIN    NovoLOG  100 UNIT/ML injection Generic drug: insulin  aspart       TAKE these medications    acetaminophen  500 MG tablet Commonly known as: TYLENOL  Take 500 mg by mouth every 4 (four) hours as needed for moderate pain.   aspirin  EC 81 MG tablet Take 1 tablet (81 mg total) by mouth daily. Swallow whole.   atorvastatin  80 MG tablet Commonly known as: LIPITOR  Take 1 tablet (80 mg total) by mouth daily.   atropine 1 % ophthalmic solution Place 1-2 drops under the tongue every 4 (four) hours as needed (secretions).   busPIRone 10 MG tablet Commonly known as: BUSPAR Take 10 mg by mouth 2 (two) times daily.   clopidogrel  75 MG tablet Commonly known as: PLAVIX  Take 1 tablet (75 mg  total) by mouth daily.   cloZAPine  100 MG tablet Commonly known as: CLOZARIL  100 mg in AM and 500 mg at night What changed:  how much to take how to take this when to take this additional instructions   cyanocobalamin  1000 MCG tablet Take 1 tablet (1,000 mcg total) by mouth daily.   docusate sodium  100 MG capsule Commonly known as: COLACE Take 100 mg by mouth 2 (two) times daily.   EasyMax Test test strip Generic drug: glucose blood 1  each by Other route as needed.   feeding supplement Liqd Take 237 mLs by mouth 2 (two) times daily with a meal. What changed:  when to take this reasons to take this   finasteride 5 MG tablet Commonly known as: PROSCAR Take 5 mg by mouth daily.   gabapentin  300 MG capsule Commonly known as: NEURONTIN  Take 300 mg by mouth 3 (three) times daily.   glycopyrrolate  1 MG tablet Commonly known as: ROBINUL  Take 1 mg by mouth 2 (two) times daily.   Januvia 25 MG tablet Generic drug: sitaGLIPtin Take 25 mg by mouth daily.   ketoconazole 2 % shampoo Commonly known as: NIZORAL Apply 1 application topically 2 (two) times a week.   latanoprost  0.005 % ophthalmic solution Commonly known as: XALATAN  Place 1 drop into both eyes at bedtime.   Linzess  145 MCG Caps capsule Generic drug: linaclotide  TAKE 1 CAPSULE BY MOUTH ONCE DAILY BEFORE BREAKFAST.   metoprolol  tartrate 25 MG tablet Commonly known as: LOPRESSOR  Take 1 tablet (25 mg total) by mouth 2 (two) times daily.   multivitamin with minerals Tabs tablet Take 1 tablet by mouth daily.   nitroGLYCERIN  0.4 MG SL tablet Commonly known as: NITROSTAT  Place 0.4 mg under the tongue every 5 (five) minutes x 3 doses as needed (if no relief after 3rd dose, procee to ED or call 911).   omeprazole 40 MG capsule Commonly known as: PRILOSEC Take 40 mg by mouth 2 (two) times daily.   polyethylene glycol 17 g packet Commonly known as: MIRALAX  / GLYCOLAX  Take 17 g by mouth daily as needed for moderate constipation.   tamsulosin  0.4 MG Caps capsule Commonly known as: FLOMAX  Take 0.4 mg by mouth daily.   thiamine  100 MG tablet Commonly known as: Vitamin B-1 Take 1 tablet (100 mg total) by mouth daily.   topiramate  25 MG tablet Commonly known as: TOPAMAX  Take 1 tablet (25 mg total) by mouth 2 (two) times daily.   traZODone  50 MG tablet Commonly known as: DESYREL  Take 50 mg by mouth at bedtime.   Tresiba 100 UNIT/ML Soln Generic  drug: Insulin  Degludec Inject 33 Units into the skin at bedtime.   Vitamin D  50 MCG (2000 UT) Caps Take 2,000 Units by mouth daily at 12 noon.        Follow-up Information     Gwenith Shuck, NP Follow up.   Specialty: Nurse Practitioner Why: hospital follow up Contact information: [address] [address] [place] [address] [phone]         Marea Selinda RAMAN, MD Follow up in 3 week(s).   Specialties: Vascular Surgery, Radiology, Interventional Cardiology Why: ENDART ULTRASOUND for AAA repair Contact information: [address] [place] [address] [phone]                Allergies  Allergen Reactions   Penicillins Other (See Comments)    DIZZINESS     Consultations: Vascular surgery   Procedures/Studies: PERIPHERAL VASCULAR CATHETERIZATION Result Date:  09/05/2023 See surgical note for result.  US  Abdomen Limited RUQ (LIVER/GB) Result Date: 09/04/2023 CLINICAL DATA:  Indeterminate liver lesion on recent CT EXAM: ULTRASOUND ABDOMEN LIMITED RIGHT UPPER QUADRANT COMPARISON:  09/04/2023 FINDINGS: Gallbladder: Multiple shadowing gallstones are identified measuring up to 7 mm. No gallbladder wall thickening or pericholecystic fluid. Negative sonographic Murphy sign. Common bile duct: Diameter: 3 mm Liver: There is an 8 x 8 x 11 mm hypoechoic lesion within the anterior right lobe liver, demonstrating mild posterior acoustic enhancement, most consistent with a small cyst. This corresponds to the abnormality on CT. The remainder of the liver is unremarkable. No biliary duct dilation. Portal vein is patent on color Doppler imaging with normal direction of blood flow towards the liver. Other: None. IMPRESSION: 1. Probable 11 mm right lobe liver cyst, corresponding to the CT abnormality. Given the appearance and characteristics, this is likely benign and no specific imaging follow-up is recommended. 2. Cholelithiasis without evidence of cholecystitis. Electronically  Signed   By: Ozell Daring M.D.   On: 09/04/2023 15:33   CT Head Wo Contrast Result Date: 09/04/2023 CLINICAL DATA:  Mental status change, unknown cause. EXAM: CT HEAD WITHOUT CONTRAST TECHNIQUE: Contiguous axial images were obtained from the base of the skull through the vertex without intravenous contrast. RADIATION DOSE REDUCTION: This exam was performed according to the departmental dose-optimization program which includes automated exposure control, adjustment of the mA and/or kV according to patient size and/or use of iterative reconstruction technique. COMPARISON:  CT scan head from 04/29/2022. FINDINGS: Brain: No evidence of acute infarction, hemorrhage, hydrocephalus, extra-axial collection or mass lesion/mass effect. Ventricles are normal. Cerebral volume is age appropriate. Vascular: No hyperdense vessel or unexpected calcification. Skull: Normal. Negative for fracture or focal lesion. Sinuses/Orbits: No acute finding. Other: Visualized mastoid air cells are unremarkable. No mastoid effusion. IMPRESSION: *No acute intracranial abnormality. Electronically Signed   By: Ree Molt M.D.   On: 09/04/2023 10:45   CT ABDOMEN PELVIS W CONTRAST Result Date: 09/04/2023 CLINICAL DATA:  Abdominal pain, acute, nonlocalized.  Weakness. EXAM: CT ABDOMEN AND PELVIS WITH CONTRAST TECHNIQUE: Multidetector CT imaging of the abdomen and pelvis was performed using the standard protocol following bolus administration of intravenous contrast. RADIATION DOSE REDUCTION: This exam was performed according to the departmental dose-optimization program which includes automated exposure control, adjustment of the mA and/or kV according to patient size and/or use of iterative reconstruction technique. CONTRAST:  80mL OMNIPAQUE  IOHEXOL  300 MG/ML  SOLN COMPARISON:  CT scan abdomen and pelvis from 02/23/2014. FINDINGS: Lower chest: There are patchy atelectatic changes in the visualized lung bases. No overt consolidation. No  pleural effusion. The heart is normal in size. No pericardial effusion. Hepatobiliary: The liver is normal in size. Non-cirrhotic configuration. There is an ill-defined approximately 8 x 11 mm hypoattenuating lesion in the right hepatic dome, segment 8, which is incompletely characterized on the current exam. The lesion is likely new since the prior study from 2015. If prior imaging is available, comparison can be made. Otherwise, consider evaluation with ultrasound versus multiphasic contrast-enhanced MRI or CT scan abdomen as per hepatic mass protocol. No intrahepatic or extrahepatic bile duct dilation. Small volume dependent sub 5 mm calcified gallstones noted without imaging signs of acute cholecystitis. Normal gallbladder wall thickness. No pericholecystic inflammatory changes. Pancreas: Unremarkable. No pancreatic ductal dilatation or surrounding inflammatory changes. Spleen: Within normal limits. No focal lesion. Adrenals/Urinary Tract: Adrenal glands are unremarkable. No suspicious renal mass. There are at least 3 nonobstructing calculi in the left  kidney with largest in the lower pole measuring 6 x 11 mm. There are at least 3, 1-2 mm nonobstructing calculi in the right kidney. No obstructive uropathy on either side. Unremarkable urinary bladder. Stomach/Bowel: No disproportionate dilation of the small or large bowel loops. No evidence of abnormal bowel wall thickening or inflammatory changes. The appendix is unremarkable. Vascular/Lymphatic: No ascites or pneumoperitoneum. No abdominal or pelvic lymphadenopathy, by size criteria. There is fusiform aneurysmal dilation of infrarenal aorta measuring up to 9 cm in craniocaudal length. The maximum diameter of aneurysmal sac is up to 6.6 cm. There is large intraluminal thrombus/hematoma. No periaortic fat stranding. 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 There is also moderate intraluminal  thrombus/hematoma. There are mild peripheral atherosclerotic vascular calcifications of the aorta and its major branches. Reproductive: Normal size prostate. Symmetric seminal vesicles. Other: There is a tiny fat containing umbilical hernia. The soft tissues and abdominal wall are otherwise unremarkable. Musculoskeletal: No suspicious osseous lesions. There are mild - moderate multilevel degenerative changes in the visualized spine. 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 IMPRESSION: 1. There is fusiform aneurysmal dilation of infrarenal aorta measuring up to 9 cm in craniocaudal length and 6.6 cm in diameter. 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 Recommend referral to a vascular specialist. 2. There is an ill-defined approximately 8 x 11 mm hypoattenuating lesion in the right hepatic dome, segment 8, which is incompletely characterized on the current exam. If prior imaging is available, comparison can be made. Otherwise, consider evaluation with ultrasound versus multiphasic contrast-enhanced MRI or CT scan abdomen as per hepatic mass protocol. 3. Multiple other nonacute observations, as described above. Aortic aneurysm NOS (ICD10-I71.9). Aortic Atherosclerosis (ICD10-I70.0). Electronically Signed   By: Ree Molt M.D.   On: 09/04/2023 10:42      Subjective: Seen and examined on day of DC.  Stable, appropriate for return to group home.  Discharge Exam: Vitals:   09/06/23 0900 09/06/23 1000  BP: (!) 140/89 (!) 153/97  Pulse: 93 93  Resp: 10 (!) 29  Temp:    SpO2: 100% 100%   Vitals:   09/06/23 0749 09/06/23 0800 09/06/23 0900 09/06/23 1000  BP:  126/79 (!) 140/89 (!) 153/97  Pulse: 88 90 93 93  Resp: 19 19 10  (!) 29  Temp:      TempSrc:      SpO2: 100% 100% 100% 100%  Weight:      Height:        General: Pt is alert, awake, not in acute distress Cardiovascular: RRR, S1/S2 +, no rubs, no  gallops Respiratory: CTA bilaterally, no wheezing, no rhonchi Abdominal: Soft, NT, ND, bowel sounds + Extremities: no edema, no cyanosis    The results of significant diagnostics from this hospitalization (including imaging, microbiology, ancillary and laboratory) are listed below for reference.     Microbiology: Recent Results (from the past 240 hours)  Urine Culture     Status: Abnormal (Preliminary result)   Collection Time: 09/04/23  9:20 AM   Specimen: Urine, Random  Result Value Ref Range Status   Specimen Description   Final    URINE, RANDOM Performed at [facility], [address], [place], [place] [address]    Special Requests   Final    NONE Reflexed from [id number] Performed at [facility], [address] [place], [place]  [address]    Culture (A)  Final    >=100,000 COLONIES/mL CITROBACTER KOSERI SUSCEPTIBILITIES TO FOLLOW Performed at [facility], [address] [place], [place] [address]    Report Status PENDING  Incomplete  Surgical PCR screen     Status: Abnormal   Collection Time: 09/04/23 11:57 PM   Specimen: Nasal Mucosa; Nasal Swab  Result Value Ref Range Status   MRSA, PCR NEGATIVE NEGATIVE Final   Staphylococcus aureus POSITIVE (A) NEGATIVE Final    Comment: (NOTE) The Xpert SA Assay (FDA approved for NASAL specimens in patients 52 years of age and older), is one component of a comprehensive surveillance program. It is not intended to diagnose infection nor to guide or monitor treatment. Performed at [facility], [address] [place], [place] [address]      Labs: BNP (last 3 results) No results for input(s): BNP in the last 8760 hours. Basic Metabolic Panel: Recent Labs  Lab 09/04/23 0916 09/05/23 0435  NA 142 141  K 3.8 4.0  CL 110 110  CO2 23 22  GLUCOSE 82 101*  BUN 17 15  CREATININE 1.61* 1.27*  CALCIUM  9.1 8.9   Liver Function Tests: Recent Labs  Lab 09/04/23 0916  AST 27  ALT 20   ALKPHOS 78  BILITOT 0.9  PROT 6.7  ALBUMIN 3.7   Recent Labs  Lab 09/04/23 0916  LIPASE 25   No results for input(s): AMMONIA in the last 168 hours. CBC: Recent Labs  Lab 09/04/23 0916 09/05/23 0435  WBC 6.9 6.2  NEUTROABS 4.6  --   HGB 13.0 11.2*  HCT 38.3* 33.9*  MCV 88.9 87.8  PLT 111* 108*   Cardiac Enzymes: No results for input(s): CKTOTAL, CKMB, CKMBINDEX, TROPONINI in the last 168 hours. BNP: Invalid input(s): POCBNP CBG: Recent Labs  Lab 09/05/23 1449 09/05/23 1541 09/05/23 1802 09/05/23 2205 09/06/23 0734  GLUCAP 117* 128* 120* 254* 135*   D-Dimer No results for input(s): DDIMER in the last 72 hours. Hgb A1c Recent Labs    09/04/23 0916  HGBA1C 5.8*   Lipid Profile No results for input(s): CHOL, HDL, LDLCALC, TRIG, CHOLHDL, LDLDIRECT in the last 72 hours. Thyroid function studies No results for input(s): TSH, T4TOTAL, T3FREE, THYROIDAB in the last 72 hours.  Invalid input(s): FREET3 Anemia work up No results for input(s): VITAMINB12, FOLATE, FERRITIN, TIBC, IRON, RETICCTPCT in the last 72 hours. Urinalysis    Component Value Date/Time   COLORURINE YELLOW (A) 09/04/2023 0920   APPEARANCEUR HAZY (A) 09/04/2023 0920   APPEARANCEUR Clear 07/08/2013 1606   LABSPEC 1.011 09/04/2023 0920   LABSPEC 1.010 07/08/2013 1606   PHURINE 6.0 09/04/2023 0920   GLUCOSEU NEGATIVE 09/04/2023 0920   GLUCOSEU Negative 07/08/2013 1606   HGBUR NEGATIVE 09/04/2023 0920   BILIRUBINUR NEGATIVE 09/04/2023 0920   BILIRUBINUR Negative 07/08/2013 1606   KETONESUR NEGATIVE 09/04/2023 0920   PROTEINUR NEGATIVE 09/04/2023 0920   UROBILINOGEN 0.2 02/22/2014 1853   NITRITE NEGATIVE 09/04/2023 0920   LEUKOCYTESUR MODERATE (A) 09/04/2023 0920   LEUKOCYTESUR 3+ 07/08/2013 1606   Sepsis Labs Recent Labs  Lab 09/04/23 0916 09/05/23 0435  WBC 6.9 6.2   Microbiology Recent Results (from the past 240 hours)  Urine  Culture     Status: Abnormal (Preliminary result)   Collection Time: 09/04/23  9:20 AM   Specimen: Urine, Random  Result Value Ref Range Status   Specimen Description   Final    URINE, RANDOM Performed at [facility], [address] [place], [place] [address]    Special Requests   Final    NONE Reflexed from [id number] Performed at [facility], [address] [place], [place] [address]    Culture (A)  Final    >=100,000 COLONIES/mL CITROBACTER KOSERI SUSCEPTIBILITIES TO FOLLOW Performed at [facility], [address], [place], [place] [address]    Report Status PENDING  Incomplete  Surgical PCR screen     Status: Abnormal   Collection Time: 09/04/23 11:57 PM   Specimen: Nasal Mucosa; Nasal Swab  Result Value Ref Range Status   MRSA, PCR NEGATIVE NEGATIVE Final   Staphylococcus aureus POSITIVE (A) NEGATIVE Final    Comment: (NOTE) The Xpert SA Assay (FDA approved for NASAL specimens in patients 47 years of age and older), is one component of a comprehensive surveillance program. It is not intended to diagnose infection nor to guide or monitor treatment. Performed at [facility], [address] [place], [place] [address]      Time coordinating discharge: 40 minutes   SIGNED:   Calvin KATHEE Robson, MD  [facility] 09/06/2023, 11:02 AM Pager   If 7PM-7AM, please contact night-coverage

## 2023-09-06 NOTE — Progress Notes (Signed)
 Report called to [facility] at this time.

## 2023-09-07 LAB — URINE CULTURE: Culture: >=100000 — AB

## 2023-09-12 ENCOUNTER — Encounter: Payer: Self-pay | Admitting: Vascular Surgery

## 2023-09-23 ENCOUNTER — Other Ambulatory Visit: Payer: Self-pay

## 2023-09-23 ENCOUNTER — Emergency Department
Admission: EM | Admit: 2023-09-23 | Discharge: 2023-09-24 | Disposition: A | Discharge status: Home / Self Care | Attending: Emergency Medicine | Admitting: Emergency Medicine

## 2023-09-23 ENCOUNTER — Emergency Department

## 2023-09-23 DIAGNOSIS — M79604 Pain in right leg: Secondary | ICD-10-CM | POA: Insufficient documentation

## 2023-09-23 DIAGNOSIS — N390 Urinary tract infection, site not specified: Secondary | ICD-10-CM | POA: Insufficient documentation

## 2023-09-23 DIAGNOSIS — I129 Hypertensive chronic kidney disease with stage 1 through stage 4 chronic kidney disease, or unspecified chronic kidney disease: Secondary | ICD-10-CM | POA: Diagnosis not present

## 2023-09-23 DIAGNOSIS — M79605 Pain in left leg: Secondary | ICD-10-CM | POA: Insufficient documentation

## 2023-09-23 DIAGNOSIS — R519 Headache, unspecified: Secondary | ICD-10-CM | POA: Diagnosis not present

## 2023-09-23 DIAGNOSIS — F17200 Nicotine dependence, unspecified, uncomplicated: Secondary | ICD-10-CM | POA: Diagnosis not present

## 2023-09-23 DIAGNOSIS — N189 Chronic kidney disease, unspecified: Secondary | ICD-10-CM | POA: Diagnosis not present

## 2023-09-23 DIAGNOSIS — R42 Dizziness and giddiness: Secondary | ICD-10-CM | POA: Diagnosis present

## 2023-09-23 DIAGNOSIS — E1122 Type 2 diabetes mellitus with diabetic chronic kidney disease: Secondary | ICD-10-CM | POA: Diagnosis not present

## 2023-09-23 LAB — URINALYSIS, ROUTINE W REFLEX MICROSCOPIC
Bilirubin Urine: NEGATIVE
Glucose, UA: NEGATIVE mg/dL
Hgb urine dipstick: NEGATIVE
Ketones, ur: NEGATIVE mg/dL
Nitrite: NEGATIVE
Protein, ur: NEGATIVE mg/dL
Specific Gravity, Urine: 1.017 (ref 1.005–1.030)
WBC, UA: >50 WBC/hpf (ref 0–5)
pH: 5 (ref 5.0–8.0)

## 2023-09-23 LAB — COMPREHENSIVE METABOLIC PANEL WITH GFR
ALT: 14 U/L (ref 0–44)
AST: 22 U/L (ref 15–41)
Albumin: 3.3 g/dL — ABNORMAL LOW (ref 3.5–5.0)
Alkaline Phosphatase: 97 U/L (ref 38–126)
Anion gap: 11 (ref 5–15)
BUN: 19 mg/dL (ref 8–23)
CO2: 21 mmol/L — ABNORMAL LOW (ref 22–32)
Calcium: 9.1 mg/dL (ref 8.9–10.3)
Chloride: 113 mmol/L — ABNORMAL HIGH (ref 98–111)
Creatinine, Ser: 1.75 mg/dL — ABNORMAL HIGH (ref 0.61–1.24)
GFR, Estimated: 43 mL/min — ABNORMAL LOW (ref >60)
Glucose, Bld: 108 mg/dL — ABNORMAL HIGH (ref 70–99)
Potassium: 3.9 mmol/L (ref 3.5–5.1)
Sodium: 145 mmol/L (ref 135–145)
Total Bilirubin: 0.3 mg/dL (ref 0.0–1.2)
Total Protein: 6.8 g/dL (ref 6.5–8.1)

## 2023-09-23 LAB — CBC
HCT: 34.2 % — ABNORMAL LOW (ref 39.0–52.0)
Hemoglobin: 11 g/dL — ABNORMAL LOW (ref 13.0–17.0)
MCH: 29.2 pg (ref 26.0–34.0)
MCHC: 32.2 g/dL (ref 30.0–36.0)
MCV: 90.7 fL (ref 80.0–100.0)
Platelets: 190 K/uL (ref 150–400)
RBC: 3.77 MIL/uL — ABNORMAL LOW (ref 4.22–5.81)
RDW: 15.5 % (ref 11.5–15.5)
WBC: 7.3 K/uL (ref 4.0–10.5)
nRBC: 0 % (ref 0.0–0.2)

## 2023-09-23 MED ORDER — ACETAMINOPHEN 325 MG PO TABS
650.0000 mg | ORAL_TABLET | Freq: Once | ORAL | Status: AC
  Administered 2023-09-23: 650 mg via ORAL
  Filled 2023-09-23: qty 2

## 2023-09-23 MED ORDER — CEFUROXIME AXETIL 250 MG PO TABS: 250.0000 mg | ORAL_TABLET | Freq: Two times a day (BID) | ORAL | 0 refills | Status: DC

## 2023-09-23 MED ORDER — CEFUROXIME AXETIL 250 MG PO TABS
250.0000 mg | ORAL_TABLET | Freq: Once | ORAL | Status: AC
  Administered 2023-09-24: 250 mg via ORAL
  Filled 2023-09-23: qty 1

## 2023-09-23 NOTE — ED Provider Triage Note (Signed)
 Emergency Medicine Provider Triage Evaluation Note  Oscar Elliott , a 63 y.o. male  was evaluated in triage.  Pt complains of headache.  Patient was brought from [place] home [facility].  Patient states he feels weak, dizzy, had an episode of blacking out.  Patient denies falls, head trauma or taking blood thinners.  Patient states changes in vision but they are no new.  Review of Systems  Positive:  Negative:   Physical Exam  There were no vitals taken for this visit. Gen:   Awake, no distress   Resp:  Normal effort  MSK:   Moves extremities without difficulty  Other:    Medical Decision Making  Medically screening exam initiated at 3:41 PM.  Appropriate orders placed.  Oscar Elliott was informed that the remainder of the evaluation will be completed by another provider, this initial triage assessment does not replace that evaluation, and the importance of remaining in the ED until their evaluation is complete.  Patient who presents today with history of headache, weakness, dizziness.  No history of head trauma, is not taking blood thinners.  There is CBC CMP   Oscar Kast, PA-C 09/23/23 1542

## 2023-09-23 NOTE — ED Notes (Signed)
 Staff to call for D/C, Leotis [phone]

## 2023-09-23 NOTE — ED Notes (Signed)
 Pt reports 10/10 HA; bradler, MD notified, see MAR. Pt provided w/ PB& crackers per request. Warm blanket provided. Call light within reach.

## 2023-09-23 NOTE — ED Triage Notes (Signed)
 Pt to ED for HA and dizziness since last week. Pt lives at [facility] and came via EMS. Pt states he blacked out today and that his legs hurt. Pt states he needs new glasses. Pt is AKA to L leg. Pt is in own wheelchair.

## 2023-09-23 NOTE — ED Triage Notes (Signed)
 First nurse note: Pt here via [facility] from [facility], pt was picked up from a group meeting.   110/60 HR: 80 97% RA  CBG 137, HX of DM.

## 2023-09-23 NOTE — ED Provider Notes (Signed)
 [facility] Provider Note   Event Date/Time   First MD Initiated Contact with Patient 09/23/23 1953     (approximate) History  Headache and Dizziness  HPI Oscar Elliott is a 63 y.o. male with a past medical history of hypertension, schizophrenia, tobacco abuse, type 2 diabetes, CKD, hyperlipidemia, and peripheral vascular disease who presents complaining of an episode of loss of consciousness.  Patient states that his vision went black however he was able to hear everything around him.  Patient also complains of bilateral leg aching that has been present over the last 48 hours.  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  Patient denies any subsequent loss of consciousness, nausea/vomiting, headache, or fever. ROS: Patient currently denies any vision changes, tinnitus, difficulty speaking, facial droop, sore throat, chest pain, shortness of breath, abdominal pain, nausea/vomiting/diarrhea, dysuria, or weakness/numbness/paresthesias in any extremity   Physical Exam  Triage Vital Signs: ED Triage Vitals  Encounter Vitals Group     BP 09/23/23 1545 118/81     Girls Systolic BP Percentile --      Girls Diastolic BP Percentile --      Boys Systolic BP Percentile --      Boys Diastolic BP Percentile --      Pulse Rate 09/23/23 1545 92     Resp 09/23/23 1545 20     Temp 09/23/23 1545 97.9 F (36.6 C)     Temp Source 09/23/23 1545 Oral     SpO2 09/23/23 1545 100 %     Weight 09/23/23 1543 115 lb (52.2 kg)     Height 09/23/23 1543 5' 5 (1.651 m)     Head Circumference --      Peak Flow --      Pain Score 09/23/23 1541 10     Pain Loc --      Pain Education --      Exclude from Growth Chart --    Most recent vital signs: Vitals:   09/23/23 2000 09/23/23 2229  BP: (!) 147/87 (!) 157/102  Pulse: 88 86  Resp: (!) 24 (!) 25  Temp:  97.7 F (36.5 C)  SpO2: 99% 100%   General: Awake,  oriented x4. CV:  Good peripheral perfusion. Resp:  Normal effort. Abd:  No distention. Other:  Elderly well-developed, well-nourished African-American male resting comfortably in no acute distress.  Left AKA ED Results / Procedures / Treatments  Labs (all labs ordered are listed, but only abnormal results are displayed) Labs Reviewed  COMPREHENSIVE METABOLIC PANEL WITH GFR - Abnormal; Notable for the following components:      Result Value   Chloride 113 (*)    CO2 21 (*)    Glucose, Bld 108 (*)    Creatinine, Ser 1.75 (*)    Albumin 3.3 (*)    GFR, Estimated 43 (*)    All other components within normal limits  CBC - Abnormal; Notable for the following components:   RBC 3.77 (*)    Hemoglobin 11.0 (*)    HCT 34.2 (*)    All other components within normal limits  URINALYSIS, ROUTINE W REFLEX MICROSCOPIC - Abnormal; Notable for the following components:   Color, Urine AMBER (*)    APPearance HAZY (*)    Leukocytes,Ua MODERATE (*)    Bacteria, UA MANY (*)    All other components within normal limits  CBG MONITORING, ED   EKG  ED ECG REPORT I, Artist MARLA Kerns, the attending physician, personally viewed and interpreted this ECG. Date: 09/23/2023 EKG Time: 1555 Rate: 90 Rhythm: normal sinus rhythm QRS Axis: normal Intervals: normal ST/T Wave abnormalities: normal Narrative Interpretation: no evidence of acute ischemia RADIOLOGY ED MD interpretation: CT of the head without contrast interpreted by me shows no evidence of acute abnormalities including no intracerebral hemorrhage, obvious masses, or significant edema - All radiology independently interpreted and agree with radiology assessment Official radiology report(s): CT Head Wo Contrast Result Date: 09/23/2023 CLINICAL DATA:  Altered mental status EXAM: CT HEAD WITHOUT CONTRAST TECHNIQUE: Contiguous axial images were obtained from the base of the skull through the vertex without intravenous contrast. RADIATION DOSE  REDUCTION: This exam was performed according to the departmental dose-optimization program which includes automated exposure control, adjustment of the mA and/or kV according to patient size and/or use of iterative reconstruction technique. COMPARISON:  09/04/2023 FINDINGS: Brain: No evidence of acute infarction, hemorrhage, hydrocephalus, extra-axial collection or mass lesion/mass effect. Vascular: No hyperdense vessel or unexpected calcification. Skull: Normal. Negative for fracture or focal lesion. Sinuses/Orbits: No acute finding. Other: None. IMPRESSION: No acute intracranial abnormality noted. Electronically Signed   By: Oneil Devonshire M.D.   On: 09/23/2023 23:13   PROCEDURES: Critical Care performed: No Procedures MEDICATIONS ORDERED IN ED: Medications  cefUROXime  (CEFTIN ) tablet 250 mg (has no administration in time range)  acetaminophen  (TYLENOL ) tablet 650 mg (650 mg Oral Given 09/23/23 2236)   IMPRESSION / MDM / ASSESSMENT AND PLAN / ED COURSE  I reviewed the triage vital signs and the nursing notes.                             The patient is on the cardiac monitor to evaluate for evidence of arrhythmia and/or significant heart rate changes. Patient's presentation is most consistent with acute presentation with potential threat to life or bodily function. No e/o epididymo-orchitis on exam and low suspicion for rectal abscess, prostatitis, other GU deep space infection, gonorrhea/chlamydia. Unlikely Infected Urolithiasis, AAA, cholecystitis, pancreatitis, SBO, appendicitis, or other acute abdomen. Workup: UA: None Rx: Ceftin  250 mg twice daily x5 days  Disposition: Discharge home. SRP discussed. Advise follow up with primary care provider within 24-72 hours.   FINAL CLINICAL IMPRESSION(S) / ED DIAGNOSES   Final diagnoses:  Lightheadedness  Urinary tract infection without hematuria, site unspecified   Rx / DC Orders   ED Discharge Orders          Ordered    cefUROXime  (CEFTIN )  250 MG tablet  2 times daily with meals        09/23/23 2332           Note:  This document was prepared using Dragon voice recognition software and may include unintentional dictation errors.   Kerns Artist MARLA, MD 09/23/23 [phone]

## 2023-09-24 ENCOUNTER — Telehealth: Payer: Self-pay | Admitting: Emergency Medicine

## 2023-09-24 ENCOUNTER — Other Ambulatory Visit (INDEPENDENT_AMBULATORY_CARE_PROVIDER_SITE_OTHER): Payer: Self-pay | Admitting: Vascular Surgery

## 2023-09-24 DIAGNOSIS — R42 Dizziness and giddiness: Secondary | ICD-10-CM | POA: Diagnosis not present

## 2023-09-24 DIAGNOSIS — I714 Abdominal aortic aneurysm, without rupture, unspecified: Secondary | ICD-10-CM

## 2023-09-24 MED ORDER — CEFUROXIME AXETIL 250 MG PO TABS: 250.0000 mg | ORAL_TABLET | Freq: Two times a day (BID) | ORAL | 0 refills | Status: DC

## 2023-09-24 NOTE — ED Notes (Signed)
 [facility] unable to take pt due to having a wheelchair. Spoke w/ robyn at pt's facility who verbalizes understanding and reports a staff member will be by in the morning to pick up the patient. Pt resting at this time.

## 2023-09-24 NOTE — ED Notes (Signed)
 Pt's group home staff member, robynn, called and reported she ordered a cab for pt to return to the facility.  [employer] company contacted and they reported their ETA is 1 hour. Their number is [phone]

## 2023-09-24 NOTE — Telephone Encounter (Signed)
 Patient requested antibiotics be sent to a different pharmacy.

## 2023-09-24 NOTE — ED Notes (Signed)
 [facility] arrived around 1am. Unable to transport pt due to having wheel chair at hosp with him. Can not arrive back without it

## 2023-09-25 ENCOUNTER — Ambulatory Visit (INDEPENDENT_AMBULATORY_CARE_PROVIDER_SITE_OTHER)

## 2023-09-25 DIAGNOSIS — I714 Abdominal aortic aneurysm, without rupture, unspecified: Secondary | ICD-10-CM | POA: Diagnosis not present

## 2023-09-26 ENCOUNTER — Ambulatory Visit (INDEPENDENT_AMBULATORY_CARE_PROVIDER_SITE_OTHER): Admitting: Vascular Surgery

## 2023-09-26 ENCOUNTER — Encounter (INDEPENDENT_AMBULATORY_CARE_PROVIDER_SITE_OTHER): Payer: Self-pay | Admitting: Vascular Surgery

## 2023-09-26 VITALS — BP 135/88 | HR 86 | Ht 63.0 in | Wt 119.2 lb

## 2023-09-26 DIAGNOSIS — E119 Type 2 diabetes mellitus without complications: Secondary | ICD-10-CM | POA: Diagnosis not present

## 2023-09-26 DIAGNOSIS — I10 Essential (primary) hypertension: Secondary | ICD-10-CM

## 2023-09-26 DIAGNOSIS — Z794 Long term (current) use of insulin: Secondary | ICD-10-CM

## 2023-09-26 DIAGNOSIS — I714 Abdominal aortic aneurysm, without rupture, unspecified: Secondary | ICD-10-CM

## 2023-09-26 NOTE — Progress Notes (Signed)
 Subjective:    Patient ID: Oscar Elliott, male    DOB: [birth year], 63 y.o.   MRN: [id number] Chief Complaint  Patient presents with   Follow-up    Oscar Elliott presents to clinic to post Procedure:   PROCEDURE: 1. US  guidance for vascular access, bilateral femoral arteries 2. Catheter placement into aorta from bilateral femoral approaches 3. Catheter placement to left internal iliac artery from left femoral approach 4. Coil embolization of the left hypogastric artery without 3 Ruby coils 5. Placement of a 28 mm proximal conformable C3 Gore Excluder Endoprosthesis main body right with a 14 mm diameter by 10 cm length left iliac contralateral limb 6. Placement of the right iliac extender with an 18 mm diameter by 10 cm length limb 7. 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 ProGlide closure devices bilateral femoral arteries  Patient endorses he is feeling well today. He has no complaints. Denies any abdominal pain or difficulties eating.  No complaints related to his right lower extremity. Patient with history of left AKA. Patient endorses his blood sugars are in good control.     Review of Systems  Constitutional: Negative.   Musculoskeletal:        Patient uses wheel chair only as he has Left AKA   All other systems reviewed and are negative.      Objective:   Physical Exam Vitals reviewed.  Constitutional:      Appearance: Normal appearance. He is normal weight.  HENT:     Head: Normocephalic.  Eyes:     Pupils: Pupils are equal, round, and reactive to light.  Cardiovascular:     Rate and Rhythm: Normal rate and regular rhythm.     Pulses: Normal pulses.     Heart sounds: Normal heart sounds.  Pulmonary:     Effort: Pulmonary effort is normal.     Breath sounds: Normal breath sounds.  Abdominal:     General: Abdomen is  flat. Bowel sounds are normal.     Palpations: Abdomen is soft.  Musculoskeletal:     Comments: Hx of left AKA   Skin:    General: Skin is warm and dry.     Capillary Refill: Capillary refill takes 2 to 3 seconds.  Neurological:     General: No focal deficit present.     Mental Status: He is alert and oriented to person, place, and time. Mental status is at baseline.     Gait: Gait abnormal.  Psychiatric:        Mood and Affect: Mood normal.        Behavior: Behavior normal.     BP 135/88   Pulse 86   Ht 5' 3 (1.6 m)   Wt 119 lb 4 oz (54.1 kg)   BMI 21.12 kg/m   Past Medical History:  Diagnosis Date   CAD (coronary artery disease)    a. 10/2013 Lateral STEMI/PCI: LM nl, LAD min irregs, D1 occluded sub branch, LCX min irregs mid, OM large/patent, RCA 128m (2.75x28 Promus DES).   Cholelithiasis    CKD (chronic kidney disease)    Essential hypertension    Hyperlipidemia    MI (myocardial infarction) (HCC)    Nephrolithiasis    Peripheral vascular disease (HCC)    a. 06/2007: s/p L AKA @ [facility]   Schizophrenia (HCC)  Type 2 diabetes mellitus (HCC)     Social History   Socioeconomic History   Marital status: Single    Spouse name: Not on file   Number of children: Not on file   Years of education: Not on file   Highest education level: Not on file  Occupational History   Not on file  Tobacco Use   Smoking status: Every Day    Current packs/day: 1.00    Average packs/day: 1 pack/day for 20.0 years (20.0 ttl pk-yrs)    Types: Cigars, Cigarettes   Smokeless tobacco: Never   Tobacco comments:    1-2 cig daily  Vaping Use   Vaping status: Never Used  Substance and Sexual Activity   Alcohol  use: No    Alcohol /week: 0.0 standard drinks of alcohol    Drug use: No   Sexual activity: Never  Other Topics Concern   Not on file  Social History Narrative   Single, no children      Currently living in a group home      Outpatient psychiatrist month Dr. Susann     Social Drivers of Health   Financial Resource Strain: Not on file  Food Insecurity: No Food Insecurity (09/04/2023)   Hunger Vital Sign    Worried About Running Out of Food in the Last Year: Never true    Ran Out of Food in the Last Year: Never true  Transportation Needs: No Transportation Needs (09/04/2023)   PRAPARE - Administrator, Civil Service (Medical): No    Lack of Transportation (Non-Medical): No  Physical Activity: Not on file  Stress: Not on file  Social Connections: Unknown (09/04/2023)   Social Connection and Isolation Panel    Frequency of Communication with Friends and Family: Once a week    Frequency of Social Gatherings with Friends and Family: Once a week    Attends Religious Services: Never    Database administrator or Organizations: No    Attends Banker Meetings: Never    Marital Status: Not on file  Intimate Partner Violence: Not At Risk (09/04/2023)   Humiliation, Afraid, Rape, and Kick questionnaire    Fear of Current or Ex-Partner: No    Emotionally Abused: No    Physically Abused: No    Sexually Abused: No    Past Surgical History:  Procedure Laterality Date   Above-the-knee amputation Left    COLONOSCOPY WITH PROPOFOL  N/A 12/09/2018   fields: nonthrombosed hemorrhoids, decreased anal sphincter tone. poor prep, colonoscopy aborted   ENDOVASCULAR REPAIR/STENT GRAFT N/A 09/05/2023   Procedure: ENDOVASCULAR REPAIR/STENT GRAFT;  Surgeon: Oscar Selinda RAMAN, MD;  Location: [facility] INVASIVE CV LAB;  Service: Cardiovascular;  Laterality: N/A;  with coil embolization of left internal iliac artery   FLEXIBLE SIGMOIDOSCOPY  11/17/2019   Procedure: FLEXIBLE SIGMOIDOSCOPY;  Surgeon: Oscar Carlin POUR, DO;  Location: [facility];  Service: Endoscopy;;   LEFT HEART CATHETERIZATION WITH CORONARY ANGIOGRAM N/A 11/20/2013   Procedure: LEFT HEART CATHETERIZATION WITH CORONARY ANGIOGRAM;  Surgeon: Oscar Elliott Reek, MD;  Location: [facility] CATH LAB;  Service:  Cardiovascular;  Laterality: N/A;    Family History  Problem Relation Age of Onset   Other Other        no premature CAD.   Seizures Neg Hx     Allergies  Allergen Reactions   Penicillins Other (See Comments)    DIZZINESS        Latest Ref Rng & Units 09/23/2023    3:47  PM 09/05/2023    4:35 AM 09/04/2023    9:16 AM  CBC  WBC 4.0 - 10.5 K/uL 7.3  6.2  6.9   Hemoglobin 13.0 - 17.0 g/dL 88.9  88.7  86.9   Hematocrit 39.0 - 52.0 % 34.2  33.9  38.3   Platelets 150 - 400 K/uL 190  108  111       CMP     Component Value Date/Time   NA 145 09/23/2023 1547   NA 142 12/26/2022 1032   NA 139 07/08/2013 1428   K 3.9 09/23/2023 1547   K 3.9 07/08/2013 1428   CL 113 (H) 09/23/2023 1547   CL 108 (H) 07/08/2013 1428   CO2 21 (L) 09/23/2023 1547   CO2 24 07/08/2013 1428   GLUCOSE 108 (H) 09/23/2023 1547   GLUCOSE 63 (L) 07/08/2013 1428   BUN 19 09/23/2023 1547   BUN 17 12/26/2022 1032   BUN 19 (H) 07/08/2013 1428   CREATININE 1.75 (H) 09/23/2023 1547   CREATININE 1.46 (H) 07/08/2013 1428   CALCIUM  9.1 09/23/2023 1547   CALCIUM  9.2 07/08/2013 1428   PROT 6.8 09/23/2023 1547   PROT 7.0 12/26/2022 1032   PROT 7.7 07/08/2013 1428   ALBUMIN 3.3 (L) 09/23/2023 1547   ALBUMIN 4.3 12/26/2022 1032   ALBUMIN 3.7 07/08/2013 1428   AST 22 09/23/2023 1547   AST 29 07/08/2013 1428   ALT 14 09/23/2023 1547   ALT 19 07/08/2013 1428   ALKPHOS 97 09/23/2023 1547   ALKPHOS 95 07/08/2013 1428   BILITOT 0.3 09/23/2023 1547   BILITOT 0.3 12/26/2022 1032   BILITOT 0.4 07/08/2013 1428   EGFR 51 (L) 12/26/2022 1032   GFRNONAA 43 (L) 09/23/2023 1547   GFRNONAA 54 (L) 07/08/2013 1428     No results found.     Assessment & Plan:   1. Abdominal aortic aneurysm (AAA) without rupture, unspecified part (HCC) (Primary) Patient presents to clinic today post AAA repair on 09/05/23. No complaints today. Vitals all remain stable. Patient lives in group home and is getting all his medications.  EVAR ultrasound completed is all within normal range and without an endo leak. Patient to follow up in 3 months with repeat Ultrasound.   2. Essential hypertension Continue antihypertensive medications as already ordered, these medications have been reviewed and there are no changes at this time.  3. Insulin  dependent type 2 diabetes mellitus (HCC) Continue hypoglycemic medications as already ordered, these medications have been reviewed and there are no changes at this time.  Hgb A1C to be monitored as already arranged by primary service   Current Outpatient Medications on File Prior to Visit  Medication Sig Dispense Refill   acetaminophen  (TYLENOL ) 500 MG tablet Take 500 mg by mouth every 4 (four) hours as needed for moderate pain.     aspirin  EC 81 MG tablet Take 1 tablet (81 mg total) by mouth daily. Swallow whole. 100 tablet 0   atropine 1 % ophthalmic solution Place 1-2 drops under the tongue every 4 (four) hours as needed (secretions).     busPIRone (BUSPAR) 10 MG tablet Take 10 mg by mouth 2 (two) times daily.     cefUROXime  (CEFTIN ) 250 MG tablet Take 1 tablet (250 mg total) by mouth 2 (two) times daily with a meal. 14 tablet 0   Cholecalciferol (VITAMIN D ) 50 MCG (2000 UT) CAPS Take 2,000 Units by mouth daily at 12 noon.     clopidogrel  (PLAVIX ) 75 MG tablet Take  1 tablet (75 mg total) by mouth daily. 60 tablet 0   cloZAPine  (CLOZARIL ) 100 MG tablet 100 mg in AM and 500 mg at night (Patient taking differently: Take 100-500 mg by mouth See admin instructions. Take 100 mg by mouth in morning and 500 mg at night) 180 tablet 0   cyanocobalamin  1000 MCG tablet Take 1 tablet (1,000 mcg total) by mouth daily.     docusate sodium  (COLACE) 100 MG capsule Take 100 mg by mouth 2 (two) times daily.     EASYMAX TEST test strip 1 each by Other route as needed.     feeding supplement (ENSURE ENLIVE / ENSURE PLUS) LIQD Take 237 mLs by mouth 2 (two) times daily with a meal. (Patient taking  differently: Take 237 mLs by mouth 2 (two) times daily as needed.) 237 mL 12   finasteride (PROSCAR) 5 MG tablet Take 5 mg by mouth daily.     gabapentin  (NEURONTIN ) 300 MG capsule Take 300 mg by mouth 3 (three) times daily.     glycopyrrolate  (ROBINUL ) 1 MG tablet Take 1 mg by mouth 2 (two) times daily.     Insulin  Degludec (TRESIBA) 100 UNIT/ML SOLN Inject 33 Units into the skin at bedtime.     JANUVIA 25 MG tablet Take 25 mg by mouth daily.     ketoconazole (NIZORAL) 2 % shampoo Apply 1 application topically 2 (two) times a week.     latanoprost  (XALATAN ) 0.005 % ophthalmic solution Place 1 drop into both eyes at bedtime.     LINZESS  145 MCG CAPS capsule TAKE 1 CAPSULE BY MOUTH ONCE DAILY BEFORE BREAKFAST. 30 capsule 10   Multiple Vitamin (MULTIVITAMIN WITH MINERALS) TABS tablet Take 1 tablet by mouth daily. 30 tablet 2   nitroGLYCERIN  (NITROSTAT ) 0.4 MG SL tablet Place 0.4 mg under the tongue every 5 (five) minutes x 3 doses as needed (if no relief after 3rd dose, procee to ED or call 911).     omeprazole (PRILOSEC) 40 MG capsule Take 40 mg by mouth 2 (two) times daily.      polyethylene glycol (MIRALAX  / GLYCOLAX ) 17 g packet Take 17 g by mouth daily as needed for moderate constipation. 14 each 10   tamsulosin  (FLOMAX ) 0.4 MG CAPS capsule Take 0.4 mg by mouth daily.     thiamine  (VITAMIN B-1) 100 MG tablet Take 1 tablet (100 mg total) by mouth daily. 30 tablet 2   topiramate  (TOPAMAX ) 25 MG tablet Take 1 tablet (25 mg total) by mouth 2 (two) times daily. 180 tablet 3   traZODone  (DESYREL ) 50 MG tablet Take 50 mg by mouth at bedtime.     atorvastatin  (LIPITOR ) 80 MG tablet Take 1 tablet (80 mg total) by mouth daily. 90 tablet 3   metoprolol  tartrate (LOPRESSOR ) 25 MG tablet Take 1 tablet (25 mg total) by mouth 2 (two) times daily. 180 tablet 3   No current facility-administered medications on file prior to visit.    There are no Patient Instructions on file for this visit. No follow-ups  on file.   Gwendlyn JONELLE Shank, NP

## 2023-09-30 ENCOUNTER — Other Ambulatory Visit: Payer: Self-pay | Admitting: Physician Assistant

## 2023-10-10 ENCOUNTER — Other Ambulatory Visit: Payer: Self-pay | Admitting: Physician Assistant

## 2023-10-11 MED ORDER — CLOPIDOGREL BISULFATE 75 MG PO TABS: 75.0000 mg | ORAL_TABLET | Freq: Every day | ORAL | 0 refills | Status: AC

## 2023-12-18 ENCOUNTER — Other Ambulatory Visit: Payer: Self-pay | Admitting: Physician Assistant

## 2023-12-19 ENCOUNTER — Other Ambulatory Visit: Payer: Self-pay

## 2023-12-19 ENCOUNTER — Emergency Department
Admission: EM | Admit: 2023-12-19 | Discharge: 2023-12-19 | Disposition: A | Discharge status: Home / Self Care | Attending: Emergency Medicine | Admitting: Emergency Medicine

## 2023-12-19 DIAGNOSIS — I2510 Atherosclerotic heart disease of native coronary artery without angina pectoris: Secondary | ICD-10-CM | POA: Insufficient documentation

## 2023-12-19 DIAGNOSIS — Z8659 Personal history of other mental and behavioral disorders: Secondary | ICD-10-CM

## 2023-12-19 DIAGNOSIS — E1122 Type 2 diabetes mellitus with diabetic chronic kidney disease: Secondary | ICD-10-CM | POA: Insufficient documentation

## 2023-12-19 DIAGNOSIS — M79604 Pain in right leg: Secondary | ICD-10-CM | POA: Diagnosis present

## 2023-12-19 DIAGNOSIS — M7918 Myalgia, other site: Secondary | ICD-10-CM

## 2023-12-19 DIAGNOSIS — M79605 Pain in left leg: Secondary | ICD-10-CM | POA: Insufficient documentation

## 2023-12-19 DIAGNOSIS — F209 Schizophrenia, unspecified: Secondary | ICD-10-CM | POA: Diagnosis not present

## 2023-12-19 DIAGNOSIS — N189 Chronic kidney disease, unspecified: Secondary | ICD-10-CM | POA: Insufficient documentation

## 2023-12-19 DIAGNOSIS — R519 Headache, unspecified: Secondary | ICD-10-CM | POA: Diagnosis not present

## 2023-12-19 MED ORDER — ACETAMINOPHEN 500 MG PO TABS
1000.0000 mg | ORAL_TABLET | Freq: Once | ORAL | Status: AC
  Administered 2023-12-19: 1000 mg via ORAL
  Filled 2023-12-19: qty 2

## 2023-12-19 NOTE — ED Notes (Signed)
 RN attempted to contact pt legal guardian Oscar Elliott. The phone number that is provided is a fax number.

## 2023-12-19 NOTE — ED Triage Notes (Addendum)
 First nurse note:  [facility] EMS unsure of where pt resides at. Triage note on 7/28 reports pt resides at [facility]. Pt reports right leg pain and HA that started today. Pt is using his own wheelchair.   CBG 213 98.2 oral 126/79 78 HR

## 2023-12-19 NOTE — ED Triage Notes (Signed)
 [facility]. Pt sts that he has been having right leg pain for the last two months.

## 2023-12-19 NOTE — Discharge Instructions (Addendum)
You can take 650 mg of Tylenol every 6 hours as needed for pain. 

## 2023-12-19 NOTE — ED Provider Notes (Signed)
 [facility] Provider Note    Event Date/Time   First MD Initiated Contact with Patient 12/19/23 1238     (approximate)   History   Leg Pain   HPI  Oscar Elliott is a 63 y.o. male with history of hyperlipidemia, schizophrenia, diabetes, CAD, CKD, presenting with bilateral leg aching.  Also noted mild headache that started today, he has no focal weakness or numbness, no chest pain or shortness of breath, no pain anywhere else.  No new trauma or falls.  No vision changes, no urinary symptoms, no back pain.  States that leg aching has been going on for a long time.  He is asking for something to eat here.  Independent history from EMS, [facility], he reported to them that his leg was hurting.  No reported trauma or falls.  Told him that the pain has been going on for the last 2 months.     Physical Exam   Triage Vital Signs: ED Triage Vitals  Encounter Vitals Group     BP 12/19/23 1149 (!) 149/89     Girls Systolic BP Percentile --      Girls Diastolic BP Percentile --      Boys Systolic BP Percentile --      Boys Diastolic BP Percentile --      Pulse Rate 12/19/23 1149 81     Resp 12/19/23 1149 16     Temp 12/19/23 1149 97.8 F (36.6 C)     Temp Source 12/19/23 1149 Oral     SpO2 12/19/23 1149 100 %     Weight 12/19/23 1149 120 lb (54.4 kg)     Height 12/19/23 1149 5' 1 (1.549 m)     Head Circumference --      Peak Flow --      Pain Score 12/19/23 1157 10     Pain Loc --      Pain Education --      Exclude from Growth Chart --     Most recent vital signs: Vitals:   12/19/23 1149  BP: (!) 149/89  Pulse: 81  Resp: 16  Temp: 97.8 F (36.6 C)  SpO2: 100%     General: Awake, no distress.  CV:  Good peripheral perfusion.  Resp:  Normal effort.  Abd:  No distention.  Other:  He has a left AKA, is not swollen, no edema, nontender, no erythema or open wounds, fully able to range his bilateral lower  extremities, no saddle anesthesia or no spinal tenderness.  Right lower extremity without edema or unilateral swelling, no erythema or open wounds, nontender on palpation, no focal weakness or numbness, able to palpate his right DP pulse.  Pupils are equal and reactive, extraocular movements are intact, no cranial nerve deficits.  Grip strength is intact without focal weakness or numbness.   ED Results / Procedures / Treatments   Labs (all labs ordered are listed, but only abnormal results are displayed) Labs Reviewed - No data to display    PROCEDURES:  Critical Care performed: No  Procedures   MEDICATIONS ORDERED IN ED: Medications  acetaminophen  (TYLENOL ) tablet 1,000 mg (has no administration in time range)     IMPRESSION / MDM / ASSESSMENT AND PLAN / ED COURSE  I reviewed the triage vital signs and the nursing notes.  Differential diagnosis includes, but is not limited to, muscle ache, strain, no redness or swelling to suggest cellulitis or DVT, no focal weakness or numbness to suggest stroke, no back pain with radiation suggest sciatica.  Headache is mild, he has no focal deficits on exam.  He is asking for something to eat.  He has no urinary or other infectious symptoms.  Patient is very well-appearing, no indication for additional workup at this time.  Will give him a Tylenol  here and have him discharged back to facility.  Will discharge with strict return precautions.  Patient's presentation is most consistent with acute, uncomplicated illness.        FINAL CLINICAL IMPRESSION(S) / ED DIAGNOSES   Final diagnoses:  Muscle ache of extremity  Nonintractable headache, unspecified chronicity pattern, unspecified headache type  History of schizophrenia     Rx / DC Orders   ED Discharge Orders     None        Note:  This document was prepared using Dragon voice recognition software and may include unintentional dictation  errors.    Waymond Lorelle Cummins, MD 12/19/23 [phone]

## 2023-12-24 NOTE — Progress Notes (Unsigned)
 Patient: Oscar Elliott Date of Birth: [birth year]  Reason for Visit: Follow up History from: Patient, caregiver Primary Neurologist: [place]  ASSESSMENT AND PLAN 63 y.o. year old male with history of schizophrenia, CKD, seizure.  1.  Seizures  - Continue Topamax  25 mg twice a day - Can discuss with PCP about a new wheelchair - Topamax  level 2.01 December 2022 - BP was elevated, recheck at facility  - Call for seizure activity, follow-up in 1 year  Meds ordered this encounter  Medications   topiramate  (TOPAMAX ) 25 MG tablet    Sig: Take 1 tablet (25 mg total) by mouth 2 (two) times daily.    Dispense:  180 tablet    Refill:  3   HISTORY OF PRESENT ILLNESS: Today 12/25/23 12/25/23 SS: Here with caregiver, lives at [facility]. No seizures. Remains on Topamax  25 mg twice daily. Doing well. No issues. Needs new glasses hasn't gotten them yet. Complains of right leg pain. Would like a new wheelchair and for his family to come see him. Drools constantly.  Admitted in July for aortic aneurysm repair.  HISTORY  INTERVAL HISTORY 12/26/2022: Patient presents today for follow-up, he is accompanied by caregiver.  Last visit was in April, since then he has been doing well on topiramate  25 mg twice daily, denies any seizure or seizure activity.  His routine EEG was completed and it was normal. He also denies any side effect from the medication.  In term of the seizures, he is doing well, no complaint.     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  Patient is initially presented to the hospital and on February 28 for tremor and jerks. His initial EEG showed generalized discharges consistent with generalized epilepsy.  He did have a long-term EEG that captured a couple generalized seizures.  Initially he was put on  Depakote  but did have spontaneous subdural hemorrhage, low platelets, due to that Depakote  has been discontinued to topiramate , renal dosage, 12.5 mg twice daily.  He has been doing well since leaving the hospital, caregiver has not reported any seizures.  She reports witnessing a seizure 3 years ago, again same generalized shaking or jerks and per chart review there was a possible seizure 15 years ago.  Currently he is doing well, has no complaints.  He is back to his normal self. History mainly obtained by chart review.     Handedness: Right handed    Onset: Possibly 3 years ago but was seen on EEG February 28   Seizure Type: Generalized seizure   Current frequency: Last sz in February when she was hospitalized prior to that 3 years ago.  There is a possibility of also patient had a seizure 15 years ago   Any injuries from seizures: Denies   Seizure risk factors: None reported he has a history of schizophrenia   Previous ASMs: None   Currenty ASMs: Topiramate  25 mg   ASMs side effects: Denies   Brain Images: Generalized volume loss but repeat imaging showed subdural hematoma   Previous EEGs: Generalized spike consistent with generalized epilepsy  REVIEW OF SYSTEMS: Out of a complete 14 system review of symptoms, the patient complains only of the following symptoms, and all other reviewed systems are negative.  See HPI  ALLERGIES: Allergies  Allergen Reactions   Penicillins Other (See Comments)  DIZZINESS     HOME MEDICATIONS: Outpatient Medications Prior to Visit  Medication Sig Dispense Refill   acetaminophen  (TYLENOL ) 500 MG tablet Take 500 mg by mouth every 4 (four) hours as needed for moderate pain.     atorvastatin  (LIPITOR ) 80 MG tablet TAKE ONE TABLET BY MOUTH AT BEDTIME 90 tablet 0   atropine 1 % ophthalmic solution Place 1-2 drops under the tongue every 4 (four) hours as needed (secretions).     busPIRone (BUSPAR) 10 MG tablet Take 10 mg by mouth 2 (two) times  daily.     cefUROXime  (CEFTIN ) 250 MG tablet Take 1 tablet (250 mg total) by mouth 2 (two) times daily with a meal. 14 tablet 0   Cholecalciferol (VITAMIN D ) 50 MCG (2000 UT) CAPS Take 2,000 Units by mouth daily at 12 noon.     clopidogrel  (PLAVIX ) 75 MG tablet Take 75 mg by mouth daily.     cloZAPine  (CLOZARIL ) 100 MG tablet 100 mg in AM and 500 mg at night (Patient taking differently: Take 100-500 mg by mouth See admin instructions. Take 100 mg by mouth in morning and 500 mg at night) 180 tablet 0   cyanocobalamin  1000 MCG tablet Take 1 tablet (1,000 mcg total) by mouth daily.     docusate sodium  (COLACE) 100 MG capsule Take 100 mg by mouth 2 (two) times daily.     EASYMAX TEST test strip 1 each by Other route as needed.     feeding supplement (ENSURE ENLIVE / ENSURE PLUS) LIQD Take 237 mLs by mouth 2 (two) times daily with a meal. (Patient taking differently: Take 237 mLs by mouth 2 (two) times daily as needed.) 237 mL 12   finasteride (PROSCAR) 5 MG tablet Take 5 mg by mouth daily.     gabapentin  (NEURONTIN ) 300 MG capsule Take 300 mg by mouth 3 (three) times daily.     glycopyrrolate  (ROBINUL ) 1 MG tablet Take 1 mg by mouth 2 (two) times daily.     Insulin  Degludec (TRESIBA) 100 UNIT/ML SOLN Inject 33 Units into the skin at bedtime.     JANUVIA 25 MG tablet Take 25 mg by mouth daily.     ketoconazole (NIZORAL) 2 % shampoo Apply 1 application topically 2 (two) times a week.     latanoprost  (XALATAN ) 0.005 % ophthalmic solution Place 1 drop into both eyes at bedtime.     LINZESS  145 MCG CAPS capsule TAKE 1 CAPSULE BY MOUTH ONCE DAILY BEFORE BREAKFAST. 30 capsule 10   metoprolol  tartrate (LOPRESSOR ) 25 MG tablet TAKE ONE TABLET BY MOUTH TWICE DAILY 180 tablet 0   Multiple Vitamin (MULTIVITAMIN WITH MINERALS) TABS tablet Take 1 tablet by mouth daily. 30 tablet 2   nitroGLYCERIN  (NITROSTAT ) 0.4 MG SL tablet Place 0.4 mg under the tongue every 5 (five) minutes x 3 doses as needed (if no relief  after 3rd dose, procee to ED or call 911).     omeprazole (PRILOSEC) 40 MG capsule Take 40 mg by mouth 2 (two) times daily.      polyethylene glycol (MIRALAX  / GLYCOLAX ) 17 g packet Take 17 g by mouth daily as needed for moderate constipation. 14 each 10   primidone (MYSOLINE) 50 MG tablet Take 50 mg by mouth daily.     tamsulosin  (FLOMAX ) 0.4 MG CAPS capsule Take 0.4 mg by mouth daily.     thiamine  (VITAMIN B-1) 100 MG tablet Take 1 tablet (100 mg total) by mouth daily. 30 tablet 2   traZODone  (  DESYREL ) 50 MG tablet Take 50 mg by mouth at bedtime.     topiramate  (TOPAMAX ) 25 MG tablet Take 1 tablet (25 mg total) by mouth 2 (two) times daily. 180 tablet 3   No facility-administered medications prior to visit.    PAST MEDICAL HISTORY: Past Medical History:  Diagnosis Date   CAD (coronary artery disease)    a. 10/2013 Lateral STEMI/PCI: LM nl, LAD min irregs, D1 occluded sub branch, LCX min irregs mid, OM large/patent, RCA 122m (2.75x28 Promus DES).   Cholelithiasis    CKD (chronic kidney disease)    Essential hypertension    Hyperlipidemia    MI (myocardial infarction) (HCC)    Nephrolithiasis    Peripheral vascular disease    a. 06/2007: s/p L AKA @ [facility]   Schizophrenia (HCC)    Type 2 diabetes mellitus (HCC)     PAST SURGICAL HISTORY: Past Surgical History:  Procedure Laterality Date   Above-the-knee amputation Left    COLONOSCOPY WITH PROPOFOL  N/A 12/09/2018   fields: nonthrombosed hemorrhoids, decreased anal sphincter tone. poor prep, colonoscopy aborted   ENDOVASCULAR STENT GRAFT (AAA) N/A 09/05/2023   Procedure: ENDOVASCULAR REPAIR/STENT GRAFT;  Surgeon: Marea Selinda RAMAN, MD;  Location: [facility] INVASIVE CV LAB;  Service: Cardiovascular;  Laterality: N/A;  with coil embolization of left internal iliac artery   FLEXIBLE SIGMOIDOSCOPY  11/17/2019   Procedure: FLEXIBLE SIGMOIDOSCOPY;  Surgeon: Cindie Carlin POUR, DO;  [facility];  Service: Endoscopy;;   LEFT HEART  CATHETERIZATION WITH CORONARY ANGIOGRAM N/A 11/20/2013   Procedure: LEFT HEART CATHETERIZATION WITH CORONARY ANGIOGRAM;  Surgeon: Candyce RAMAN Reek, MD;  Location: [facility] CATH LAB;  Service: Cardiovascular;  Laterality: N/A;    FAMILY HISTORY: Family History  Problem Relation Age of Onset   Other Other        no premature CAD.   Seizures Neg Hx     SOCIAL HISTORY: Social History   Socioeconomic History   Marital status: Single    Spouse name: Not on file   Number of children: Not on file   Years of education: Not on file   Highest education level: Not on file  Occupational History   Not on file  Tobacco Use   Smoking status: Every Day    Current packs/day: 1.00    Average packs/day: 1 pack/day for 20.0 years (20.0 ttl pk-yrs)    Types: Cigars, Cigarettes   Smokeless tobacco: Never   Tobacco comments:    1-2 cig daily  Vaping Use   Vaping status: Never Used  Substance and Sexual Activity   Alcohol  use: No    Alcohol /week: 0.0 standard drinks of alcohol    Drug use: No   Sexual activity: Never  Other Topics Concern   Not on file  Social History Narrative   Single, no children      Currently living in a group home      Outpatient psychiatrist month Dr. Susann    Social Drivers of Health   Financial Resource Strain: Not on file  Food Insecurity: No Food Insecurity (09/04/2023)   Hunger Vital Sign    Worried About Running Out of Food in the Last Year: Never true    Ran Out of Food in the Last Year: Never true  Transportation Needs: No Transportation Needs (09/04/2023)   PRAPARE - Administrator, Civil Service (Medical): No    Lack of Transportation (Non-Medical): No  Physical Activity: Not on file  Stress: Not on file  Social  Connections: Unknown (09/04/2023)   Social Connection and Isolation Panel    Frequency of Communication with Friends and Family: Once a week    Frequency of Social Gatherings with Friends and Family: Once a week    Attends Religious  Services: Never    Database Administrator or Organizations: No    Attends Banker Meetings: Never    Marital Status: Not on file  Intimate Partner Violence: Not At Risk (09/04/2023)   Humiliation, Afraid, Rape, and Kick questionnaire    Fear of Current or Ex-Partner: No    Emotionally Abused: No    Physically Abused: No    Sexually Abused: No    PHYSICAL EXAM  Vitals:   12/25/23 0943  BP: (!) 157/102  Pulse: 81  SpO2: 96%   There is no height or weight on file to calculate BMI.  Generalized: Well developed, in no acute distress  Neurological examination  Mentation: Alert oriented to time, place, limited historian.  Follows all commands, speech is slurred, drools Cranial nerve II-XII: Pupils were equal round reactive to light. Extraocular movements were full, visual field were full on confrontational test. Facial sensation and strength were normal. Head turning and shoulder shrug  were normal and symmetric. Motor: Good strength overall, left AKA Sensory: Sensory testing is intact to soft touch on all 4 extremities. No evidence of extinction is noted.  Coordination: Cerebellar testing reveals good finger-nose-finger bilaterally Gait and station: Seated in wheelchair  DIAGNOSTIC DATA (LABS, IMAGING, TESTING) - I reviewed patient records, labs, notes, testing and imaging myself where available.  Lab Results  Component Value Date   WBC 7.3 09/23/2023   HGB 11.0 (L) 09/23/2023   HCT 34.2 (L) 09/23/2023   MCV 90.7 09/23/2023   PLT 190 09/23/2023      Component Value Date/Time   NA 145 09/23/2023 1547   NA 142 12/26/2022 1032   NA 139 07/08/2013 1428   K 3.9 09/23/2023 1547   K 3.9 07/08/2013 1428   CL 113 (H) 09/23/2023 1547   CL 108 (H) 07/08/2013 1428   CO2 21 (L) 09/23/2023 1547   CO2 24 07/08/2013 1428   GLUCOSE 108 (H) 09/23/2023 1547   GLUCOSE 63 (L) 07/08/2013 1428   BUN 19 09/23/2023 1547   BUN 17 12/26/2022 1032   BUN 19 (H) 07/08/2013 1428    CREATININE 1.75 (H) 09/23/2023 1547   CREATININE 1.46 (H) 07/08/2013 1428   CALCIUM  9.1 09/23/2023 1547   CALCIUM  9.2 07/08/2013 1428   PROT 6.8 09/23/2023 1547   PROT 7.0 12/26/2022 1032   PROT 7.7 07/08/2013 1428   ALBUMIN 3.3 (L) 09/23/2023 1547   ALBUMIN 4.3 12/26/2022 1032   ALBUMIN 3.7 07/08/2013 1428   AST 22 09/23/2023 1547   AST 29 07/08/2013 1428   ALT 14 09/23/2023 1547   ALT 19 07/08/2013 1428   ALKPHOS 97 09/23/2023 1547   ALKPHOS 95 07/08/2013 1428   BILITOT 0.3 09/23/2023 1547   BILITOT 0.3 12/26/2022 1032   BILITOT 0.4 07/08/2013 1428   GFRNONAA 43 (L) 09/23/2023 1547   GFRNONAA 54 (L) 07/08/2013 1428   GFRAA >60 11/17/2019 1137   GFRAA >60 07/08/2013 1428   Lab Results  Component Value Date   CHOL 68 05/01/2022   HDL 18 (L) 05/01/2022   LDLCALC 37 05/01/2022   TRIG 64 05/01/2022   CHOLHDL 3.8 05/01/2022   Lab Results  Component Value Date   HGBA1C 5.8 (H) 09/04/2023   Lab Results  Component Value Date   VITAMINB12 964 (H) 05/01/2022   Lab Results  Component Value Date   TSH 1.957 05/01/2022    Lauraine Born, AGNP-C, DNP 12/25/2023, 10:11 AM [facility] [address] [place], [place] [address] [phone]

## 2023-12-25 ENCOUNTER — Encounter: Payer: Self-pay | Admitting: Neurology

## 2023-12-25 ENCOUNTER — Ambulatory Visit (INDEPENDENT_AMBULATORY_CARE_PROVIDER_SITE_OTHER): Payer: Medicare (Managed Care) | Admitting: Neurology

## 2023-12-25 VITALS — BP 157/102 | HR 81

## 2023-12-25 DIAGNOSIS — F209 Schizophrenia, unspecified: Secondary | ICD-10-CM | POA: Diagnosis not present

## 2023-12-25 DIAGNOSIS — Z8669 Personal history of other diseases of the nervous system and sense organs: Secondary | ICD-10-CM | POA: Diagnosis not present

## 2023-12-25 MED ORDER — TOPIRAMATE 25 MG PO TABS: 25.0000 mg | ORAL_TABLET | Freq: Two times a day (BID) | ORAL | 3 refills | Status: AC

## 2023-12-25 NOTE — Patient Instructions (Signed)
 Great to see you today.  Continue Topamax .  Call for seizure activity, follow-up in 1 year.  Thanks!

## 2024-02-02 ENCOUNTER — Encounter: Payer: Self-pay | Admitting: Emergency Medicine

## 2024-02-02 ENCOUNTER — Emergency Department

## 2024-02-02 ENCOUNTER — Inpatient Hospital Stay
Admission: EM | Admit: 2024-02-02 | Discharge: 2024-02-04 | DRG: 689 | Disposition: A | Discharge status: Skilled Nursing Facility | Source: Skilled Nursing Facility | Attending: Internal Medicine | Admitting: Internal Medicine

## 2024-02-02 ENCOUNTER — Other Ambulatory Visit: Payer: Self-pay

## 2024-02-02 DIAGNOSIS — I10 Essential (primary) hypertension: Secondary | ICD-10-CM | POA: Diagnosis not present

## 2024-02-02 DIAGNOSIS — E785 Hyperlipidemia, unspecified: Secondary | ICD-10-CM | POA: Diagnosis present

## 2024-02-02 DIAGNOSIS — Z720 Tobacco use: Secondary | ICD-10-CM | POA: Diagnosis present

## 2024-02-02 DIAGNOSIS — F209 Schizophrenia, unspecified: Secondary | ICD-10-CM | POA: Diagnosis present

## 2024-02-02 DIAGNOSIS — N1832 Chronic kidney disease, stage 3b: Secondary | ICD-10-CM

## 2024-02-02 DIAGNOSIS — F17290 Nicotine dependence, other tobacco product, uncomplicated: Secondary | ICD-10-CM | POA: Diagnosis present

## 2024-02-02 DIAGNOSIS — I714 Abdominal aortic aneurysm, without rupture, unspecified: Secondary | ICD-10-CM | POA: Diagnosis present

## 2024-02-02 DIAGNOSIS — E162 Hypoglycemia, unspecified: Secondary | ICD-10-CM

## 2024-02-02 DIAGNOSIS — E1122 Type 2 diabetes mellitus with diabetic chronic kidney disease: Secondary | ICD-10-CM | POA: Diagnosis present

## 2024-02-02 DIAGNOSIS — N390 Urinary tract infection, site not specified: Secondary | ICD-10-CM | POA: Diagnosis present

## 2024-02-02 DIAGNOSIS — R109 Unspecified abdominal pain: Secondary | ICD-10-CM | POA: Diagnosis present

## 2024-02-02 DIAGNOSIS — E782 Mixed hyperlipidemia: Secondary | ICD-10-CM

## 2024-02-02 DIAGNOSIS — I129 Hypertensive chronic kidney disease with stage 1 through stage 4 chronic kidney disease, or unspecified chronic kidney disease: Secondary | ICD-10-CM | POA: Diagnosis present

## 2024-02-02 DIAGNOSIS — Z7985 Long-term (current) use of injectable non-insulin antidiabetic drugs: Secondary | ICD-10-CM | POA: Diagnosis not present

## 2024-02-02 DIAGNOSIS — I21A1 Myocardial infarction type 2: Secondary | ICD-10-CM | POA: Diagnosis present

## 2024-02-02 DIAGNOSIS — R7989 Other specified abnormal findings of blood chemistry: Secondary | ICD-10-CM | POA: Diagnosis not present

## 2024-02-02 DIAGNOSIS — B9689 Other specified bacterial agents as the cause of diseases classified elsewhere: Secondary | ICD-10-CM | POA: Diagnosis present

## 2024-02-02 DIAGNOSIS — R1085 Abdominal pain of multiple sites: Principal | ICD-10-CM

## 2024-02-02 DIAGNOSIS — Z89612 Acquired absence of left leg above knee: Secondary | ICD-10-CM | POA: Diagnosis not present

## 2024-02-02 DIAGNOSIS — K219 Gastro-esophageal reflux disease without esophagitis: Secondary | ICD-10-CM | POA: Diagnosis present

## 2024-02-02 DIAGNOSIS — Z79899 Other long term (current) drug therapy: Secondary | ICD-10-CM | POA: Diagnosis not present

## 2024-02-02 DIAGNOSIS — K5900 Constipation, unspecified: Secondary | ICD-10-CM | POA: Diagnosis present

## 2024-02-02 DIAGNOSIS — Z888 Allergy status to other drugs, medicaments and biological substances status: Secondary | ICD-10-CM | POA: Diagnosis not present

## 2024-02-02 DIAGNOSIS — I2510 Atherosclerotic heart disease of native coronary artery without angina pectoris: Secondary | ICD-10-CM | POA: Diagnosis present

## 2024-02-02 DIAGNOSIS — Z7984 Long term (current) use of oral hypoglycemic drugs: Secondary | ICD-10-CM | POA: Diagnosis not present

## 2024-02-02 DIAGNOSIS — I252 Old myocardial infarction: Secondary | ICD-10-CM | POA: Diagnosis not present

## 2024-02-02 DIAGNOSIS — Z794 Long term (current) use of insulin: Secondary | ICD-10-CM | POA: Diagnosis not present

## 2024-02-02 DIAGNOSIS — Z7902 Long term (current) use of antithrombotics/antiplatelets: Secondary | ICD-10-CM | POA: Diagnosis not present

## 2024-02-02 DIAGNOSIS — E11649 Type 2 diabetes mellitus with hypoglycemia without coma: Secondary | ICD-10-CM

## 2024-02-02 DIAGNOSIS — N3000 Acute cystitis without hematuria: Secondary | ICD-10-CM | POA: Diagnosis not present

## 2024-02-02 DIAGNOSIS — R197 Diarrhea, unspecified: Secondary | ICD-10-CM | POA: Diagnosis present

## 2024-02-02 DIAGNOSIS — R1084 Generalized abdominal pain: Secondary | ICD-10-CM

## 2024-02-02 DIAGNOSIS — E119 Type 2 diabetes mellitus without complications: Secondary | ICD-10-CM

## 2024-02-02 DIAGNOSIS — E1151 Type 2 diabetes mellitus with diabetic peripheral angiopathy without gangrene: Secondary | ICD-10-CM | POA: Diagnosis present

## 2024-02-02 LAB — URINALYSIS, W/ REFLEX TO CULTURE (INFECTION SUSPECTED)
Bilirubin Urine: NEGATIVE
Glucose, UA: NEGATIVE mg/dL
Hgb urine dipstick: NEGATIVE
Ketones, ur: NEGATIVE mg/dL
Nitrite: NEGATIVE
Protein, ur: NEGATIVE mg/dL
Specific Gravity, Urine: 1.012 (ref 1.005–1.030)
WBC, UA: >50 WBC/hpf (ref 0–5)
pH: 6 (ref 5.0–8.0)

## 2024-02-02 LAB — CBC WITH DIFFERENTIAL/PLATELET
Abs Immature Granulocytes: 0.03 K/uL (ref 0.00–0.07)
Basophils Absolute: 0.1 K/uL (ref 0.0–0.1)
Basophils Relative: 1 %
Eosinophils Absolute: 0 K/uL (ref 0.0–0.5)
Eosinophils Relative: 0 %
HCT: 40.6 % (ref 39.0–52.0)
Hemoglobin: 13 g/dL (ref 13.0–17.0)
Immature Granulocytes: 1 %
Lymphocytes Relative: 30 %
Lymphs Abs: 2 K/uL (ref 0.7–4.0)
MCH: 27.4 pg (ref 26.0–34.0)
MCHC: 32 g/dL (ref 30.0–36.0)
MCV: 85.5 fL (ref 80.0–100.0)
Monocytes Absolute: 0.3 K/uL (ref 0.1–1.0)
Monocytes Relative: 4 %
Neutro Abs: 4.3 K/uL (ref 1.7–7.7)
Neutrophils Relative %: 64 %
Platelets: 123 K/uL — ABNORMAL LOW (ref 150–400)
RBC: 4.75 MIL/uL (ref 4.22–5.81)
RDW: 15.9 % — ABNORMAL HIGH (ref 11.5–15.5)
WBC: 6.6 K/uL (ref 4.0–10.5)
nRBC: 0 % (ref 0.0–0.2)

## 2024-02-02 LAB — COMPREHENSIVE METABOLIC PANEL WITH GFR
ALT: 20 U/L (ref 0–44)
AST: 33 U/L (ref 15–41)
Albumin: 4 g/dL (ref 3.5–5.0)
Alkaline Phosphatase: 107 U/L (ref 38–126)
Anion gap: 12 (ref 5–15)
BUN: 15 mg/dL (ref 8–23)
CO2: 21 mmol/L — ABNORMAL LOW (ref 22–32)
Calcium: 9.2 mg/dL (ref 8.9–10.3)
Chloride: 111 mmol/L (ref 98–111)
Creatinine, Ser: 1.74 mg/dL — ABNORMAL HIGH (ref 0.61–1.24)
GFR, Estimated: 43 mL/min — ABNORMAL LOW (ref >60)
Glucose, Bld: 176 mg/dL — ABNORMAL HIGH (ref 70–99)
Potassium: 4 mmol/L (ref 3.5–5.1)
Sodium: 143 mmol/L (ref 135–145)
Total Bilirubin: 0.3 mg/dL (ref 0.0–1.2)
Total Protein: 7 g/dL (ref 6.5–8.1)

## 2024-02-02 LAB — CBC
HCT: 37.1 % — ABNORMAL LOW (ref 39.0–52.0)
Hemoglobin: 12.2 g/dL — ABNORMAL LOW (ref 13.0–17.0)
MCH: 28.2 pg (ref 26.0–34.0)
MCHC: 32.9 g/dL (ref 30.0–36.0)
MCV: 85.9 fL (ref 80.0–100.0)
Platelets: 117 K/uL — ABNORMAL LOW (ref 150–400)
RBC: 4.32 MIL/uL (ref 4.22–5.81)
RDW: 15.8 % — ABNORMAL HIGH (ref 11.5–15.5)
WBC: 5.7 K/uL (ref 4.0–10.5)
nRBC: 0 % (ref 0.0–0.2)

## 2024-02-02 LAB — CBG MONITORING, ED
Glucose-Capillary: 109 mg/dL — ABNORMAL HIGH (ref 70–99)
Glucose-Capillary: 145 mg/dL — ABNORMAL HIGH (ref 70–99)

## 2024-02-02 LAB — TROPONIN T, HIGH SENSITIVITY
Troponin T High Sensitivity: 112 ng/L (ref 0–19)
Troponin T High Sensitivity: 115 ng/L (ref 0–19)

## 2024-02-02 LAB — LIPASE, BLOOD: Lipase: 25 U/L (ref 11–51)

## 2024-02-02 LAB — C DIFFICILE QUICK SCREEN W PCR REFLEX
C Diff antigen: NEGATIVE
C Diff interpretation: NOT DETECTED
C Diff toxin: NEGATIVE

## 2024-02-02 MED ORDER — ONDANSETRON HCL 4 MG PO TABS: 4.0000 mg | ORAL_TABLET | Freq: Four times a day (QID) | ORAL | Status: DC | PRN

## 2024-02-02 MED ORDER — MORPHINE SULFATE (PF) 2 MG/ML IV SOLN: 2.0000 mg | Freq: Once | INTRAVENOUS | Status: DC

## 2024-02-02 MED ORDER — ONDANSETRON HCL 4 MG/2ML IJ SOLN
4.0000 mg | Freq: Four times a day (QID) | INTRAMUSCULAR | Status: DC | PRN
  Administered 2024-02-03 – 2024-02-04 (×2): 4 mg via INTRAVENOUS
  Filled 2024-02-02 (×2): qty 2

## 2024-02-02 MED ORDER — SODIUM CHLORIDE 0.9 % IV SOLN
1.0000 g | INTRAVENOUS | Status: AC
  Administered 2024-02-02: 1 g via INTRAVENOUS
  Filled 2024-02-02: qty 10

## 2024-02-02 MED ORDER — ACETAMINOPHEN 650 MG RE SUPP: 650.0000 mg | Freq: Four times a day (QID) | RECTAL | Status: DC | PRN

## 2024-02-02 MED ORDER — ACETAMINOPHEN 325 MG PO TABS
650.0000 mg | ORAL_TABLET | Freq: Four times a day (QID) | ORAL | Status: DC | PRN
  Administered 2024-02-02 – 2024-02-04 (×4): 650 mg via ORAL
  Filled 2024-02-02 (×4): qty 2

## 2024-02-02 MED ORDER — IOHEXOL 300 MG/ML  SOLN
80.0000 mL | Freq: Once | INTRAMUSCULAR | Status: AC | PRN
  Administered 2024-02-02: 80 mL via INTRAVENOUS

## 2024-02-02 MED ORDER — MORPHINE SULFATE (PF) 4 MG/ML IV SOLN
4.0000 mg | Freq: Once | INTRAVENOUS | Status: AC
  Administered 2024-02-02: 4 mg via INTRAVENOUS
  Filled 2024-02-02: qty 1

## 2024-02-02 MED ORDER — SODIUM CHLORIDE 0.9 % IV BOLUS
1000.0000 mL | Freq: Once | INTRAVENOUS | Status: AC
  Administered 2024-02-02: 1000 mL via INTRAVENOUS

## 2024-02-02 MED ORDER — FLEET ENEMA RE ENEM
1.0000 | ENEMA | Freq: Once | RECTAL | Status: AC
  Administered 2024-02-02: 1 via RECTAL

## 2024-02-02 MED ORDER — ENOXAPARIN SODIUM 40 MG/0.4ML IJ SOSY
40.0000 mg | PREFILLED_SYRINGE | INTRAMUSCULAR | Status: DC
  Administered 2024-02-02 – 2024-02-03 (×2): 40 mg via SUBCUTANEOUS
  Filled 2024-02-02 (×2): qty 0.4

## 2024-02-02 NOTE — ED Provider Notes (Signed)
 CT ABDOMEN PELVIS W CONTRAST Result Date: 02/02/2024 EXAM: CT ABDOMEN AND PELVIS WITH CONTRAST 02/02/2024 06:10:51 PM TECHNIQUE: CT of the abdomen and pelvis was performed with the administration of intravenous contrast. 80 mL of iohexol  (OMNIPAQUE ) 300 MG/ML solution was administered. Multiplanar reformatted images are provided for review. Automated exposure control, iterative reconstruction, and/or weight-based adjustment of the mA/kV was utilized to reduce the radiation dose to as low as reasonably achievable. COMPARISON: 09/04/2023 CLINICAL HISTORY: Left upper and left lower quadrant abdominal pain. FINDINGS: LOWER CHEST: Scarring at the left lung base. LIVER: The liver is unremarkable. GALLBLADDER AND BILE DUCTS: Small layering gallstones within the gallbladder. No biliary ductal dilatation. SPLEEN: No acute abnormality. PANCREAS: No acute abnormality. ADRENAL GLANDS: A low-density nodule in the right adrenal gland measures 2.5 cm, stable. Most compatible with adenoma. Diffuse enlargement of both adrenal glands suggests hyperplasia. KIDNEYS, URETERS AND BLADDER: Punctate bilateral nephrolithiasis. No hydronephrosis. No perinephric or periureteral stranding. Air within the urinary bladder, presumably from recent catheterization. GI AND BOWEL: Stomach demonstrates no acute abnormality. Large stool burden throughout the colon. Normal appendix. There is no bowel obstruction. PERITONEUM AND RETROPERITONEUM: No ascites. No free air. VASCULATURE: Prior stent graft repair of abdominal aortic aneurysm. Aneurysm sac size measures up to 6.3 cm, stable since prior study. LYMPH NODES: No lymphadenopathy. REPRODUCTIVE ORGANS: No acute abnormality. BONES AND SOFT TISSUES: No acute osseous abnormality. No focal soft tissue abnormality. IMPRESSION: 1. No acute findings. 2. Stable aneurysm sac size of 6.3 cm following prior stent graft repair of abdominal aortic aneurysm. 3. Bilateral nephrolithiasis. 4. Cholelithiasis. 5.  Large stool burden throughout the colon. Electronically signed by: Franky Crease MD 02/02/2024 06:31 PM EST RP Workstation: [id number]     CT imaging reviewed.  No severe acute finding, notable stool burden noted.  Findings do not seem compatible with acute cholecystitis based on location of pain in the lower abdomen.  Discussed with patient will be admitted to hospitalist on the care of Dr. Manfred.  Will treat for UTI start Rocephin .  Low risk penicillin allergy.  Patient pain improving with morphine .  Resting comfortably at this time currently drinking apple juice  Will admit for further observation due to elevated troponins. Noo chest pain   Dicky Anes, MD 02/02/24 2021

## 2024-02-02 NOTE — H&P (Incomplete)
 History and Physical    Patient: Oscar Elliott FMW:[id number] DOB: [birth year] DOA: 02/02/2024 DOS: the patient was seen and examined on 02/02/2024 PCP: Patient, No Pcp Per  Patient coming from: {Point_of_Origin:[address]}  Chief Complaint:  Chief Complaint  Patient presents with   Abdominal Pain   Hypoglycemia   HPI: Oscar Elliott is a 63 y.o. male with medical history significant of ***  Review of Systems: {ROS_Text:26778} Past Medical History:  Diagnosis Date   CAD (coronary artery disease)    a. 10/2013 Lateral STEMI/PCI: LM nl, LAD min irregs, D1 occluded sub branch, LCX min irregs mid, OM large/patent, RCA 140m (2.75x28 Promus DES).   Cholelithiasis    CKD (chronic kidney disease)    Essential hypertension    Hyperlipidemia    MI (myocardial infarction) (HCC)    Nephrolithiasis    Peripheral vascular disease    a. 06/2007: s/p L AKA @ [facility]   Schizophrenia (HCC)    Type 2 diabetes mellitus (HCC)    Past Surgical History:  Procedure Laterality Date   Above-the-knee amputation Left    COLONOSCOPY WITH PROPOFOL  N/A 12/09/2018   fields: nonthrombosed hemorrhoids, decreased anal sphincter tone. poor prep, colonoscopy aborted   ENDOVASCULAR STENT GRAFT (AAA) N/A 09/05/2023   Procedure: ENDOVASCULAR REPAIR/STENT GRAFT;  Surgeon: Marea Selinda RAMAN, MD;  Location: [facility] INVASIVE CV LAB;  Service: Cardiovascular;  Laterality: N/A;  with coil embolization of left internal iliac artery   FLEXIBLE SIGMOIDOSCOPY  11/17/2019   Procedure: FLEXIBLE SIGMOIDOSCOPY;  Surgeon: Cindie Carlin POUR, DO;  [facility];  Service: Endoscopy;;   LEFT HEART CATHETERIZATION WITH CORONARY ANGIOGRAM N/A 11/20/2013   Procedure: LEFT HEART CATHETERIZATION WITH CORONARY ANGIOGRAM;  Surgeon: Candyce RAMAN Reek, MD;  Location: [facility] CATH LAB;  Service: Cardiovascular;  Laterality: N/A;   Social History:  reports that he has been smoking cigars and cigarettes. He has a 20 pack-year smoking history. He has never  used smokeless tobacco. He reports that he does not drink alcohol  and does not use drugs.  Allergies  Allergen Reactions   Penicillins Other (See Comments)    DIZZINESS     Family History  Problem Relation Age of Onset   Other Other        no premature CAD.   Seizures Neg Hx     Prior to Admission medications   Medication Sig Start Date End Date Taking? Authorizing Provider  acetaminophen  (TYLENOL ) 500 MG tablet Take 500 mg by mouth every 4 (four) hours as needed for moderate pain.    [provider]  atorvastatin  (LIPITOR ) 80 MG tablet TAKE ONE TABLET BY MOUTH AT BEDTIME 12/18/23   Parthenia Olivia HERO, PA-C  atropine 1 % ophthalmic solution Place 1-2 drops under the tongue every 4 (four) hours as needed (secretions).    [provider]  busPIRone  (BUSPAR ) 10 MG tablet Take 10 mg by mouth 2 (two) times daily. 08/20/23   [provider]  cefUROXime  (CEFTIN ) 250 MG tablet Take 1 tablet (250 mg total) by mouth 2 (two) times daily with a meal. 09/24/23   Bradler, Evan K, MD  Cholecalciferol (VITAMIN D ) 50 MCG (2000 UT) CAPS Take 2,000 Units by mouth daily at 12 noon.    [provider]  clopidogrel  (PLAVIX ) 75 MG tablet Take 75 mg by mouth daily. 12/04/23   [provider]  cloZAPine  (CLOZARIL ) 100 MG tablet 100 mg in AM and 500 mg at night Patient taking differently: Take 100-500 mg by mouth See admin instructions.  Take 100 mg by mouth in morning and 500 mg at night 03/26/19   Vincente Murrain, MD  cyanocobalamin  1000 MCG tablet Take 1 tablet (1,000 mcg total) by mouth daily. 04/26/22   Von Bellis, MD  docusate sodium  (COLACE) 100 MG capsule Take 100 mg by mouth 2 (two) times daily.    [provider]  EASYMAX TEST test strip 1 each by Other route as needed. 07/06/20   [provider]  feeding supplement (ENSURE ENLIVE / ENSURE PLUS) LIQD Take 237 mLs by mouth 2 (two) times daily with a meal. Patient taking differently: Take 237 mLs by  mouth 2 (two) times daily as needed. 05/03/22   Lue Elsie BROCKS, MD  finasteride  (PROSCAR ) 5 MG tablet Take 5 mg by mouth daily. 08/20/23   [provider]  gabapentin  (NEURONTIN ) 300 MG capsule Take 300 mg by mouth 3 (three) times daily. 08/20/23   [provider]  glycopyrrolate  (ROBINUL ) 1 MG tablet Take 1 mg by mouth 2 (two) times daily. 08/20/23   [provider]  Insulin  Degludec (TRESIBA) 100 UNIT/ML SOLN Inject 33 Units into the skin at bedtime.    [provider]  JANUVIA 25 MG tablet Take 25 mg by mouth daily. 08/20/23   [provider]  ketoconazole (NIZORAL) 2 % shampoo Apply 1 application topically 2 (two) times a week.    [provider]  latanoprost  (XALATAN ) 0.005 % ophthalmic solution Place 1 drop into both eyes at bedtime.    [provider]  LINZESS  145 MCG CAPS capsule TAKE 1 CAPSULE BY MOUTH ONCE DAILY BEFORE BREAKFAST. 08/10/22   Shaaron Lamar HERO, MD  metoprolol  tartrate (LOPRESSOR ) 25 MG tablet TAKE ONE TABLET BY MOUTH TWICE DAILY 12/18/23   Parthenia Olivia HERO, PA-C  Multiple Vitamin (MULTIVITAMIN WITH MINERALS) TABS tablet Take 1 tablet by mouth daily. 05/04/22   Lue Elsie BROCKS, MD  nitroGLYCERIN  (NITROSTAT ) 0.4 MG SL tablet Place 0.4 mg under the tongue every 5 (five) minutes x 3 doses as needed (if no relief after 3rd dose, procee to ED or call 911). 06/28/22   [provider]  omeprazole (PRILOSEC) 40 MG capsule Take 40 mg by mouth 2 (two) times daily.     [provider]  polyethylene glycol (MIRALAX  / GLYCOLAX ) 17 g packet Take 17 g by mouth daily as needed for moderate constipation. 04/21/23   Margrette, Myah A, PA-C  primidone  (MYSOLINE ) 50 MG tablet Take 50 mg by mouth daily. 12/04/23   [provider]  tamsulosin  (FLOMAX ) 0.4 MG CAPS capsule Take 0.4 mg by mouth daily.    [provider]  thiamine  (VITAMIN B-1) 100 MG tablet Take 1 tablet (100 mg total) by mouth daily. 05/04/22    Lue Elsie BROCKS, MD  topiramate  (TOPAMAX ) 25 MG tablet Take 1 tablet (25 mg total) by mouth 2 (two) times daily. 12/25/23 12/19/24  Gayland Lauraine PARAS, NP  traZODone  (DESYREL ) 50 MG tablet Take 50 mg by mouth at bedtime.    [provider]    Physical Exam: Vitals:   02/02/24 1323 02/02/24 1515 02/02/24 1730 02/02/24 1817  BP:  (!) 116/96 (!) 155/93   Pulse:  90 88   Resp:  17 20   Temp: 97.7 F (36.5 C)   97.8 F (36.6 C)  TempSrc: Oral   Oral  SpO2:  98% 97%   Weight:      Height:       *** Data Reviewed: {Tip this will not  be part of the note when signed- Document your independent interpretation of telemetry tracing, EKG, lab, Radiology test or any other diagnostic tests. Add any new diagnostic test ordered today. (Optional):26781} {Results:26384}  Assessment and Plan: No notes have been filed under this hospital service. Service: Hospitalist     Advance Care Planning:   Code Status: Prior ***  Consults: ***  Family Communication: ***  Severity of Illness: {Observation/Inpatient:21159}  Author: Posey Maier, DO 02/02/2024 8:07 PM  For on call review [email or web address].

## 2024-02-02 NOTE — ED Triage Notes (Signed)
 Per EMS pt coming from [facility] c/o dizziness, diarrhea and  mid abdominal pain. Report fall today. CBG 27. Given oral glucose and CBG 136. Left AKA.

## 2024-02-02 NOTE — ED Provider Notes (Addendum)
 [facility] Provider Note    Event Date/Time   First MD Initiated Contact with Patient 02/02/24 1315     (approximate)   History   Abdominal Pain and Hypoglycemia   HPI  Oscar Elliott is a 63 y.o. male with history of schizophrenia, hypertension, CKD, diabetes, presenting with abdominal pain and diarrhea.  States that diarrhea is been ongoing for the last month.  States that is yellow, denies hematochezia or melena.  Also notes abdominal pain.  Dysuria.  States that he is intermittently lightheaded but has not fallen.  No pain anywhere else.  No back pain or chest pain, no shortness of breath.  Per independent history from EMS, his blood glucose was initially found to be 27, he was given oral glucose with repeat blood glucose 136.  On independent chart review, he is a insulin -dependent diabetic, also on Januvia.  He was seen by vascular surgery in July, has history of AAA s/p repair in July.  History of hypertension and diabetes.     Physical Exam   Triage Vital Signs: ED Triage Vitals  Encounter Vitals Group     BP 02/02/24 1316 (!) 163/92     Girls Systolic BP Percentile --      Girls Diastolic BP Percentile --      Boys Systolic BP Percentile --      Boys Diastolic BP Percentile --      Pulse Rate 02/02/24 1316 95     Resp 02/02/24 1316 18     Temp --      Temp src --      SpO2 02/02/24 1316 100 %     Weight 02/02/24 1317 180 lb (81.6 kg)     Height 02/02/24 1317 5' 1 (1.549 m)     Head Circumference --      Peak Flow --      Pain Score 02/02/24 1316 8     Pain Loc --      Pain Education --      Exclude from Growth Chart --     Most recent vital signs: Vitals:   02/02/24 1316 02/02/24 1323  BP: (!) 163/92   Pulse: 95   Resp: 18   Temp:  97.7 F (36.5 C)  SpO2: 100%      General: Awake, no distress.  CV:  Good peripheral perfusion.  Resp:  Normal effort.  Abd:  No distention.  Soft, mildly tender to the left upper and left  lower quadrant without guarding Other:  Mildly dry oral mucosa, no CVA or flank tenderness bilaterally   ED Results / Procedures / Treatments   Labs (all labs ordered are listed, but only abnormal results are displayed) Labs Reviewed  COMPREHENSIVE METABOLIC PANEL WITH GFR - Abnormal; Notable for the following components:      Result Value   CO2 21 (*)    Glucose, Bld 176 (*)    Creatinine, Ser 1.74 (*)    GFR, Estimated 43 (*)    All other components within normal limits  CBC WITH DIFFERENTIAL/PLATELET - Abnormal; Notable for the following components:   RDW 15.9 (*)    Platelets 123 (*)    All other components within normal limits  CBG MONITORING, ED - Abnormal; Notable for the following components:   Glucose-Capillary 109 (*)    All other components within normal limits  TROPONIN T, HIGH SENSITIVITY - Abnormal; Notable for the following components:   Troponin T High Sensitivity 112 (*)  All other components within normal limits  C DIFFICILE QUICK SCREEN W PCR REFLEX    GASTROINTESTINAL PANEL BY PCR, STOOL (REPLACES STOOL CULTURE)  LIPASE, BLOOD  URINALYSIS, W/ REFLEX TO CULTURE (INFECTION SUSPECTED)  TROPONIN T, HIGH SENSITIVITY     EKG  EKG shows, sinus rhythm, rate 90, normal QRS, normal QTc, no obvious ischemic ST elevation, T wave flattening 1, aVL, not significantly changed compared to prior     PROCEDURES:  Critical Care performed: No  Procedures   MEDICATIONS ORDERED IN ED: Medications  iohexol  (OMNIPAQUE ) 300 MG/ML solution 80 mL (has no administration in time range)  sodium chloride  0.9 % bolus 1,000 mL (1,000 mLs Intravenous New Bag/Given 02/02/24 1428)     IMPRESSION / MDM / ASSESSMENT AND PLAN / ED COURSE  I reviewed the triage vital signs and the nursing notes.                              Differential diagnosis includes, but is not limited to, UTI, colitis, diverticulitis, peptic ulcer disease, gastritis, failure to thrive , dehydration,  electrolyte derangements.  Will get labs, CT, UA, will give him some fluids and monitor his glucose here.  Patient's presentation is most consistent with acute presentation with potential threat to life or bodily function.  Independent interpretation of labs and imaging below.  Patient signed out pending CT abdomen pelvis and stool studies.    Clinical Course as of 02/02/24 1537  Sun Feb 02, 2024  1536 Independent review of labs, no leukocytosis, lipase is normal, electrolytes severely deranged, creatinine is mildly elevated but this appears to be stable compared to prior, LFTs are not elevated, troponin is elevated.  He has no chest pain at this time. [TT]    Clinical Course User Index [TT] Waymond Lorelle Cummins, MD     FINAL CLINICAL IMPRESSION(S) / ED DIAGNOSES   Final diagnoses:  Abdominal pain of multiple sites  Diarrhea, unspecified type  Hypoglycemia     Rx / DC Orders   ED Discharge Orders     None        Note:  This document was prepared using Dragon voice recognition software and may include unintentional dictation errors.    Waymond Lorelle Cummins, MD 02/02/24 1537    Waymond Lorelle Cummins, MD 02/02/24 1537

## 2024-02-02 NOTE — H&P (Incomplete)
 History and Physical    Patient: Oscar Elliott DOB: 28-Mar-1960 DOA: 02/02/2024 DOS: the patient was seen and examined on 02/02/2024 PCP: Patient, No Pcp Per  Patient coming from: Home  Chief Complaint:  Chief Complaint  Patient presents with  . Abdominal Pain  . Hypoglycemia   HPI: Oscar Elliott is a 63 y.o. male with medical history significant of hypertension, hyperlipidemia, T2DM, CKD stage IIIA who presents to the emergency department due to     Review of Systems: {ROS_Text:26778} Past Medical History:  Diagnosis Date  . CAD (coronary artery disease)    a. 10/2013 Lateral STEMI/PCI: LM nl, LAD min irregs, D1 occluded sub branch, LCX min irregs mid, OM large/patent, RCA 138m (2.75x28 Promus DES).  . Cholelithiasis   . CKD (chronic kidney disease)   . Essential hypertension   . Hyperlipidemia   . MI (myocardial infarction) (HCC)   . Nephrolithiasis   . Peripheral vascular disease    a. 06/2007: s/p L AKA @ [facility]  . Schizophrenia (HCC)   . Type 2 diabetes mellitus (HCC)    Past Surgical History:  Procedure Laterality Date  . Above-the-knee amputation Left   . COLONOSCOPY WITH PROPOFOL  N/A 12/09/2018   fields: nonthrombosed hemorrhoids, decreased anal sphincter tone. poor prep, colonoscopy aborted  . ENDOVASCULAR STENT GRAFT (AAA) N/A 09/05/2023   Procedure: ENDOVASCULAR REPAIR/STENT GRAFT;  Surgeon: Marea Selinda RAMAN, MD;  Location: [facility] INVASIVE CV LAB;  Service: Cardiovascular;  Laterality: N/A;  with coil embolization of left internal iliac artery  . FLEXIBLE SIGMOIDOSCOPY  11/17/2019   Procedure: FLEXIBLE SIGMOIDOSCOPY;  Surgeon: Cindie Carlin POUR, DO;  Location: [facility];  Service: Endoscopy;;  . LEFT HEART CATHETERIZATION WITH CORONARY ANGIOGRAM N/A 11/20/2013   Procedure: LEFT HEART CATHETERIZATION WITH CORONARY ANGIOGRAM;  Surgeon: Candyce RAMAN Reek, MD;  Location: [facility] CATH LAB;  Service: Cardiovascular;  Laterality: N/A;   Social History:  reports  that he has been smoking cigars and cigarettes. He has a 20 pack-year smoking history. He has never used smokeless tobacco. He reports that he does not drink alcohol  and does not use drugs.  Allergies  Allergen Reactions  . Penicillins Other (See Comments)    DIZZINESS     Family History  Problem Relation Age of Onset  . Other Other        no premature CAD.  [facility] Seizures Neg Hx     Prior to Admission medications   Medication Sig Start Date End Date Taking? Authorizing Provider  acetaminophen  (TYLENOL ) 500 MG tablet Take 500 mg by mouth every 4 (four) hours as needed for moderate pain.    [provider]  atorvastatin  (LIPITOR ) 80 MG tablet TAKE ONE TABLET BY MOUTH AT BEDTIME 12/18/23   Parthenia Olivia HERO, PA-C  atropine 1 % ophthalmic solution Place 1-2 drops under the tongue every 4 (four) hours as needed (secretions).    [provider]  busPIRone  (BUSPAR ) 10 MG tablet Take 10 mg by mouth 2 (two) times daily. 08/20/23   [provider]  cefUROXime  (CEFTIN ) 250 MG tablet Take 1 tablet (250 mg total) by mouth 2 (two) times daily with a meal. 09/24/23   Bradler, Evan K, MD  Cholecalciferol (VITAMIN D ) 50 MCG (2000 UT) CAPS Take 2,000 Units by mouth daily at 12 noon.    [provider]  clopidogrel  (PLAVIX ) 75 MG tablet Take 75 mg by mouth daily. 12/04/23   [provider]  cloZAPine  (CLOZARIL ) 100 MG tablet 100 mg in AM  and 500 mg at night Patient taking differently: Take 100-500 mg by mouth See admin instructions. Take 100 mg by mouth in morning and 500 mg at night 03/26/19   Vincente Murrain, MD  cyanocobalamin  1000 MCG tablet Take 1 tablet (1,000 mcg total) by mouth daily. 04/26/22   Von Bellis, MD  docusate sodium  (COLACE) 100 MG capsule Take 100 mg by mouth 2 (two) times daily.    [provider]  EASYMAX TEST test strip 1 each by Other route as needed. 07/06/20   [provider]  feeding supplement (ENSURE ENLIVE / ENSURE PLUS)  LIQD Take 237 mLs by mouth 2 (two) times daily with a meal. Patient taking differently: Take 237 mLs by mouth 2 (two) times daily as needed. 05/03/22   Lue Elsie BROCKS, MD  finasteride  (PROSCAR ) 5 MG tablet Take 5 mg by mouth daily. 08/20/23   [provider]  gabapentin  (NEURONTIN ) 300 MG capsule Take 300 mg by mouth 3 (three) times daily. 08/20/23   [provider]  glycopyrrolate  (ROBINUL ) 1 MG tablet Take 1 mg by mouth 2 (two) times daily. 08/20/23   [provider]  Insulin  Degludec (TRESIBA) 100 UNIT/ML SOLN Inject 33 Units into the skin at bedtime.    [provider]  JANUVIA 25 MG tablet Take 25 mg by mouth daily. 08/20/23   [provider]  ketoconazole (NIZORAL) 2 % shampoo Apply 1 application topically 2 (two) times a week.    [provider]  latanoprost  (XALATAN ) 0.005 % ophthalmic solution Place 1 drop into both eyes at bedtime.    [provider]  LINZESS  145 MCG CAPS capsule TAKE 1 CAPSULE BY MOUTH ONCE DAILY BEFORE BREAKFAST. 08/10/22   Rourk, Lamar HERO, MD  metoprolol  tartrate (LOPRESSOR ) 25 MG tablet TAKE ONE TABLET BY MOUTH TWICE DAILY 12/18/23   Parthenia Olivia HERO, PA-C  Multiple Vitamin (MULTIVITAMIN WITH MINERALS) TABS tablet Take 1 tablet by mouth daily. 05/04/22   Lue Elsie BROCKS, MD  nitroGLYCERIN  (NITROSTAT ) 0.4 MG SL tablet Place 0.4 mg under the tongue every 5 (five) minutes x 3 doses as needed (if no relief after 3rd dose, procee to ED or call 911). 06/28/22   [provider]  omeprazole (PRILOSEC) 40 MG capsule Take 40 mg by mouth 2 (two) times daily.     [provider]  polyethylene glycol (MIRALAX  / GLYCOLAX ) 17 g packet Take 17 g by mouth daily as needed for moderate constipation. 04/21/23   Margrette, Myah A, PA-C  primidone  (MYSOLINE ) 50 MG tablet Take 50 mg by mouth daily. 12/04/23   [provider]  tamsulosin  (FLOMAX ) 0.4 MG CAPS capsule Take 0.4 mg by mouth daily.    [provider]  thiamine  (VITAMIN B-1) 100 MG tablet Take 1 tablet (100 mg total) by mouth daily. 05/04/22   Lue Elsie BROCKS, MD  topiramate  (TOPAMAX ) 25 MG tablet Take 1 tablet (25 mg total) by mouth 2 (two) times daily. 12/25/23 12/19/24  Gayland Lauraine PARAS, NP  traZODone  (DESYREL ) 50 MG tablet Take 50 mg by mouth at bedtime.    [provider]    Physical Exam: Vitals:   02/02/24 1323 02/02/24 1515 02/02/24 1730 02/02/24 1817  BP:  (!) 116/96 (!) 155/93   Pulse:  90 88   Resp:  17 20   Temp: 97.7 F (36.5 C)   97.8 F (36.6 C)  TempSrc: Oral   Oral  SpO2:  98% 97%   Weight:  Height:       *** Data Reviewed: {Tip this will not be part of the note when signed- Document your independent interpretation of telemetry tracing, EKG, lab, Radiology test or any other diagnostic tests. Add any new diagnostic test ordered today. (Optional):26781} {Results:26384}  Assessment and Plan: No notes have been filed under this hospital service. Service: Hospitalist     Advance Care Planning:   Code Status: Prior ***  Consults: ***  Family Communication: ***  Severity of Illness: {Observation/Inpatient:21159}  Author: Posey Maier, DO 02/02/2024 8:07 PM  For on call review [email or web address].

## 2024-02-03 LAB — GASTROINTESTINAL PANEL BY PCR, STOOL (REPLACES STOOL CULTURE)

## 2024-02-03 LAB — CREATININE, SERUM
Creatinine, Ser: 1.54 mg/dL — ABNORMAL HIGH (ref 0.61–1.24)
GFR, Estimated: 50 mL/min — ABNORMAL LOW (ref >60)

## 2024-02-03 LAB — CBC
HCT: 35.3 % — ABNORMAL LOW (ref 39.0–52.0)
Hemoglobin: 11.6 g/dL — ABNORMAL LOW (ref 13.0–17.0)
MCH: 28 pg (ref 26.0–34.0)
MCHC: 32.9 g/dL (ref 30.0–36.0)
MCV: 85.3 fL (ref 80.0–100.0)
Platelets: 134 K/uL — ABNORMAL LOW (ref 150–400)
RBC: 4.14 MIL/uL — ABNORMAL LOW (ref 4.22–5.81)
RDW: 16 % — ABNORMAL HIGH (ref 11.5–15.5)
WBC: 5.4 K/uL (ref 4.0–10.5)
nRBC: 0 % (ref 0.0–0.2)

## 2024-02-03 LAB — TROPONIN T, HIGH SENSITIVITY
Troponin T High Sensitivity: 110 ng/L (ref 0–19)
Troponin T High Sensitivity: 112 ng/L (ref 0–19)

## 2024-02-03 LAB — COMPREHENSIVE METABOLIC PANEL WITH GFR
ALT: 17 U/L (ref 0–44)
AST: 23 U/L (ref 15–41)
Albumin: 3.7 g/dL (ref 3.5–5.0)
Alkaline Phosphatase: 99 U/L (ref 38–126)
Anion gap: 10 (ref 5–15)
BUN: 13 mg/dL (ref 8–23)
CO2: 21 mmol/L — ABNORMAL LOW (ref 22–32)
Calcium: 8.8 mg/dL — ABNORMAL LOW (ref 8.9–10.3)
Chloride: 111 mmol/L (ref 98–111)
Creatinine, Ser: 1.51 mg/dL — ABNORMAL HIGH (ref 0.61–1.24)
GFR, Estimated: 51 mL/min — ABNORMAL LOW (ref >60)
Glucose, Bld: 106 mg/dL — ABNORMAL HIGH (ref 70–99)
Potassium: 3.4 mmol/L — ABNORMAL LOW (ref 3.5–5.1)
Sodium: 142 mmol/L (ref 135–145)
Total Bilirubin: 0.2 mg/dL (ref 0.0–1.2)
Total Protein: 6.4 g/dL — ABNORMAL LOW (ref 6.5–8.1)

## 2024-02-03 LAB — CBG MONITORING, ED
Glucose-Capillary: 148 mg/dL — ABNORMAL HIGH (ref 70–99)
Glucose-Capillary: 177 mg/dL — ABNORMAL HIGH (ref 70–99)
Glucose-Capillary: 155 mg/dL — ABNORMAL HIGH (ref 70–99)
Glucose-Capillary: 137 mg/dL — ABNORMAL HIGH (ref 70–99)
Glucose-Capillary: 184 mg/dL — ABNORMAL HIGH (ref 70–99)

## 2024-02-03 LAB — MAGNESIUM: Magnesium: 1.9 mg/dL (ref 1.7–2.4)

## 2024-02-03 LAB — HEMOGLOBIN A1C
Hgb A1c MFr Bld: 7.5 % — ABNORMAL HIGH (ref 4.8–5.6)
Mean Plasma Glucose: 169 mg/dL

## 2024-02-03 LAB — GLUCOSE, CAPILLARY
Glucose-Capillary: 173 mg/dL — ABNORMAL HIGH (ref 70–99)
Glucose-Capillary: 146 mg/dL — ABNORMAL HIGH (ref 70–99)

## 2024-02-03 LAB — PHOSPHORUS: Phosphorus: 2.3 mg/dL — ABNORMAL LOW (ref 2.5–4.6)

## 2024-02-03 MED ORDER — POLYETHYLENE GLYCOL 3350 17 G PO PACK
17.0000 g | PACK | Freq: Every day | ORAL | Status: DC
  Administered 2024-02-03 – 2024-02-04 (×2): 17 g via ORAL
  Filled 2024-02-03 (×2): qty 1

## 2024-02-03 MED ORDER — PANTOPRAZOLE SODIUM 40 MG PO TBEC
80.0000 mg | DELAYED_RELEASE_TABLET | Freq: Every day | ORAL | Status: DC
  Administered 2024-02-03 – 2024-02-04 (×2): 80 mg via ORAL
  Filled 2024-02-03 (×3): qty 2

## 2024-02-03 MED ORDER — METOPROLOL TARTRATE 25 MG PO TABS
25.0000 mg | ORAL_TABLET | Freq: Every day | ORAL | Status: DC
  Administered 2024-02-03 – 2024-02-04 (×2): 25 mg via ORAL
  Filled 2024-02-03 (×2): qty 1

## 2024-02-03 MED ORDER — INSULIN ASPART 100 UNIT/ML IJ SOLN: 0.0000 [IU] | Freq: Every day | INTRAMUSCULAR | Status: DC

## 2024-02-03 MED ORDER — INSULIN ASPART 100 UNIT/ML IJ SOLN
0.0000 [IU] | Freq: Three times a day (TID) | INTRAMUSCULAR | Status: DC
  Administered 2024-02-03: 1 [IU] via SUBCUTANEOUS
  Administered 2024-02-03: 2 [IU] via SUBCUTANEOUS
  Filled 2024-02-03: qty 2
  Filled 2024-02-03: qty 1

## 2024-02-03 MED ORDER — SODIUM CHLORIDE 0.9 % IV SOLN
1.0000 g | INTRAVENOUS | Status: DC
  Administered 2024-02-03: 1 g via INTRAVENOUS
  Filled 2024-02-03 (×2): qty 10

## 2024-02-03 MED ORDER — ATORVASTATIN CALCIUM 20 MG PO TABS
80.0000 mg | ORAL_TABLET | Freq: Every day | ORAL | Status: DC
  Administered 2024-02-03: 80 mg via ORAL
  Filled 2024-02-03: qty 4

## 2024-02-03 MED ORDER — METOPROLOL TARTRATE 25 MG PO TABS: 25.0000 mg | ORAL_TABLET | Freq: Two times a day (BID) | ORAL | Status: DC

## 2024-02-03 NOTE — Progress Notes (Signed)
   Brief Progress Note   _____________________________________________________________________________________________________________  Patient Name: Oscar Elliott Patient DOB: January 21, 1961 Date: @TODAY @      Data: Reviewed labs, vital signs, and notes.    Action: No action required at this time.    Response:    _____________________________________________________________________________________________________________  [facility] RN Expeditor Tangie Stay S Foster Sonnier Please contact us  directly via secure chat (search for [place]) or by calling us  at [phone] [facility]).

## 2024-02-03 NOTE — Progress Notes (Signed)
 PROGRESS NOTE    Oscar Elliott  FMW:[id number] DOB: [birth year] DOA: 02/02/2024 PCP: Patient, No Pcp Per   Brief Narrative:   Oscar Elliott is a 63 y.o. male with medical history significant of hypertension, hyperlipidemia, T2DM, CKD stage IIIA who presents to the emergency department due to abdominal pain and 1 month onset of diarrhea with no blood.  He complained of intermittent lightheadedness and denies chest pain, shortness of breath, falls, back pain.  EMS was activated and on arrival of EMS team, blood glucose was 27, oral glucose was given with improvement in blood glucose to 136.  Patient recently had AAA s/p repair in July.   ED course In the emergency department, BP was 163/92, other vital signs were within normal range.  Workup in the ED showed normal CBC except platelets of 123, BMP was normal except for creatinine of 1.74 and blood glucose of 176.  Troponin 112 > 115. CT abdomen and pelvis showed large stool burden throughout the colon He was treated with IV ceftriaxone  due to presumed UTI.  IV hydration was provided. Fleet enema was given with successful bowel movement with formed stool and no diarrhea   Assessment & Plan:   Principal Problem:   UTI (urinary tract infection) Active Problems:   Tobacco abuse   Schizophrenia (HCC)   Hyperlipidemia   Abdominal pain   Elevated troponin   Insulin  dependent type 2 diabetes mellitus (HCC)   Constipation   AAA (abdominal aortic aneurysm)   Abdominal pain likely exacerbated by severe constipation  as seen in CT scan This appears to be acute on chronic abdominal pain Continue IV hydration Continue IV morphine  2 mg q.4h p.r.n. for moderate to severe pain, minimize use Continue IV Zofran  p.r.n. Continue scheduled MiraLAX    presumed UTI POA Patient was started on IV ceftriaxone , continue IV ceftriaxone  Urine culture pending   Type 2 diabetes mellitus with hypoglycemia-resolved Glucose 27 per EMS.  Subsequent readings  in the hospital have been in 120s-150s This may be due to poor oral intake in the setting of abdominal pain Continue CBG.  Hemoglobin A1c in July 2025 was 5.8, Repeat A1c in the morning Will avoid insulin .   Elevated troponin possibly secondary to type II demand ischemia Troponin 112 > 115, patient denies chest pain EKG personally reviewed showed normal sinus rhythm at rate of 90 bpm and showed no significant change from ECG done in July 2025   Essential hypertension Continue Lopressor    CKD 3B Stable. Renally adjust medications, avoid nephrotoxic agents/dehydration/hypotension   Mixed hyperlipidemia Continue Lipitor    GERD Continue Protonix       Advance Care Planning: Full code   Consults: None   Family Communication: None at bedside   Severity of Illness: The appropriate patient status for this patient is INPATIENT. Inpatient status is judged to be reasonable and necessary in order to provide the required intensity of service to ensure the patient's safety. The patient's presenting symptoms, physical exam findings, and initial radiographic and laboratory data in the context of their chronic comorbidities is felt to place them at high risk for further clinical deterioration. Furthermore, it is not anticipated that the patient will be medically stable for discharge from the hospital within 2 midnights of admission.    * I certify that at the point of admission it is my clinical judgment that the patient will require inpatient hospital care spanning beyond 2 midnights from the point of admission due to high intensity of service, high risk for further deterioration  and high frequency of surveillance required.*  Subjective:  Patient seen and examined at the bedside.  He is alert and oriented x 2.  Answers appropriately.  Vital signs are stable.  Reports of ongoing abdominal distention but pain has improved.  Objective: Vitals:   02/03/24 1250 02/03/24 1400 02/03/24 1500 02/03/24  1542  BP:  139/87 123/81 123/81  Pulse:  84 84 84  Resp:    18  Temp: 98 F (36.7 C)   98.1 F (36.7 C)  TempSrc: Oral   Oral  SpO2:  96% 96% 96%  Weight:      Height:        Intake/Output Summary (Last 24 hours) at 02/03/2024 1549 Last data filed at 02/03/2024 1012 Gross per 24 hour  Intake --  Output 825 ml  Net -825 ml   Filed Weights   02/02/24 1317  Weight: 81.6 kg    Examination:  General: Awake and alert and oriented x2. Not in any acute distress.  HEENT: NCAT.  PERRLA. EOMI. Sclerae anicteric.  Moist mucosal membranes. Neck: Neck supple without lymphadenopathy. No carotid bruits. No masses palpated.  Cardiovascular: Regular rate with normal S1-S2 sounds. No murmurs, rubs or gallops auscultated. No JVD.  Respiratory: Clear breath sounds.  No accessory muscle use. Abdomen: Soft, mild tenderness to palpation without guarding.,mildly distended. Active bowel sounds. No masses or hepatosplenomegaly  Skin: No rashes, lesions, or ulcerations.  Dry, warm to touch. Musculoskeletal: Left AKA.  2+ dorsalis pedis and radial pulses. Good ROM.  No contractures  Psychiatric: Intact judgment and insight.  Mood appropriate to current condition. Neurologic: No focal neurological deficits. Strength is 5/5 x 4.  CN II - XII grossly intact.   Data Reviewed: I have personally reviewed following labs and imaging studies  CBC: Recent Labs  Lab 02/02/24 1408 02/02/24 2246 02/03/24 0538  WBC 6.6 5.7 5.4  NEUTROABS 4.3  --   --   HGB 13.0 12.2* 11.6*  HCT 40.6 37.1* 35.3*  MCV 85.5 85.9 85.3  PLT 123* 117* 134*   Basic Metabolic Panel: Recent Labs  Lab 02/02/24 1408 02/02/24 2246 02/03/24 0226  NA 143  --  142  K 4.0  --  3.4*  CL 111  --  111  CO2 21*  --  21*  GLUCOSE 176*  --  106*  BUN 15  --  13  CREATININE 1.74* 1.54* 1.51*  CALCIUM  9.2  --  8.8*  MG  --   --  1.9  PHOS  --   --  2.3*   GFR: Estimated Creatinine Clearance: 44.7 mL/min (A) (by C-G formula based  on SCr of 1.51 mg/dL (H)). Liver Function Tests: Recent Labs  Lab 02/02/24 1408 02/03/24 0226  AST 33 23  ALT 20 17  ALKPHOS 107 99  BILITOT 0.3 0.2  PROT 7.0 6.4*  ALBUMIN 4.0 3.7   Recent Labs  Lab 02/02/24 1408  LIPASE 25   No results for input(s): AMMONIA in the last 168 hours. Coagulation Profile: No results for input(s): INR, PROTIME in the last 168 hours. Cardiac Enzymes: No results for input(s): CKTOTAL, CKMB, CKMBINDEX, TROPONINI in the last 168 hours. BNP (last 3 results) No results for input(s): PROBNP in the last 8760 hours. HbA1C: No results for input(s): HGBA1C in the last 72 hours. CBG: Recent Labs  Lab 02/02/24 2111 02/03/24 0125 02/03/24 0617 02/03/24 0730 02/03/24 1132  GLUCAP 145* 148* 177* 155* 137*   Lipid Profile: No results for input(s): CHOL,  HDL, LDLCALC, TRIG, CHOLHDL, LDLDIRECT in the last 72 hours. Thyroid Function Tests: No results for input(s): TSH, T4TOTAL, FREET4, T3FREE, THYROIDAB in the last 72 hours. Anemia Panel: No results for input(s): VITAMINB12, FOLATE, FERRITIN, TIBC, IRON, RETICCTPCT in the last 72 hours. Sepsis Labs: No results for input(s): PROCALCITON, LATICACIDVEN in the last 168 hours.  Recent Results (from the past 240 hours)  C Difficile Quick Screen w PCR reflex     Status: None   Collection Time: 02/02/24 10:46 PM   Specimen: STOOL  Result Value Ref Range Status   C Diff antigen NEGATIVE NEGATIVE Final   C Diff toxin NEGATIVE NEGATIVE Final   C Diff interpretation No C. difficile detected.  Final    Comment: Performed at [facility], [address] [place], [place] [address]  Gastrointestinal Panel by PCR , Stool     Status: None   Collection Time: 02/02/24 10:46 PM   Specimen: Stool  Result Value Ref Range Status   Campylobacter species NOT DETECTED NOT DETECTED Final   Plesimonas shigelloides NOT DETECTED NOT DETECTED Final   Salmonella  species NOT DETECTED NOT DETECTED Final   Yersinia enterocolitica NOT DETECTED NOT DETECTED Final   Vibrio species NOT DETECTED NOT DETECTED Final   Vibrio cholerae NOT DETECTED NOT DETECTED Final   Enteroaggregative E coli (EAEC) NOT DETECTED NOT DETECTED Final   Enteropathogenic E coli (EPEC) NOT DETECTED NOT DETECTED Final   Enterotoxigenic E coli (ETEC) NOT DETECTED NOT DETECTED Final   Shiga like toxin producing E coli (STEC) NOT DETECTED NOT DETECTED Final   Shigella/Enteroinvasive E coli (EIEC) NOT DETECTED NOT DETECTED Final   Cryptosporidium NOT DETECTED NOT DETECTED Final   Cyclospora cayetanensis NOT DETECTED NOT DETECTED Final   Entamoeba histolytica NOT DETECTED NOT DETECTED Final   Giardia lamblia NOT DETECTED NOT DETECTED Final   Adenovirus F40/41 NOT DETECTED NOT DETECTED Final   Astrovirus NOT DETECTED NOT DETECTED Final   Norovirus GI/GII NOT DETECTED NOT DETECTED Final   Rotavirus A NOT DETECTED NOT DETECTED Final   Sapovirus (I, II, IV, and V) NOT DETECTED NOT DETECTED Final    Comment: Performed at [facility], [address], [place], [place] [address]         Radiology Studies: CT ABDOMEN PELVIS W CONTRAST Result Date: 02/02/2024 EXAM: CT ABDOMEN AND PELVIS WITH CONTRAST 02/02/2024 06:10:51 PM TECHNIQUE: CT of the abdomen and pelvis was performed with the administration of intravenous contrast. 80 mL of iohexol  (OMNIPAQUE ) 300 MG/ML solution was administered. Multiplanar reformatted images are provided for review. Automated exposure control, iterative reconstruction, and/or weight-based adjustment of the mA/kV was utilized to reduce the radiation dose to as low as reasonably achievable. COMPARISON: 09/04/2023 CLINICAL HISTORY: Left upper and left lower quadrant abdominal pain. FINDINGS: LOWER CHEST: Scarring at the left lung base. LIVER: The liver is unremarkable. GALLBLADDER AND BILE DUCTS: Small layering gallstones within the gallbladder. No biliary  ductal dilatation. SPLEEN: No acute abnormality. PANCREAS: No acute abnormality. ADRENAL GLANDS: A low-density nodule in the right adrenal gland measures 2.5 cm, stable. Most compatible with adenoma. Diffuse enlargement of both adrenal glands suggests hyperplasia. KIDNEYS, URETERS AND BLADDER: Punctate bilateral nephrolithiasis. No hydronephrosis. No perinephric or periureteral stranding. Air within the urinary bladder, presumably from recent catheterization. GI AND BOWEL: Stomach demonstrates no acute abnormality. Large stool burden throughout the colon. Normal appendix. There is no bowel obstruction. PERITONEUM AND RETROPERITONEUM: No ascites. No free air. VASCULATURE: Prior stent graft repair of abdominal aortic  aneurysm. Aneurysm sac size measures up to 6.3 cm, stable since prior study. LYMPH NODES: No lymphadenopathy. REPRODUCTIVE ORGANS: No acute abnormality. BONES AND SOFT TISSUES: No acute osseous abnormality. No focal soft tissue abnormality. IMPRESSION: 1. No acute findings. 2. Stable aneurysm sac size of 6.3 cm following prior stent graft repair of abdominal aortic aneurysm. 3. Bilateral nephrolithiasis. 4. Cholelithiasis. 5. Large stool burden throughout the colon. Electronically signed by: Kevin Dover MD 02/02/2024 06:31 PM EST RP Workstation: [id number]        Scheduled Meds:  atorvastatin   80 mg Oral QHS   enoxaparin  (LOVENOX ) injection  40 mg Subcutaneous Q24H   insulin  aspart  0-5 Units Subcutaneous QHS   insulin  aspart  0-9 Units Subcutaneous TID WC   metoprolol  tartrate  25 mg Oral Q0600   pantoprazole   80 mg Oral Daily   polyethylene glycol  17 g Oral Daily   Continuous Infusions:  cefTRIAXone  (ROCEPHIN )  IV            Bali Lyn, MD [facility] 02/03/2024, 3:49 PM

## 2024-02-04 DIAGNOSIS — N3000 Acute cystitis without hematuria: Secondary | ICD-10-CM | POA: Diagnosis not present

## 2024-02-04 LAB — GLUCOSE, CAPILLARY
Glucose-Capillary: 197 mg/dL — ABNORMAL HIGH (ref 70–99)
Glucose-Capillary: 173 mg/dL — ABNORMAL HIGH (ref 70–99)

## 2024-02-04 MED ORDER — LINACLOTIDE 145 MCG PO CAPS: 145.0000 ug | ORAL_CAPSULE | Freq: Every day | ORAL | Status: DC

## 2024-02-04 MED ORDER — TRAZODONE HCL 100 MG PO TABS: 100.0000 mg | ORAL_TABLET | Freq: Every day | ORAL | Status: DC

## 2024-02-04 MED ORDER — CLOPIDOGREL BISULFATE 75 MG PO TABS
75.0000 mg | ORAL_TABLET | Freq: Every day | ORAL | Status: DC
  Administered 2024-02-04: 75 mg via ORAL
  Filled 2024-02-04: qty 1

## 2024-02-04 MED ORDER — CLOZAPINE 100 MG PO TABS
500.0000 mg | ORAL_TABLET | Freq: Every day | ORAL | Status: DC
  Filled 2024-02-04: qty 5

## 2024-02-04 MED ORDER — TAMSULOSIN HCL 0.4 MG PO CAPS: 0.4000 mg | ORAL_CAPSULE | Freq: Two times a day (BID) | ORAL | Status: DC

## 2024-02-04 MED ORDER — PRIMIDONE 50 MG PO TABS
50.0000 mg | ORAL_TABLET | Freq: Every day | ORAL | Status: DC
  Administered 2024-02-04: 50 mg via ORAL
  Filled 2024-02-04: qty 1

## 2024-02-04 MED ORDER — FINASTERIDE 5 MG PO TABS
5.0000 mg | ORAL_TABLET | Freq: Every day | ORAL | Status: DC
  Administered 2024-02-04: 5 mg via ORAL
  Filled 2024-02-04: qty 1

## 2024-02-04 MED ORDER — CIPROFLOXACIN HCL 500 MG PO TABS: 500.0000 mg | ORAL_TABLET | Freq: Two times a day (BID) | ORAL | 0 refills | Status: AC

## 2024-02-04 MED ORDER — CLOZAPINE 100 MG PO TABS
100.0000 mg | ORAL_TABLET | Freq: Every day | ORAL | Status: DC
  Administered 2024-02-04: 100 mg via ORAL
  Filled 2024-02-04: qty 1

## 2024-02-04 MED ORDER — GABAPENTIN 300 MG PO CAPS
300.0000 mg | ORAL_CAPSULE | Freq: Three times a day (TID) | ORAL | Status: DC
  Administered 2024-02-04: 300 mg via ORAL
  Filled 2024-02-04: qty 1

## 2024-02-04 MED ORDER — CIPROFLOXACIN HCL 500 MG PO TABS: 500.0000 mg | ORAL_TABLET | Freq: Two times a day (BID) | ORAL | 0 refills | Status: DC

## 2024-02-04 MED ORDER — LINAGLIPTIN 5 MG PO TABS
5.0000 mg | ORAL_TABLET | Freq: Every day | ORAL | Status: DC
  Administered 2024-02-04: 5 mg via ORAL
  Filled 2024-02-04 (×2): qty 1

## 2024-02-04 MED ORDER — TOPIRAMATE 25 MG PO TABS
25.0000 mg | ORAL_TABLET | Freq: Two times a day (BID) | ORAL | Status: DC
  Administered 2024-02-04: 25 mg via ORAL
  Filled 2024-02-04 (×2): qty 1

## 2024-02-04 MED ORDER — GLYCOPYRROLATE 1 MG PO TABS
1.0000 mg | ORAL_TABLET | Freq: Two times a day (BID) | ORAL | Status: DC
  Administered 2024-02-04: 1 mg via ORAL
  Filled 2024-02-04 (×2): qty 1

## 2024-02-04 MED ORDER — LATANOPROST 0.005 % OP SOLN
1.0000 [drp] | Freq: Every day | OPHTHALMIC | Status: DC
  Filled 2024-02-04: qty 2.5

## 2024-02-04 MED ORDER — BUSPIRONE HCL 10 MG PO TABS
10.0000 mg | ORAL_TABLET | Freq: Two times a day (BID) | ORAL | Status: DC
  Administered 2024-02-04: 10 mg via ORAL
  Filled 2024-02-04: qty 1

## 2024-02-04 NOTE — Progress Notes (Signed)
 PT Cancellation Note  Patient Details Name: Oscar Elliott MRN: [id number] DOB: Dec 29, 1960   Cancelled Treatment:    Reason Eval/Treat Not Completed: PT screened, no needs identified, will sign off (Chart reviewed, RN/OT consulted. Pt is at base, has no need for PT evlauation at this time, will sign off.)  2:57 PM, 02/04/24 Peggye JAYSON Linear, PT, DPT Physical Therapist - [facility]  [phone] (ASCOM)    Dalbert Stillings C 02/04/2024, 2:56 PM

## 2024-02-04 NOTE — Plan of Care (Signed)

## 2024-02-04 NOTE — Plan of Care (Signed)
  Problem: Skin Integrity: Goal: Risk for impaired skin integrity will decrease Outcome: Progressing   Problem: Clinical Measurements: Goal: Will remain free from infection Outcome: Progressing Goal: Respiratory complications will improve Outcome: Progressing   Problem: Coping: Goal: Level of anxiety will decrease Outcome: Progressing   Problem: Pain Managment: Goal: General experience of comfort will improve and/or be controlled Outcome: Progressing   Problem: Safety: Goal: Ability to remain free from injury will improve Outcome: Progressing

## 2024-02-04 NOTE — Evaluation (Signed)
 Occupational Therapy Evaluation Patient Details Name: Oscar Elliott MRN: [id number] DOB: 1960/11/02 Today's Date: 02/04/2024   History of Present Illness   63 y.o. male with medical history significant of hypertension, hyperlipidemia, T2DM, CKD stage IIIA who presents to the emergency department due to abdominal pain and 1 month onset of diarrhea with no blood.  He complained of intermittent lightheadedness and denies chest pain, shortness of breath, falls, back pain.  EMS was activated and on arrival of EMS team, blood glucose was 27, oral glucose was given with improvement in blood glucose to 136.  Patient recently had AAA s/p repair in July. Pt with new UTI.     Clinical Impressions Upon entering the room, pt supine in bed and agreeable to OT intervention. Pt reports living at [facility] which is a group home. He endorses being Ind in self care tasks. Pt primarily is wheelchair level and is able to transfer himself at baseline. He does not have a prosthesis at baseline. He did report he could use RW to hop in hallway but had not done that in some time. Pt demonstrates ability to perform bed mobility without assistance. Pt transfers self to recliner chair with squat pivot without assistance. He declines sitting up and transfers self back to bed this session. Pt asking questions of therapist about getting a power wheelchair and where can I get some chewing tobacco from? Pt does not need skilled acute OT at this time. OT to sign off and complete orders at this time.       Functional Status Assessment   Patient has not had a recent decline in their functional status     Equipment Recommendations   None recommended by OT      Precautions/Restrictions   Precautions Precautions: Fall     Mobility Bed Mobility Overal bed mobility: Modified Independent             General bed mobility comments: increased time and effort    Transfers Overall transfer level:  Modified independent Equipment used: None               General transfer comment: squat pivot hospital bed <>recliner chair      Balance Overall balance assessment: Modified Independent                                         ADL either performed or assessed with clinical judgement   ADL Overall ADL's : At baseline                                             Vision Patient Visual Report: No change from baseline              Pertinent Vitals/Pain Pain Assessment Pain Assessment: 0-10 Pain Score: 8  Pain Location: headache Pain Descriptors / Indicators: Aching Pain Intervention(s): Monitored during session     Extremity/Trunk Assessment Upper Extremity Assessment Upper Extremity Assessment: Overall WFL for tasks assessed   Lower Extremity Assessment Lower Extremity Assessment: Defer to PT evaluation       Communication Communication Communication: Impaired Factors Affecting Communication: Reduced clarity of speech   Cognition Arousal: Alert Behavior During Therapy: WFL for tasks assessed/performed Cognition: No family/caregiver present to determine baseline  Following commands: Impaired Following commands impaired: Only follows one step commands consistently, Follows one step commands with increased time     Cueing  General Comments   Cueing Techniques: Verbal cues              Home Living Family/patient expects to be discharged to:: Other (Comment)                                 Additional Comments: Pt lives in [facility] center which he reports is a group home.      Prior Functioning/Environment Prior Level of Function : Needs assist             Mobility Comments: Per pt, he does not have a prosthesis but normally transfer to wheelchair for mobility. He also states he can hop down the hall with walker but I haven't in awhile ADLs  Comments: Pt endorses being Ind in self care and staff assisting with meals and medications     AM-PAC OT 6 Clicks Daily Activity     Outcome Measure Help from another person eating meals?: None Help from another person taking care of personal grooming?: None Help from another person toileting, which includes using toliet, bedpan, or urinal?: None Help from another person bathing (including washing, rinsing, drying)?: None Help from another person to put on and taking off regular upper body clothing?: None Help from another person to put on and taking off regular lower body clothing?: None 6 Click Score: 24   End of Session Nurse Communication: Mobility status  Activity Tolerance: Patient tolerated treatment well Patient left: in bed;with call bell/phone within reach;with bed alarm set                   Time: 9184-9164 OT Time Calculation (min): 20 min Charges:  OT General Charges $OT Visit: 1 Visit OT Evaluation $OT Eval Moderate Complexity: 1 Mod OT Treatments $Therapeutic Activity: 8-22 mins  Izetta Claude, MS, OTR/L , CBIS ascom [phone]  02/04/24, 9:58 AM

## 2024-02-04 NOTE — TOC Initial Note (Signed)
 Transition of Care [facility]) - Initial/Assessment Note    Patient Details  Name: Oscar Elliott MRN: [id number] Date of Birth: [birth year]  Transition of Care [facility]) CM/SW Contact:    Corean ONEIDA Haddock, RN Phone Number: 02/04/2024, 1:47 PM  Clinical Narrative:                    Patient admitted from [facility] home Per [place] at [facility] home at baseline patient stands and pivots independently, he requires assistance with ADLs  Patient to return to [place] care home today Fl2 and DC summary secure emailed to [facility] Bedside RN provided number to call report, when bedside RN calls report Lennette to arrange cab transportation     Patient Goals and CMS Choice            Expected Discharge Plan and Services         Expected Discharge Date: 02/04/24                                    Prior Living Arrangements/Services                       Activities of Daily Living   ADL Screening (condition at time of admission) Independently performs ADLs?: No Does the patient have a NEW difficulty with bathing/dressing/toileting/self-feeding that is expected to last >3 days?: No Does the patient have a NEW difficulty with getting in/out of bed, walking, or climbing stairs that is expected to last >3 days?: No Does the patient have a NEW difficulty with communication that is expected to last >3 days?: No Is the patient deaf or have difficulty hearing?: No Does the patient have difficulty seeing, even when wearing glasses/contacts?: No Does the patient have difficulty concentrating, remembering, or making decisions?: Yes  Permission Sought/Granted                  Emotional Assessment              Admission diagnosis:  UTI (urinary tract infection) [N39.0] Hypoglycemia [E16.2] Troponin level elevated [R79.89] Urinary tract infection, acute [N39.0] Abdominal pain of multiple sites [R10.85] Diarrhea, unspecified type [R19.7] Patient  Active Problem List   Diagnosis Date Noted   UTI (urinary tract infection) 02/02/2024   AAA (abdominal aortic aneurysm) 09/04/2023   Oropharyngeal dysphagia 04/30/2022   Normocytic anemia 04/30/2022   Thrombocytopenia 04/30/2022   Physical deconditioning 04/30/2022   Altered mental status 04/25/2022   AKI (acute kidney injury) 04/25/2022   Acute metabolic encephalopathy 04/25/2022   History of seizure disorder 04/25/2022   Sepsis (HCC) 04/23/2022   Constipation 06/16/2019   Polypharmacy 03/21/2018   Hypoglycemia 05/30/2016   Insulin  dependent type 2 diabetes mellitus (HCC) 10/25/2015   Encounter for screening colonoscopy 04/21/2014   Cholelithiases 04/21/2014   Hemorrhoids 04/21/2014   Elevated troponin 02/24/2014   Nausea and vomiting 02/23/2014   Abdominal pain    C. difficile colitis    Hypokalemia    Vomiting and diarrhea 02/22/2014   CAD -S/P RCA DES Sept 2015 12/02/2013   Hyperlipidemia 12/02/2013   Tobacco abuse    Peripheral vascular disease    Schizophrenia (HCC)    Essential hypertension    PCP:  Patient, No Pcp Per Pharmacy:   [facility], [place] - [address] [place] [address] Phone: [phone] Fax: [phone]  [facility] [employer] [facility]  [place], [place] - [address] [place] [address] Phone: [phone] Fax: [phone]     Social Drivers of Health (SDOH) Social History: SDOH Screenings   Food Insecurity: No Food Insecurity (02/03/2024)  Housing: Low Risk  (02/03/2024)  Transportation Needs: No Transportation Needs (02/03/2024)  Utilities: Not At Risk (02/03/2024)  Social Connections: Unknown (09/04/2023)  Tobacco Use: High Risk (02/02/2024)   SDOH Interventions:     Readmission Risk Interventions     No data to display

## 2024-02-04 NOTE — Discharge Summary (Signed)
 Physician Discharge Summary   Patient: Oscar Elliott MRN: [id number] DOB: Jul 07, 1960  Admit date:     02/02/2024  Discharge date: 02/04/24  Discharge Physician: Deliliah Room   PCP: Patient, No Pcp Per   Recommendations at discharge:    F/u with your PCP in one week Continue taking meds as prescribed  Discharge Diagnoses: Principal Problem:   UTI (urinary tract infection) Active Problems:   Tobacco abuse   Schizophrenia (HCC)   Hyperlipidemia   Abdominal pain   Elevated troponin   Insulin  dependent type 2 diabetes mellitus (HCC)   Constipation   AAA (abdominal aortic aneurysm)   Hospital Course:  63 y.o. male with medical history significant of hypertension, hyperlipidemia, T2DM, CKD stage IIIA who presents to the emergency department due to abdominal pain and 1 month onset of diarrhea with no blood.  He complained of intermittent lightheadedness and denies chest pain, shortness of breath, falls, back pain.  EMS was activated and on arrival of EMS team, blood glucose was 27, oral glucose was given with improvement in blood glucose to 136.  Patient recently had AAA s/p repair in July.   Abdominal pain likely exacerbated by severe constipation  as seen in CT scan This appears to be acute on chronic abdominal pain Constipation resolved Received IV hydration  Continue Zofran  p.r.n. Continue MiraLAX     presumed acute uncomplicated UTI secondary to citrobacter, POA Patient was started on IV ceftriaxone , dced on oral ciprofloxacin  for 5 days Afebrile and no leukocytosis at the time of the discharge.   Type 2 diabetes mellitus with hypoglycemia-resolved Glucose 27 per EMS.  Subsequent readings in the hospital have been in 120s-150s This may be due to poor oral intake in the setting of abdominal pain Hemoglobin A1c in July 2025 was 5.8, Repeat A1c is 7.5%   Elevated troponin possibly secondary to type II demand ischemia Troponin 112 > 115, patient denies chest pain EKG  personally reviewed showed normal sinus rhythm at rate of 90 bpm and showed no significant change from ECG done in July 2025   Essential hypertension Continue Lopressor    CKD 3B Stable. Renally adjust medications, avoid nephrotoxic agents/dehydration/hypotension   Mixed hyperlipidemia Continue Lipitor    GERD Continue Protonix   Disposition: [facility]     Consultants: None Procedures performed: None  Disposition: Skilled nursing facility Diet recommendation:  Cardiac diet DISCHARGE MEDICATION: Allergies as of 02/04/2024       Reactions   Penicillins Other (See Comments)   DIZZINESS         Medication List     STOP taking these medications    cefUROXime  250 MG tablet Commonly known as: CEFTIN        TAKE these medications    acetaminophen  500 MG tablet Commonly known as: TYLENOL  Take 500 mg by mouth every 4 (four) hours as needed for mild pain (pain score 1-3).   aspirin  EC 81 MG tablet Take 81 mg by mouth daily. Swallow whole.   atorvastatin  80 MG tablet Commonly known as: LIPITOR  TAKE ONE TABLET BY MOUTH AT BEDTIME   atropine 1 % ophthalmic solution Place 1-2 drops under the tongue every 4 (four) hours as needed (secretions).   busPIRone  10 MG tablet Commonly known as: BUSPAR  Take 10 mg by mouth 2 (two) times daily.   ciprofloxacin  500 MG tablet Commonly known as: Cipro  Take 1 tablet (500 mg total) by mouth 2 (two) times daily for 10 days.   clopidogrel  75 MG tablet Commonly known as: PLAVIX  Take  75 mg by mouth daily.   cloZAPine  100 MG tablet Commonly known as: CLOZARIL  100 mg in AM and 500 mg at night What changed:  how much to take how to take this when to take this additional instructions   cyanocobalamin  1000 MCG tablet Take 1 tablet (1,000 mcg total) by mouth daily.   docusate sodium  100 MG capsule Commonly known as: COLACE Take 100 mg by mouth 2 (two) times daily.   EasyMax Test test strip Generic drug: glucose  blood 1 each by Other route as needed.   feeding supplement Liqd Take 237 mLs by mouth 2 (two) times daily with a meal. What changed:  when to take this reasons to take this   finasteride  5 MG tablet Commonly known as: PROSCAR  Take 5 mg by mouth daily.   gabapentin  300 MG capsule Commonly known as: NEURONTIN  Take 300 mg by mouth 3 (three) times daily.   glycopyrrolate  1 MG tablet Commonly known as: ROBINUL  Take 1 mg by mouth 2 (two) times daily.   Januvia 25 MG tablet Generic drug: sitaGLIPtin Take 25 mg by mouth daily.   ketoconazole 2 % shampoo Commonly known as: NIZORAL Apply 1 application topically 2 (two) times a week.   latanoprost  0.005 % ophthalmic solution Commonly known as: XALATAN  Place 1 drop into both eyes at bedtime.   Linzess  145 MCG Caps capsule Generic drug: linaclotide  TAKE 1 CAPSULE BY MOUTH ONCE DAILY BEFORE BREAKFAST. What changed: See the new instructions.   loperamide 2 MG capsule Commonly known as: IMODIUM Take 2 mg by mouth as needed for diarrhea or loose stools.   metoprolol  tartrate 25 MG tablet Commonly known as: LOPRESSOR  TAKE ONE TABLET BY MOUTH TWICE DAILY   multivitamin with minerals Tabs tablet Take 1 tablet by mouth daily.   nitroGLYCERIN  0.4 MG SL tablet Commonly known as: NITROSTAT  Place 0.4 mg under the tongue every 5 (five) minutes x 3 doses as needed for chest pain (if no relief after 3rd dose, procee to ED or call 911).   omeprazole 40 MG capsule Commonly known as: PRILOSEC Take 40 mg by mouth 2 (two) times daily.   polyethylene glycol 17 g packet Commonly known as: MIRALAX  / GLYCOLAX  Take 17 g by mouth daily as needed for moderate constipation. What changed: reasons to take this   primidone  50 MG tablet Commonly known as: MYSOLINE  Take 50 mg by mouth daily.   senna 8.6 MG tablet Commonly known as: SENOKOT Take 2 tablets by mouth at bedtime.   tamsulosin  0.4 MG Caps capsule Commonly known as: FLOMAX  Take  0.4 mg by mouth 2 (two) times daily.   thiamine  100 MG tablet Commonly known as: Vitamin B-1 Take 1 tablet (100 mg total) by mouth daily.   timolol 0.5 % ophthalmic solution Commonly known as: TIMOPTIC Place 1 drop into both eyes as directed.   topiramate  25 MG tablet Commonly known as: TOPAMAX  Take 1 tablet (25 mg total) by mouth 2 (two) times daily.   traZODone  50 MG tablet Commonly known as: DESYREL  Take 50 mg by mouth at bedtime.   traZODone  100 MG tablet Commonly known as: DESYREL  Take 100 mg by mouth at bedtime.   Tresiba 100 UNIT/ML Soln Generic drug: Insulin  Degludec Inject 12 Units into the skin at bedtime.   Vitamin D  50 MCG (2000 UT) Caps Take 2,000 Units by mouth daily at 12 noon.        Discharge Exam: Filed Weights   02/02/24 1317  Weight: 81.6  kg   General: Awake and alert and oriented x2. Not in any acute distress.  HEENT: NCAT.  PERRLA. EOMI. Sclerae anicteric.  Moist mucosal membranes. Neck: Neck supple without lymphadenopathy. No carotid bruits. No masses palpated.  Cardiovascular: Regular rate with normal S1-S2 sounds. No murmurs, rubs or gallops auscultated. No JVD.  Respiratory: Clear breath sounds.  No accessory muscle use. Abdomen: Soft, mild tenderness to palpation without guarding.,mildly distended. Active bowel sounds. No masses or hepatosplenomegaly  Skin: No rashes, lesions, or ulcerations.  Dry, warm to touch. Musculoskeletal: Left AKA.  2+ dorsalis pedis and radial pulses.  Neurologic: No focal neurological deficits. Strength is 5/5 x 4.  CN II - XII grossly intact.  Condition at discharge: good  The results of significant diagnostics from this hospitalization (including imaging, microbiology, ancillary and laboratory) are listed below for reference.   Imaging Studies: CT ABDOMEN PELVIS W CONTRAST Result Date: 02/02/2024 EXAM: CT ABDOMEN AND PELVIS WITH CONTRAST 02/02/2024 06:10:51 PM TECHNIQUE: CT of the abdomen and pelvis was  performed with the administration of intravenous contrast. 80 mL of iohexol  (OMNIPAQUE ) 300 MG/ML solution was administered. Multiplanar reformatted images are provided for review. Automated exposure control, iterative reconstruction, and/or weight-based adjustment of the mA/kV was utilized to reduce the radiation dose to as low as reasonably achievable. COMPARISON: 09/04/2023 CLINICAL HISTORY: Left upper and left lower quadrant abdominal pain. FINDINGS: LOWER CHEST: Scarring at the left lung base. LIVER: The liver is unremarkable. GALLBLADDER AND BILE DUCTS: Small layering gallstones within the gallbladder. No biliary ductal dilatation. SPLEEN: No acute abnormality. PANCREAS: No acute abnormality. ADRENAL GLANDS: A low-density nodule in the right adrenal gland measures 2.5 cm, stable. Most compatible with adenoma. Diffuse enlargement of both adrenal glands suggests hyperplasia. KIDNEYS, URETERS AND BLADDER: Punctate bilateral nephrolithiasis. No hydronephrosis. No perinephric or periureteral stranding. Air within the urinary bladder, presumably from recent catheterization. GI AND BOWEL: Stomach demonstrates no acute abnormality. Large stool burden throughout the colon. Normal appendix. There is no bowel obstruction. PERITONEUM AND RETROPERITONEUM: No ascites. No free air. VASCULATURE: Prior stent graft repair of abdominal aortic aneurysm. Aneurysm sac size measures up to 6.3 cm, stable since prior study. LYMPH NODES: No lymphadenopathy. REPRODUCTIVE ORGANS: No acute abnormality. BONES AND SOFT TISSUES: No acute osseous abnormality. No focal soft tissue abnormality. IMPRESSION: 1. No acute findings. 2. Stable aneurysm sac size of 6.3 cm following prior stent graft repair of abdominal aortic aneurysm. 3. Bilateral nephrolithiasis. 4. Cholelithiasis. 5. Large stool burden throughout the colon. Electronically signed by: Franky Crease MD 02/02/2024 06:31 PM EST RP Workstation: [id number]    Microbiology: Results for  orders placed or performed during the hospital encounter of 02/02/24  Urine Culture     Status: Abnormal (Preliminary result)   Collection Time: 02/02/24  5:22 PM   Specimen: Urine, Random  Result Value Ref Range Status   Specimen Description   Final    URINE, RANDOM Performed at [facility], [address] [place], [place] [address]    Special Requests   Final    NONE Reflexed from [id number] Performed at [facility], [address] [place], [place] [address]    Culture (A)  Final    >=100,000 COLONIES/mL CITROBACTER KOSERI SUSCEPTIBILITIES TO FOLLOW Performed at [facility], [address], [place], [place] [address]    Report Status PENDING  Incomplete  C Difficile Quick Screen w PCR reflex     Status: None   Collection Time: 02/02/24 10:46 PM   Specimen:  STOOL  Result Value Ref Range Status   C Diff antigen NEGATIVE NEGATIVE Final   C Diff toxin NEGATIVE NEGATIVE Final   C Diff interpretation No C. difficile detected.  Final    Comment: Performed at [facility], [address] [place], [place] [address]  Gastrointestinal Panel by PCR , Stool     Status: None   Collection Time: 02/02/24 10:46 PM   Specimen: Stool  Result Value Ref Range Status   Campylobacter species NOT DETECTED NOT DETECTED Final   Plesimonas shigelloides NOT DETECTED NOT DETECTED Final   Salmonella species NOT DETECTED NOT DETECTED Final   Yersinia enterocolitica NOT DETECTED NOT DETECTED Final   Vibrio species NOT DETECTED NOT DETECTED Final   Vibrio cholerae NOT DETECTED NOT DETECTED Final   Enteroaggregative E coli (EAEC) NOT DETECTED NOT DETECTED Final   Enteropathogenic E coli (EPEC) NOT DETECTED NOT DETECTED Final   Enterotoxigenic E coli (ETEC) NOT DETECTED NOT DETECTED Final   Shiga like toxin producing E coli (STEC) NOT DETECTED NOT DETECTED Final   Shigella/Enteroinvasive E coli (EIEC) NOT DETECTED NOT DETECTED Final   Cryptosporidium NOT DETECTED NOT  DETECTED Final   Cyclospora cayetanensis NOT DETECTED NOT DETECTED Final   Entamoeba histolytica NOT DETECTED NOT DETECTED Final   Giardia lamblia NOT DETECTED NOT DETECTED Final   Adenovirus F40/41 NOT DETECTED NOT DETECTED Final   Astrovirus NOT DETECTED NOT DETECTED Final   Norovirus GI/GII NOT DETECTED NOT DETECTED Final   Rotavirus A NOT DETECTED NOT DETECTED Final   Sapovirus (I, II, IV, and V) NOT DETECTED NOT DETECTED Final    Comment: Performed at [facility], [address] [place], [place] [address]    Labs: CBC: Recent Labs  Lab 02/02/24 1408 02/02/24 2246 02/03/24 0538  WBC 6.6 5.7 5.4  NEUTROABS 4.3  --   --   HGB 13.0 12.2* 11.6*  HCT 40.6 37.1* 35.3*  MCV 85.5 85.9 85.3  PLT 123* 117* 134*   Basic Metabolic Panel: Recent Labs  Lab 02/02/24 1408 02/02/24 2246 02/03/24 0226  NA 143  --  142  K 4.0  --  3.4*  CL 111  --  111  CO2 21*  --  21*  GLUCOSE 176*  --  106*  BUN 15  --  13  CREATININE 1.74* 1.54* 1.51*  CALCIUM  9.2  --  8.8*  MG  --   --  1.9  PHOS  --   --  2.3*   Liver Function Tests: Recent Labs  Lab 02/02/24 1408 02/03/24 0226  AST 33 23  ALT 20 17  ALKPHOS 107 99  BILITOT 0.3 0.2  PROT 7.0 6.4*  ALBUMIN 4.0 3.7   CBG: Recent Labs  Lab 02/03/24 1618 02/03/24 1714 02/03/24 2121 02/04/24 0807 02/04/24 1155  GLUCAP 184* 173* 146* 197* 173*    Discharge time spent: 40 minutes.  Signed: Deliliah Room, MD [facility] 02/04/2024

## 2024-02-04 NOTE — [place] FL2 (Signed)
 Oscar Elliott  MEDICAID FL2 LEVEL OF CARE FORM     IDENTIFICATION  Patient Name: Oscar Elliott Birthdate: 05-23-1960 Sex: male Admission Date (Current Location): 02/02/2024  [place] Number:  Chiropodist and Address:         Provider Number: [id number]  Attending Physician Name and Address:  Dino Antu, MD  Relative Name and Phone Number:       Current Level of Care: Hospital Recommended Level of Care: Assisted Living Facility Prior Approval Number:    Date Approved/Denied:   PASRR Number:    Discharge Plan: Other (Comment) (ALF)    Current Diagnoses: Patient Active Problem List   Diagnosis Date Noted   UTI (urinary tract infection) 02/02/2024   AAA (abdominal aortic aneurysm) 09/04/2023   Oropharyngeal dysphagia 04/30/2022   Normocytic anemia 04/30/2022   Thrombocytopenia 04/30/2022   Physical deconditioning 04/30/2022   Altered mental status 04/25/2022   AKI (acute kidney injury) 04/25/2022   Acute metabolic encephalopathy 04/25/2022   History of seizure disorder 04/25/2022   Sepsis (HCC) 04/23/2022   Constipation 06/16/2019   Polypharmacy 03/21/2018   Hypoglycemia 05/30/2016   Insulin  dependent type 2 diabetes mellitus (HCC) 10/25/2015   Encounter for screening colonoscopy 04/21/2014   Cholelithiases 04/21/2014   Hemorrhoids 04/21/2014   Elevated troponin 02/24/2014   Nausea and vomiting 02/23/2014   Abdominal pain    C. difficile colitis    Hypokalemia    Vomiting and diarrhea 02/22/2014   CAD -S/P RCA DES Sept 2015 12/02/2013   Hyperlipidemia 12/02/2013   Tobacco abuse    Peripheral vascular disease    Schizophrenia (HCC)    Essential hypertension     Orientation RESPIRATION BLADDER Height & Weight     Self, Place  Normal Continent Weight: 81.6 kg Height:  5' 1 (154.9 cm)  BEHAVIORAL SYMPTOMS/MOOD NEUROLOGICAL BOWEL NUTRITION STATUS      Continent Diet (Cardiac)  AMBULATORY STATUS COMMUNICATION OF NEEDS Skin    Limited Assist (Transfer to [facility]) Verbally Normal                       Personal Care Assistance Level of Assistance              Functional Limitations Info             SPECIAL CARE FACTORS FREQUENCY                       Contractures Contractures Info: Not present    Additional Factors Info  Code Status, Allergies Code Status Info: Full Allergies Info: Penicillins           STOP taking these medications     cefUROXime  250 MG tablet Commonly known as: CEFTIN            TAKE these medications     acetaminophen  500 MG tablet Commonly known as: TYLENOL  Take 500 mg by mouth every 4 (four) hours as needed for mild pain (pain score 1-3).    aspirin  EC 81 MG tablet Take 81 mg by mouth daily. Swallow whole.    atorvastatin  80 MG tablet Commonly known as: LIPITOR  TAKE ONE TABLET BY MOUTH AT BEDTIME    atropine 1 % ophthalmic solution Place 1-2 drops under the tongue every 4 (four) hours as needed (secretions).    busPIRone  10 MG tablet Commonly known as: BUSPAR  Take 10 mg by mouth 2 (two) times daily.    ciprofloxacin  500 MG tablet Commonly known  as: Cipro  Take 1 tablet (500 mg total) by mouth 2 (two) times daily for 10 days.    clopidogrel  75 MG tablet Commonly known as: PLAVIX  Take 75 mg by mouth daily.    cloZAPine  100 MG tablet Commonly known as: CLOZARIL  100 mg in AM and 500 mg at night What changed:  how much to take how to take this when to take this additional instructions    cyanocobalamin  1000 MCG tablet Take 1 tablet (1,000 mcg total) by mouth daily.    docusate sodium  100 MG capsule Commonly known as: COLACE Take 100 mg by mouth 2 (two) times daily.    EasyMax Test test strip Generic drug: glucose blood 1 each by Other route as needed.    feeding supplement Liqd Take 237 mLs by mouth 2 (two) times daily with a meal. What changed:  when to take this reasons to take this    finasteride  5 MG tablet Commonly  known as: PROSCAR  Take 5 mg by mouth daily.    gabapentin  300 MG capsule Commonly known as: NEURONTIN  Take 300 mg by mouth 3 (three) times daily.    glycopyrrolate  1 MG tablet Commonly known as: ROBINUL  Take 1 mg by mouth 2 (two) times daily.    Januvia 25 MG tablet Generic drug: sitaGLIPtin Take 25 mg by mouth daily.    ketoconazole 2 % shampoo Commonly known as: NIZORAL Apply 1 application topically 2 (two) times a week.    latanoprost  0.005 % ophthalmic solution Commonly known as: XALATAN  Place 1 drop into both eyes at bedtime.    Linzess  145 MCG Caps capsule Generic drug: linaclotide  TAKE 1 CAPSULE BY MOUTH ONCE DAILY BEFORE BREAKFAST. What changed: See the new instructions.    loperamide 2 MG capsule Commonly known as: IMODIUM Take 2 mg by mouth as needed for diarrhea or loose stools.    metoprolol  tartrate 25 MG tablet Commonly known as: LOPRESSOR  TAKE ONE TABLET BY MOUTH TWICE DAILY    multivitamin with minerals Tabs tablet Take 1 tablet by mouth daily.    nitroGLYCERIN  0.4 MG SL tablet Commonly known as: NITROSTAT  Place 0.4 mg under the tongue every 5 (five) minutes x 3 doses as needed for chest pain (if no relief after 3rd dose, procee to ED or call 911).    omeprazole 40 MG capsule Commonly known as: PRILOSEC Take 40 mg by mouth 2 (two) times daily.    polyethylene glycol 17 g packet Commonly known as: MIRALAX  / GLYCOLAX  Take 17 g by mouth daily as needed for moderate constipation. What changed: reasons to take this    primidone  50 MG tablet Commonly known as: MYSOLINE  Take 50 mg by mouth daily.    senna 8.6 MG tablet Commonly known as: SENOKOT Take 2 tablets by mouth at bedtime.    tamsulosin  0.4 MG Caps capsule Commonly known as: FLOMAX  Take 0.4 mg by mouth 2 (two) times daily.    thiamine  100 MG tablet Commonly known as: Vitamin B-1 Take 1 tablet (100 mg total) by mouth daily.    timolol 0.5 % ophthalmic solution Commonly known as:  TIMOPTIC Place 1 drop into both eyes as directed.    topiramate  25 MG tablet Commonly known as: TOPAMAX  Take 1 tablet (25 mg total) by mouth 2 (two) times daily.    traZODone  50 MG tablet Commonly known as: DESYREL  Take 50 mg by mouth at bedtime.    traZODone  100 MG tablet Commonly known as: DESYREL  Take 100 mg by mouth at  bedtime.    Tresiba 100 UNIT/ML Soln Generic drug: Insulin  Degludec Inject 12 Units into the skin at bedtime.    Vitamin D  50 MCG (2000 UT) Caps Take 2,000 Units by mouth daily at 12 noon.    Relevant Imaging Results:  Relevant Lab Results:   Additional Information SS#: [id number]  Corean ONEIDA Haddock, RN

## 2024-02-06 LAB — URINE CULTURE: Culture: >=100000 — AB

## 2024-02-27 ENCOUNTER — Other Ambulatory Visit: Payer: Self-pay | Admitting: Physician Assistant

## 2024-03-12 ENCOUNTER — Ambulatory Visit: Attending: Cardiology | Admitting: Cardiology

## 2024-03-20 ENCOUNTER — Other Ambulatory Visit (INDEPENDENT_AMBULATORY_CARE_PROVIDER_SITE_OTHER): Payer: Self-pay | Admitting: Nurse Practitioner

## 2024-03-20 DIAGNOSIS — I714 Abdominal aortic aneurysm, without rupture, unspecified: Secondary | ICD-10-CM

## 2024-03-25 ENCOUNTER — Ambulatory Visit (INDEPENDENT_AMBULATORY_CARE_PROVIDER_SITE_OTHER): Admitting: Nurse Practitioner

## 2024-03-25 ENCOUNTER — Other Ambulatory Visit (INDEPENDENT_AMBULATORY_CARE_PROVIDER_SITE_OTHER)

## 2024-12-24 ENCOUNTER — Ambulatory Visit: Admitting: Neurology
